# Patient Record
Sex: Male | Born: 1935 | Race: White | Hispanic: No | Marital: Married | Smoking: Former smoker
Health system: Southern US, Community
[De-identification: ages and names within clinical notes are randomized; demographics above are authoritative.]

## PROBLEM LIST (undated history)

## (undated) DIAGNOSIS — M810 Age-related osteoporosis without current pathological fracture: Secondary | ICD-10-CM

## (undated) DIAGNOSIS — R4189 Other symptoms and signs involving cognitive functions and awareness: Secondary | ICD-10-CM

## (undated) DIAGNOSIS — E039 Hypothyroidism, unspecified: Secondary | ICD-10-CM

## (undated) DIAGNOSIS — E079 Disorder of thyroid, unspecified: Secondary | ICD-10-CM

## (undated) HISTORY — PX: CHOLECYSTECTOMY: SHX55

## (undated) HISTORY — PX: HIP FRACTURE SURGERY: SHX118

## (undated) HISTORY — PX: SHOULDER SURGERY: SHX246

---

## 1999-04-02 ENCOUNTER — Encounter: Payer: Self-pay | Admitting: Emergency Medicine

## 1999-04-02 ENCOUNTER — Emergency Department (HOSPITAL_COMMUNITY): Admission: EM | Admit: 1999-04-02 | Discharge: 1999-04-02 | Payer: Self-pay | Admitting: Emergency Medicine

## 1999-04-06 ENCOUNTER — Emergency Department (HOSPITAL_COMMUNITY): Admission: EM | Admit: 1999-04-06 | Discharge: 1999-04-06 | Payer: Self-pay | Admitting: Emergency Medicine

## 2003-07-07 ENCOUNTER — Emergency Department (HOSPITAL_COMMUNITY): Admission: EM | Admit: 2003-07-07 | Discharge: 2003-07-07 | Payer: Self-pay | Admitting: Emergency Medicine

## 2011-12-04 DIAGNOSIS — C4490 Unspecified malignant neoplasm of skin, unspecified: Secondary | ICD-10-CM | POA: Insufficient documentation

## 2011-12-04 DIAGNOSIS — D580 Hereditary spherocytosis: Secondary | ICD-10-CM | POA: Insufficient documentation

## 2012-09-27 DIAGNOSIS — E039 Hypothyroidism, unspecified: Secondary | ICD-10-CM | POA: Insufficient documentation

## 2012-09-27 DIAGNOSIS — E559 Vitamin D deficiency, unspecified: Secondary | ICD-10-CM | POA: Insufficient documentation

## 2012-09-27 DIAGNOSIS — M810 Age-related osteoporosis without current pathological fracture: Secondary | ICD-10-CM | POA: Insufficient documentation

## 2013-01-20 DIAGNOSIS — B351 Tinea unguium: Secondary | ICD-10-CM | POA: Insufficient documentation

## 2013-11-01 DIAGNOSIS — M8440XA Pathological fracture, unspecified site, initial encounter for fracture: Secondary | ICD-10-CM | POA: Insufficient documentation

## 2014-08-08 DIAGNOSIS — Z860100 Personal history of colon polyps, unspecified: Secondary | ICD-10-CM | POA: Insufficient documentation

## 2014-11-06 DIAGNOSIS — Z8781 Personal history of (healed) traumatic fracture: Secondary | ICD-10-CM | POA: Insufficient documentation

## 2015-06-19 DIAGNOSIS — N401 Enlarged prostate with lower urinary tract symptoms: Secondary | ICD-10-CM | POA: Insufficient documentation

## 2015-06-19 DIAGNOSIS — E782 Mixed hyperlipidemia: Secondary | ICD-10-CM | POA: Insufficient documentation

## 2015-09-17 ENCOUNTER — Ambulatory Visit: Payer: Medicare Other | Attending: Orthopedic Surgery | Admitting: Physical Therapy

## 2015-09-17 DIAGNOSIS — R293 Abnormal posture: Secondary | ICD-10-CM | POA: Insufficient documentation

## 2015-09-17 DIAGNOSIS — D580 Hereditary spherocytosis: Secondary | ICD-10-CM | POA: Diagnosis present

## 2015-09-17 DIAGNOSIS — R269 Unspecified abnormalities of gait and mobility: Secondary | ICD-10-CM | POA: Diagnosis present

## 2015-09-17 DIAGNOSIS — R262 Difficulty in walking, not elsewhere classified: Secondary | ICD-10-CM

## 2015-09-17 DIAGNOSIS — M25552 Pain in left hip: Secondary | ICD-10-CM | POA: Diagnosis present

## 2015-09-17 DIAGNOSIS — Z96642 Presence of left artificial hip joint: Secondary | ICD-10-CM | POA: Diagnosis present

## 2015-09-17 DIAGNOSIS — R6889 Other general symptoms and signs: Secondary | ICD-10-CM | POA: Diagnosis present

## 2015-09-17 NOTE — Therapy (Signed)
[facility] [place], [place], [address] Phone: [phone]   Fax:  [phone]  Physical Therapy Evaluation  Patient Details  Name: Eric Larsen MRN: [id number] Date of Birth: 03-Feb-1931 Referring Provider: Marchia Bond MD  Encounter Date: 09/17/2015      PT End of Session - 09/17/15 1327    Visit Number 1   Number of Visits 16   Date for PT Re-Evaluation 11/12/15   Authorization Type BCBS Medicare   Authorization Time Period 11-12-15   PT Start Time 1017   PT Stop Time 1100   PT Time Calculation (min) 43 min   Activity Tolerance Patient tolerated treatment well   Behavior During Therapy [facility] for tasks assessed/performed      No past medical history on file.  No past surgical history on file.  There were no vitals filed for this visit.  Visit Diagnosis:  Spherocytosis (familial) ([place])  H/O total hip arthroplasty, left  Left hip pain  Difficulty walking up stairs  Abnormality of gait  Abnormal posture  Activity intolerance      Subjective Assessment - 09/17/15 1029    Subjective I fell on Aug 24, 2015  with subsequent fracture of Left humeral head and left  neck of femur.  I had no broken bone in my lift until this.     Pertinent History L THA on 08-25-15  and ORIF of Left shoulder on 08-25-15/ No order for shoulder for 6 weeks.    How long can you sit comfortably? 30 min   How long can you stand comfortably? 10  min   How long can you walk comfortably? 10 min   Diagnostic tests x ray, MRI   Patient Stated Goals I would like to walk without a limp and without an assistive device , be able to get up and down form chair   Currently in Pain? Yes   Pain Score 5   when moving otherwise 0/10   Pain Location Hip   Pain Orientation Left   Pain Descriptors / Indicators Aching   Pain Type Surgical pain   Pain Onset 1 to 4 weeks ago   Pain Frequency Occasional   Aggravating Factors  ADL's, sleeping  , moving the hip   Pain Relieving Factors rest   Multiple Pain Sites Yes   Pain Score 6  no order for shoulder   Pain Location Shoulder   Pain Orientation Left   Aggravating Factors  moving hip            [facility] PT Assessment - 09/17/15 1036    Assessment   Medical Diagnosis Left THA and Left Humeral head fx with ORIF   Referring Provider Marchia Bond MD   Onset Date/Surgical Date 08/25/15  surgery for both hip and left shoulder   Hand Dominance Right   [facility] in [facility]   Precautions   Precautions Posterior Hip   Restrictions   Weight Bearing Restrictions Yes   LLE Weight Bearing Weight bearing as tolerated   Balance Screen   Has the patient fallen in the past 6 months Yes   How many times? 1  [place]   Has the patient had a decrease in activity level because of a fear of falling?  Yes   Is the patient reluctant to leave their home because of a fear of falling?  No  Home Environment   Living Environment Private residence   Living Arrangements Spouse/significant other   Home Access Stairs to enter   Entrance Stairs-Number of Steps 3   Entrance Stairs-Rails Right   [place] Two level  bed and bath downsteps   Prior Function   Level of Independence Independent   Cognition   Overall Cognitive Status Within Functional Limits for tasks assessed   Observation/Other Assessments   Lower Extremity Functional Scale  19/80 or 76.2 limitation   Posture/Postural Control   Posture/Postural Control Postural limitations   Postural Limitations Flexed trunk;Posterior pelvic tilt;Rounded Shoulders;Forward head;Increased thoracic kyphosis;Decreased lumbar lordosis   AROM   Right Hip Flexion 110   Right Hip External Rotation  35   Right Hip Internal Rotation  25   Left Hip Flexion 88   Left Hip External Rotation  20  painful and liimited   Left Hip Internal  Rotation  0  painful and liimited   Right Knee Extension 5   Right Knee Flexion 140   Left Knee Extension 13   Left Knee Flexion 88   Strength   Right Hip Flexion 4/5   Right Hip Extension 4-/5   Right Hip ABduction 4-/5   Left Hip Flexion 3-/5   Left Hip Extension 3-/5   Left Hip ABduction 2/5   Right Knee Flexion 4-/5   Right Knee Extension 4-/5   Left Knee Flexion 3-/5   Left Knee Extension 3-/5   Flexibility   Hamstrings right 64, left 50    Palpation   Palpation comment pt with tenderness over surgical site on left posterior total hip   Transfers   Transfers Sit to Stand  bears right on sit to stand   Ambulation/Gait   Ambulation/Gait Yes   Ambulation/Gait Assistance 6: Modified independent (Device/Increase time)   Assistive device Hemi-walker   Gait Pattern Decreased weight shift to right;Decreased hip/knee flexion - left;Decreased step length - right;Decreased stance time - left   Gait velocity 1.17 ft/sec       Posture education for sitting and standing There Ex Eccentric SLR with left LE with wife's assistance and education and return demo for wife. 10 x  1 Heel slides with AAROM 10 x Quad set with towel under knee 10 x  sitting hamstring stretch LAQ x 10 , pt unable to complete Full AROM with knee lag                      PT Education - 09/17/15 1319    Education provided Yes   Education Details POC, Explanation of findings, initial HEP with sitting hamstiring.  Instruction on how wife can assist with exericise posture sitting and standing   Person(s) Educated Patient;Spouse   Methods Explanation;Demonstration;Tactile cues;Verbal cues;Handout   Comprehension Verbalized understanding;Returned demonstration;Need further instruction          PT Short Term Goals - 09/17/15 1341    PT SHORT TERM GOAL #1   Title "Independent with initial HEP   Time 4   Period Weeks   Status New   PT SHORT TERM GOAL #2   Title "Demonstrate and verbalize  understanding of condition management including RICE, positioning, use of A.D., HEP.    Time 4   Period Weeks   Status New   PT SHORT TERM GOAL #3   Title "Demonstrate understanding of proper sitting posture, body mechanics, work ergonomics, and be more conscious of position and posture throughout the day   Time 4  Period Weeks   Status New   PT SHORT TERM GOAL #4   Title Pt will decreased pain in left hip from 5/10 to 2/10 with movement   Time 4   Period Weeks   Status New   PT SHORT TERM GOAL #5   Title Pt will demonstrate 50% greater ease in rising from chair   Time 4   Period Weeks           PT Long Term Goals - 09/17/15 1343    PT LONG TERM GOAL #1   Title "Pt will be independent with advanced HEP.    Time 8   Period Weeks   Status New   PT LONG TERM GOAL #2   Title Pt will be able to negotiate stairs with LRAD and no increase in pain   Time 8   Period Weeks   Status New   PT LONG TERM GOAL #3   Title "Pt will tolerate standing and walking for 90 minutes  without increased pain in order to return to PLOF    Time 8   Period Weeks   Status New   PT LONG TERM GOAL #4   Title Pt will be at 1/10 pain or less with all functional activities and ADL's with left hip   Time 8   Period Weeks   Status New   PT LONG TERM GOAL #5   Title LEFS will improve to at least 50 % limtation or40/80   Baseline 19/80 or 76.2 % limitation   PT LONG TERM GOAL #6   Title Gait velocity will improve from 1.17 ft/sec to at least 2.62 ft/sec to show improved mobiility at community level   Time 8   Period Weeks   Status New               Plan - 09/17/15 1334    Clinical Impression Statement Pt is a  82  year old male s/p  THA on   1 /  7 /17 by MD in [facility] after a fall.  Pt underwent THA and ORIF for Left shoulder humeral head fx.inwhich he wears a sling and is immobile for 6 weeks. (NO order for LEft shoulder - to be assessed when order and MD deems ready.   Dr. Marchia Bond is not resuming care for Mr. Code in [place].  Pt had Posterior approach with precautians and WBAT with Hemi walker. Pt presents with impairments including pain, knee weakness, decreased ROM in hips and kneees, difficulty with walking, stairs, and with transfers . Pt would benefit from skilled PT for 2 times a week for 8 weeks to address above impariments and functional limitations and return to pain-free PLOF.   Pt will benefit from skilled therapeutic intervention in order to improve on the following deficits Abnormal gait;Decreased activity tolerance;Decreased balance;Decreased strength;Decreased mobility;Increased edema;Postural dysfunction;Improper body mechanics;Pain;Increased muscle spasms;Decreased range of motion;Difficulty walking;Decreased endurance   Rehab Potential Good   PT Frequency 2x / week   PT Duration 8 weeks   PT Treatment/Interventions ADLs/Self Care Home Management;Cryotherapy;Electrical Stimulation;Iontophoresis 4mg /ml Dexamethasone;Moist Heat;Therapeutic exercise;Functional mobility training;Stair training;Gait training;Neuromuscular re-education;Patient/family education;Manual techniques;Passive range of motion   PT Next Visit Plan Basic hip AROM, Pt with SLR weakness check, bridge as able,  has restricted IR/ER do PROM to loosen within pain limitation. Pt with left shoulder with humeral head fx with ORIF on same day as THA 08-23-14 . Pt is NWB in left arm and has not order yet  PT Home Exercise Plan Eccentric SLR with wife's help.  quad set, LAQ, heel slide, POsture sitting hamstring stretch   Consulted and Agree with Plan of Care Patient;Family member/caregiver          G-Codes - 2015/09/27 1331    Functional Assessment Tool Used LEFS  19/80 or 76.2% limitation   Functional Limitation Mobility: Walking and moving around   Mobility: Walking and Moving Around Current Status [phone]) At least 60 percent but less than 80 percent impaired, limited or  restricted   Mobility: Walking and Moving Around Goal Status [phone]) At least 20 percent but less than 40 percent impaired, limited or restricted       Problem List Patient Active Problem List   Diagnosis Date Noted  . Spherocytosis (familial) ([place]) 2015-09-27    Voncille Lo, PT 09/27/2015 1:52 PM Phone: [phone] Fax: [phone]  By signing I understand that I am ordering/authorizing the use of Iontophoresis using 4 mg/mL of dexamethasone as a component of this plan of care. [address] [place], [place], [address] Phone: [phone]   Fax:  [phone]  Name: Eric Larsen MRN: [id number] Date of Birth: [birth year]

## 2015-09-17 NOTE — Patient Instructions (Signed)
Leg Extension (Hamstring)   Sit toward front edge of chair, with leg out straight, heel on floor, toes pointing toward body. Keeping back straight, bend forward at hip, breathing out through pursed lips. Return, breathing in. Repeat _2-3__ times. Repeat with other leg. Do 3___ sessions per day. Variation: Perform from standing position, with support.  This is the "kiss the baby exericise" as shown in clinic.   Eccentric Straight leg raise as shown in clinic with wife [name].         Heel Slide   Bend knee and pull heel toward buttocks. Hold _3___ seconds. Return. Repeat with other knee. Repeat _10 x2 ___ times. Do __2-3__ sessions per day.  http://gt2.exer.us/372   Copyright  VHI. All rights reserved.     Raise leg until knee is straight. 10__ reps per set, __3_ sets per day, _7__ days per week     Copyright  VHI. All rights reserved.  Quad Set   Slowly tighten muscles on thigh of straight leg while counting out loud to _5___. Repeat with other leg. Repeat _30___ times. Do _3__ sessions per day.  http://gt2.exer.us/361   Copyright  VHI. All rights reserved.  Posture Tips DO: - stand tall and erect - keep chin tucked in - keep head and shoulders in alignment - check posture regularly in mirror or large window - pull head back against headrest in car seat;  Change your position often.  Sit with lumbar support. DON'T: - slouch or slump while watching TV or reading - sit, stand or lie in one position  for too long;  Sitting is especially hard on the spine so if you sit at a desk/use the computer, then stand up often!   Copyright  VHI. All rights reserved.  Posture - Standing   Good posture is important. Avoid slouching and forward head thrust. Maintain curve in low back and align ears over shoul- ders, hips over ankles.  Pull your belly button in toward your back bone.  Stand with even weight in your feet and ribs liftted up and chin down.  Not military.     Copyright  VHI. All rights reserved.  Posture - Sitting   Sit upright, head facing forward. Try using a roll to support lower back. Keep shoulders relaxed, and avoid rounded back. Keep hips level with knees. Avoid crossing legs for long periods. Sit on sit bones not tail bone.   Copyright  VHI. All rights reserved. Voncille Lo, PT 09/17/2015 10:58 AM Phone: [phone] Fax: [phone]

## 2015-09-18 ENCOUNTER — Ambulatory Visit: Payer: Medicare Other | Attending: Orthopedic Surgery | Admitting: Physical Therapy

## 2015-09-18 DIAGNOSIS — D580 Hereditary spherocytosis: Secondary | ICD-10-CM | POA: Insufficient documentation

## 2015-09-18 DIAGNOSIS — R6889 Other general symptoms and signs: Secondary | ICD-10-CM | POA: Diagnosis present

## 2015-09-18 DIAGNOSIS — R269 Unspecified abnormalities of gait and mobility: Secondary | ICD-10-CM | POA: Diagnosis present

## 2015-09-18 DIAGNOSIS — M25552 Pain in left hip: Secondary | ICD-10-CM | POA: Insufficient documentation

## 2015-09-18 DIAGNOSIS — Z7409 Other reduced mobility: Secondary | ICD-10-CM | POA: Diagnosis present

## 2015-09-18 DIAGNOSIS — R262 Difficulty in walking, not elsewhere classified: Secondary | ICD-10-CM | POA: Diagnosis present

## 2015-09-18 DIAGNOSIS — Z96642 Presence of left artificial hip joint: Secondary | ICD-10-CM | POA: Insufficient documentation

## 2015-09-18 DIAGNOSIS — M25512 Pain in left shoulder: Secondary | ICD-10-CM | POA: Diagnosis present

## 2015-09-18 DIAGNOSIS — M7582 Other shoulder lesions, left shoulder: Secondary | ICD-10-CM | POA: Insufficient documentation

## 2015-09-18 DIAGNOSIS — R293 Abnormal posture: Secondary | ICD-10-CM | POA: Diagnosis present

## 2015-09-18 NOTE — Therapy (Signed)
[facility] [place], [place], [address] Phone: [phone]   Fax:  [phone]  Physical Therapy Treatment  Patient Details  Name: Youcef Vanhousen MRN: [id number] Date of Birth: 1930-09-24 Referring Provider: Marchia Bond MD  Encounter Date: 09/18/2015      PT End of Session - 09/18/15 1643    Visit Number 2   Number of Visits 16   Date for PT Re-Evaluation 11/12/15   Authorization Type BCBS Medicare   Authorization Time Period 11-12-15   PT Start Time 0345   PT Stop Time 0439   PT Time Calculation (min) 54 min      No past medical history on file.  No past surgical history on file.  There were no vitals filed for this visit.  Visit Diagnosis:  Spherocytosis (familial) ([place])  H/O total hip arthroplasty, left  Left hip pain  Difficulty walking up stairs  Abnormality of gait  Abnormal posture      Subjective Assessment - 09/18/15 1651    Subjective I can feel it now that I have been doing the exercises                       [facility] Adult PT Treatment/Exercise - 09/18/15 0001    Exercises   Exercises Knee/Hip   Knee/Hip Exercises: Stretches   Active Hamstring Stretch 3 reps;30 seconds   Active Hamstring Stretch Limitations bilateral   Knee/Hip Exercises: Seated   Long Arc Quad 10 reps;2 sets   Knee/Hip Exercises: Supine   Quad Sets Left;10 reps   Short Arc Quad Sets Left;20 reps   Heel Slides 2 sets;10 reps   Heel Slides Limitations needs verbal and tactile cues to maintain neutral hip   Straight Leg Raises 5 reps;AAROM   Straight Leg Raises Limitations needs assist to contriol pain- wie returned demonstration   Other Supine Knee/Hip Exercises ball squeeze x10   Manual Therapy   Manual Therapy Passive ROM   Passive ROM hip/knee flexion, abduction                PT Education - 09/18/15 1643    Education provided Yes   Education Details Bridging, ball squeeze     Person(s) Educated Patient   Methods Explanation;Handout   Comprehension Verbalized understanding          PT Short Term Goals - 09/17/15 1341    PT SHORT TERM GOAL #1   Title "Independent with initial HEP   Time 4   Period Weeks   Status New   PT SHORT TERM GOAL #2   Title "Demonstrate and verbalize understanding of condition management including RICE, positioning, use of A.D., HEP.    Time 4   Period Weeks   Status New   PT SHORT TERM GOAL #3   Title "Demonstrate understanding of proper sitting posture, body mechanics, work ergonomics, and be more conscious of position and posture throughout the day   Time 4   Period Weeks   Status New   PT SHORT TERM GOAL #4   Title Pt will decreased pain in left hip from 5/10 to 2/10 with movement   Time 4   Period Weeks   Status New   PT SHORT TERM GOAL #5   Title Pt will demonstrate 50% greater ease in rising from chair   Time 4   Period Weeks           PT Long Term Goals - 09/17/15 1343  PT LONG TERM GOAL #1   Title "Pt will be independent with advanced HEP.    Time 8   Period Weeks   Status New   PT LONG TERM GOAL #2   Title Pt will be able to negotiate stairs with LRAD and no increase in pain   Time 8   Period Weeks   Status New   PT LONG TERM GOAL #3   Title "Pt will tolerate standing and walking for 90 minutes  without increased pain in order to return to PLOF    Time 8   Period Weeks   Status New   PT LONG TERM GOAL #4   Title Pt will be at 1/10 pain or less with all functional activities and ADL's with left hip   Time 8   Period Weeks   Status New   PT LONG TERM GOAL #5   Title LEFS will improve to at least 50 % limtation or40/80   Baseline 19/80 or 76.2 % limitation   PT LONG TERM GOAL #6   Title Gait velocity will improve from 1.17 ft/sec to at least 2.62 ft/sec to show improved mobiility at community level   Time 8   Period Weeks   Status New               Plan - 09/18/15 1643     Clinical Impression Statement review of pt's HEP. He has difficulty maintaining ankle, knee, hip in neutral for heel slides. C/o anterior tib pain. Instructed in ankle circles and AROM for left ankle. IR rolls while lying supine. Mutiple reps progressing with assist to maintain neutral with heel slides to no assist needed. Wife educated to watch his alignment and verbalizes understanding. Able to add brides and ball squeeze to HEP. 6/10 pain with assisted SLR so instructed wife to assist enough to keep pain 3-4/10 or discontinue and focus heel slides unassisted. Pt and wife verbalize understanding. Gentle PROM for hip flexion and adduction.    PT Next Visit Plan Basic hip AROM, Pt with SLR weakness, review bridge and ball squeeze, formally added ankle AROM to HEP,  has restricted IR/ER do PROM to loosen within pain limitation. Pt with left shoulder with humeral head fx with ORIF on same day as THA 08-23-14 . Pt is NWB in left arm and has not order yet          G-Codes - 15-Oct-2015 1331    Functional Assessment Tool Used LEFS  19/80 or 76.2% limitation   Functional Limitation Mobility: Walking and moving around   Mobility: Walking and Moving Around Current Status [phone]) At least 60 percent but less than 80 percent impaired, limited or restricted   Mobility: Walking and Moving Around Goal Status [phone]) At least 20 percent but less than 40 percent impaired, limited or restricted      Problem List Patient Active Problem List   Diagnosis Date Noted  . Spherocytosis (familial) ([place]) 10-15-2015    Dorene Ar, PTA 09/18/2015, 4:52 PM  [facility] [address] [place], [place], [address] Phone: [phone]   Fax:  [phone]  Name: Jerrett Durling MRN: [id number] Date of Birth: Aug 23, 1930

## 2015-09-18 NOTE — Patient Instructions (Signed)
Bridging    Slowly raise buttocks from floor, keeping buttocks and stomach tight. Repeat _10___ times per set. Do __2__ sets per session. Do ___2_ sessions per day.  Isometric Hip Adduction    With towel rolled between knees, press thighs together. Hold __5__ seconds while counting out loud. Repeat _10-20___ times. Do __2-3__ sessions per day.

## 2015-09-19 DIAGNOSIS — Z96642 Presence of left artificial hip joint: Secondary | ICD-10-CM | POA: Insufficient documentation

## 2015-09-24 ENCOUNTER — Ambulatory Visit: Payer: Medicare Other | Admitting: Physical Therapy

## 2015-09-24 DIAGNOSIS — R6889 Other general symptoms and signs: Secondary | ICD-10-CM

## 2015-09-24 DIAGNOSIS — R262 Difficulty in walking, not elsewhere classified: Secondary | ICD-10-CM

## 2015-09-24 DIAGNOSIS — D580 Hereditary spherocytosis: Secondary | ICD-10-CM | POA: Diagnosis not present

## 2015-09-24 DIAGNOSIS — Z96642 Presence of left artificial hip joint: Secondary | ICD-10-CM

## 2015-09-24 DIAGNOSIS — M25552 Pain in left hip: Secondary | ICD-10-CM

## 2015-09-24 DIAGNOSIS — R269 Unspecified abnormalities of gait and mobility: Secondary | ICD-10-CM

## 2015-09-24 NOTE — Patient Instructions (Signed)
ROM: Inversion / Eversion   With left leg relaxed, gently turn ankle and foot in and out. Move through full range of motion. Avoid pain. Repeat _10-20___ times per set. Do __2__ sets per session. Do _2___ sessions per day.  http://orth.exer.us/36   Copyright  VHI. All rights reserved.  ROM: Plantar / Dorsiflexion   With left leg relaxed, gently flex and extend ankle. Move through full range of motion. Avoid pain. Repeat __10-20__ times per set. Do __2__ sets per session. Do __2__ sessions per day.   Ankle Circles   Slowly rotate right foot and ankle clockwise then counterclockwise. Gradually increase range of motion. Avoid pain. Circle ___10_ times each direction per set. Do __2__ sets per session. Do 2____ sessions per day.  http://orth.exer.us/30   Copyright  VHI. All rights reserved.  Bridge    Lie back, legs bent. Inhale, pressing hips up. Keeping ribs in, lengthen lower back Hold 5 seconds. Exhale, rolling down along spine from top. Repeat __10__ times. Do 2 sets per session. Do _2___ sessions per day.  External Rotation: Hip - Knees Apart (Hook-Lying)    Lie with hips and knees bent. Feet wide. Pull knees together.  Hold for __5_ seconds.. Repeat _10-20__ times. Do _2__ times a day. Also with feet narrow, pull knees apart 10-20 times 2 times per day  Copyright  VHI. All rights reserved.

## 2015-09-25 NOTE — Therapy (Signed)
[place], [place], [address] Phone: [phone]   Fax:  [phone]  Physical Therapy Treatment  Patient Details  Name: Jonath Derrington MRN: [id number] Date of Birth: [birth year] Referring Provider: Marchia Bond MD  Encounter Date: 09/24/2015      PT End of Session - 09/24/15 1417    Visit Number 3   Number of Visits 16   Date for PT Re-Evaluation 11/12/15   Authorization Type BCBS Medicare   PT Start Time 0215   PT Stop Time 0300   PT Time Calculation (min) 45 min      No past medical history on file.  No past surgical history on file.  There were no vitals filed for this visit.  Visit Diagnosis:  Difficulty walking up stairs  H/O total hip arthroplasty, left  Left hip pain  Abnormality of gait  Activity intolerance      Subjective Assessment - 09/24/15 1557    Subjective Still have some shin pain with walking and the exercises.            [facility] PT Assessment - 09/25/15 0001    AROM   Left Hip Flexion 90   Left Hip External Rotation  25   Left Hip Internal Rotation  10   Left Knee Flexion 115                     [facility] Adult PT Treatment/Exercise - 09/25/15 0001    Knee/Hip Exercises: Seated   Long Arc Quad 10 reps;2 sets   Knee/Hip Exercises: Supine   Quad Sets Left;10 reps   Short Arc Quad Sets Left;20 reps   Heel Slides 2 sets;10 reps   Bridges Limitations x 10    Straight Leg Raises 5 reps;AAROM   Straight Leg Raises Limitations able to lift indelpendently able to lift from 45 degree surface-wedge x 5 times   Other Supine Knee/Hip Exercises long axis hip IR rolls AROm x 15   Other Supine Knee/Hip Exercises wide based feet for IR/ER AROM via clam x 20   Manual Therapy   Passive ROM Passive hip IR/ER at 90 hip flexion/knee flexion, supine, gentle knee to chest                  PT Short Term Goals - 09/24/15 1556    PT SHORT TERM GOAL #1   Title "Independent with initial HEP   Time 4   Period Weeks   Status Achieved   PT SHORT TERM GOAL #2   Title "Demonstrate and verbalize understanding of condition management including RICE, positioning, use of A.D., HEP.    Time 4   Period Weeks   Status Achieved   PT SHORT TERM GOAL #3   Title "Demonstrate understanding of proper sitting posture, body mechanics, work ergonomics, and be more conscious of position and posture throughout the day   Baseline requires cues for posture   Time 4   Period Weeks   Status On-going   PT SHORT TERM GOAL #4   Title Pt will decreased pain in left hip from 5/10 to 2/10 with movement   PT SHORT TERM GOAL #5   Title Pt will demonstrate 50% greater ease in rising from chair   Time 4   Period Weeks   Status Unable to assess           PT Long Term Goals - 09/17/15 1343    PT LONG TERM GOAL #1  Title "Pt will be independent with advanced HEP.    Time 8   Period Weeks   Status New   PT LONG TERM GOAL #2   Title Pt will be able to negotiate stairs with LRAD and no increase in pain   Time 8   Period Weeks   Status New   PT LONG TERM GOAL #3   Title "Pt will tolerate standing and walking for 90 minutes  without increased pain in order to return to PLOF    Time 8   Period Weeks   Status New   PT LONG TERM GOAL #4   Title Pt will be at 1/10 pain or less with all functional activities and ADL's with left hip   Time 8   Period Weeks   Status New   PT LONG TERM GOAL #5   Title LEFS will improve to at least 50 % limtation or40/80   Baseline 19/80 or 76.2 % limitation   PT LONG TERM GOAL #6   Title Gait velocity will improve from 1.17 ft/sec to at least 2.62 ft/sec to show improved mobiility at community level   Time 8   Period Weeks   Status New               Plan - 09/24/15 [id number]    Clinical Impression Statement Pt presents with increased active knee flexion. He is able to perform supine active knee to chest repetitions without  assist. Improved tolerance to heel slides and he can not actively lift LLE from 45 degree bolster independently for SLR. He requires min assist to lift LLE from mat into SLR. Gentle PROM to improve hip IR/ER. Pt continue to c/o anterior tibia pain that is likely related to stifness in left ankle lacking inversion. Added ankle AROM to HEP. Pt now able to perform comfortable active bridge exercise. Added IR/ER AROM via narrow and wide clam shells for HEP.    PT Next Visit Plan Assess left ankle stiffness and shin pain. continue basic knee/hip AROM exercises.         Problem List Patient Active Problem List   Diagnosis Date Noted  . Spherocytosis (familial) ([place]) 09/17/2015    Dorene Ar, PTA 09/25/2015, 8:42 AM  [facility] [address] [place], [place], [address] Phone: [phone]   Fax:  [phone]  Name: Rawlin Antolini MRN: [id number] Date of Birth: 1931/06/16

## 2015-09-26 ENCOUNTER — Ambulatory Visit: Payer: Medicare Other | Admitting: Physical Therapy

## 2015-09-26 DIAGNOSIS — R6889 Other general symptoms and signs: Secondary | ICD-10-CM

## 2015-09-26 DIAGNOSIS — R293 Abnormal posture: Secondary | ICD-10-CM

## 2015-09-26 DIAGNOSIS — Z96642 Presence of left artificial hip joint: Secondary | ICD-10-CM

## 2015-09-26 DIAGNOSIS — M25552 Pain in left hip: Secondary | ICD-10-CM

## 2015-09-26 DIAGNOSIS — D580 Hereditary spherocytosis: Secondary | ICD-10-CM

## 2015-09-26 DIAGNOSIS — R262 Difficulty in walking, not elsewhere classified: Secondary | ICD-10-CM

## 2015-09-26 DIAGNOSIS — R269 Unspecified abnormalities of gait and mobility: Secondary | ICD-10-CM

## 2015-09-26 NOTE — Therapy (Signed)
[place], [place], [address] Phone: [phone]   Fax:  [phone]  Physical Therapy Treatment  Patient Details  Name: Eric Larsen MRN: [id number] Date of Birth: October 12, 1930 Referring Provider: Marchia Bond MD  Encounter Date: 09/26/2015      PT End of Session - 09/26/15 1019    Visit Number 4   Number of Visits 16   Date for PT Re-Evaluation 11/12/15   Authorization Type BCBS Medicare   Authorization Time Period 11-12-15   PT Start Time 1015   PT Stop Time 1100   PT Time Calculation (min) 45 min   Activity Tolerance Patient tolerated treatment well   Behavior During Therapy [facility] for tasks assessed/performed      No past medical history on file.  No past surgical history on file.  There were no vitals filed for this visit.  Visit Diagnosis:  H/O total hip arthroplasty, left  Left hip pain  Difficulty walking up stairs  Abnormality of gait  Activity intolerance  Spherocytosis (familial) (H[facility])  Abnormal posture      Subjective Assessment - 09/26/15 1020    Subjective My shin pain is better today.   Pertinent History L THA on 08-25-15  and ORIF of Left shoulder on 08-25-15/ No order for shoulder for 6 weeks.    How long can you sit comfortably? 2 hour   How long can you stand comfortably? 20 minutes   How long can you walk comfortably? 30 min   Patient Stated Goals I would like to walk without a limp and without an assistive device , be able to get up and down form chair   Currently in Pain? Yes   Pain Score 3   4-5/10   Pain Location Hip   Pain Orientation Left   Pain Descriptors / Indicators Aching   Pain Score 6  no order in sling.  0/10 when not moving   Pain Location Shoulder                         [facility] Adult PT Treatment/Exercise - 09/26/15 1024    Transfers   Transfers Sit to Stand   Sit to Stand 5: Supervision   Sit to Stand Details Tactile cues  for weight shifting   Comments using approximation of left knee   joint 5x  2 sets    Knee/Hip Exercises: Seated   Long Arc Quad 10 reps;1 set   Knee/Hip Exercises: Supine   Quad Sets Left;10 reps   Short Arc Quad Sets Left;20 reps   Heel Slides 2 sets;10 reps   Bridges Limitations --   Straight Leg Raises AAROM;10 reps   Straight Leg Raises Limitations able to lift indelpendently able to lift from 45 degree surface-wedge x 5 times   Other Supine Knee/Hip Exercises --   Other Supine Knee/Hip Exercises --   Manual Therapy   Passive ROM Passive hip IR/ER at 90 hip flexion/knee flexion, supine, gentle knee to chest   Other Manual Therapy PNF bil for LE for knee /hip flex to 90 degrees on left and maximizing range on right D1/D2 x 10 each x 2                PT Education - 09/26/15 1105    Education provided Yes   Education Details sit to stand. ex to HEP and explanation of importance of rest recovery and ROMvs Strengthening exericises post surgery  Person(s) Educated Patient;Spouse   Methods Explanation;Demonstration;Verbal cues;Handout;Tactile cues   Comprehension Verbalized understanding;Returned demonstration          PT Short Term Goals - 09/26/15 1215    PT SHORT TERM GOAL #1   Title "Independent with initial HEP   Time 4   Period Weeks   Status Achieved   PT SHORT TERM GOAL #2   Title "Demonstrate and verbalize understanding of condition management including RICE, positioning, use of A.D., HEP.    Time 4   Period Weeks   Status Achieved   PT SHORT TERM GOAL #3   Title "Demonstrate understanding of proper sitting posture, body mechanics, work ergonomics, and be more conscious of position and posture throughout the day   Baseline requires cues for posture and tactile for sitting in LAQ   Time 4   Period Weeks   Status On-going   PT SHORT TERM GOAL #4   Title Pt will decreased pain in left hip from 5/10 to 2/10 with movement   Baseline Left hip 3/10 today    Time 4   Period Weeks   Status On-going   PT SHORT TERM GOAL #5   Title Pt will demonstrate 50% greater ease in rising from chair   Baseline wife using approximation of left knee while pt sit to stand   Period Weeks   Status On-going           PT Long Term Goals - 09/17/15 1343    PT LONG TERM GOAL #1   Title "Pt will be independent with advanced HEP.    Time 8   Period Weeks   Status New   PT LONG TERM GOAL #2   Title Pt will be able to negotiate stairs with LRAD and no increase in pain   Time 8   Period Weeks   Status New   PT LONG TERM GOAL #3   Title "Pt will tolerate standing and walking for 90 minutes  without increased pain in order to return to PLOF    Time 8   Period Weeks   Status New   PT LONG TERM GOAL #4   Title Pt will be at 1/10 pain or less with all functional activities and ADL's with left hip   Time 8   Period Weeks   Status New   PT LONG TERM GOAL #5   Title LEFS will improve to at least 50 % limtation or40/80   Baseline 19/80 or 76.2 % limitation   PT LONG TERM GOAL #6   Title Gait velocity will improve from 1.17 ft/sec to at least 2.62 ft/sec to show improved mobiility at community level   Time 8   Period Weeks   Status New               Plan - 09/26/15 1222    Clinical Impression Statement Pt is continuing to improve pain at 3/10 today.  Pt requires Assistance with SLR eccentrically but is actively able to raise leg  x 5.  Pt benefitted from AROM of ankle and feels his gait has improved  since adding it.  shin pain was minimal this visit. Will continue to perform exericises and added sit to stand with wife/PT assisting with approximation throught left LE.   Making steady progress. and will continue  with shoulder when order received  by MD.  Pt able to rise sit to stand with imporved wt shift to left after 5 x  .  Pt will benefit from skilled therapeutic intervention in order to improve on the following deficits Abnormal gait;Decreased  activity tolerance;Decreased balance;Decreased strength;Decreased mobility;Increased edema;Postural dysfunction;Improper body mechanics;Pain;Increased muscle spasms;Decreased range of motion;Difficulty walking;Decreased endurance   Rehab Potential Good   PT Frequency 2x / week   PT Duration 8 weeks   PT Treatment/Interventions ADLs/Self Care Home Management;Cryotherapy;Electrical Stimulation;Iontophoresis 4mg /ml Dexamethasone;Moist Heat;Therapeutic exercise;Functional mobility training;Stair training;Gait training;Neuromuscular re-education;Patient/family education;Manual techniques;Passive range of motion   PT Next Visit Plan Assess left ankle stiffness and shin pain.if pain increases,  continue basic knee/hip AROM exercises.    PT Home Exercise Plan Eccentric SLR with wife's help.  quad set, LAQ, heel slide, POsture sitting hamstring stretch and sit to stand   Consulted and Agree with Plan of Care Patient        Problem List Patient Active Problem List   Diagnosis Date Noted  . Spherocytosis (familial) ([place]) 09/17/2015    Voncille Lo, PT 09/26/2015 12:31 PM Phone: [phone] Fax: [facility] [address] [place], [place], [address] Phone: [phone]   Fax:  [phone]  Name: Eric Larsen MRN: [id number] Date of Birth: 1930/11/02

## 2015-09-26 NOTE — Patient Instructions (Signed)
Sit to Stand / Stand to Sit / Transfers    Sit on edge of a solid chair with arms, feet flat on floor. Lean forward over feet and stand up with hands on chair arms. Sit down slowly with hands on chair arms. Have Eric Larsen hold onto left knee to approximating the joint while standing.  Try to use equal weight in both legs.   Do 5 repetitions  Well morning , noon and night.  This is for exercise.  Always use arm to push up for safety but not for this exercise.   Eric Larsen, PT 09/26/2015 11:04 AM Phone: [phone] Fax: [phone]  Repeat ____ times per session. Do ____ sessions per day.  Copyright  VHI. All rights reserved.

## 2015-09-30 ENCOUNTER — Ambulatory Visit: Payer: Medicare Other | Admitting: Physical Therapy

## 2015-09-30 DIAGNOSIS — D580 Hereditary spherocytosis: Secondary | ICD-10-CM | POA: Diagnosis not present

## 2015-09-30 DIAGNOSIS — R6889 Other general symptoms and signs: Secondary | ICD-10-CM

## 2015-09-30 DIAGNOSIS — Z96642 Presence of left artificial hip joint: Secondary | ICD-10-CM

## 2015-09-30 DIAGNOSIS — R262 Difficulty in walking, not elsewhere classified: Secondary | ICD-10-CM

## 2015-09-30 DIAGNOSIS — R269 Unspecified abnormalities of gait and mobility: Secondary | ICD-10-CM

## 2015-09-30 DIAGNOSIS — M25552 Pain in left hip: Secondary | ICD-10-CM

## 2015-09-30 DIAGNOSIS — R293 Abnormal posture: Secondary | ICD-10-CM

## 2015-09-30 NOTE — Patient Instructions (Signed)
Straight Leg Raise: With External Leg Rotation    Lie on back with right leg straight, opposite leg bent. Rotate straight leg out and lift __12__ inches. Repeat __10__ times per set. Do __1-_2_ sets per session. Do __2__ sessions per day.  http://orth.exer.us/728   Copyright  VHI. All rights reserved.

## 2015-09-30 NOTE — Therapy (Signed)
[place], [place], [address] Phone: [phone]   Fax:  [phone]  Physical Therapy Treatment  Patient Details  Name: Eric Larsen MRN: [id number] Date of Birth: July 13, 1931 Referring Provider: Marchia Bond MD  Encounter Date: 09/30/2015      PT End of Session - 09/30/15 1104    Visit Number 5   Number of Visits 16   Date for PT Re-Evaluation 11/12/15   Authorization Type BCBS Medicare   Authorization Time Period 11-12-15   PT Start Time 1102   PT Stop Time 1145   PT Time Calculation (min) 43 min      No past medical history on file.  No past surgical history on file.  There were no vitals filed for this visit.  Visit Diagnosis:  H/O total hip arthroplasty, left  Left hip pain  Difficulty walking up stairs  Abnormality of gait  Activity intolerance  Abnormal posture      Subjective Assessment - 09/30/15 1306    Subjective I took a couple of steps without my hemiwalker before I realized I did not have it.    Currently in Pain? No/denies                         [facility] Adult PT Treatment/Exercise - 09/30/15 0001    Knee/Hip Exercises: Stretches   Active Hamstring Stretch 2 reps;30 seconds   Passive Hamstring Stretch 3 reps;30 seconds   Knee/Hip Exercises: Seated   Long Arc Quad 10 reps;1 set   Sit to [employer] 10 reps  without UE and left foot back   Knee/Hip Exercises: Supine   Short Arc Quad Sets Left;20 reps   Heel Slides 2 sets;10 reps   Bridges Limitations x 10   cues to place most weight on LLE- harder, no pain   Single Leg Bridge --  unable    Straight Leg Raises AAROM;10 reps   Straight Leg Raises Limitations able to lift indelpendently able to lift from 45 degree surface-wedge x 10    Straight Leg Raise with External Rotation AROM;Left;10 reps from wedge    Clams with feet wide and feet x 20 each            PT Education - 09/30/15 1144    Education provided Yes   Education Details SLR with ER   Person(s) Educated Patient   Methods Explanation;Handout   Comprehension Verbalized understanding          PT Short Term Goals - 09/26/15 1215    PT SHORT TERM GOAL #1   Title "Independent with initial HEP   Time 4   Period Weeks   Status Achieved   PT SHORT TERM GOAL #2   Title "Demonstrate and verbalize understanding of condition management including RICE, positioning, use of A.D., HEP.    Time 4   Period Weeks   Status Achieved   PT SHORT TERM GOAL #3   Title "Demonstrate understanding of proper sitting posture, body mechanics, work ergonomics, and be more conscious of position and posture throughout the day   Baseline requires cues for posture and tactile for sitting in LAQ   Time 4   Period Weeks   Status On-going   PT SHORT TERM GOAL #4   Title Pt will decreased pain in left hip from 5/10 to 2/10 with movement   Baseline Left hip 3/10 today   Time 4   Period Weeks   Status  On-going   PT SHORT TERM GOAL #5   Title Pt will demonstrate 50% greater ease in rising from chair   Baseline wife using approximation of left knee while pt sit to stand   Period Weeks   Status On-going           PT Long Term Goals - 09/17/15 1343    PT LONG TERM GOAL #1   Title "Pt will be independent with advanced HEP.    Time 8   Period Weeks   Status New   PT LONG TERM GOAL #2   Title Pt will be able to negotiate stairs with LRAD and no increase in pain   Time 8   Period Weeks   Status New   PT LONG TERM GOAL #3   Title "Pt will tolerate standing and walking for 90 minutes  without increased pain in order to return to PLOF    Time 8   Period Weeks   Status New   PT LONG TERM GOAL #4   Title Pt will be at 1/10 pain or less with all functional activities and ADL's with left hip   Time 8   Period Weeks   Status New   PT LONG TERM GOAL #5   Title LEFS will improve to at least 50 % limtation or40/80   Baseline 19/80 or  76.2 % limitation   PT LONG TERM GOAL #6   Title Gait velocity will improve from 1.17 ft/sec to at least 2.62 ft/sec to show improved mobiility at community level   Time 8   Period Weeks   Status New               Plan - 09/30/15 1307    Clinical Impression Statement Pt demonstrates weakness in medial quad and adductors as pt has difficulty keeping LLE from drifting into abduction during SLR/ heel slides. Cues to make contact with other leg during these exercises helps him to maintain neutral alignment as well as adding SLR with external rotation.  He has been practicing sit-stand transfers without UE asssit however noted he continues to shift weight mostly to right side. After cues for foot placement and explination about compensation, pt is able to perform with equal wight and with more weight on his affected side. It is also noted that he is using mostly the right LE during supine bridges he can make it more challenging by focusing more weight through LLE. He is unable to perform a full ROM L single bridge.   PT Next Visit Plan Assess left ankle stiffness and shin pain.if pain increases,  continue basic knee/hip AROM exercises.         Problem List Patient Active Problem List   Diagnosis Date Noted  . Spherocytosis (familial) ([place]) 09/17/2015    Dorene Ar, PTA 09/30/2015, 1:13 PM  [facility] [address] [place], [place], [address] Phone: [phone]   Fax:  [phone]  Name: Eric Larsen MRN: [id number] Date of Birth: [birth year]

## 2015-10-03 ENCOUNTER — Ambulatory Visit: Payer: Medicare Other | Admitting: Physical Therapy

## 2015-10-03 DIAGNOSIS — M25552 Pain in left hip: Secondary | ICD-10-CM

## 2015-10-03 DIAGNOSIS — Z96642 Presence of left artificial hip joint: Secondary | ICD-10-CM

## 2015-10-03 DIAGNOSIS — R269 Unspecified abnormalities of gait and mobility: Secondary | ICD-10-CM

## 2015-10-03 DIAGNOSIS — R6889 Other general symptoms and signs: Secondary | ICD-10-CM

## 2015-10-03 DIAGNOSIS — R262 Difficulty in walking, not elsewhere classified: Secondary | ICD-10-CM

## 2015-10-03 DIAGNOSIS — R293 Abnormal posture: Secondary | ICD-10-CM

## 2015-10-03 DIAGNOSIS — D580 Hereditary spherocytosis: Secondary | ICD-10-CM | POA: Diagnosis not present

## 2015-10-03 NOTE — Therapy (Signed)
[place], [place], [address] Phone: [phone]   Fax:  [phone]  Physical Therapy Treatment  Patient Details  Name: Eric Larsen MRN: [id number] Date of Birth: 12-04-1930 Referring Provider: Marchia Bond MD  Encounter Date: 10/03/2015      PT End of Session - 10/03/15 1525    Visit Number 6   Number of Visits 16   Date for PT Re-Evaluation 11/12/15   Authorization Type BCBS Medicare   PT Start Time 0300   PT Stop Time 0345   PT Time Calculation (min) 45 min      No past medical history on file.  No past surgical history on file.  There were no vitals filed for this visit.  Visit Diagnosis:  H/O total hip arthroplasty, left  Left hip pain  Difficulty walking up stairs  Abnormality of gait  Activity intolerance  Abnormal posture      Subjective Assessment - 10/03/15 1616    Currently in Pain? No/denies                         [facility] Adult PT Treatment/Exercise - 10/03/15 0001    Ambulation/Gait   Stairs Yes   Stairs Assistance 6: Modified independent (Device/Increase time)   Stair Management Technique Alternating pattern   Number of Stairs 12   Height of Stairs 6   Gait Comments Pt able to perform reciprocal stairs ascending and descending with good safety and no pain   Knee/Hip Exercises: Aerobic   Nustep L3 x 5 minutes LE only   Knee/Hip Exercises: Standing   Heel Raises 20 reps   Forward Step Up 1 set;10 reps;Left;Hand Hold: 1   SLS Left with R UE support x 60 seconds- decreased motor control    Other Standing Knee Exercises 3 way hip x 10 x 2 with 5 second holds   Knee/Hip Exercises: Seated   Long Arc Quad 2 sets;10 reps   Long Arc Quad Weight 2 lbs.   Sit to Sand 10 reps  without UE and left foot back   Knee/Hip Exercises: Supine   Straight Leg Raises Left;1 set;10 reps   Straight Leg Raises Limitations able to left from mat   Straight Leg  Raise with External Rotation AROM;Left;10 reps   Straight Leg Raise with External Rotation Limitations able to lift from mat                PT Education - 10/03/15 1617    Education provided Yes   Education Details Standing 3 way hip , SLR, heel raises, step up          PT Short Term Goals - 10/03/15 1611    PT SHORT TERM GOAL #1   Title "Independent with initial HEP   Time 4   Period Weeks   Status Achieved   PT SHORT TERM GOAL #2   Title "Demonstrate and verbalize understanding of condition management including RICE, positioning, use of A.D., HEP.    Status Achieved   PT SHORT TERM GOAL #3   Title "Demonstrate understanding of proper sitting posture, body mechanics, work ergonomics, and be more conscious of position and posture throughout the day   Baseline requires cues for posture and tactile for sitting in LAQ   Time 4   Period Weeks   Status On-going   PT SHORT TERM GOAL #4   Title Pt will decreased pain in left hip from 5/10 to  2/10 with movement   Time 4   Period Weeks   Status Achieved   PT SHORT TERM GOAL #5   Title Pt will demonstrate 50% greater ease in rising from chair   Time 4   Period Weeks   Status Achieved           PT Long Term Goals - 10/03/15 1612    PT LONG TERM GOAL #1   Title "Pt will be independent with advanced HEP.    Time 8   Period Weeks   Status On-going   PT LONG TERM GOAL #2   Title Pt will be able to negotiate stairs with LRAD and no increase in pain   Time 8   Period Weeks   Status Achieved   PT LONG TERM GOAL #3   Title "Pt will tolerate standing and walking for 90 minutes  without increased pain in order to return to PLOF    Time 8   Period Weeks   Status Unable to assess   PT LONG TERM GOAL #4   Title Pt will be at 1/10 pain or less with all functional activities and ADL's with left hip   Baseline most of the time   Time 8   Period Weeks   Status Partially Met   PT LONG TERM GOAL #5   Title LEFS will  improve to at least 50 % limtation or40/80   Baseline 19/80 or 76.2 % limitation   Status Unable to assess   PT LONG TERM GOAL #6   Title Gait velocity will improve from 1.17 ft/sec to at least 2.62 ft/sec to show improved mobiility at community level   Time 8   Period Weeks   Status Unable to assess               Plan - 10/03/15 1608    Clinical Impression Statement Pt demonstrates ability to perform SLR without assist and as well as SLR with ER 10 reps each. Began Nustep for LE strengthening. Instructed in Standing 4 way hip for left hip strength as well as 60 second SLS LLE with UE support. Pt also navigated reciprocal stairs in clinic with good safety. Began forward 6 inch step ups for strengthening. Pt to bring [facility] next visit for gait training and safety. STG#4, #5 MET, LTG#2 MET.    PT Next Visit Plan gait with SPC, gait velocity, continue closed chain strength and balance, streamline HEP as able.         Problem List Patient Active Problem List   Diagnosis Date Noted  . Spherocytosis (familial) ([place]) 09/17/2015    Dorene Ar , PTA  10/03/2015, 4:17 PM  [facility] [address] [place], [place], [address] Phone: [phone]   Fax:  [phone]  Name: General Wearing MRN: [id number] Date of Birth: 11-14-1930

## 2015-10-03 NOTE — Patient Instructions (Signed)
Knee High   Holding stable object, raise knee to hip level, then lower knee. Repeat with other knee. Complete __10_ repetitions. Do __2__ sessions per day.  ABDUCTION: Standing (Active)   Stand, feet flat. Lift right leg out to side. Use _0__ lbs. Complete __10_ repetitions. Perform __2_ sessions per day.      EXTENSION: Standing (Active)  Stand, both feet flat. Draw right leg behind body as far as possible. Use 0___ lbs. Complete 10 repetitions. Perform __2_ sessions per day.   Forward   Facing step, place one leg on step, flexed at hip. Step up slowly, bringing hips in line with knee and shoulder. Bring other foot onto step. Reverse process to step back down. Repeat with other leg. Do _10___ repetitions, __2__ sets.  Heel Raise: Bilateral (Standing)    Rise on balls of feet. Repeat __10__ times per set. Do _2___ sets per session. Do ___2_ sessions per day.  SINGLE LIMB STANCE    Stance: single leg on floor. Raise leg. Hold _60__ seconds. HOLD ON TO COUNTER WITH RIGHT HAND _1__ reps per set, _1__ sets per day, __7_ days per week  Copyright  VHI. All rights reserved.

## 2015-10-07 ENCOUNTER — Ambulatory Visit: Payer: Medicare Other | Admitting: Physical Therapy

## 2015-10-07 ENCOUNTER — Telehealth: Payer: Self-pay | Admitting: Physical Therapy

## 2015-10-07 DIAGNOSIS — M25552 Pain in left hip: Secondary | ICD-10-CM

## 2015-10-07 DIAGNOSIS — R6889 Other general symptoms and signs: Secondary | ICD-10-CM

## 2015-10-07 DIAGNOSIS — R293 Abnormal posture: Secondary | ICD-10-CM

## 2015-10-07 DIAGNOSIS — D580 Hereditary spherocytosis: Secondary | ICD-10-CM | POA: Diagnosis not present

## 2015-10-07 DIAGNOSIS — Z96642 Presence of left artificial hip joint: Secondary | ICD-10-CM

## 2015-10-07 DIAGNOSIS — R269 Unspecified abnormalities of gait and mobility: Secondary | ICD-10-CM

## 2015-10-07 DIAGNOSIS — R262 Difficulty in walking, not elsewhere classified: Secondary | ICD-10-CM

## 2015-10-07 NOTE — Therapy (Signed)
[place], [place], [address] Phone: [phone]   Fax:  [phone]  Physical Therapy Treatment  Patient Details  Name: Eric Larsen MRN: [id number] Date of Birth: 1930/11/06 Referring Provider: Marchia Bond MD  Encounter Date: 10/07/2015      PT End of Session - 10/07/15 1521    Visit Number 7  30 minutes late,  got time wrong   Number of Visits 16   Date for PT Re-Evaluation 11/12/15   Authorization Type BCBS Medicare   Authorization Time Period 11-12-15   PT Start Time 0200   PT Stop Time 0217   PT Time Calculation (min) 17 min   Equipment Utilized During Treatment Gait belt   Activity Tolerance Patient tolerated treatment well   Behavior During Therapy [facility] for tasks assessed/performed      No past medical history on file.  No past surgical history on file.  There were no vitals filed for this visit.  Visit Diagnosis:  H/O total hip arthroplasty, left  Left hip pain  Difficulty walking up stairs  Abnormality of gait  Activity intolerance  Abnormal posture  Spherocytosis (familial) (HCC)      Subjective Assessment - 10/07/15 1402    Subjective I walked on an incline and I seemed to do fine.   Pertinent History L THA on 08-25-15  and ORIF of Left shoulder on 08-25-15/ No order for shoulder for 6 weeks.    How long can you sit comfortably? 2 hour   How long can you stand comfortably? 30 minutes   How long can you walk comfortably? 30 min   Diagnostic tests x ray, MRI   Patient Stated Goals I would like to walk without a limp and without an assistive device , be able to get up and down form chair   Currently in Pain? No/denies                         [facility] Adult PT Treatment/Exercise - 10/07/15 1518    Ambulation/Gait   Ambulation/Gait Yes   Ambulation/Gait Assistance 6: Modified independent (Device/Increase time)   Ambulation Distance (Feet) 200 Feet   Assistive device Straight cane   Gait Pattern Decreased stride length   Ambulation Surface Level   Stairs Yes   Stairs Assistance 6: Modified independent (Device/Increase time)   Stair Management Technique Step to pattern   Number of Stairs 16   Height of Stairs 6   Pre-Gait Activities wall kinetic chain exercise with right UE flexed maximally with Right LE glut activation x 10   Gait Comments VC reminders for correct technique for use of cane on level surface and stairs  prevention of falls.                PT Education - 10/07/15 1525    Education provided Yes   Education Details Pt/wife instructed in use of SPC on stairs and level surfaces, pre gait exericises at wall   Person(s) Educated Patient;Spouse   Methods Explanation;Demonstration   Comprehension Verbalized understanding;Returned demonstration          PT Short Term Goals - 10/03/15 1611    PT SHORT TERM GOAL #1   Title "Independent with initial HEP   Time 4   Period Weeks   Status Achieved   PT SHORT TERM GOAL #2   Title "Demonstrate and verbalize understanding of condition management including RICE, positioning, use of A.D., HEP.  Status Achieved   PT SHORT TERM GOAL #3   Title "Demonstrate understanding of proper sitting posture, body mechanics, work ergonomics, and be more conscious of position and posture throughout the day   Baseline requires cues for posture and tactile for sitting in LAQ   Time 4   Period Weeks   Status On-going   PT SHORT TERM GOAL #4   Title Pt will decreased pain in left hip from 5/10 to 2/10 with movement   Time 4   Period Weeks   Status Achieved   PT SHORT TERM GOAL #5   Title Pt will demonstrate 50% greater ease in rising from chair   Time 4   Period Weeks   Status Achieved           PT Long Term Goals - 10/03/15 1612    PT LONG TERM GOAL #1   Title "Pt will be independent with advanced HEP.    Time 8   Period Weeks   Status On-going   PT LONG TERM GOAL  #2   Title Pt will be able to negotiate stairs with LRAD and no increase in pain   Time 8   Period Weeks   Status Achieved   PT LONG TERM GOAL #3   Title "Pt will tolerate standing and walking for 90 minutes  without increased pain in order to return to PLOF    Time 8   Period Weeks   Status Unable to assess   PT LONG TERM GOAL #4   Title Pt will be at 1/10 pain or less with all functional activities and ADL's with left hip   Baseline most of the time   Time 8   Period Weeks   Status Partially Met   PT LONG TERM GOAL #5   Title LEFS will improve to at least 50 % limtation or40/80   Baseline 19/80 or 76.2 % limitation   Status Unable to assess   PT LONG TERM GOAL #6   Title Gait velocity will improve from 1.17 ft/sec to at least 2.62 ft/sec to show improved mobiility at community level   Time 8   Period Weeks   Status Unable to assess               Plan - 10/07/15 1521    Clinical Impression Statement Pt came to clinic 30 minutes late. due to wrong appt time in home calendar.  Pt seen for 15 minutes for gait and progression to straight cane on level surfaces and stairs. Pt is modified independent . He will see MD tomorrrow and may have new RX to begin working on shoulder   Pt will benefit from skilled therapeutic intervention in order to improve on the following deficits Abnormal gait;Decreased activity tolerance;Decreased balance;Decreased strength;Decreased mobility;Increased edema;Postural dysfunction;Improper body mechanics;Pain;Increased muscle spasms;Decreased range of motion;Difficulty walking;Decreased endurance   Rehab Potential Good   PT Frequency 2x / week   PT Duration 8 weeks   PT Treatment/Interventions ADLs/Self Care Home Management;Cryotherapy;Electrical Stimulation;Iontophoresis 59m/ml Dexamethasone;Moist Heat;Therapeutic exercise;Functional mobility training;Stair training;Gait training;Neuromuscular re-education;Patient/family education;Manual  techniques;Passive range of motion   PT Next Visit Plan gait with SPC, gait velocity, continue closed chain strength and balance, streamline HEP as able.    PT Home Exercise Plan use of straight point cane and increasing stride length        Problem List Patient Active Problem List   Diagnosis Date Noted  . Sphe[place]tosis (familial) ([place] 09/17/2015    LVoncille Lo PT 10/07/2015  3:25 PM Phone: [phone] Fax: [place] [facility] [address] [place], [place], [address] Phone: [phone]   Fax:  [phone]  Name: Irl Bodie MRN: [id number] Date of Birth: 30-Apr-1931

## 2015-10-07 NOTE — Telephone Encounter (Signed)
Pt was called on home phone and left message to let him know of missed appt. PT left message to check on pt and to inquire about his well being.  Pt was also told of future appt on Thursday October 10, 2015 at 3:00  Voncille Lo, PT 10/07/2015 1:57 PM Phone: [phone] Fax: [phone]

## 2015-10-10 ENCOUNTER — Ambulatory Visit: Payer: Medicare Other | Admitting: Physical Therapy

## 2015-10-10 DIAGNOSIS — D580 Hereditary spherocytosis: Secondary | ICD-10-CM | POA: Diagnosis not present

## 2015-10-10 DIAGNOSIS — R269 Unspecified abnormalities of gait and mobility: Secondary | ICD-10-CM

## 2015-10-10 DIAGNOSIS — M25552 Pain in left hip: Secondary | ICD-10-CM

## 2015-10-10 DIAGNOSIS — R6889 Other general symptoms and signs: Secondary | ICD-10-CM

## 2015-10-10 DIAGNOSIS — R262 Difficulty in walking, not elsewhere classified: Secondary | ICD-10-CM

## 2015-10-10 DIAGNOSIS — Z96642 Presence of left artificial hip joint: Secondary | ICD-10-CM

## 2015-10-10 DIAGNOSIS — R293 Abnormal posture: Secondary | ICD-10-CM

## 2015-10-10 NOTE — Therapy (Signed)
[place], [place], [address] Phone: [phone]   Fax:  [phone]  Physical Therapy Treatment  Patient Details  Name: Eric Larsen MRN: [id number] Date of Birth: [birth year] Referring Provider: Marchia Bond MD  Encounter Date: 10/10/2015      PT End of Session - 10/10/15 1552    Visit Number 8   Number of Visits 16   Date for PT Re-Evaluation 11/12/15   Authorization Type BCBS Medicare   PT Start Time 0349   PT Stop Time 0430   PT Time Calculation (min) 41 min      No past medical history on file.  No past surgical history on file.  There were no vitals filed for this visit.  Visit Diagnosis:  H/O total hip arthroplasty, left  Left hip pain  Difficulty walking up stairs  Abnormality of gait  Activity intolerance  Abnormal posture      Subjective Assessment - 10/10/15 1538    Subjective i walked at [place] for 300 ft over uneven sidewalk with no increased pain.    Currently in Pain? No/denies                         [name] Adult PT Treatment/Exercise - 10/10/15 0001    Ambulation/Gait   Ambulation/Gait Yes   Ambulation/Gait Assistance 6: Modified independent (Device/Increase time)   Ambulation Distance (Feet) 200 Feet   Assistive device Straight cane   Gait Pattern Decreased stride length   Ambulation Surface Level   Stairs Yes   Stairs Assistance 6: Modified independent (Device/Increase time)   Stair Management Technique Step to pattern  descending with cane   Number of Stairs 12   Height of Stairs 6   Gait Comments Pt able to ascend alternating pattern, his handrail at home is on left coming down and he is NWB on LUE so he must use step to pattern and cane to descend safely   Knee/Hip Exercises: Standing   Heel Raises 20 reps   Heel Raises Limitations then 10 each single leg   Lateral Step Up 1 set;10 reps;Hand Hold: 1;Step Height: 6"   Forward  Step Up 1 set;10 reps;Left;Hand Hold: 1   Step Down 1 set;10 reps;Step Height: 4"   SLS 4 sec best   Other Standing Knee Exercises aternating step taps on 6 inch step to promote increased left weight bearing- multiple LOB, able to correct independently  also unilateral step taps right   Other Standing Knee Exercises 3 way hip x 10 each and SLSL on left with 2 UE support   Knee/Hip Exercises: Sidelying   Clams AROM x10 then red band x 15                PT Education - 10/10/15 1551    Education provided Yes   Education Details clam shell red band   Person(s) Educated Patient   Methods Explanation;Handout   Comprehension Verbalized understanding          PT Short Term Goals - 10/03/15 1611    PT SHORT TERM GOAL #1   Title "Independent with initial HEP   Time 4   Period Weeks   Status Achieved   PT SHORT TERM GOAL #2   Title "Demonstrate and verbalize understanding of condition management including RICE, positioning, use of A.D., HEP.    Status Achieved   PT SHORT TERM GOAL #3   Title "Demonstrate understanding of proper  sitting posture, body mechanics, work ergonomics, and be more conscious of position and posture throughout the day   Baseline requires cues for posture and tactile for sitting in LAQ   Time 4   Period Weeks   Status On-going   PT SHORT TERM GOAL #4   Title Pt will decreased pain in left hip from 5/10 to 2/10 with movement   Time 4   Period Weeks   Status Achieved   PT SHORT TERM GOAL #5   Title Pt will demonstrate 50% greater ease in rising from chair   Time 4   Period Weeks   Status Achieved           PT Long Term Goals - 10/03/15 1612    PT LONG TERM GOAL #1   Title "Pt will be independent with advanced HEP.    Time 8   Period Weeks   Status On-going   PT LONG TERM GOAL #2   Title Pt will be able to negotiate stairs with LRAD and no increase in pain   Time 8   Period Weeks   Status Achieved   PT LONG TERM GOAL #3   Title "Pt will  tolerate standing and walking for 90 minutes  without increased pain in order to return to PLOF    Time 8   Period Weeks   Status Unable to assess   PT LONG TERM GOAL #4   Title Pt will be at 1/10 pain or less with all functional activities and ADL's with left hip   Baseline most of the time   Time 8   Period Weeks   Status Partially Met   PT LONG TERM GOAL #5   Title LEFS will improve to at least 50 % limtation or40/80   Baseline 19/80 or 76.2 % limitation   Status Unable to assess   PT LONG TERM GOAL #6   Title Gait velocity will improve from 1.17 ft/sec to at least 2.62 ft/sec to show improved mobiility at community level   Time 8   Period Weeks   Status Unable to assess               Plan - 10/10/15 1735    Clinical Impression Statement Pt saw MD yesterday and has an order for gentle PT. Pt forgot the order and it should be faxed today. Pt sees PT next visit to assess shoulder. Pt arrived with SPC. Instructed in reciprocal stair climbing and step to pattern to descend using SPC due to inability to bear weight on RUE  for now. Balance exercises including SLS for 4 sec best. Cues to shift weight and decrease UE assist. Pt to try at home. Began sidelying clams with red band and added to HEP. No pain.    PT Next Visit Plan gait with SPC, gait velocity, continue closed chain strength and balance, streamline HEP as able. ASSESS SHOULDER/ FOTO/ PROGRESS NOTE NEXT VISIT>         Problem List Patient Active Problem List   Diagnosis Date Noted  . Spherocytosis (familial) ([place]) 09/17/2015    Dorene Ar, PTA 10/10/2015, 5:39 PM  [facility] [address] [place], [place], [address] Phone: [phone]   Fax:  [phone]  Name: Rueben Kassim MRN: [id number] Date of Birth: August 12, 1931

## 2015-10-10 NOTE — Patient Instructions (Signed)
Abduction: Clam (Eccentric) - Side-Lying    Lie on side with knees bent. Lift top knee, keeping feet together. Keep trunk steady. Slowly lower for 3-5 seconds. _10__ reps per set, _2__ sets per day, __7_ days per week. Add __red band  http://ecce.exer.us/64   Copyright  VHI. All rights reserved.

## 2015-10-14 ENCOUNTER — Ambulatory Visit: Payer: Medicare Other | Admitting: Physical Therapy

## 2015-10-14 DIAGNOSIS — D580 Hereditary spherocytosis: Secondary | ICD-10-CM

## 2015-10-14 DIAGNOSIS — M25512 Pain in left shoulder: Secondary | ICD-10-CM

## 2015-10-14 DIAGNOSIS — Z96642 Presence of left artificial hip joint: Secondary | ICD-10-CM

## 2015-10-14 DIAGNOSIS — M25612 Stiffness of left shoulder, not elsewhere classified: Secondary | ICD-10-CM

## 2015-10-14 DIAGNOSIS — R262 Difficulty in walking, not elsewhere classified: Secondary | ICD-10-CM

## 2015-10-14 DIAGNOSIS — R6889 Other general symptoms and signs: Secondary | ICD-10-CM

## 2015-10-14 DIAGNOSIS — Z7409 Other reduced mobility: Secondary | ICD-10-CM

## 2015-10-14 DIAGNOSIS — M25552 Pain in left hip: Secondary | ICD-10-CM

## 2015-10-14 DIAGNOSIS — R293 Abnormal posture: Secondary | ICD-10-CM

## 2015-10-14 DIAGNOSIS — R269 Unspecified abnormalities of gait and mobility: Secondary | ICD-10-CM

## 2015-10-14 NOTE — Patient Instructions (Signed)
ROM: Pendulum (Circular)  Let right arm move in circle clockwise, then counterclockwise, by rocking body weight in circular pattern. Circle _10___ times each direction per set. Do _3___ sessions per day.  Pendulum Side to Side  Bend forward 90 at waist, leaning on table for support. Rock body from side to side and let arm swing freely. Repeat _10___ times. Do __3__ sessions per day.  Finger Flexors  Keeping right fingertips straight, press putty toward base of palm. Repeat __20__ times. Do __3__ sessions per day. Activity: Squeeze flour sifter, plastic squeeze bottles, [place] baster, juice from fruit.*  AROM: Elbow Flexion / Extension  With left hand palm up, gently bend elbow as far as possible. Then straighten arm as far as possible. Repeat __10__ times per set. Do __3__ sessions per day.  SHOULDER: Flexion On Table  Place hands on table, elbows straight. Move hips away from body. Press hands down into table. Hold _3__ seconds. _10__ reps per set, __3_ sets per day. Posture - Sitting   Sit upright, head facing forward. Try using a roll to support lower back. Keep shoulders relaxed, and avoid rounded back. Keep hips level with knees. Avoid crossing legs for long periods. Sit on sit bones not tailbone.  Copyright  VHI. All rights reserved.   Cryotherapy  ICE 20 mins at most, 3 times a day following exercises.   Cryotherapy means treatment with cold. Ice or gel packs can be used to reduce both pain and swelling. Ice is the most helpful within the first 24 to 48 hours after an injury or flare-up from overusing a muscle or joint. Sprains, strains, spasms, burning pain, shooting pain, and aches can all be eased with ice. Ice can also be used when recovering from surgery. Ice is effective, has very few side effects, and is safe for most people to use. PRECAUTIONS  Ice is not a safe treatment option for people with:  Raynaud phenomenon. This is a condition affecting small blood  vessels in the extremities. Exposure to cold may cause your problems to return.  Cold hypersensitivity. There are many forms of cold hypersensitivity, including:  Cold urticaria. Red, itchy hives appear on the skin when the tissues begin to warm after being iced.  Cold erythema. This is a red, itchy rash caused by exposure to cold.  Cold hemoglobinuria. Red blood cells break down when the tissues begin to warm after being iced. The hemoglobin that carry oxygen are passed into the urine because they cannot combine with blood proteins fast enough.  Numbness or altered sensitivity in the area being iced. If you have any of the following conditions, do not use ice until you have discussed cryotherapy with your caregiver:  Heart conditions, such as arrhythmia, angina, or chronic heart disease.  High blood pressure.  Healing wounds or open skin in the area being iced.  Current infections.  Rheumatoid arthritis.  Poor circulation.  Diabetes. Ice slows the blood flow in the region it is applied. This is beneficial when trying to stop inflamed tissues from spreading irritating chemicals to surrounding tissues. However, if you expose your skin to cold temperatures for too long or without the proper protection, you can damage your skin or nerves. Watch for signs of skin damage due to cold. HOME CARE INSTRUCTIONS Follow these tips to use ice and cold packs safely.  Place a dry or damp towel between the ice and skin. A damp towel will cool the skin more quickly, so you may need to shorten  the time that the ice is used.  For a more rapid response, add gentle compression to the ice.  Ice for no more than 20 minutes at a time. The bonier the area you are icing, the less time it will take to get the benefits of ice.  Check your skin after 5 minutes to make sure there are no signs of a poor response to cold or skin damage.  Rest 20 minutes or more between uses.  Once your skin is numb, you can  end your treatment. You can test numbness by very lightly touching your skin. The touch should be so light that you do not see the skin dimple from the pressure of your fingertip. When using ice, most people will feel these normal sensations in this order: cold, burning, aching, and numbness.  Do not use ice on someone who cannot communicate their responses to pain, such as small children or people with dementia. HOW TO MAKE AN ICE PACK Ice packs are the most common way to use ice therapy. Other methods include ice massage, ice baths, and cryosprays. Muscle creams that cause a cold, tingly feeling do not offer the same benefits that ice offers and should not be used as a substitute unless recommended by your caregiver. To make an ice pack, do one of the following:  Place crushed ice or a bag of frozen vegetables in a sealable plastic bag. Squeeze out the excess air. Place this bag inside another plastic bag. Slide the bag into a pillowcase or place a damp towel between your skin and the bag.  Mix 3 parts water with 1 part rubbing alcohol. Freeze the mixture in a sealable plastic bag. When you remove the mixture from the freezer, it will be slushy. Squeeze out the excess air. Place this bag inside another plastic bag. Slide the bag into a pillowcase or place a damp towel between your skin and the bag. SEEK MEDICAL CARE IF:  You develop white spots on your skin. This may give the skin a blotchy (mottled) appearance.  Your skin turns blue or pale.  Your skin becomes waxy or hard.  Your swelling gets worse. MAKE SURE YOU:   Understand these instructions.  Will watch your condition.  Will get help right away if you are not doing well or get worse. Document Released: 03/30/2011 Document Revised: 12/18/2013 Document Reviewed: 03/30/2011 [facility] Patient Information 2015 [place], [place]. This information is not intended to replace advice given to you by your health care provider. Make sure you  discuss any questions you have with your health care provider.   Voncille Lo, PT 10/14/2015 1:19 PM Phone: [phone] Fax: [phone]

## 2015-10-14 NOTE — Therapy (Signed)
[place], [place], [address] Phone: [phone]   Fax:  [phone]  Physical Therapy Treatment/ Reevaluation [facility] Note  Patient Details  Name: Eric Larsen MRN: [id number] Date of Birth: 12/26/1930 Referring Provider: Havery Moros MD  Encounter Date: 10/14/2015      PT End of Session - 10/14/15 1759    Visit Number 9   Number of Visits 25   Date for PT Re-Evaluation 11/12/15  hip   Authorization Type [employer] Medicare   Authorization Time Period 12-09-15 shoulder   Authorization - Visit Number 9   PT Start Time 1230   PT Stop Time 1340   PT Time Calculation (min) 70 min   Activity Tolerance Patient tolerated treatment well;Patient limited by pain   Behavior During Therapy [facility] for tasks assessed/performed      No past medical history on file.  No past surgical history on file.  There were no vitals filed for this visit.  Visit Diagnosis:  H/O total hip arthroplasty, left  Left hip pain  Difficulty walking up stairs  Abnormality of gait  Activity intolerance  Abnormal posture  Spherocytosis (familial) (HCC)  Left shoulder pain  Decreased ROM of left shoulder  Decreased functional mobility and endurance  Decreased activity tolerance      Subjective Assessment - 10/14/15 1241    Subjective I have been walking about 1/2 block for grandson's birthday party.  I have not been sleeping with the sling  I still  need help with taking off shirt . Dr. Mardelle Matte said I was ready to begin gentle PT.   Pertinent History L THA on 08-25-15  and ORIF of Left shoulder on 08-25-15/ Rehab for Left shoulder to begin 10-14-15  AROM-  no resistance   Limitations Lifting;Sitting;Standing;Walking;House hold activities   How long can you sit comfortably? 2 hour   How long can you stand comfortably? 30 minutes   How long can you walk comfortably? 30 min   Diagnostic tests x ray, MRI   Patient Stated Goals I  would like to walk without a limp and without an assistive device , be able to get up and down form chair and be able to lift groceries and use my left arm again   Currently in Pain? No/denies  Only a 2-3/10 when Pt overdoes   Pain Onset More than a month ago   Aggravating Factors  ADL's sleeping, moving hip    Pain Score 6  with movement   Pain Location Shoulder   Pain Orientation Left   Pain Descriptors / Indicators Sharp;Aching   Pain Onset More than a month ago   Pain Frequency Intermittent  when overdoing            [facility] PT Assessment - 10/14/15 1246    Assessment   Medical Diagnosis left ORIF of humeral head 08-24-15   Referring Provider Havery Moros MD   Onset Date/Surgical Date 08/24/15   Hand Dominance Right   Prior Therapy [facility] in [facility] and currently received PT for hip   Precautions   Precautions Shoulder;Posterior Hip   Shoulder Interventions Shoulder sling/immobilizer  now no longer wearing sling   Precaution Comments No lifting no resistance    Restrictions   Weight Bearing Restrictions Yes  left shoulder   LLE Weight Bearing Non weight bearing   Balance Screen   Has the patient fallen in the past 6 months Yes   How many  times? 1   Has the patient had a decrease in activity level because of a fear of falling?  Yes   Is the patient reluctant to leave their home because of a fear of falling?  No   Home [employer] residence   Living Arrangements Spouse/significant other   [place] to enter   Entrance Stairs-Number of Steps 3   Entrance Stairs-Rails Right   [place] Two level  bed and bath downsteps   Prior Function   Level of Independence Independent   Cognition   Overall Cognitive Status Within Functional Limits for tasks assessed   Observation/Other Assessments   Focus on Therapeutic Outcomes (FOTO)  Intake shoulder 23%, limtation 77% and predicted 39%   Single  Leg Stance   Comments left SLS 4 sec  right 20 seconds   Posture/Postural Control   Posture/Postural Control Postural limitations   Postural Limitations Rounded Shoulders;Forward head;Increased thoracic kyphosis   AROM   Right Shoulder Extension 40 Degrees   Right Shoulder Flexion 128 Degrees   Right Shoulder ABduction 120 Degrees   Right Shoulder Internal Rotation 58 Degrees   Right Shoulder External Rotation 58 Degrees   Right Shoulder Horizontal ABduction 88 Degrees   Right Shoulder Horizontal  ADduction 130 Degrees   Left Shoulder Extension 40 Degrees   Left Shoulder Flexion 40 Degrees  pain   Left Shoulder ABduction 38 Degrees   Right Hip Flexion 110   Right Hip External Rotation  35   Right Hip Internal Rotation  25   Left Hip Flexion 90   Left Hip External Rotation  25   Left Hip Internal Rotation  10   Right Knee Extension 5   Right Knee Flexion 140   Left Knee Extension 10   Left Knee Flexion 121   PROM   Left Shoulder Flexion 74 Degrees  end range pain   Left Shoulder ABduction 66 Degrees  ERP   Left Shoulder Internal Rotation 18 Degrees  abd 45 degrees ERP   Left Shoulder External Rotation 0 Degrees  shoulder abd 45 degrees ERP   Strength   Overall Strength Comments Right UE grossly 4/5   Left Shoulder Flexion 3-/5   Left Shoulder Extension 3-/5   Left Shoulder ABduction 3-/5   Left Shoulder Internal Rotation 2/5   Left Shoulder External Rotation 2/5   Right Hand Grip (lbs) 63, 64, 65 lb   Left Hand Grip (lbs) 50, 48, 42   Palpation   Palpation comment tenderness over periscapular musculature and tenderness over proximal humerus.     Ambulation/Gait   Ambulation/Gait Yes   Ambulation/Gait Assistance 6: Modified independent (Device/Increase time)   Ambulation Distance (Feet) 200 Feet   Assistive device Straight cane   Gait Pattern Decreased stride length   Gait velocity 2.22 ft/sec   Stairs Yes   Stairs Assistance 6: Modified independent  (Device/Increase time)   Stair Management Technique Step to pattern  descending with cane   Number of Stairs 12   Height of Stairs 6   Gait Comments Pt able to ascend alternating pattern, his handrail at home is on left coming down and he is NWB on LUE so he must use step to pattern and cane to descend safely                     [facility] Adult PT Treatment/Exercise - 10/14/15 1246    Self-Care   Self-Care Posture;RICE   RICE use  of ice for shoulder post exericise   Posture sitting posture to reduce kyphosis   Shoulder Exercises: ROM/Strengthening   Pendulum 10  circles , ant/posterior   Other ROM/Strengthening Exercises finger flexors squeezing wash cloth or putty, 20 times   Other ROM/Strengthening Exercises Elbow flex/extension with table and shoulder flexion on table 10 reps each with VC and TC for technique   Modalities   Modalities Cryotherapy   Cryotherapy   Number Minutes Cryotherapy 10 Minutes   Cryotherapy Location Shoulder  left   Type of Cryotherapy Ice pack   Manual Therapy   Manual Therapy Soft tissue mobilization   Soft tissue mobilization left sub scapularis and periscapular muscles.   Passive ROM PROM of left shoulder in all planes to pt tolerance                PT Education - 10/14/15 1233    Education provided Yes   Education Details POC for left shoulder, Explanation of finding and initial HEP for left shoulder and use of cryotherapy Reevaluation of Left hip   Person(s) Educated Patient;Spouse   Methods Explanation;Handout;Verbal cues;Demonstration   Comprehension Verbalized understanding;Returned demonstration          PT Short Term Goals - 10/14/15 1817    PT SHORT TERM GOAL #1   Title "Independent with initial HEP for Hip 10-14-15   Time 4   Period Weeks   Status Achieved   PT SHORT TERM GOAL #2   Title "Demonstrate and verbalize understanding of condition management including RICE, positioning, use of A.D., HEP. 10-14-15   Time  4   Period Weeks   Status Achieved   PT SHORT TERM GOAL #3   Title "Demonstrate understanding of proper sitting posture, body mechanics, work ergonomics, and be more conscious of position and posture throughout the day 10-14-15   Baseline requires cues for posture and tactile for sitting in LAQ   Time 4   Period Weeks   Status On-going   PT SHORT TERM GOAL #4   Title Pt will decreased pain in left hip from 5/10 to 2/10 with movement 10-14-15   Baseline No pain / denies pain   Time 4   Period Weeks   Status Achieved   PT SHORT TERM GOAL #5   Title Pt will demonstrate 50% greater ease in rising from chair with use of bil UE support 11-11-15   Time 4   Period Weeks   Status Revised   PT SHORT TERM GOAL #6   Title Pt will be able to perform initial HEP for left shoulder 11-11-15   Baseline given 10-14-15   Time 4   Period Weeks   Status New   PT SHORT TERM GOAL #7   Title Pt will be able to sleep on left side with pain 3/10 or less 11-10-12   Time 4   Period Weeks   Status New   PT SHORT TERM GOAL #8   Title Pt will be able to lift arm AROM to 90 degrees in order to perform ADL's with greater ease 11-11-15   Time 4   Period Weeks   Status New           PT Long Term Goals - 10/14/15 1821    PT LONG TERM GOAL #1   Title "Pt will be independent with advanced HEP. for left hip and left UE 12-09-15   Time 8   Period Weeks   Status Revised   PT LONG TERM GOAL #  2   Title Pt will be able to negotiate stairs with LRAD and no increase in pain in UE or hip left  12-09-15   Time 8   Period Weeks   Status Revised   PT LONG TERM GOAL #3   Title "Pt will tolerate standing and walking for 90 minutes  without increased pain in order to return to PLOF  12-09-15   PT LONG TERM GOAL #4   Title Pt will be at 1/10 pain or less with all functional activities and ADL's with left hip and Left UE 12-09-15   Time 8   Period Weeks   Status Revised   PT LONG TERM GOAL #5   Title LEFS will improve to  at least 50 % limtation or40/80 12-09-15   Baseline 19/80 or 76.2 % limitation   Period Weeks   Status On-going   PT LONG TERM GOAL #6   Title Gait velocity will improve from 1.17 ft/sec to at least 2.62 ft/sec to show improved mobiility at community level 12-09-15   Baseline 2.22 ft/sec with straight point cane   Time 8   Period Weeks   Status On-going   PT LONG TERM GOAL #7   Title left  shoulder AROM scaption will improve to 0-115 degrees for improved overhead reaching. 12-09-15   Time 8   Period Weeks   Status New   PT LONG TERM GOAL #8   Title "Tolerate light resistance exercises in flexion and scaption with minimal pain with left UE 12-09-15   Time 8   Period Weeks   Status New   PT LONG TERM GOAL  #9   TITLE Pt will be able to carry light groceries with bil UE without exacerbating pain 12-09-15   Time 8   Period Weeks   Status New   PT LONG TERM GOAL  #10   TITLE Pt will be able to perform ADL's independently including putting on socks 12-09-15   Time 8   Period Weeks   Status New               Plan - 10/14/15 1258    Clinical Impression Statement Mr. Daubert presents with L THA and L UE ORIF of humeral head (surgery in [facility] on 08-25-15. Pt fell on 08-24-15.  Dr. Marchia Bond now managing care. PT received written order by fax from MD for gentle ROM for left UE without resistance.   [place]  Pt will recieve PROM to left shoulder. He is 7 weeks post fall/surgery.   Pt presents with improving strength and ROM of L Hip.  Pt is now ambulating with can and has 2.22 ft/sec gait veleocity.  Pt still with strength limitation in left LE.  Pt also presents with  very limited AROM of left  UE. SEE asessment.   Pt will benefit from skilled PT to address deficits in ROM, MMT, functional mobiilty and ability to carry objects.  Pt restricted in Left UE and is unable to lift Activi[place] to 90 degrees.  Pt currently cannot bear weight throught  Left shoulder.  Pt will benefit from 2 x a week for 8 weeks to maximize mobility of LLE and funtional use  of LUE.    Pt will benefit from skilled therapeutic intervention in order to improve on the following deficits Abnormal gait;Decreased activity tolerance;Decreased balance;Decreased strength;Decreased mobility;Increased edema;Postural dysfunction;Improper body mechanics;Pain;Increased muscle spasms;Decreased range of motion;Difficulty walking;Decreased endurance;Impaired UE functional use;Impaired  flexibility;Increased fascial restricitons;Decreased scar mobility   Rehab Potential Good   PT Frequency 2x / week   PT Duration 8 weeks   PT Treatment/Interventions ADLs/Self Care Home Management;Cryotherapy;Electrical Stimulation;Iontophoresis 4mg /ml Dexamethasone;Moist Heat;Therapeutic exercise;Functional mobility training;Stair training;Gait training;Neuromuscular re-education;Patient/family education;Manual techniques;Passive range of motion;Dry needling;Taping;Ultrasound;Scar mobilization   PT Next Visit Plan Progress Left UE with supine cane exericises.  left scapular soft tissue and PROM of left UE,    PT Home Exercise Plan Pendulum, posture, cryotherapy. and elbow ext, and gentle ROM for shoulderwith use of table          G-Codes - 11/01/15 1849    Functional Assessment Tool Used Clinical judgment for hip and FOTO for shoulder   Functional Limitation Mobility: Walking and moving around;Carrying, moving and handling objects   Mobility: Walking and Moving Around Current Status [id number]) At least 40 percent but less than 60 percent impaired, limited or restricted   Mobility: Walking and Moving Around Goal Status [phone]) At least 20 percent but less than 40 percent impaired, limited or restricted   Carrying, Moving and Handling Objects Current Status [id number]) At least 60 percent but less than 80 percent impaired, limited or restricted  77%   Carrying, Moving and Handling Objects Goal Status  [id number]) At least 20 percent but less than 40 percent impaired, limited or restricted   Carrying, Moving and Handling Objects Discharge Status [phone]) At least 60 percent but less than 80 percent impaired, limited or restricted      Problem List Patient Active Problem List   Diagnosis Date Noted  . Spherocytosis (familial) ([place]) 09/17/2015    Voncille Lo, PT November 01, 2015 7:06 PM Phone: [phone] Fax: [phone]  Pt currently being seen for left THR PT and beginning L UE PT for Left ORIF proximal humeral fx.  By signing I understand that I am ordering/authorizing the use of Iontophoresis using 4 mg/mL of dexamethasone as a component of this plan of care. [place], [place], [address] Phone: [phone]   Fax:  [phone]  Name: Eric Larsen MRN: [id number] Date of Birth: 28-Oct-1930

## 2015-10-16 ENCOUNTER — Ambulatory Visit: Payer: Medicare Other | Attending: Orthopedic Surgery | Admitting: Physical Therapy

## 2015-10-16 DIAGNOSIS — Z96642 Presence of left artificial hip joint: Secondary | ICD-10-CM | POA: Diagnosis present

## 2015-10-16 DIAGNOSIS — M7582 Other shoulder lesions, left shoulder: Secondary | ICD-10-CM | POA: Diagnosis present

## 2015-10-16 DIAGNOSIS — M25612 Stiffness of left shoulder, not elsewhere classified: Secondary | ICD-10-CM

## 2015-10-16 DIAGNOSIS — R269 Unspecified abnormalities of gait and mobility: Secondary | ICD-10-CM | POA: Diagnosis present

## 2015-10-16 DIAGNOSIS — R293 Abnormal posture: Secondary | ICD-10-CM | POA: Insufficient documentation

## 2015-10-16 DIAGNOSIS — D580 Hereditary spherocytosis: Secondary | ICD-10-CM | POA: Diagnosis present

## 2015-10-16 DIAGNOSIS — R262 Difficulty in walking, not elsewhere classified: Secondary | ICD-10-CM | POA: Diagnosis present

## 2015-10-16 DIAGNOSIS — R6889 Other general symptoms and signs: Secondary | ICD-10-CM | POA: Insufficient documentation

## 2015-10-16 DIAGNOSIS — Z7409 Other reduced mobility: Secondary | ICD-10-CM | POA: Diagnosis present

## 2015-10-16 DIAGNOSIS — M25552 Pain in left hip: Secondary | ICD-10-CM | POA: Insufficient documentation

## 2015-10-16 DIAGNOSIS — M25512 Pain in left shoulder: Secondary | ICD-10-CM | POA: Insufficient documentation

## 2015-10-16 NOTE — Patient Instructions (Addendum)
SHOULDER: External Rotation - Supine (Cane)   Hold cane with both hands. Rotate arm away from body. Keep elbow on floor and next to body. _10__ reps per set, 10 seconds,  __2_ sets per day, _7__ days per week    Cane Exercise: Flexion   Lie on back, holding cane above chest. Keeping arms as straight as possible, lower cane toward floor beyond head. Hold _5___ seconds. Repeat __10__ times. Do _2___ sessions per day.

## 2015-10-16 NOTE — Therapy (Signed)
[place], [place], [address] Phone: [phone]   Fax:  [phone]  Physical Therapy Treatment  Patient Details  Name: Eric Larsen MRN: [id number] Date of Birth: 01-Oct-1930 Referring Provider: Havery Moros MD  Encounter Date: 10/16/2015      PT End of Session - 10/16/15 1335    Visit Number 10   Number of Visits 25   Date for PT Re-Evaluation 11/12/15   PT Start Time 0132   PT Stop Time 0218   PT Time Calculation (min) 46 min      No past medical history on file.  No past surgical history on file.  There were no vitals filed for this visit.  Visit Diagnosis:  Left shoulder pain  Decreased ROM of left shoulder      Subjective Assessment - 10/16/15 1645    Currently in Pain? No/denies                         [facility] Adult PT Treatment/Exercise - 10/16/15 0001    Shoulder Exercises: Supine   Other Supine Exercises supine cane chest press and ER x 10 each- emphasis on scapula position   Shoulder Exercises: Seated   Other Seated Exercises scap squeezes, shrugs focusing on depression, shoulder rolls- all with emphasis on posture.    Other Seated Exercises table slides sliding tray table and sliding chair x 5 each    Shoulder Exercises: ROM/Strengthening   Other ROM/Strengthening Exercises Standing elbow flexion and extension x20    Manual Therapy   Manual Therapy Soft tissue mobilization   Soft tissue mobilization left sub scapularis and periscapular muscles.                  PT Short Term Goals - 10/14/15 1817    PT SHORT TERM GOAL #1   Title "Independent with initial HEP for Hip 10-14-15   Time 4   Period Weeks   Status Achieved   PT SHORT TERM GOAL #2   Title "Demonstrate and verbalize understanding of condition management including RICE, positioning, use of A.D., HEP. 10-14-15   Time 4   Period Weeks   Status Achieved   PT SHORT TERM GOAL #3   Title  "Demonstrate understanding of proper sitting posture, body mechanics, work ergonomics, and be more conscious of position and posture throughout the day 10-14-15   Baseline requires cues for posture and tactile for sitting in LAQ   Time 4   Period Weeks   Status On-going   PT SHORT TERM GOAL #4   Title Pt will decreased pain in left hip from 5/10 to 2/10 with movement 10-14-15   Baseline No pain / denies pain   Time 4   Period Weeks   Status Achieved   PT SHORT TERM GOAL #5   Title Pt will demonstrate 50% greater ease in rising from chair with use of bil UE support 11-11-15   Time 4   Period Weeks   Status Revised   PT SHORT TERM GOAL #6   Title Pt will be able to perform initial HEP for left shoulder 11-11-15   Baseline given 10-14-15   Time 4   Period Weeks   Status New   PT SHORT TERM GOAL #7   Title Pt will be able to sleep on left side with pain 3/10 or less 11-10-12   Time 4   Period Weeks   Status New   PT  SHORT TERM GOAL #8   Title Pt will be able to lift arm AROM to 90 degrees in order to perform ADL's with greater ease 11-11-15   Time 4   Period Weeks   Status New           PT Long Term Goals - 10/14/15 1821    PT LONG TERM GOAL #1   Title "Pt will be independent with advanced HEP. for left hip and left UE 12-09-15   Time 8   Period Weeks   Status Revised   PT LONG TERM GOAL #2   Title Pt will be able to negotiate stairs with LRAD and no increase in pain in UE or hip left  12-09-15   Time 8   Period Weeks   Status Revised   PT LONG TERM GOAL #3   Title "Pt will tolerate standing and walking for 90 minutes  without increased pain in order to return to PLOF  12-09-15   PT LONG TERM GOAL #4   Title Pt will be at 1/10 pain or less with all functional activities and ADL's with left hip and Left UE 12-09-15   Time 8   Period Weeks   Status Revised   PT LONG TERM GOAL #5   Title LEFS will improve to at least 50 % limtation or40/80 12-09-15   Baseline 19/80 or 76.2 %  limitation   Period Weeks   Status On-going   PT LONG TERM GOAL #6   Title Gait velocity will improve from 1.17 ft/sec to at least 2.62 ft/sec to show improved mobiility at community level 12-09-15   Baseline 2.22 ft/sec with straight point cane   Time 8   Period Weeks   Status On-going   PT LONG TERM GOAL #7   Title left  shoulder AROM scaption will improve to 0-115 degrees for improved overhead reaching. 12-09-15   Time 8   Period Weeks   Status New   PT LONG TERM GOAL #8   Title "Tolerate light resistance exercises in flexion and scaption with minimal pain with left UE 12-09-15   Time 8   Period Weeks   Status New   PT LONG TERM GOAL  #9   TITLE Pt will be able to carry light groceries with bil UE without exacerbating pain 12-09-15   Time 8   Period Weeks   Status New   PT LONG TERM GOAL  #10   TITLE Pt will be able to perform ADL's independently including putting on socks 12-09-15   Time 8   Period Weeks   Status New               Plan - 10/16/15 1632    Clinical Impression Statement Pt reports pain with elbow flexion with elbow on table, encouraged him to perform with neutral arm. Instructed in supine chest press with cane with emphasis on scapular mobility, also ER with cane. Scapular mobs and PROM with soft tissue work to peri scap and sub scap to improve scapular mobility. Seated scapular exercises/postural emphasis given for HEP.     PT Next Visit Plan check HEP, continue supine cane as tolerated, continue left scap soft tissue/ scap mobs, PROM        Problem List Patient Active Problem List   Diagnosis Date Noted  . Spherocytosis (familial) ([place]) 09/17/2015    Dorene Ar, PTA 10/16/2015, 4:45 PM  [facility] [address] [place], [place], [address]  Phone: [phone]   Fax:  [phone]  Name: Gustavo Heeney MRN: [id number] Date of Birth: 12-Nov-1930

## 2015-10-22 ENCOUNTER — Ambulatory Visit: Payer: Medicare Other | Admitting: Physical Therapy

## 2015-10-22 DIAGNOSIS — M25612 Stiffness of left shoulder, not elsewhere classified: Secondary | ICD-10-CM

## 2015-10-22 DIAGNOSIS — M25552 Pain in left hip: Secondary | ICD-10-CM

## 2015-10-22 DIAGNOSIS — R269 Unspecified abnormalities of gait and mobility: Secondary | ICD-10-CM

## 2015-10-22 DIAGNOSIS — M25512 Pain in left shoulder: Secondary | ICD-10-CM

## 2015-10-22 DIAGNOSIS — D580 Hereditary spherocytosis: Secondary | ICD-10-CM

## 2015-10-22 DIAGNOSIS — R6889 Other general symptoms and signs: Secondary | ICD-10-CM

## 2015-10-22 DIAGNOSIS — Z96642 Presence of left artificial hip joint: Secondary | ICD-10-CM

## 2015-10-22 DIAGNOSIS — Z7409 Other reduced mobility: Secondary | ICD-10-CM

## 2015-10-22 DIAGNOSIS — R262 Difficulty in walking, not elsewhere classified: Secondary | ICD-10-CM

## 2015-10-22 DIAGNOSIS — R293 Abnormal posture: Secondary | ICD-10-CM

## 2015-10-22 NOTE — Therapy (Signed)
[place], [place], [address] Phone: [phone]   Fax:  [phone]  Physical Therapy Treatment  Patient Details  Name: Eric Larsen MRN: [id number] Date of Birth: [birth year] Referring Provider: Havery Moros MD  Encounter Date: 10/22/2015      PT End of Session - 10/22/15 1228    Visit Number 11   Number of Visits 25   Date for PT Re-Evaluation 11/12/15   Authorization Type BCBS Medicare   Authorization Time Period 12-09-15 shoulder   PT Start Time 1100   PT Stop Time 1204   PT Time Calculation (min) 64 min   Activity Tolerance Patient tolerated treatment well   Behavior During Therapy [facility] for tasks assessed/performed      No past medical history on file.  No past surgical history on file.  There were no vitals filed for this visit.  Visit Diagnosis:  Left shoulder pain  Activity intolerance  Decreased ROM of left shoulder  Abnormal posture  H/O total hip arthroplasty, left  Spherocytosis (familial) (HCC)  Left hip pain  Decreased functional mobility and endurance  Decreased activity tolerance  Difficulty walking up stairs  Abnormality of gait      Subjective Assessment - 10/22/15 1102    Subjective i have pain and difficulty with cane exericise with 7/10 pain   Currently in Pain? No/denies   Pain Score 7  with movement   Pain Location Shoulder   Pain Orientation Left   Pain Descriptors / Indicators Sharp;Aching   Pain Type Chronic pain   Pain Onset More than a month ago   Pain Frequency Intermittent            [facility] PT Assessment - 10/22/15 1230    AROM   Right Shoulder Extension 40 Degrees   Right Shoulder Flexion 128 Degrees   Right Shoulder ABduction 120 Degrees   Right Shoulder Internal Rotation 58 Degrees   Right Shoulder External Rotation 58 Degrees   Right Shoulder Horizontal ABduction 88 Degrees   Right Shoulder Horizontal  ADduction 130 Degrees    Left Shoulder Extension 40 Degrees   Left Shoulder Flexion 78 Degrees  ERP   Left Shoulder ABduction 55 Degrees  ERP   Left Shoulder Internal Rotation 25 Degrees   Left Shoulder External Rotation 10 Degrees  ERP   Right Elbow Extension 0   Left Elbow Extension 18                     [facility] Adult PT Treatment/Exercise - 10/22/15 1112    Self-Care   RICE use of heat and ice diretions and instructions for home ice pack   Elbow Exercises   Other elbow exercises supine elbow flexion with palms up and stretching for 60 seconds x 2 with towel roll under arm for fulcrum and comfort   Shoulder Exercises: Supine   Other Supine Exercises supine cane chest press and ER x 10 each- emphasis on scapula position  also ER stretch  3 60 seconds end range visible co contracti   Shoulder Exercises: Seated   Other Seated Exercises scap squeezes, shrugs focusing on depression, shoulder rolls- review   Other Seated Exercises --   Shoulder Exercises: ROM/Strengthening   Other ROM/Strengthening Exercises UE ranger in sitting with flexion, abd and diagonals   and also in side lying for flexion with UE ranger and abd while PT mobilizes scapula    Other ROM/Strengthening Exercises Standing elbow flexion and extension x20  Manual Therapy   Manual Therapy Soft tissue mobilization;Myofascial release   Soft tissue mobilization left sub scapularis and periscapular muscles.and anterior shoulder musculature especially proximal humerus over plate.   Myofascial Release left biceps                PT Education - 10/22/15 1209    Education provided Yes   Education Details Community wellness with massage therapist and gentle massage. posture review, cryotherapy and heat and elbow stretch and exericise added to HEP   Person(s) Educated Patient;Spouse   Methods Explanation;Demonstration;Tactile cues;Verbal cues;Handout   Comprehension Verbalized understanding;Returned demonstration          PT  Short Term Goals - 10/22/15 1106    PT SHORT TERM GOAL #1   Title "Independent with initial HEP for Hip 10-14-15   Time 4   Period Weeks   Status Achieved   PT SHORT TERM GOAL #2   Title "Demonstrate and verbalize understanding of condition management including RICE, positioning, use of A.D., HEP. 10-14-15   Time 4   Period Weeks   Status Achieved   PT SHORT TERM GOAL #3   Title "Demonstrate understanding of proper sitting posture, body mechanics, work ergonomics, and be more conscious of position and posture throughout the day 10-14-15   Baseline wife reminds him    Time 4   Period Weeks   Status Achieved   PT SHORT TERM GOAL #4   Title Pt will decreased pain in left hip from 5/10 to 2/10 with movement 10-14-15   Baseline no/pain/ denies pain   Time 4   Period Weeks   Status Achieved   PT SHORT TERM GOAL #5   Title Pt will demonstrate 50% greater ease in rising from chair with use of bil UE support 11-11-15   Baseline Pt perfroms 5 x without UE   Time 4   Period Weeks   Status Achieved   PT SHORT TERM GOAL #6   Title Pt will be able to perform initial HEP for left shoulder 11-11-15   Baseline given 10-14-15   Time 4   Period Weeks   Status On-going   PT SHORT TERM GOAL #7   Title Pt will be able to sleep on left side with pain 3/10 or less 11-10-12   Time 4   Period Weeks   Status On-going   PT SHORT TERM GOAL #8   Title Pt will be able to lift arm AROM to 90 degrees in order to perform ADL's with greater ease 11-11-15   Time 4   Period Weeks   Status On-going           PT Long Term Goals - 10/22/15 1238    PT LONG TERM GOAL #1   Title "Pt will be independent with advanced HEP. for left hip and left UE 12-09-15   Time 8   Period Weeks   Status On-going   PT LONG TERM GOAL #2   Title Pt will be able to negotiate stairs with LRAD and no increase in pain in UE or hip left  12-09-15   Time 8   Period Weeks   Status On-going   PT LONG TERM GOAL #3   Title "Pt will  tolerate standing and walking for 90 minutes  without increased pain in order to return to PLOF  12-09-15   Time 8   Period Weeks   Status On-going   PT LONG TERM GOAL #4   Title Pt will be  at 1/10 pain or less with all functional activities and ADL's with left hip and Left UE 12-09-15   Time 8   Period Weeks   Status On-going   PT LONG TERM GOAL #5   Title LEFS will improve to at least 50 % limtation or40/80 12-09-15   Period Weeks   Status On-going   PT LONG TERM GOAL #6   Title Gait velocity will improve from 1.17 ft/sec to at least 2.62 ft/sec to show improved mobiility at community level 12-09-15   Time 8   Period Weeks   Status On-going   PT LONG TERM GOAL #7   Title left  shoulder AROM scaption will improve to 0-115 degrees for improved overhead reaching. 12-09-15   Time 8   Period Weeks   Status On-going   PT LONG TERM GOAL #8   Title "Tolerate light resistance exercises in flexion and scaption with minimal pain with left UE 12-09-15   Time 8   Period Weeks   Status On-going   PT LONG TERM GOAL  #9   TITLE Pt will be able to carry light groceries with bil UE without exacerbating pain 12-09-15   Time 8   Period Weeks   Status On-going   PT LONG TERM GOAL  #10   TITLE Pt will be able to perform ADL's independently including putting on socks 12-09-15   Time 8   Period Weeks   Status On-going               Plan - 10/22/15 1211    Clinical Impression Statement Pt with initial pain 7/10.. Improved sharpness after  5-6/10 with movement after treatment especially with elbow extension and supination and pronation. Pt with 18 degree left elbow flexion restriction.muscular contraction. Pt reports sharp shooting pain initially. and difficulty performing exericises on table and with cane due to restrictions.  Pt responds to soft tissue work and reduces pain with treatment.  Pt with increased AROM from evaluation and was encouraged to perform elbow stretches before cane exericises.   and to use heat before exercise and ice  after if pain continues.     Pt will benefit from skilled therapeutic intervention in order to improve on the following deficits Abnormal gait;Decreased activity tolerance;Decreased balance;Decreased strength;Decreased mobility;Increased edema;Postural dysfunction;Improper body mechanics;Pain;Increased muscle spasms;Decreased range of motion;Difficulty walking;Decreased endurance;Impaired UE functional use;Impaired flexibility;Increased fascial restricitons;Decreased scar mobility   Rehab Potential Good   PT Frequency 2x / week   PT Duration 8 weeks   PT Treatment/Interventions ADLs/Self Care Home Management;Cryotherapy;Electrical Stimulation;Iontophoresis 4mg /ml Dexamethasone;Moist Heat;Therapeutic exercise;Functional mobility training;Stair training;Gait training;Neuromuscular re-education;Patient/family education;Manual techniques;Passive range of motion;Dry needling;Taping;Ultrasound;Scar mobilization   PT Next Visit Plan check supine cane, cont scap softtissue /scap mobs, PROM,  finger ladder.   PT Home Exercise Plan Pendulum, posture, cryotherapy. and elbow ext, and gentle ROM for shoulderwith use of table and modifications   Consulted and Agree with Plan of Care Patient        Problem List Patient Active Problem List   Diagnosis Date Noted  . Spherocytosis (familial) ([place]) 09/17/2015    Voncille Lo, PT 10/22/2015 12:42 PM Phone: [phone] Fax: [facility] [address] [place], [place], [address] Phone: [phone]   Fax:  [phone]  Name: Eric Larsen MRN: [id number] Date of Birth: [birth year]

## 2015-10-22 NOTE — Patient Instructions (Addendum)
   Copyright  VHI. All rights reserved.  ELBOW: Extension - Supine stretch exercise   Rest arm on chest. Straighten elbow. Palm up straighten and flexing  Use towel roll under right elbow as fulcrum as shown in clinic above elbow to allow space for comfort   3 x a day   Hold for 30 to 60 seconds to stretch biceps. _  Copyright  VHI. All rights reserved.  AROM: Elbow Flexion / Extension  Strengthen.   With left hand palm up, gently bend elbow as far as possible. Then straighten arm as far as possible. Repeat _10___ times per set. Do _1___ sets per session. Do ___3_ sessions per day.  You may also do standing up for flexion/extension exercise   Use heat before exercise and ice after for pain reduction.  Handout on how to make a homemade ice pack  Voncille Lo, PT 10/22/2015 11:57 AM Phone: [phone] Fax: [phone]

## 2015-10-24 ENCOUNTER — Ambulatory Visit: Payer: Medicare Other | Admitting: Physical Therapy

## 2015-10-24 DIAGNOSIS — M25512 Pain in left shoulder: Secondary | ICD-10-CM | POA: Diagnosis not present

## 2015-10-24 DIAGNOSIS — R6889 Other general symptoms and signs: Secondary | ICD-10-CM

## 2015-10-24 DIAGNOSIS — M25612 Stiffness of left shoulder, not elsewhere classified: Secondary | ICD-10-CM

## 2015-10-24 DIAGNOSIS — R293 Abnormal posture: Secondary | ICD-10-CM

## 2015-10-24 NOTE — Therapy (Signed)
[place], [place], [address] Phone: [phone]   Fax:  [phone]  Physical Therapy Treatment  Patient Details  Name: Eric Larsen MRN: [id number] Date of Birth: 1931/03/16 Referring Provider: Havery Moros MD  Encounter Date: 10/24/2015      PT End of Session - 10/24/15 1344    Visit Number 12   Number of Visits 25   Date for PT Re-Evaluation 11/12/15   Authorization Type BCBS Medicare   PT Start Time 0139   PT Stop Time 0235   PT Time Calculation (min) 56 min      No past medical history on file.  No past surgical history on file.  There were no vitals filed for this visit.  Visit Diagnosis:  Left shoulder pain  Activity intolerance  Decreased ROM of left shoulder  Abnormal posture      Subjective Assessment - 10/24/15 1520    Subjective The cane exercises are still painful   Currently in Pain? No/denies                         [facility] Adult PT Treatment/Exercise - 10/24/15 0001    Self-Care   Self-Care Other Self-Care Comments   Other Self-Care Comments  Pt brought shoulder/arm velcro cold gel pack- pt's wife instructed how to don properly.    Shoulder Exercises: Supine   Other Supine Exercises supine cane chest press and ER x 10 each- emphasis on scapula position   Other Supine Exercises clasped hand flexion, pullovers   Shoulder Exercises: Seated   Other Seated Exercises Seated ER with towel under elbow - pt able to get to neutral- more tolerable today- used open fist UE ranger   Shoulder Exercises: ROM/Strengthening   Wall [place] Wall slides x 10 using assist from RUE- unable to attain neutral ER   Other ROM/Strengthening Exercises UE ranger standing with flexion x 10    Other ROM/Strengthening Exercises Wall ladder for flexion to level 12 x 10   Cryotherapy   Number Minutes Cryotherapy 10 Minutes   Cryotherapy Location Shoulder  left   Type of Cryotherapy  Ice pack                  PT Short Term Goals - 10/22/15 1106    PT SHORT TERM GOAL #1   Title "Independent with initial HEP for Hip 10-14-15   Time 4   Period Weeks   Status Achieved   PT SHORT TERM GOAL #2   Title "Demonstrate and verbalize understanding of condition management including RICE, positioning, use of A.D., HEP. 10-14-15   Time 4   Period Weeks   Status Achieved   PT SHORT TERM GOAL #3   Title "Demonstrate understanding of proper sitting posture, body mechanics, work ergonomics, and be more conscious of position and posture throughout the day 10-14-15   Baseline wife reminds him    Time 4   Period Weeks   Status Achieved   PT SHORT TERM GOAL #4   Title Pt will decreased pain in left hip from 5/10 to 2/10 with movement 10-14-15   Baseline no/pain/ denies pain   Time 4   Period Weeks   Status Achieved   PT SHORT TERM GOAL #5   Title Pt will demonstrate 50% greater ease in rising from chair with use of bil UE support 11-11-15   Baseline Pt perfroms 5 x without UE   Time 4  Period Weeks   Status Achieved   PT SHORT TERM GOAL #6   Title Pt will be able to perform initial HEP for left shoulder 11-11-15   Baseline given 10-14-15   Time 4   Period Weeks   Status On-going   PT SHORT TERM GOAL #7   Title Pt will be able to sleep on left side with pain 3/10 or less 11-10-12   Time 4   Period Weeks   Status On-going   PT SHORT TERM GOAL #8   Title Pt will be able to lift arm AROM to 90 degrees in order to perform ADL's with greater ease 11-11-15   Time 4   Period Weeks   Status On-going           PT Long Term Goals - 10/22/15 1238    PT LONG TERM GOAL #1   Title "Pt will be independent with advanced HEP. for left hip and left UE 12-09-15   Time 8   Period Weeks   Status On-going   PT LONG TERM GOAL #2   Title Pt will be able to negotiate stairs with LRAD and no increase in pain in UE or hip left  12-09-15   Time 8   Period Weeks   Status On-going    PT LONG TERM GOAL #3   Title "Pt will tolerate standing and walking for 90 minutes  without increased pain in order to return to PLOF  12-09-15   Time 8   Period Weeks   Status On-going   PT LONG TERM GOAL #4   Title Pt will be at 1/10 pain or less with all functional activities and ADL's with left hip and Left UE 12-09-15   Time 8   Period Weeks   Status On-going   PT LONG TERM GOAL #5   Title LEFS will improve to at least 50 % limtation or40/80 12-09-15   Period Weeks   Status On-going   PT LONG TERM GOAL #6   Title Gait velocity will improve from 1.17 ft/sec to at least 2.62 ft/sec to show improved mobiility at community level 12-09-15   Time 8   Period Weeks   Status On-going   PT LONG TERM GOAL #7   Title left  shoulder AROM scaption will improve to 0-115 degrees for improved overhead reaching. 12-09-15   Time 8   Period Weeks   Status On-going   PT LONG TERM GOAL #8   Title "Tolerate light resistance exercises in flexion and scaption with minimal pain with left UE 12-09-15   Time 8   Period Weeks   Status On-going   PT LONG TERM GOAL  #9   TITLE Pt will be able to carry light groceries with bil UE without exacerbating pain 12-09-15   Time 8   Period Weeks   Status On-going   PT LONG TERM GOAL  #10   TITLE Pt will be able to perform ADL's independently including putting on socks 12-09-15   Time 8   Period Weeks   Status On-going               Plan - 10/24/15 1516    Clinical Impression Statement Pt reports pain at 0/10 at beginning of treatment. He is 10 minutes late today. His wife brought velcro shoulder cold pack wrap and she was instructed how to don it on the patient. Pt reports supine cane exercises are painful. Adjusted his hand palcement closer together to allow  more assist and this was much less painful. Also tried clasping hand together for pullovers. Attempted wall finger ladder and assist wall slides with hand in pillow case with good tolerance. Seated AAROM  ER reaches neutral and is comfortable when arm humerous is in neutral with thin towel between it and torso. Ice follwoing exercises to decrease soreness. Mild increase in pain with therex today.    PT Next Visit Plan check supine cane, cont scap softtissue /scap mobs, PROM,  finger ladder., UE ranger, ER AAROM, PROM        Problem List Patient Active Problem List   Diagnosis Date Noted  . Spherocytosis (familial) ([place]) 09/17/2015    Dorene Ar, PTA 10/24/2015, 3:21 PM  [facility] [address] [place], [place], [address] Phone: [phone]   Fax:  [phone]  Name: Nirvan Muccio MRN: [id number] Date of Birth: 1930-10-29

## 2015-10-29 ENCOUNTER — Ambulatory Visit: Payer: Medicare Other | Admitting: Physical Therapy

## 2015-10-29 DIAGNOSIS — M25512 Pain in left shoulder: Secondary | ICD-10-CM | POA: Diagnosis not present

## 2015-10-29 DIAGNOSIS — R262 Difficulty in walking, not elsewhere classified: Secondary | ICD-10-CM

## 2015-10-29 DIAGNOSIS — Z96642 Presence of left artificial hip joint: Secondary | ICD-10-CM

## 2015-10-29 DIAGNOSIS — M25552 Pain in left hip: Secondary | ICD-10-CM

## 2015-10-29 DIAGNOSIS — R293 Abnormal posture: Secondary | ICD-10-CM

## 2015-10-29 DIAGNOSIS — R269 Unspecified abnormalities of gait and mobility: Secondary | ICD-10-CM

## 2015-10-29 DIAGNOSIS — Z7409 Other reduced mobility: Secondary | ICD-10-CM

## 2015-10-29 DIAGNOSIS — M25612 Stiffness of left shoulder, not elsewhere classified: Secondary | ICD-10-CM

## 2015-10-29 DIAGNOSIS — R6889 Other general symptoms and signs: Secondary | ICD-10-CM

## 2015-10-29 NOTE — Therapy (Signed)
[place], [place], [address] Phone: [phone]   Fax:  [phone]  Physical Therapy Treatment  Patient Details  Name: Eric Larsen MRN: [id number] Date of Birth: [birth year] Referring Provider: Havery Moros MD  Encounter Date: 10/29/2015      PT End of Session - 10/29/15 1230    Visit Number 13   Number of Visits 25   Date for PT Re-Evaluation 11/12/15   PT Start Time [id number]   PT Stop Time 1237   PT Time Calculation (min) 49 min   Activity Tolerance Patient tolerated treatment well   Behavior During Therapy [facility] for tasks assessed/performed      No past medical history on file.  No past surgical history on file.  There were no vitals filed for this visit.  Visit Diagnosis:  Left shoulder pain  Activity intolerance  Decreased ROM of left shoulder  Abnormal posture  H/O total hip arthroplasty, left  Left hip pain  Decreased functional mobility and endurance  Decreased activity tolerance  Difficulty walking up stairs  Abnormality of gait      Subjective Assessment - 10/29/15 1152    Subjective cane exercises are still a little difficult   Patient Stated Goals I would like to walk without a limp and without an assistive device , be able to get up and down form chair and be able to lift groceries and use my left arm again   Currently in Pain? No/denies                         [facility] Adult PT Treatment/Exercise - 10/29/15 1202    Shoulder Exercises: Supine   Other Supine Exercises supine cane chest press and ER x 10 each- emphasis on scapula position   Other Supine Exercises clasped hand flexion, pullovers   Shoulder Exercises: Seated   Retraction Both;10 reps   Other Seated Exercises seated UE ranger flex and horizontal add/abd x 15   Modalities   Modalities Cryotherapy   Cryotherapy   Number Minutes Cryotherapy 10 Minutes   Cryotherapy Location Shoulder   Type of Cryotherapy Ice pack   Manual Therapy   Manual Therapy Soft tissue mobilization;Myofascial release   Soft tissue mobilization L biceps; CFM to distal L biceps   Myofascial Release L biceps                  PT Short Term Goals - 10/22/15 1106    PT SHORT TERM GOAL #1   Title "Independent with initial HEP for Hip 10-14-15   Time 4   Period Weeks   Status Achieved   PT SHORT TERM GOAL #2   Title "Demonstrate and verbalize understanding of condition management including RICE, positioning, use of A.D., HEP. 10-14-15   Time 4   Period Weeks   Status Achieved   PT SHORT TERM GOAL #3   Title "Demonstrate understanding of proper sitting posture, body mechanics, work Personal assistant, and be more conscious of position and posture throughout the day 10-14-15   Baseline wife reminds him    Time 4   Period Weeks   Status Achieved   PT SHORT TERM GOAL #4   Title Pt will decreased pain in left hip from 5/10 to 2/10 with movement 10-14-15   Baseline no/pain/ denies pain   Time 4   Period Weeks   Status Achieved   PT SHORT TERM GOAL #5   Title Pt will demonstrate  50% greater ease in rising from chair with use of bil UE support 11-11-15   Baseline Pt perfroms 5 x without UE   Time 4   Period Weeks   Status Achieved   PT SHORT TERM GOAL #6   Title Pt will be able to perform initial HEP for left shoulder 11-11-15   Baseline given 10-14-15   Time 4   Period Weeks   Status On-going   PT SHORT TERM GOAL #7   Title Pt will be able to sleep on left side with pain 3/10 or less 11-10-12   Time 4   Period Weeks   Status On-going   PT SHORT TERM GOAL #8   Title Pt will be able to lift arm AROM to 90 degrees in order to perform ADL's with greater ease 11-11-15   Time 4   Period Weeks   Status On-going           PT Long Term Goals - 10/22/15 1238    PT LONG TERM GOAL #1   Title "Pt will be independent with advanced HEP. for left hip and left UE 12-09-15   Time 8   Period Weeks    Status On-going   PT LONG TERM GOAL #2   Title Pt will be able to negotiate stairs with LRAD and no increase in pain in UE or hip left  12-09-15   Time 8   Period Weeks   Status On-going   PT LONG TERM GOAL #3   Title "Pt will tolerate standing and walking for 90 minutes  without increased pain in order to return to PLOF  12-09-15   Time 8   Period Weeks   Status On-going   PT LONG TERM GOAL #4   Title Pt will be at 1/10 pain or less with all functional activities and ADL's with left hip and Left UE 12-09-15   Time 8   Period Weeks   Status On-going   PT LONG TERM GOAL #5   Title LEFS will improve to at least 50 % limtation or40/80 12-09-15   Period Weeks   Status On-going   PT LONG TERM GOAL #6   Title Gait velocity will improve from 1.17 ft/sec to at least 2.62 ft/sec to show improved mobiility at community level 12-09-15   Time 8   Period Weeks   Status On-going   PT LONG TERM GOAL #7   Title left  shoulder AROM scaption will improve to 0-115 degrees for improved overhead reaching. 12-09-15   Time 8   Period Weeks   Status On-going   PT LONG TERM GOAL #8   Title "Tolerate light resistance exercises in flexion and scaption with minimal pain with left UE 12-09-15   Time 8   Period Weeks   Status On-going   PT LONG TERM GOAL  #9   TITLE Pt will be able to carry light groceries with bil UE without exacerbating pain 12-09-15   Time 8   Period Weeks   Status On-going   PT LONG TERM GOAL  #10   TITLE Pt will be able to perform ADL's independently including putting on socks 12-09-15   Time 8   Period Weeks   Status On-going               Plan - 10/29/15 1232    Clinical Impression Statement Pt with 0/10 pain at end of session.  Pt concerned about elbow extension so focused part of session on  manual and L biceps.  Pt will continue to benefit from PT to maximize function.   PT Next Visit Plan check supine cane, cont scap softtissue /scap mobs, PROM,  finger ladder., UE  ranger, ER AAROM, PROM   PT Home Exercise Plan Pendulum, posture, cryotherapy. and elbow ext, and gentle ROM for shoulderwith use of table and modifications        Problem List Patient Active Problem List   Diagnosis Date Noted  . Spherocytosis (familial) ([place]) 09/17/2015   Laureen Abrahams, PT, DPT 10/29/2015 12:43 PM  [facility] [address] [place], [place], [address] Phone: [phone]   Fax:  [phone]  Name: Hollan Sanangelo MRN: [id number] Date of Birth: 1930-11-29

## 2015-10-31 ENCOUNTER — Ambulatory Visit: Payer: Medicare Other | Admitting: Physical Therapy

## 2015-10-31 DIAGNOSIS — M25512 Pain in left shoulder: Secondary | ICD-10-CM

## 2015-10-31 DIAGNOSIS — M25612 Stiffness of left shoulder, not elsewhere classified: Secondary | ICD-10-CM

## 2015-10-31 DIAGNOSIS — R293 Abnormal posture: Secondary | ICD-10-CM

## 2015-10-31 DIAGNOSIS — R6889 Other general symptoms and signs: Secondary | ICD-10-CM

## 2015-10-31 NOTE — Therapy (Signed)
[place], [place], [address] Phone: [phone]   Fax:  [phone]  Physical Therapy Treatment  Patient Details  Name: Eric Larsen MRN: [id number] Date of Birth: 1931/05/18 Referring Provider: Havery Moros MD  Encounter Date: 10/31/2015      PT End of Session - 10/31/15 1234    Visit Number 14   Number of Visits 25   Date for PT Re-Evaluation 11/12/15   Authorization Type BCBS Medicare   Authorization Time Period 12-09-15 shoulder   PT Start Time 1152   PT Stop Time 1250   PT Time Calculation (min) 58 min   Activity Tolerance Patient tolerated treatment well   Behavior During Therapy [facility] for tasks assessed/performed      No past medical history on file.  No past surgical history on file.  There were no vitals filed for this visit.  Visit Diagnosis:  Left shoulder pain  Activity intolerance  Decreased ROM of left shoulder  Abnormal posture  Decreased activity tolerance      Subjective Assessment - 10/31/15 1155    Subjective feels like he's getting more flexibility and motion back.   Patient Stated Goals I would like to walk without a limp and without an assistive device , be able to get up and down form chair and be able to lift groceries and use my left arm again   Currently in Pain? No/denies            [facility] PT Assessment - 10/31/15 0001    PROM   Left Shoulder Flexion 90 Degrees   Left Shoulder ABduction 85 Degrees  supine   Left Shoulder Internal Rotation 78 Degrees   Left Shoulder External Rotation 5 Degrees  shoulder ~ 30 degrees abdct   Left Elbow Extension 8                     [facility] Adult PT Treatment/Exercise - 10/31/15 1157    Shoulder Exercises: Supine   Other Supine Exercises supine cane chest press and ER x 10 each- emphasis on scapula position   Modalities   Modalities Cryotherapy   Cryotherapy   Number Minutes Cryotherapy 10 Minutes   Cryotherapy Location Shoulder   Type of Cryotherapy Ice pack   Manual Therapy   Manual Therapy Soft tissue mobilization;Passive ROM   Soft tissue mobilization L biceps; CFM to distal L biceps   Myofascial Release L biceps   Passive ROM PROM of left shoulder in all planes to pt tolerance                  PT Short Term Goals - 10/22/15 1106    PT SHORT TERM GOAL #1   Title "Independent with initial HEP for Hip 10-14-15   Time 4   Period Weeks   Status Achieved   PT SHORT TERM GOAL #2   Title "Demonstrate and verbalize understanding of condition management including RICE, positioning, use of A.D., HEP. 10-14-15   Time 4   Period Weeks   Status Achieved   PT SHORT TERM GOAL #3   Title "Demonstrate understanding of proper sitting posture, body mechanics, work Personal assistant, and be more conscious of position and posture throughout the day 10-14-15   Baseline wife reminds him    Time 4   Period Weeks   Status Achieved   PT SHORT TERM GOAL #4   Title Pt will decreased pain in left hip from 5/10 to 2/10 with movement  10-14-15   Baseline no/pain/ denies pain   Time 4   Period Weeks   Status Achieved   PT SHORT TERM GOAL #5   Title Pt will demonstrate 50% greater ease in rising from chair with use of bil UE support 11-11-15   Baseline Pt perfroms 5 x without UE   Time 4   Period Weeks   Status Achieved   PT SHORT TERM GOAL #6   Title Pt will be able to perform initial HEP for left shoulder 11-11-15   Baseline given 10-14-15   Time 4   Period Weeks   Status On-going   PT SHORT TERM GOAL #7   Title Pt will be able to sleep on left side with pain 3/10 or less 11-10-12   Time 4   Period Weeks   Status On-going   PT SHORT TERM GOAL #8   Title Pt will be able to lift arm AROM to 90 degrees in order to perform ADL's with greater ease 11-11-15   Time 4   Period Weeks   Status On-going           PT Long Term Goals - 10/22/15 1238    PT LONG TERM GOAL #1   Title "Pt will be  independent with advanced HEP. for left hip and left UE 12-09-15   Time 8   Period Weeks   Status On-going   PT LONG TERM GOAL #2   Title Pt will be able to negotiate stairs with LRAD and no increase in pain in UE or hip left  12-09-15   Time 8   Period Weeks   Status On-going   PT LONG TERM GOAL #3   Title "Pt will tolerate standing and walking for 90 minutes  without increased pain in order to return to PLOF  12-09-15   Time 8   Period Weeks   Status On-going   PT LONG TERM GOAL #4   Title Pt will be at 1/10 pain or less with all functional activities and ADL's with left hip and Left UE 12-09-15   Time 8   Period Weeks   Status On-going   PT LONG TERM GOAL #5   Title LEFS will improve to at least 50 % limtation or40/80 12-09-15   Period Weeks   Status On-going   PT LONG TERM GOAL #6   Title Gait velocity will improve from 1.17 ft/sec to at least 2.62 ft/sec to show improved mobiility at community level 12-09-15   Time 8   Period Weeks   Status On-going   PT LONG TERM GOAL #7   Title left  shoulder AROM scaption will improve to 0-115 degrees for improved overhead reaching. 12-09-15   Time 8   Period Weeks   Status On-going   PT LONG TERM GOAL #8   Title "Tolerate light resistance exercises in flexion and scaption with minimal pain with left UE 12-09-15   Time 8   Period Weeks   Status On-going   PT LONG TERM GOAL  #9   TITLE Pt will be able to carry light groceries with bil UE without exacerbating pain 12-09-15   Time 8   Period Weeks   Status On-going   PT LONG TERM GOAL  #10   TITLE Pt will be able to perform ADL's independently including putting on socks 12-09-15   Time 8   Period Weeks   Status On-going  Plan - 10/31/15 1234    Clinical Impression Statement L shoulder PROM improved all directions; external rotation continues to be most limited motion.  Progressing well with PT.   PT Next Visit Plan check supine cane, cont scap softtissue /scap mobs,  PROM,  finger ladder., UE ranger, ER AAROM, PROM   PT Home Exercise Plan Pendulum, posture, cryotherapy. and elbow ext, and gentle ROM for shoulderwith use of table and modifications   Consulted and Agree with Plan of Care Patient;Family member/caregiver   Family Member Consulted wife        Problem List Patient Active Problem List   Diagnosis Date Noted  . Spherocytosis (familial) ([place]) 09/17/2015   Laureen Abrahams, PT, DPT 10/31/2015 1:10 PM  [facility] [address] [place], [place], [address] Phone: [phone]   Fax:  [phone]  Name: Eric Larsen MRN: [id number] Date of Birth: 09/19/1930

## 2015-11-04 ENCOUNTER — Ambulatory Visit: Payer: Medicare Other | Admitting: Physical Therapy

## 2015-11-04 DIAGNOSIS — M25612 Stiffness of left shoulder, not elsewhere classified: Secondary | ICD-10-CM

## 2015-11-04 DIAGNOSIS — Z7409 Other reduced mobility: Secondary | ICD-10-CM

## 2015-11-04 DIAGNOSIS — R6889 Other general symptoms and signs: Secondary | ICD-10-CM

## 2015-11-04 DIAGNOSIS — M25512 Pain in left shoulder: Secondary | ICD-10-CM

## 2015-11-04 DIAGNOSIS — R269 Unspecified abnormalities of gait and mobility: Secondary | ICD-10-CM

## 2015-11-04 DIAGNOSIS — R293 Abnormal posture: Secondary | ICD-10-CM

## 2015-11-04 DIAGNOSIS — R262 Difficulty in walking, not elsewhere classified: Secondary | ICD-10-CM

## 2015-11-04 DIAGNOSIS — M25552 Pain in left hip: Secondary | ICD-10-CM

## 2015-11-04 DIAGNOSIS — Z96642 Presence of left artificial hip joint: Secondary | ICD-10-CM

## 2015-11-04 NOTE — Therapy (Signed)
[place], [place], [address] Phone: [phone]   Fax:  [phone]  Physical Therapy Treatment  Patient Details  Name: Eric Larsen MRN: [id number] Date of Birth: 1931/03/07 Referring Provider: Havery Moros MD  Encounter Date: 11/04/2015      PT End of Session - 11/04/15 1359    Visit Number 15   Number of Visits 25   Date for PT Re-Evaluation 11/12/15   Authorization Type BCBS Medicare   Authorization Time Period 12-09-15 shoulder   PT Start Time 1152   PT Stop Time 1259   PT Time Calculation (min) 67 min      No past medical history on file.  No past surgical history on file.  There were no vitals filed for this visit.  Visit Diagnosis:  Left shoulder pain  Abnormal posture  Decreased ROM of left shoulder  Activity intolerance  Decreased functional mobility and endurance  Difficulty walking up stairs  Abnormality of gait  Decreased activity tolerance  H/O total hip arthroplasty, left  Left hip pain          [facility] PT Assessment - 11/04/15 0001    PROM   Left Shoulder Flexion 90 Degrees   Left Shoulder External Rotation 18 Degrees  neutral                     [facility] Adult PT Treatment/Exercise - 11/04/15 0001    Knee/Hip Exercises: Standing   Stairs 18 best left, 55 sec right   Other Standing Knee Exercises backward walking- heels are rubbing, cues to keep left foot neutral, side stepping focusing on neutral    Shoulder Exercises: Seated   Flexion AAROM   Flexion Limitations review of table slides 30x 30 seconds thumbs up, pt reports he misunderstood instruction and has stopped doing this exercise.    Shoulder Exercises: ROM/Strengthening   Other ROM/Strengthening Exercises Standing with hand on inside of doorway turning shoulder for bicep stretch. Also review of supine elbow extension with wife giving manual over pressure, also instructed pt's wife is PROM  for shoulder ER from neutral 3 x 30 each with good technique and achieving 18 degrees of PROM ER   Modalities   Modalities Cryotherapy   Cryotherapy   Number Minutes Cryotherapy 15 Minutes   Cryotherapy Location Shoulder   Type of Cryotherapy Ice pack   Manual Therapy   Passive ROM PROM of left shoulder in to pt tolerance flexion and ER as well as elbow extension                  PT Short Term Goals - 10/22/15 1106    PT SHORT TERM GOAL #1   Title "Independent with initial HEP for Hip 10-14-15   Time 4   Period Weeks   Status Achieved   PT SHORT TERM GOAL #2   Title "Demonstrate and verbalize understanding of condition management including RICE, positioning, use of A.D., HEP. 10-14-15   Time 4   Period Weeks   Status Achieved   PT SHORT TERM GOAL #3   Title "Demonstrate understanding of proper sitting posture, body mechanics, work ergonomics, and be more conscious of position and posture throughout the day 10-14-15   Baseline wife reminds him    Time 4   Period Weeks   Status Achieved   PT SHORT TERM GOAL #4   Title Pt will decreased pain in left hip from 5/10 to 2/10 with movement 10-14-15  Baseline no/pain/ denies pain   Time 4   Period Weeks   Status Achieved   PT SHORT TERM GOAL #5   Title Pt will demonstrate 50% greater ease in rising from chair with use of bil UE support 11-11-15   Baseline Pt perfroms 5 x without UE   Time 4   Period Weeks   Status Achieved   PT SHORT TERM GOAL #6   Title Pt will be able to perform initial HEP for left shoulder 11-11-15   Baseline given 10-14-15   Time 4   Period Weeks   Status On-going   PT SHORT TERM GOAL #7   Title Pt will be able to sleep on left side with pain 3/10 or less 11-10-12   Time 4   Period Weeks   Status On-going   PT SHORT TERM GOAL #8   Title Pt will be able to lift arm AROM to 90 degrees in order to perform ADL's with greater ease 11-11-15   Time 4   Period Weeks   Status On-going           PT  Long Term Goals - 11/04/15 1357    PT LONG TERM GOAL #1   Title "Pt will be independent with advanced HEP. for left hip and left UE 12-09-15   Time 8   Period Weeks   Status On-going   PT LONG TERM GOAL #2   Title Pt will be able to negotiate stairs with LRAD and no increase in pain in UE or hip left  12-09-15   Time 8   Period Weeks   Status On-going   PT LONG TERM GOAL #3   Title "Pt will tolerate standing and walking for 90 minutes  without increased pain in order to return to PLOF  12-09-15   Time 8   Period Weeks   Status On-going   PT LONG TERM GOAL #4   Title Pt will be at 1/10 pain or less with all functional activities and ADL's with left hip and Left UE 12-09-15   Time 8   Period Weeks   Status On-going   PT LONG TERM GOAL #5   Title LEFS will improve to at least 50 % limtation or40/80 12-09-15   Period Weeks   Status On-going   PT LONG TERM GOAL #6   Title Gait velocity will improve from 1.17 ft/sec to at least 2.62 ft/sec to show improved mobiility at community level 12-09-15   Baseline 2.22 ft/sec with straight point cane   Time 8   Period Weeks   Status On-going   PT LONG TERM GOAL #7   Title left  shoulder AROM scaption will improve to 0-115 degrees for improved overhead reaching. 12-09-15   Time 8   Period Weeks   Status On-going   PT LONG TERM GOAL #8   Title "Tolerate light resistance exercises in flexion and scaption with minimal pain with left UE 12-09-15   Time 8   Period Weeks   Status On-going   PT LONG TERM GOAL  #9   TITLE Pt will be able to carry light groceries with bil UE without exacerbating pain 12-09-15   Time 8   Period Weeks   Status On-going   PT LONG TERM GOAL  #10   TITLE Pt will be able to perform ADL's independently including putting on socks 12-09-15   Time 8   Period Weeks   Status On-going  Plan - 11/04/15 1418    Clinical Impression Statement Pt reports he stopped doing table slides. Reviewed them today and  added back to HEP. Instructed pt's wife in elbow extension with over pressure as well as instructed her in PROM for shoulder ER from neutral. SLS limited to 1 sec at beginning of tx on left however progressed to 18 seconds by end of treatment. Pt reports he has not been trying without UE support.    PT Next Visit Plan CHECK ALL GOALS        Problem List Patient Active Problem List   Diagnosis Date Noted  . Spherocytosis (familial) ([place]) 09/17/2015    Dorene Ar, PTA 11/04/2015, 2:34 PM  [facility] [address] [place], [place], [address] Phone: [phone]   Fax:  [phone]  Name: Eric Larsen MRN: [id number] Date of Birth: 1931/06/12

## 2015-11-06 ENCOUNTER — Encounter: Payer: Medicare Other | Admitting: Physical Therapy

## 2015-11-06 ENCOUNTER — Ambulatory Visit: Payer: Medicare Other | Admitting: Physical Therapy

## 2015-11-06 DIAGNOSIS — M25512 Pain in left shoulder: Secondary | ICD-10-CM | POA: Diagnosis not present

## 2015-11-06 DIAGNOSIS — R262 Difficulty in walking, not elsewhere classified: Secondary | ICD-10-CM

## 2015-11-06 DIAGNOSIS — Z96642 Presence of left artificial hip joint: Secondary | ICD-10-CM

## 2015-11-06 DIAGNOSIS — R293 Abnormal posture: Secondary | ICD-10-CM

## 2015-11-06 DIAGNOSIS — M25552 Pain in left hip: Secondary | ICD-10-CM

## 2015-11-06 DIAGNOSIS — Z7409 Other reduced mobility: Secondary | ICD-10-CM

## 2015-11-06 DIAGNOSIS — M25612 Stiffness of left shoulder, not elsewhere classified: Secondary | ICD-10-CM

## 2015-11-06 DIAGNOSIS — R6889 Other general symptoms and signs: Secondary | ICD-10-CM

## 2015-11-06 DIAGNOSIS — R269 Unspecified abnormalities of gait and mobility: Secondary | ICD-10-CM

## 2015-11-06 NOTE — Therapy (Signed)
[place], [place], [address] Phone: [phone]   Fax:  [phone]  Physical Therapy Treatment  Patient Details  Name: Eric Larsen MRN: [id number] Date of Birth: 06/24/1931 Referring Provider: Havery Moros MD  Encounter Date: 11/06/2015      PT End of Session - 11/06/15 1254    Visit Number 16   Number of Visits 25   Date for PT Re-Evaluation 11/12/15   Authorization Type BCBS Medicare   Authorization Time Period 12-09-15 shoulder   PT Start Time 1149   PT Stop Time 1245   PT Time Calculation (min) 56 min      No past medical history on file.  No past surgical history on file.  There were no vitals filed for this visit.  Visit Diagnosis:  Left shoulder pain  Abnormal posture  Decreased ROM of left shoulder  Activity intolerance  Decreased functional mobility and endurance  Difficulty walking up stairs  Left hip pain  H/O total hip arthroplasty, left  Decreased activity tolerance  Abnormality of gait      Subjective Assessment - 11/06/15 1255    Subjective Just saw my MD. I have new orders for you.    Currently in Pain? No/denies            [facility] PT Assessment - 11/06/15 0001    PROM   Left Shoulder Flexion 95 Degrees   Left Shoulder External Rotation 25 Degrees  from neutral                     [facility] Adult PT Treatment/Exercise - 11/06/15 0001    Shoulder Exercises: Supine   Other Supine Exercises Manual resistance for ER isometrics 5 seconds x 10   Shoulder Exercises: Pulleys   Flexion 2 minutes   Flexion Limitations cues for scapula postion and posture   Shoulder Exercises: ROM/Strengthening   [place] Wall slides x 5 using assist from RUE- unable to attain neutral ER   Modalities   Modalities Cryotherapy   Cryotherapy   Number Minutes Cryotherapy 10 Minutes   Cryotherapy Location Shoulder   Type of Cryotherapy Ice pack   Manual Therapy   Soft tissue mobilization Sub scap, Teres with instruction to wife on how to massage.    Passive ROM PROM of left shoulder in to pt tolerance flexion and ER as well as elbow extension  contract relax for ER                  PT Short Term Goals - 11/06/15 1312    PT SHORT TERM GOAL #1   Title "Independent with initial HEP for Hip 10-14-15   Status Achieved   PT SHORT TERM GOAL #2   Title "Demonstrate and verbalize understanding of condition management including RICE, positioning, use of A.D., HEP. 10-14-15   Status Achieved   PT SHORT TERM GOAL #3   Title "Demonstrate understanding of proper sitting posture, body mechanics, work ergonomics, and be more conscious of position and posture throughout the day 10-14-15   Status Achieved   PT SHORT TERM GOAL #4   Title Pt will decreased pain in left hip from 5/10 to 2/10 with movement 10-14-15   Status Achieved   PT SHORT TERM GOAL #5   Title Pt will demonstrate 50% greater ease in rising from chair with use of bil UE support 11-11-15   Status Achieved   PT SHORT TERM GOAL #6   Title Pt  will be able to perform initial HEP for left shoulder 11-11-15   Time 4   Period Weeks   Status Achieved   PT SHORT TERM GOAL #7   Title Pt will be able to sleep on left side with pain 3/10 or less 11-10-12   Time 4   Period Weeks   Status On-going   PT SHORT TERM GOAL #8   Title Pt will be able to lift arm AROM to 90 degrees in order to perform ADL's with greater ease 11-11-15   Time 4   Period Weeks   Status On-going           PT Long Term Goals - 11/04/15 1357    PT LONG TERM GOAL #1   Title "Pt will be independent with advanced HEP. for left hip and left UE 12-09-15   Time 8   Period Weeks   Status On-going   PT LONG TERM GOAL #2   Title Pt will be able to negotiate stairs with LRAD and no increase in pain in UE or hip left  12-09-15   Time 8   Period Weeks   Status On-going   PT LONG TERM GOAL #3   Title "Pt will tolerate standing  and walking for 90 minutes  without increased pain in order to return to PLOF  12-09-15   Time 8   Period Weeks   Status On-going   PT LONG TERM GOAL #4   Title Pt will be at 1/10 pain or less with all functional activities and ADL's with left hip and Left UE 12-09-15   Time 8   Period Weeks   Status On-going   PT LONG TERM GOAL #5   Title LEFS will improve to at least 50 % limtation or40/80 12-09-15   Period Weeks   Status On-going   PT LONG TERM GOAL #6   Title Gait velocity will improve from 1.17 ft/sec to at least 2.62 ft/sec to show improved mobiility at community level 12-09-15   Baseline 2.22 ft/sec with straight point cane   Time 8   Period Weeks   Status On-going   PT LONG TERM GOAL #7   Title left  shoulder AROM scaption will improve to 0-115 degrees for improved overhead reaching. 12-09-15   Time 8   Period Weeks   Status On-going   PT LONG TERM GOAL #8   Title "Tolerate light resistance exercises in flexion and scaption with minimal pain with left UE 12-09-15   Time 8   Period Weeks   Status On-going   PT LONG TERM GOAL  #9   TITLE Pt will be able to carry light groceries with bil UE without exacerbating pain 12-09-15   Time 8   Period Weeks   Status On-going   PT LONG TERM GOAL  #10   TITLE Pt will be able to perform ADL's independently including putting on socks 12-09-15   Time 8   Period Weeks   Status On-going               Plan - 11/06/15 1308    Clinical Impression Statement Pt presents after just leaving MD office. He has new oders to begin gentle resistance with LUE. Focused today on PROM and instructing pt's wife on manual for Subscap and teres. Pt reports no tenderness or pain but does feel the tightness in this area with attempts at stretching. Gentle [employer] for ER with manual resistance and contract relax to improve ER  ROM. Pt most comfortable with ER PROM with UE at side. He feels "electrical shocks" of pain when attempt to perform ROM in  abduction. Began pulleys today for overhead stretch with pt reporting increased shoulder pain. Cues to keep  a comfortable ROM and cues for posture and scap rhythm. PROM slowly improving. STG#6 MET.    PT Next Visit Plan CHECK ALL GOALS, check stair climbing with cane in LUE?         Problem List Patient Active Problem List   Diagnosis Date Noted  . Spherocytosis (familial) ([place]) 09/17/2015    Dorene Ar, PTA 11/06/2015, 1:29 PM  [facility] [address] [place], [place], [address] Phone: [phone]   Fax:  [phone]  Name: Eric Larsen MRN: [id number] Date of Birth: 1931-03-27

## 2015-11-12 ENCOUNTER — Ambulatory Visit: Payer: Medicare Other | Admitting: Physical Therapy

## 2015-11-12 DIAGNOSIS — M25512 Pain in left shoulder: Secondary | ICD-10-CM

## 2015-11-12 DIAGNOSIS — M25552 Pain in left hip: Secondary | ICD-10-CM

## 2015-11-12 DIAGNOSIS — Z7409 Other reduced mobility: Secondary | ICD-10-CM

## 2015-11-12 DIAGNOSIS — R269 Unspecified abnormalities of gait and mobility: Secondary | ICD-10-CM

## 2015-11-12 DIAGNOSIS — R6889 Other general symptoms and signs: Secondary | ICD-10-CM

## 2015-11-12 DIAGNOSIS — M25612 Stiffness of left shoulder, not elsewhere classified: Secondary | ICD-10-CM

## 2015-11-12 DIAGNOSIS — R262 Difficulty in walking, not elsewhere classified: Secondary | ICD-10-CM

## 2015-11-12 DIAGNOSIS — D580 Hereditary spherocytosis: Secondary | ICD-10-CM

## 2015-11-12 DIAGNOSIS — Z96642 Presence of left artificial hip joint: Secondary | ICD-10-CM

## 2015-11-12 DIAGNOSIS — R293 Abnormal posture: Secondary | ICD-10-CM

## 2015-11-12 NOTE — Patient Instructions (Addendum)
Trigger Point Dry Needling  . What is Trigger Point Dry Needling (DN)? o DN is a physical therapy technique used to treat muscle pain and dysfunction. Specifically, DN helps deactivate muscle trigger points (muscle knots).  o A thin filiform needle is used to penetrate the skin and stimulate the underlying trigger point. The goal is for a local twitch response (LTR) to occur and for the trigger point to relax. No medication of any kind is injected during the procedure.   . What Does Trigger Point Dry Needling Feel Like?  o The procedure feels different for each individual patient. Some patients report that they do not actually feel the needle enter the skin and overall the process is not painful. Very mild bleeding may occur. However, many patients feel a deep cramping in the muscle in which the needle was inserted. This is the local twitch response.   Marland Kitchen How Will I feel after the treatment? o Soreness is normal, and the onset of soreness may not occur for a few hours. Typically this soreness does not last longer than two days.  o Bruising is uncommon, however; ice can be used to decrease any possible bruising.  o In rare cases feeling tired or nauseous after the treatment is normal. In addition, your symptoms may get worse before they get better, this period will typically not last longer than 24 hours.   . What Can I do After My Treatment? o Increase your hydration by drinking more water for the next 24 hours. o You may place ice or heat on the areas treated that have become sore, however, do not use heat on inflamed or bruised areas. Heat often brings more relief post needling. o You can continue your regular activities, but vigorous activity is not recommended initially after the treatment for 24 hours. o DN is best combined with other physical therapy such as strengthening, stretching, and other therapies.   Pt has  Small physio ball at home.  Use small one between scapula and lie down with  left upper extremity in stop sign position.. Use to open up chest with arms  And lie on back with arms in stop sign position as shown in clinic Voncille Lo, PT 11/12/2015 10:45 AM Phone: [phone] Fax: [phone]

## 2015-11-12 NOTE — Therapy (Signed)
[place], [place], [address] Phone: [phone]   Fax:  [phone]  Physical Therapy Treatment  Patient Details  Name: Eric Larsen MRN: [id number] Date of Birth: [birth year] Referring Provider: Havery Moros MD  Encounter Date: 11/12/2015      PT End of Session - 11/12/15 1032    Visit Number 17   Number of Visits 41   Date for PT Re-Evaluation 01/07/16   Authorization Type BCBS Medicare  revaluation on 01-07-16   Authorization Time Period 12-09-15 shoulder   PT Start Time 1018   PT Stop Time 1130   PT Time Calculation (min) 72 min   Activity Tolerance Patient tolerated treatment well   Behavior During Therapy [facility] for tasks assessed/performed      No past medical history on file.  No past surgical history on file.  There were no vitals filed for this visit.  Visit Diagnosis:  Left shoulder pain  Decreased activity tolerance  Abnormality of gait  Abnormal posture  Decreased ROM of left shoulder  Spherocytosis (familial) (HCC)  Activity intolerance  Decreased functional mobility and endurance  Difficulty walking up stairs  Left hip pain  H/O total hip arthroplasty, left      Subjective Assessment - 11/12/15 1031    Subjective I am able to bear weight and begin resistant exericises now.  I am walking with a cane but sometimes I do not use in the house   Pertinent History L THA on 08-25-15  and ORIF of Left shoulder on 08-25-15/ Rehab for Left shoulder to begin 10-14-15  AROM-  no resistance   Limitations Lifting;Sitting;Standing;Walking;House hold activities   How long can you sit comfortably? 2 hour   How long can you stand comfortably? 1 hour   How long can you walk comfortably?  1 hour at [facility] and using cart   Diagnostic tests x ray, MRI   Patient Stated Goals I would like to walk without a limp and without an assistive device , be able to get up and down form chair and be able  to lift groceries and use my left arm again   Currently in Pain? No/denies   Pain Score 5  with movement  rest 0/10   Pain Location Shoulder   Pain Orientation Left   Pain Descriptors / Indicators Aching;Sharp   Pain Type Chronic pain   Pain Onset More than a month ago   Pain Frequency Intermittent   Aggravating Factors  raising arm at shoulder level            [facility] PT Assessment - 11/12/15 1041    Observation/Other Assessments   Lower Extremity Functional Scale  49/80 or 39 % limitation   AROM   Right Shoulder Extension 40 Degrees   Right Shoulder Flexion 128 Degrees   Right Shoulder ABduction 120 Degrees   Right Shoulder Internal Rotation 58 Degrees   Right Shoulder External Rotation 58 Degrees   Right Shoulder Horizontal ABduction 88 Degrees   Right Shoulder Horizontal  ADduction 130 Degrees   Left Shoulder Extension 40 Degrees   Left Shoulder Flexion 82 Degrees  ERP  89 post trigger point dry needling   Left Shoulder ABduction 70 Degrees  ERP  75 post dry needling   Left Shoulder Internal Rotation 34 Degrees   wigth abd at 45degrees   Left Shoulder External Rotation 15 Degrees  ERP post dry needling abd at 45    Right Elbow Extension 0  Left Elbow Extension 10   Right Hip Flexion 110   Right Hip External Rotation  35   Right Hip Internal Rotation  25   Left Hip Flexion 100   Left Hip External Rotation  25   Left Hip Internal Rotation  16   Right Knee Extension 5   Right Knee Flexion 140   Left Knee Extension 3   Left Knee Flexion 125   PROM   Left Shoulder Flexion 115 Degrees   Left Shoulder ABduction 90 Degrees  supine post dry needling   Left Shoulder Internal Rotation 78 Degrees   Left Shoulder External Rotation 25 Degrees  from neutral, with abd 45 degrees 15   Left Elbow Extension 3   Strength   Overall Strength Comments Right UE grossly 4/5   Left Shoulder Flexion 3-/5   Left Shoulder Extension 3-/5   Left Shoulder ABduction 3-/5   Left  Shoulder Internal Rotation 4-/5   Left Shoulder External Rotation 3-/5   Right Hand Grip (lbs) 68, 75, 67   Left Hand Grip (lbs) 62, 62, 63    Right Hip Flexion 4/5   Right Hip Extension 4/5   Right Hip ABduction 4-/5   Left Hip Flexion 3/5   Left Hip Extension 3/5   Left Hip ABduction 3+/5   Right Knee Flexion 4+/5   Right Knee Extension 4/5   Left Knee Flexion 4/5   Left Knee Extension 4/5   Ambulation/Gait   Ambulation/Gait Yes   Ambulation/Gait Assistance 7: Independent   Ambulation Distance (Feet) 200 Feet   Assistive device None   uses SPC for safety in community   Gait Pattern Decreased stride length;Decreased hip/knee flexion - left   Ambulation Surface Level   Gait velocity 2.82 ft/sec   Stair Management Technique One rail Right;Alternating pattern   Number of Stairs 16   Height of Stairs 6   Gait Comments Pt with slight antalgic gait                     [facility] Adult PT Treatment/Exercise - 11/12/15 1041    Knee/Hip Exercises: Standing   Lateral Step Up 1 set;10 reps;Left;Hand Hold: 1   Lateral Step Up Limitations fatigues at 10th rep   Forward Step Up Left;1 set;10 reps;Step Height: 6";Hand Hold: 1   Other Standing Knee Exercises standing with wt shift and UE  flex and abd to pt tolerance.  Wall clock  on L UE to 7 and 8 oclock with Right trunk rotation.      Other Standing Knee Exercises mini squat x 10    Knee/Hip Exercises: Seated   Sit to Sand 10 reps  no UE support needed   Knee/Hip Exercises: Supine   Other Supine Knee/Hip Exercises seated Total motiton release with left trunk rotation to left 4 x 15 sec and then left for 2 x 15 sec.    Shoulder Exercises: Standing   Other Standing Exercises wall standing push ups to pt tolerance and VC for extension of left elbow x 10   Modalities   Modalities Moist Heat   Moist Heat Therapy   Number Minutes Moist Heat 15 Minutes   Moist Heat Location Shoulder  left pt with ball in between scapula and in  stop sign L   Manual Therapy   Manual Therapy Soft tissue mobilization;Passive ROM   Soft tissue mobilization subscapularis, teres major and latissimus on left   Scapular Mobilization inf/ sup glide grade 3 and rotational  combination for very tight scapular movement.     Passive ROM PROM of left shoulder in to pt tolerance flexion and ER as well as elbow extension  contract relax for ER          Trigger Point Dry Needling - 11/12/15 1037    Consent Given? Yes   Education Handout Provided Yes   Muscles Treated Upper Body Subscapularis  teres major and latissimus left side only   Subscapularis Response Twitch response elicited;Palpable increased muscle length              PT Education - 11/12/15 1112    Education provided Yes   Education Details Pt educated on stairs without cane and use of rail with Left UE with step over step technique.  educated on trigger point  dry needling precautians and aftercare and given handout   Person(s) Educated Patient;Spouse   Methods Explanation;Demonstration;Handout;Verbal cues   Comprehension Verbalized understanding;Returned demonstration          PT Short Term Goals - 11/12/15 1038    PT SHORT TERM GOAL #1   Title "Independent with initial HEP for Hip 10-14-15   Time 4   Period Weeks   Status Achieved   PT SHORT TERM GOAL #2   Title "Demonstrate and verbalize understanding of condition management including RICE, positioning, use of A.D., HEP. 10-14-15   Time 4   Period Weeks   Status Achieved   PT SHORT TERM GOAL #3   Title "Demonstrate understanding of proper sitting posture, body mechanics, work ergonomics, and be more conscious of position and posture throughout the day 10-14-15   Baseline wife reminds him    Time 4   Period Weeks   Status Achieved   PT SHORT TERM GOAL #4   Title Pt will decreased pain in left hip from 5/10 to 2/10 with movement 10-14-15   Baseline no/pain/ denies pain   Time 4   Period Weeks   Status  Achieved   PT SHORT TERM GOAL #5   Title Pt will demonstrate 50% greater ease in rising from chair with use of bil UE support 11-11-15   Baseline Pt perfroms 10 x without UE   Time 4   Period Weeks   Status Achieved   PT SHORT TERM GOAL #6   Title Pt will be able to perform initial HEP for left shoulder 11-11-15   Baseline given 10-14-15 and added stretches to now.  Can add resistancew   Time 4   Period Weeks   Status Achieved   PT SHORT TERM GOAL #7   Title Pt will be able to sleep on left side with pain 3/10 or less 11-10-12   Baseline only sleeps on back,  unable to sleep on left shoulder   Time 4   Period Weeks   Status On-going   PT SHORT TERM GOAL #8   Title Pt will be able to lift arm AROM to 90 degrees in order to perform ADL's with greater ease 11-11-15   Time 4   Period Weeks   Status On-going           PT Long Term Goals - 11/12/15 1042    PT LONG TERM GOAL #1   Title "Pt will be independent with advanced HEP. for left hip and left UE 12-09-15   Time 8   Period Weeks   Status On-going   PT LONG TERM GOAL #2   Title Pt will be able to negotiate stairs with  LRAD and no increase in pain in UE or hip left  12-09-15   Baseline no cane and using Left UE for stairrail   Time 8   Period Weeks   Status Achieved   PT LONG TERM GOAL #3   Title "Pt will tolerate standing and walking for 90 minutes  without increased pain in order to return to PLOF  12-09-15   Baseline standing for 1 hour with cane now   Time 8   Period Weeks   Status On-going   PT LONG TERM GOAL #4   Title Pt will be at 1/10 pain or less with all functional activities and ADL's with left hip and Left UE 12-09-15   Baseline 0/10 with hip  but not for UE  5/10   Time 8   Status On-going   PT LONG TERM GOAL #5   Title LEFS will improve to at least 50 % limtation or 49/80 12-09-15   Baseline LEFS at 39%   or 49/80   Time 8   Period Weeks   Status Achieved   PT LONG TERM GOAL #6   Title Gait velocity will  improve from 1.17 ft/sec to at least 2.62 ft/sec to show improved mobiility at community level 12-09-15   Baseline 2.82 ft/sec without SPC , no assistive device   Time 8   Period Weeks   Status Achieved   PT LONG TERM GOAL #7   Title left  shoulder AROM scaption will improve to 0-115 degrees for improved overhead reaching. 12-09-15   Baseline Left shoulder flex 89/  abd 75   Time 8   Period Weeks   Status On-going   PT LONG TERM GOAL #8   Title "Tolerate light resistance exercises in flexion and scaption with minimal pain with left UE 12-09-15   Baseline just beginning resistive exericise on 11-12-15   Time 8   Period Weeks   Status On-going   PT LONG TERM GOAL  #9   TITLE Pt will be able to carry light groceries with bil UE without exacerbating pain 12-09-15   Time 8   Period Weeks   Status On-going   PT LONG TERM GOAL  #10   TITLE Pt will be able to perform ADL's independently including putting on socks 12-09-15   Time 8   Period Weeks   Status On-going   PT LONG TERM GOAL  #11   TITLE Pt will be able to sleep on left shoulder for sleep at night 01-07-16   Time 8   Period Weeks   Status New   PT LONG TERM GOAL  #12   TITLE Pt will be able to hold trumper and play without exacerbating shoulder pain   Time 8   Period Weeks   Status New               Plan - 11/12/15 1225    Clinical Impression Statement Pt presents with SPC but proceeds to ambulate without need for cane and slight antalgic gait on left.   Pt with LEFS and 39% limitation from 76.2 % limitation in let hip (THR).  Pt now with 2.82 ft/sec gait velocity which qualifies for community ambulator.  Pt  was instructed to use cane for safety in community and prevent falls and to protect Left arm and humeral fx .  Pt is now beginning resistive exericises.  Pt is very limited with AROM of left  UE  flex 89 and abd 75 , IR  34 and 15 ER with abd at 45 degrees.  Pt also with 10 degrees elbow ext .  Pt and wife are very  willing to perform exericises at home but have been limited until today to begin resistive and  wt bearing.  Pt  very restricted in active ROM of L UE.  Pt would  like to return to playing his trumpet and to carry light groceries  as before injury in january 7 , 2017.  Pt  has accomplished most STG's excerpt for AROM to shoulder level and cannot sleep on left shoulder.  Pt would benefit from 8 more weeks or  skilled PT to maximize use of L UE and increased core strength to maximize use of L UE and L LE.  Pt has achieved most goals for Left THR and is walking with and without a cane on level surfaces and stairs. Pt consented to trigger point dry needling and was educated on aftercare and precautions.  Pt was monitored throughout session and tolerated well. with slight improvement withAROM or L UE.Marland Kitchen Pt is very restricted with scapular mobility and L UE abd and flex and would benefit from  2 x / wee. 8 more weeks to maximize function of left shoulder.     Pt will benefit from skilled therapeutic intervention in order to improve on the following deficits Abnormal gait;Decreased activity tolerance;Decreased balance;Decreased strength;Decreased mobility;Increased edema;Postural dysfunction;Improper body mechanics;Pain;Increased muscle spasms;Decreased range of motion;Difficulty walking;Decreased endurance;Impaired UE functional use;Impaired flexibility;Increased fascial restricitons;Decreased scar mobility   Rehab Potential Good   PT Frequency 2x / week   PT Duration 8 weeks  additional weeks to 01-07-16   PT Treatment/Interventions ADLs/Self Care Home Management;Cryotherapy;Electrical Stimulation;Iontophoresis 4mg /ml Dexamethasone;Moist Heat;Therapeutic exercise;Functional mobility training;Stair training;Gait training;Neuromuscular re-education;Patient/family education;Manual techniques;Passive range of motion;Dry needling;Taping;Ultrasound;Scar mobilization   PT Next Visit Plan give HEP for lateral and forward  step ups.  Begin resisted /isometric and yellow T band for RTC  Please do FOTO for shoulder Left carrying.   PT Home Exercise Plan Step ups forward and lateral and wall clock and walking with cane in community and  without cane around house   Consulted and Agree with Plan of Care Patient;Family member/caregiver          G-Codes - 2015/11/15 Nov 19, 1217    Functional Assessment Tool Used LEFS  39% limitaion   Functional Limitation Mobility: Walking and moving around   Mobility: Walking and Moving Around Goal Status [id number]) At least 20 percent but less than 40 percent impaired, limited or restricted   Mobility: Walking and Moving Around Discharge Status [phone]) At least 20 percent but less than 40 percent impaired, limited or restricted  39%      Problem List Patient Active Problem List   Diagnosis Date Noted  . Spherocytosis (familial) ([place]) 09/17/2015   Voncille Lo, PT 2015-11-15 12:40 PM Phone: [phone] Fax: [phone]  By signing I understand that I am ordering/authorizing the use of Iontophoresis using 4 mg/mL of dexamethasone as a component of this plan of care. [place], [place], [address] Phone: [phone]   Fax:  [phone]  Name: Eric Larsen MRN: [id number] Date of Birth: [birth year]

## 2015-11-14 ENCOUNTER — Ambulatory Visit: Payer: Medicare Other | Admitting: Physical Therapy

## 2015-11-14 DIAGNOSIS — R269 Unspecified abnormalities of gait and mobility: Secondary | ICD-10-CM

## 2015-11-14 DIAGNOSIS — M25512 Pain in left shoulder: Secondary | ICD-10-CM

## 2015-11-14 DIAGNOSIS — R293 Abnormal posture: Secondary | ICD-10-CM

## 2015-11-14 DIAGNOSIS — R6889 Other general symptoms and signs: Secondary | ICD-10-CM

## 2015-11-14 DIAGNOSIS — M25612 Stiffness of left shoulder, not elsewhere classified: Secondary | ICD-10-CM

## 2015-11-14 NOTE — Therapy (Signed)
[place], [place], [address] Phone: [phone]   Fax:  [phone]  Physical Therapy Treatment  Patient Details  Name: Eric Larsen MRN: [id number] Date of Birth: [birth year] Referring Provider: Havery Moros MD  Encounter Date: 11/14/2015      PT End of Session - 11/14/15 1207    Visit Number 18   Number of Visits 41   Date for PT Re-Evaluation 01/07/16   Authorization Type BCBS Medicare  revaluation on 01-07-16   Authorization Time Period 12-09-15 shoulder   PT Start Time 1149   PT Stop Time 1250   PT Time Calculation (min) 61 min      No past medical history on file.  No past surgical history on file.  There were no vitals filed for this visit.  Visit Diagnosis:  Left shoulder pain  Decreased activity tolerance  Abnormality of gait  Abnormal posture  Decreased ROM of left shoulder      Subjective Assessment - 11/14/15 1215    Currently in Pain? No/denies            [facility] PT Assessment - 11/14/15 0001    AROM   Left Shoulder Flexion 78 Degrees   Left Shoulder ABduction 70 Degrees   Left Shoulder Internal Rotation --  reach to t-10   Left Shoulder External Rotation --  base of occiput                     [facility] Adult PT Treatment/Exercise - 11/14/15 0001    Ambulation/Gait   Stairs Yes   Stairs Assistance 6: Modified independent (Device/Increase time)   Stair Management Technique One rail Right;Alternating pattern   Number of Stairs 16   Height of Stairs 6   Knee/Hip Exercises: Standing   Lateral Step Up 15 reps;Hand Hold: 1;Step Height: 6"   Forward Step Up 2 sets;10 reps;Hand Hold: 1;Step Height: 6"   Step Down 2 sets;10 reps;Step Height: 4";Hand Hold: 1   Step Down Limitations cues for knee alignment   Shoulder Exercises: Supine   Other Supine Exercises Supine scap stab series with yellow band, except tan band for ER and diagonals.Marland Kitchen x 15 each   Shoulder Exercises: Seated   Other Seated Exercises seated at tray table with overpressure to hold arm in neutral, sliding pillow case IR to ER x 20- pt able to achieve approx 10 degrees actively in this position.    Cryotherapy   Number Minutes Cryotherapy 10 Minutes   Cryotherapy Location Shoulder   Type of Cryotherapy Ice pack                  PT Short Term Goals - 11/12/15 1038    PT SHORT TERM GOAL #1   Title "Independent with initial HEP for Hip 10-14-15   Time 4   Period Weeks   Status Achieved   PT SHORT TERM GOAL #2   Title "Demonstrate and verbalize understanding of condition management including RICE, positioning, use of A.D., HEP. 10-14-15   Time 4   Period Weeks   Status Achieved   PT SHORT TERM GOAL #3   Title "Demonstrate understanding of proper sitting posture, body mechanics, work ergonomics, and be more conscious of position and posture throughout the day 10-14-15   Baseline wife reminds him    Time 4   Period Weeks   Status Achieved   PT SHORT TERM GOAL #4   Title Pt will decreased pain in left  hip from 5/10 to 2/10 with movement 10-14-15   Baseline no/pain/ denies pain   Time 4   Period Weeks   Status Achieved   PT SHORT TERM GOAL #5   Title Pt will demonstrate 50% greater ease in rising from chair with use of bil UE support 11-11-15   Baseline Pt perfroms 10 x without UE   Time 4   Period Weeks   Status Achieved   PT SHORT TERM GOAL #6   Title Pt will be able to perform initial HEP for left shoulder 11-11-15   Baseline given 10-14-15 and added stretches to now.  Can add resistancew   Time 4   Period Weeks   Status Achieved   PT SHORT TERM GOAL #7   Title Pt will be able to sleep on left side with pain 3/10 or less 11-10-12   Baseline only sleeps on back,  unable to sleep on left shoulder   Time 4   Period Weeks   Status On-going   PT SHORT TERM GOAL #8   Title Pt will be able to lift arm AROM to 90 degrees in order to perform ADL's with  greater ease 11-11-15   Time 4   Period Weeks   Status On-going           PT Long Term Goals - 11/12/15 1042    PT LONG TERM GOAL #1   Title "Pt will be independent with advanced HEP. for left hip and left UE 12-09-15   Time 8   Period Weeks   Status On-going   PT LONG TERM GOAL #2   Title Pt will be able to negotiate stairs with LRAD and no increase in pain in UE or hip left  12-09-15   Baseline no cane and using Left UE for stairrail   Time 8   Period Weeks   Status Achieved   PT LONG TERM GOAL #3   Title "Pt will tolerate standing and walking for 90 minutes  without increased pain in order to return to PLOF  12-09-15   Baseline standing for 1 hour with cane now   Time 8   Period Weeks   Status On-going   PT LONG TERM GOAL #4   Title Pt will be at 1/10 pain or less with all functional activities and ADL's with left hip and Left UE 12-09-15   Baseline 0/10 with hip  but not for UE  5/10   Time 8   Status On-going   PT LONG TERM GOAL #5   Title LEFS will improve to at least 50 % limtation or 49/80 12-09-15   Baseline LEFS at 39%   or 49/80   Time 8   Period Weeks   Status Achieved   PT LONG TERM GOAL #6   Title Gait velocity will improve from 1.17 ft/sec to at least 2.62 ft/sec to show improved mobiility at community level 12-09-15   Baseline 2.82 ft/sec without SPC , no assistive device   Time 8   Period Weeks   Status Achieved   PT LONG TERM GOAL #7   Title left  shoulder AROM scaption will improve to 0-115 degrees for improved overhead reaching. 12-09-15   Baseline Left shoulder flex 89/  abd 75   Time 8   Period Weeks   Status On-going   PT LONG TERM GOAL #8   Title "Tolerate light resistance exercises in flexion and scaption with minimal pain with left UE 12-09-15  Baseline just beginning resistive exericise on 11-12-15   Time 8   Period Weeks   Status On-going   PT LONG TERM GOAL  #9   TITLE Pt will be able to carry light groceries with bil UE without  exacerbating pain 12-09-15   Time 8   Period Weeks   Status On-going   PT LONG TERM GOAL  #10   TITLE Pt will be able to perform ADL's independently including putting on socks 12-09-15   Time 8   Period Weeks   Status On-going   PT LONG TERM GOAL  #11   TITLE Pt will be able to sleep on left shoulder for sleep at night 01-07-16   Time 8   Period Weeks   Status New   PT LONG TERM GOAL  #12   TITLE Pt will be able to hold trumper and play without exacerbating shoulder pain   Time 8   Period Weeks   Status New               Plan - 11/14/15 1325    Clinical Impression Statement Pt demonstrates good safety with reciprocal stairs and one handrail. He and his wife plan to move back upstairs for the first time today. Instructed pt in forward , lateral and retro step ups with cues for knee alignment. Instructed pt in supine scap stab series with lightest band used for ER and diagonals. Also seated ER sliding pillow case on tray table. All added to HEP. Pt reports mild increase pain with theraband exercises.    PT Next Visit Plan give HEP for lateral and forward step ups.  Review HEP given this visit. FOTO for shoulder        Problem List Patient Active Problem List   Diagnosis Date Noted  . Spherocytosis (familial) ([place]) 09/17/2015    Dorene Ar, PTA 11/14/2015, 1:30 PM  [place], [place], [address] Phone: [phone]   Fax:  [phone]  Name: Eric Larsen MRN: [id number] Date of Birth: 09-Oct-1930

## 2015-11-14 NOTE — Patient Instructions (Addendum)
Over Head Pull: Narrow Grip       On back, knees bent, feet flat, band across thighs, elbows straight but relaxed. Pull hands apart (start). Keeping elbows straight, bring arms up and over head, hands toward floor. Keep pull steady on band. Hold momentarily. Return slowly, keeping pull steady, back to start. Repeat _10__ times. Band color __y____   Side Pull: Double Arm   On back, knees bent, feet flat. Arms perpendicular to body, shoulder level, elbows straight but relaxed. Pull arms out to sides, elbows straight. Resistance band comes across collarbones, hands toward floor. Hold momentarily. Slowly return to starting position. Repeat _10__ times. Band color ___y__   Sash   On back, knees bent, feet flat, left hand on left hip, right hand above left. Pull right arm DIAGONALLY (hip to shoulder) across chest. Bring right arm along head toward floor. Hold momentarily. Slowly return to starting position. Repeat __10_ times. Do with left arm. Band color __TAN____   Shoulder Rotation: Double Arm   On back, knees bent, feet flat, elbows tucked at sides, bent 90, hands palms up. Pull hands apart and down toward floor, keeping elbows near sides. Hold momentarily. Slowly return to starting position. Repeat __10_ times. Band color ___TAN___    External Rotation (Assistive)    Seated with elbow bent at right angle and held against side, slide arm on table surface in an outward arc. Repeat __10__ times. Do _2___ sessions per day. Activity: Use this motion to push light object out of the way.or USE pillow case

## 2015-11-19 ENCOUNTER — Ambulatory Visit: Payer: Medicare Other | Attending: Orthopedic Surgery | Admitting: Physical Therapy

## 2015-11-19 DIAGNOSIS — R262 Difficulty in walking, not elsewhere classified: Secondary | ICD-10-CM | POA: Diagnosis present

## 2015-11-19 DIAGNOSIS — R293 Abnormal posture: Secondary | ICD-10-CM | POA: Diagnosis present

## 2015-11-19 DIAGNOSIS — M6281 Muscle weakness (generalized): Secondary | ICD-10-CM | POA: Diagnosis present

## 2015-11-19 DIAGNOSIS — R29898 Other symptoms and signs involving the musculoskeletal system: Secondary | ICD-10-CM | POA: Diagnosis present

## 2015-11-19 DIAGNOSIS — M25612 Stiffness of left shoulder, not elsewhere classified: Secondary | ICD-10-CM | POA: Diagnosis present

## 2015-11-19 DIAGNOSIS — M25552 Pain in left hip: Secondary | ICD-10-CM | POA: Insufficient documentation

## 2015-11-19 DIAGNOSIS — Z96642 Presence of left artificial hip joint: Secondary | ICD-10-CM | POA: Diagnosis present

## 2015-11-19 DIAGNOSIS — M25512 Pain in left shoulder: Secondary | ICD-10-CM | POA: Diagnosis not present

## 2015-11-19 DIAGNOSIS — R2689 Other abnormalities of gait and mobility: Secondary | ICD-10-CM | POA: Diagnosis present

## 2015-11-19 NOTE — Therapy (Signed)
[place], [place], [address] Phone: [phone]   Fax:  [phone]  Physical Therapy Treatment  Patient Details  Name: Eric Larsen MRN: [id number] Date of Birth: [birth year] Referring Provider: Havery Moros MD  Encounter Date: 11/19/2015      PT End of Session - 11/19/15 1230    Visit Number 19   Number of Visits 41   Date for PT Re-Evaluation 01/07/16   Authorization Type BCBS Medicare  revaluation on 01-07-16   Authorization Time Period 12-09-15 shoulder   PT Start Time 1105   PT Stop Time 1203   PT Time Calculation (min) 58 min   Activity Tolerance Patient tolerated treatment well   Behavior During Therapy [facility] for tasks assessed/performed      No past medical history on file.  No past surgical history on file.  There were no vitals filed for this visit.  Visit Diagnosis:  Left shoulder pain  Left hip pain  Other abnormalities of gait and mobility  H/O total hip arthroplasty, left  Abnormal posture  Stiffness of left shoulder, not elsewhere classified  Other symptoms and signs involving the musculoskeletal system  Difficulty in walking, not elsewhere classified      Subjective Assessment - 11/19/15 1320    Subjective I am looking forward to dry needling today.  I really want to move my shoulder better   Pertinent History L THA on 08-25-15  and ORIF of Left shoulder on 08-25-15/ Rehab for Left shoulder to begin 10-14-15  AROM-  no resistance   Limitations Lifting;Sitting;Standing;Walking;House hold activities   Patient Stated Goals I would like to walk without a limp and without an assistive device , be able to get up and down form chair and be able to lift groceries and use my left arm again   Currently in Pain? No/denies   Pain Score 3    Pain Location Hip   Pain Orientation Left   Pain Score 5   Pain Location Shoulder   Pain Orientation Left   Pain Descriptors / Indicators  Aching;Sharp   Pain Type Chronic pain   Pain Onset More than a month ago            [facility] PT Assessment - 11/19/15 1104    Observation/Other Assessments   Focus on Therapeutic Outcomes (FOTO)  Intake 45 %, 55% limitation predicted 39%  for shoulder   PROM   Left Shoulder Flexion 115 Degrees   Left Shoulder ABduction 90 Degrees  post needling   Left Shoulder Internal Rotation 40 Degrees  abd 90   Left Shoulder External Rotation 13 Degrees  abd 90   Left Elbow Extension 3                     [facility] Adult PT Treatment/Exercise - 11/19/15 1130    Shoulder Exercises: Standing   Other Standing Exercises wall clock with left shoulder at 7 , 8 and 9 as able x 10, flexion with pillow case and right hadn assisting flexion x 15    Other Standing Exercises supine left flex and elbow extension with red t band  toward ceiling 10 x 2   Shoulder Exercises: ROM/Strengthening   Other ROM/Strengthening Exercises wife assisting with ER of left hand in supine with shoulder abductted for prolonged stretch with moist heat packs   Modalities   Modalities Moist Heat   Moist Heat Therapy   Number Minutes Moist Heat 15 Minutes  Moist Heat Location Shoulder  left   Manual Therapy   Manual Therapy Soft tissue mobilization;Joint mobilization   Soft tissue mobilization subscapularis, teres major and latissimus on left   Scapular Mobilization inf/ sup glide grade 3 and rotational combination for very tight scapular movement.     Passive ROM PROM of left shoulder in to pt tolerance flexion and ER as well as elbow extension  contract relax for ER          Trigger Point Dry Needling - 11/19/15 1321    Consent Given? Yes   Education Handout Provided No  previously given   Muscles Treated Upper Body Subscapularis;Infraspinatus;Pectoralis major;Pectoralis minor  teres major and lat dorsi left side with twitch   Pectoralis Major Response Twitch response elicited;Palpable increased muscle  length   Pectoralis Minor Response Palpable increased muscle length   Infraspinatus Response Twitch response elicited   Subscapularis Response Twitch response elicited;Palpable increased muscle length  medial and lateral                PT Short Term Goals - 11/12/15 1038    PT SHORT TERM GOAL #1   Title "Independent with initial HEP for Hip 10-14-15   Time 4   Period Weeks   Status Achieved   PT SHORT TERM GOAL #2   Title "Demonstrate and verbalize understanding of condition management including RICE, positioning, use of A.D., HEP. 10-14-15   Time 4   Period Weeks   Status Achieved   PT SHORT TERM GOAL #3   Title "Demonstrate understanding of proper sitting posture, body mechanics, work Personal assistant, and be more conscious of position and posture throughout the day 10-14-15   Baseline wife reminds him    Time 4   Period Weeks   Status Achieved   PT SHORT TERM GOAL #4   Title Pt will decreased pain in left hip from 5/10 to 2/10 with movement 10-14-15   Baseline no/pain/ denies pain   Time 4   Period Weeks   Status Achieved   PT SHORT TERM GOAL #5   Title Pt will demonstrate 50% greater ease in rising from chair with use of bil UE support 11-11-15   Baseline Pt perfroms 10 x without UE   Time 4   Period Weeks   Status Achieved   PT SHORT TERM GOAL #6   Title Pt will be able to perform initial HEP for left shoulder 11-11-15   Baseline given 10-14-15 and added stretches to now.  Can add resistancew   Time 4   Period Weeks   Status Achieved   PT SHORT TERM GOAL #7   Title Pt will be able to sleep on left side with pain 3/10 or less 11-10-12   Baseline only sleeps on back,  unable to sleep on left shoulder   Time 4   Period Weeks   Status On-going   PT SHORT TERM GOAL #8   Title Pt will be able to lift arm AROM to 90 degrees in order to perform ADL's with greater ease 11-11-15   Time 4   Period Weeks   Status On-going           PT Long Term Goals - 11/12/15 1042     PT LONG TERM GOAL #1   Title "Pt will be independent with advanced HEP. for left hip and left UE 12-09-15   Time 8   Period Weeks   Status On-going   PT LONG TERM GOAL #2  Title Pt will be able to negotiate stairs with LRAD and no increase in pain in UE or hip left  12-09-15   Baseline no cane and using Left UE for stairrail   Time 8   Period Weeks   Status Achieved   PT LONG TERM GOAL #3   Title "Pt will tolerate standing and walking for 90 minutes  without increased pain in order to return to PLOF  12-09-15   Baseline standing for 1 hour with cane now   Time 8   Period Weeks   Status On-going   PT LONG TERM GOAL #4   Title Pt will be at 1/10 pain or less with all functional activities and ADL's with left hip and Left UE 12-09-15   Baseline 0/10 with hip  but not for UE  5/10   Time 8   Status On-going   PT LONG TERM GOAL #5   Title LEFS will improve to at least 50 % limtation or 49/80 12-09-15   Baseline LEFS at 39%   or 49/80   Time 8   Period Weeks   Status Achieved   PT LONG TERM GOAL #6   Title Gait velocity will improve from 1.17 ft/sec to at least 2.62 ft/sec to show improved mobiility at community level 12-09-15   Baseline 2.82 ft/sec without SPC , no assistive device   Time 8   Period Weeks   Status Achieved   PT LONG TERM GOAL #7   Title left  shoulder AROM scaption will improve to 0-115 degrees for improved overhead reaching. 12-09-15   Baseline Left shoulder flex 89/  abd 75   Time 8   Period Weeks   Status On-going   PT LONG TERM GOAL #8   Title "Tolerate light resistance exercises in flexion and scaption with minimal pain with left UE 12-09-15   Baseline just beginning resistive exericise on 11-12-15   Time 8   Period Weeks   Status On-going   PT LONG TERM GOAL  #9   TITLE Pt will be able to carry light groceries with bil UE without exacerbating pain 12-09-15   Time 8   Period Weeks   Status On-going   PT LONG TERM GOAL  #10   TITLE Pt will be able to perform  ADL's independently including putting on socks 12-09-15   Time 8   Period Weeks   Status On-going   PT LONG TERM GOAL  #11   TITLE Pt will be able to sleep on left shoulder for sleep at night 01-07-16   Time 8   Period Weeks   Status New   PT LONG TERM GOAL  #12   TITLE Pt will be able to hold trumper and play without exacerbating shoulder pain   Time 8   Period Weeks   Status New               Plan - 11/19/15 1145    Clinical Impression Statement Pt with extreme kyphosis and stiff left shoulder consents to trigger point dry needling and is monitored throughout treatment. Pt tolerates well and wife is present during entire treatment.  Pt shoulder FOTO improved from 77% liimitaton to 55 % today.  Pt will benefit from skilled Pt to address left shoulder pain and left hip pain . and other abnormalities or gait  adn mobiility, H/ o L THR, stiffness of left shoulder. other signs and symptoms of musculoskeletal  system. difficulty walking   Pt will  benefit from skilled therapeutic intervention in order to improve on the following deficits Abnormal gait;Decreased activity tolerance;Decreased balance;Decreased strength;Decreased mobility;Increased edema;Postural dysfunction;Improper body mechanics;Pain;Increased muscle spasms;Decreased range of motion;Difficulty walking;Decreased endurance;Impaired UE functional use;Impaired flexibility;Increased fascial restricitons;Decreased scar mobility   Rehab Potential Good   PT Frequency 2x / week   PT Duration 8 weeks   PT Treatment/Interventions ADLs/Self Care Home Management;Cryotherapy;Electrical Stimulation;Iontophoresis 4mg /ml Dexamethasone;Moist Heat;Therapeutic exercise;Functional mobility training;Stair training;Gait training;Neuromuscular re-education;Patient/family education;Manual techniques;Passive range of motion;Dry needling;Taping;Ultrasound;Scar mobilization   PT Next Visit Plan give HEP for lateral and forward step ups.  Review HEP given  this visit assess ROM of left shoulder pre and post dry  needling   PT Home Exercise Plan Step ups forward and lateral and wall clock and walking with cane in community and  without cane around house          G-Codes - December 11, 2015 1509    Functional Assessment Tool Used FOTO    Functional Limitation Changing and maintaining body position   Carrying, Moving and Handling Objects Current Status [id number]) At least 40 percent but less than 60 percent impaired, limited or restricted  26   Carrying, Moving and Handling Objects Goal Status [id number]) At least 20 percent but less than 40 percent impaired, limited or restricted  39      Problem List Patient Active Problem List   Diagnosis Date Noted  . Spherocytosis (familial) ([place]) 09/17/2015   Voncille Lo, PT 12/11/2015 3:13 PM Phone: [phone] Fax: [facility] [address] [place], [place], [address] Phone: [phone]   Fax:  [phone]  Name: Roshod Capo MRN: [id number] Date of Birth: Dec 23, 1930

## 2015-11-21 ENCOUNTER — Ambulatory Visit: Payer: Medicare Other | Admitting: Physical Therapy

## 2015-11-21 DIAGNOSIS — M25512 Pain in left shoulder: Secondary | ICD-10-CM | POA: Diagnosis not present

## 2015-11-21 DIAGNOSIS — R293 Abnormal posture: Secondary | ICD-10-CM

## 2015-11-21 DIAGNOSIS — M25612 Stiffness of left shoulder, not elsewhere classified: Secondary | ICD-10-CM

## 2015-11-21 DIAGNOSIS — M6281 Muscle weakness (generalized): Secondary | ICD-10-CM

## 2015-11-21 NOTE — Therapy (Signed)
[name], [place], [address] Phone: [phone]   Fax:  [phone]  Physical Therapy Treatment  Patient Details  Name: Eric Larsen MRN: [id number] Date of Birth: 11-11-1930 Referring Provider: Havery Moros MD  Encounter Date: 11/21/2015      PT End of Session - 11/21/15 1212    Visit Number 20   Number of Visits 41   Date for PT Re-Evaluation 01/07/16   Authorization Type BCBS Medicare  revaluation on 01-07-16   Authorization Time Period 12-09-15 shoulder   PT Start Time 1102   PT Stop Time 1200   PT Time Calculation (min) 58 min      No past medical history on file.  No past surgical history on file.  There were no vitals filed for this visit.  Visit Diagnosis:  Stiffness of left shoulder, not elsewhere classified  Abnormal posture  Muscle weakness (generalized)      Subjective Assessment - 11/21/15 1131    Subjective Minor pain.    Currently in Pain? Yes   Pain Score 3    Pain Location Shoulder  and bicep   Pain Orientation Left            [facility] PT Assessment - 11/21/15 1105    AROM   Left Shoulder Flexion 82 Degrees   PROM   Left Shoulder Flexion 120 Degrees   Left Shoulder External Rotation 20 Degrees  A 45 degrees of abduction                     [facility] Adult PT Treatment/Exercise - 11/21/15 0001    Shoulder Exercises: Supine   Other Supine Exercises Skull crusher triceps 1# 2# x 10 each with manual assist to hold humerous in flexion, supine bicep curls 1#, 2# 10 x2   Other Supine Exercises Review of yellow band scao stab including ER at various degrees of abduction, pullowvers with cues to straighten elbow, diagonals with cues to lead with thumb and straighten elbow.    Moist Heat Therapy   Number Minutes Moist Heat 15 Minutes   Moist Heat Location Shoulder   Manual Therapy   Passive ROM PROM of left shoulder in to pt tolerance flexion and ER as well as  elbow extension  contract relax for ER                  PT Short Term Goals - 11/12/15 1038    PT SHORT TERM GOAL #1   Title "Independent with initial HEP for Hip 10-14-15   Time 4   Period Weeks   Status Achieved   PT SHORT TERM GOAL #2   Title "Demonstrate and verbalize understanding of condition management including RICE, positioning, use of A.D., HEP. 10-14-15   Time 4   Period Weeks   Status Achieved   PT SHORT TERM GOAL #3   Title "Demonstrate understanding of proper sitting posture, body mechanics, work ergonomics, and be more conscious of position and posture throughout the day 10-14-15   Baseline wife reminds him    Time 4   Period Weeks   Status Achieved   PT SHORT TERM GOAL #4   Title Pt will decreased pain in left hip from 5/10 to 2/10 with movement 10-14-15   Baseline no/pain/ denies pain   Time 4   Period Weeks   Status Achieved   PT SHORT TERM GOAL #5   Title Pt will demonstrate 50% greater ease in rising  from chair with use of bil UE support 11-11-15   Baseline Pt perfroms 10 x without UE   Time 4   Period Weeks   Status Achieved   PT SHORT TERM GOAL #6   Title Pt will be able to perform initial HEP for left shoulder 11-11-15   Baseline given 10-14-15 and added stretches to now.  Can add resistancew   Time 4   Period Weeks   Status Achieved   PT SHORT TERM GOAL #7   Title Pt will be able to sleep on left side with pain 3/10 or less 11-10-12   Baseline only sleeps on back,  unable to sleep on left shoulder   Time 4   Period Weeks   Status On-going   PT SHORT TERM GOAL #8   Title Pt will be able to lift arm AROM to 90 degrees in order to perform ADL's with greater ease 11-11-15   Time 4   Period Weeks   Status On-going           PT Long Term Goals - 11/12/15 1042    PT LONG TERM GOAL #1   Title "Pt will be independent with advanced HEP. for left hip and left UE 12-09-15   Time 8   Period Weeks   Status On-going   PT LONG TERM GOAL #2    Title Pt will be able to negotiate stairs with LRAD and no increase in pain in UE or hip left  12-09-15   Baseline no cane and using Left UE for stairrail   Time 8   Period Weeks   Status Achieved   PT LONG TERM GOAL #3   Title "Pt will tolerate standing and walking for 90 minutes  without increased pain in order to return to PLOF  12-09-15   Baseline standing for 1 hour with cane now   Time 8   Period Weeks   Status On-going   PT LONG TERM GOAL #4   Title Pt will be at 1/10 pain or less with all functional activities and ADL's with left hip and Left UE 12-09-15   Baseline 0/10 with hip  but not for UE  5/10   Time 8   Status On-going   PT LONG TERM GOAL #5   Title LEFS will improve to at least 50 % limtation or 49/80 12-09-15   Baseline LEFS at 39%   or 49/80   Time 8   Period Weeks   Status Achieved   PT LONG TERM GOAL #6   Title Gait velocity will improve from 1.17 ft/sec to at least 2.62 ft/sec to show improved mobiility at community level 12-09-15   Baseline 2.82 ft/sec without SPC , no assistive device   Time 8   Period Weeks   Status Achieved   PT LONG TERM GOAL #7   Title left  shoulder AROM scaption will improve to 0-115 degrees for improved overhead reaching. 12-09-15   Baseline Left shoulder flex 89/  abd 75   Time 8   Period Weeks   Status On-going   PT LONG TERM GOAL #8   Title "Tolerate light resistance exercises in flexion and scaption with minimal pain with left UE 12-09-15   Baseline just beginning resistive exericise on 11-12-15   Time 8   Period Weeks   Status On-going   PT LONG TERM GOAL  #9   TITLE Pt will be able to carry light groceries with bil UE without exacerbating pain  12-09-15   Time 8   Period Weeks   Status On-going   PT LONG TERM GOAL  #10   TITLE Pt will be able to perform ADL's independently including putting on socks 12-09-15   Time 8   Period Weeks   Status On-going   PT LONG TERM GOAL  #11   TITLE Pt will be able to sleep on left shoulder  for sleep at night 01-07-16   Time 8   Period Weeks   Status New   PT LONG TERM GOAL  #12   TITLE Pt will be able to hold trumper and play without exacerbating shoulder pain   Time 8   Period Weeks   Status New               Plan - 11/21/15 1209    Clinical Impression Statement Pt with improved PROM for flexion and ER. Review of supine exercises and addition or bicep/tricep strengthening today without increased pain. PROM to tolerance.    Pt will benefit from skilled therapeutic intervention in order to improve on the following deficits --  muscle weakness, generalized, stiffness, left shoulder   PT Next Visit Plan Continue manual and dry needling, strength in available ROM for shoulder and elbow left        Problem List Patient Active Problem List   Diagnosis Date Noted  . Spherocytosis (familial) ([place]) 09/17/2015    Dorene Ar, PTA 11/21/2015, 12:16 PM  [place], [place], [address] Phone: [phone]   Fax:  [phone]  Name: Ferlando Farran MRN: [id number] Date of Birth: [birth year]

## 2015-11-25 ENCOUNTER — Ambulatory Visit: Payer: Medicare Other | Admitting: Physical Therapy

## 2015-11-25 DIAGNOSIS — R29898 Other symptoms and signs involving the musculoskeletal system: Secondary | ICD-10-CM

## 2015-11-25 DIAGNOSIS — Z96642 Presence of left artificial hip joint: Secondary | ICD-10-CM

## 2015-11-25 DIAGNOSIS — M25512 Pain in left shoulder: Secondary | ICD-10-CM

## 2015-11-25 DIAGNOSIS — R2689 Other abnormalities of gait and mobility: Secondary | ICD-10-CM

## 2015-11-25 DIAGNOSIS — R293 Abnormal posture: Secondary | ICD-10-CM

## 2015-11-25 DIAGNOSIS — R262 Difficulty in walking, not elsewhere classified: Secondary | ICD-10-CM

## 2015-11-25 DIAGNOSIS — M25612 Stiffness of left shoulder, not elsewhere classified: Secondary | ICD-10-CM

## 2015-11-25 DIAGNOSIS — M6281 Muscle weakness (generalized): Secondary | ICD-10-CM

## 2015-11-25 DIAGNOSIS — M25552 Pain in left hip: Secondary | ICD-10-CM

## 2015-11-25 NOTE — Therapy (Signed)
[place], [place], [address] Phone: [phone]   Fax:  [phone]  Physical Therapy Treatment  Patient Details  Name: Eric Larsen MRN: [id number] Date of Birth: 1931-03-18 Referring Provider: Havery Moros MD  Encounter Date: 11/25/2015      PT End of Session - 11/25/15 1624    Visit Number 21   Number of Visits 41   Date for PT Re-Evaluation 01/07/16   Authorization Type BCBS Medicare  revaluation on 01-07-16   Authorization Time Period 12-09-15 shoulder   PT Start Time 0302   PT Stop Time 0402   PT Time Calculation (min) 60 min   Equipment Utilized During Treatment Gait belt   Activity Tolerance Patient tolerated treatment well   Behavior During Therapy [facility] for tasks assessed/performed      No past medical history on file.  No past surgical history on file.  There were no vitals filed for this visit.      Subjective Assessment - 11/25/15 1621    Subjective Minor pain.             [facility] PT Assessment - 11/25/15 0001    AROM   Left Shoulder Flexion 80 Degrees ; 106  at pulleys                     [facility] Adult PT Treatment/Exercise - 11/25/15 0001    Shoulder Exercises: Seated   Other Seated Exercises Seated ER and various degrees of abduction x 10x 2   Shoulder Exercises: Standing   Flexion AAROM   Flexion Limitations wall slides with pillow case x 10   Other Standing Exercises [place] series yellow band x20 each   Other Standing Exercises cabinet reaching flexion  and abduction x 10 each, ball on wall circles clock wise and counter clockwise, puching into protraction x 10, wash wall horiz ab and addct x 10    Shoulder Exercises: Pulleys   Flexion 2 minutes   Shoulder Exercises: ROM/Strengthening   UBE (Upper Arm Bike) L1 3 min forward, 3 min back   Moist Heat Therapy   Number Minutes Moist Heat 15 Minutes   Moist Heat Location Shoulder                   PT Short Term Goals - 11/25/15 1614    PT SHORT TERM GOAL #1   Title "Independent with initial HEP for Hip 10-14-15   Status Achieved   PT SHORT TERM GOAL #2   Title "Demonstrate and verbalize understanding of condition management including RICE, positioning, use of A.D., HEP. 10-14-15   Status Achieved   PT SHORT TERM GOAL #3   Title "Demonstrate understanding of proper sitting posture, body mechanics, work ergonomics, and be more conscious of position and posture throughout the day 10-14-15   Status Achieved   PT SHORT TERM GOAL #4   Title Pt will decreased pain in left hip from 5/10 to 2/10 with movement 10-14-15   Status Achieved   PT SHORT TERM GOAL #5   Title Pt will demonstrate 50% greater ease in rising from chair with use of bil UE support 11-11-15   Status Achieved   PT SHORT TERM GOAL #6   Title Pt will be able to perform initial HEP for left shoulder 11-11-15   Status Achieved   PT SHORT TERM GOAL #7   Title Pt will be able to sleep on left side with pain 3/10 or less  11-10-12   Status Unable to assess   PT SHORT TERM GOAL #8   Title Pt will be able to lift arm AROM to 90 degrees in order to perform ADL's with greater ease 11-11-15   Status On-going           PT Long Term Goals - 11/25/15 1615    PT LONG TERM GOAL #1   Title "Pt will be independent with advanced HEP. for left hip and left UE 12-09-15   Time 8   Period Weeks   Status On-going   PT LONG TERM GOAL #2   Title Pt will be able to negotiate stairs with LRAD and no increase in pain in UE or hip left  12-09-15   Status Achieved   PT LONG TERM GOAL #3   Title "Pt will tolerate standing and walking for 90 minutes  without increased pain in order to return to PLOF  12-09-15   Time 8   Period Weeks   Status Unable to assess   PT LONG TERM GOAL #4   Title Pt will be at 1/10 pain or less with all functional activities and ADL's with left hip and Left UE 12-09-15   Time 8   Period Weeks   Status Unable to assess    PT LONG TERM GOAL #5   Title LEFS will improve to at least 50 % limtation or 49/80 12-09-15   Time 8   Period Weeks   Status Achieved   PT LONG TERM GOAL #6   Title Gait velocity will improve from 1.17 ft/sec to at least 2.62 ft/sec to show improved mobiility at community level 12-09-15   Baseline 2.82 ft/sec without SPC , no assistive device   Time 8   Status Achieved   PT LONG TERM GOAL #7   Title left  shoulder AROM scaption will improve to 0-115 degrees for improved overhead reaching. 12-09-15   Period Weeks   Status On-going   PT LONG TERM GOAL #8   Title "Tolerate light resistance exercises in flexion and scaption with minimal pain with left UE 12-09-15   Status Achieved   PT LONG TERM GOAL  #9   TITLE Pt will be able to carry light groceries with bil UE without exacerbating pain 12-09-15   Status Unable to assess   PT LONG TERM GOAL  #10   TITLE Pt will be able to perform ADL's independently including putting on socks 12-09-15   Status Unable to assess   PT LONG TERM GOAL  #11   TITLE Pt will be able to sleep on left shoulder for sleep at night 01-07-16   Status Unable to assess   PT LONG TERM GOAL  #12   TITLE Pt will be able to hold trumper and play without exacerbating shoulder pain   Status Unable to assess               Plan - 11/25/15 1605    Clinical Impression Statement Focused strength in available ROM per PT Plan of care. Pt able to begin UBE and standing shoulder flexion activities including cabinet reaching and ball on wall exercises with no increased pan, only fatigue. Pt's AROm for flexion 80 degrees today with AAROM at pulleys 106. Pt able to tolerate light resistance exercises LTG#8 MET. Progressing toward remaining goals.    Rehab Potential Good   PT Frequency 2x / week   PT Duration 8 weeks   PT Treatment/Interventions ADLs/Self Care Home Management;Cryotherapy;Electrical Stimulation;Iontophoresis  64m/ml Dexamethasone;Moist Heat;Therapeutic  exercise;Functional mobility training;Stair training;Gait training;Neuromuscular re-education;Patient/family education;Manual techniques;Passive range of motion;Dry needling;Taping;Ultrasound;Scar mobilization   PT Next Visit Plan Continue manual and dry needling, strength in available ROM for shoulder and elbow left: check all goals   Consulted and Agree with Plan of Care Patient;Family member/caregiver   Family Member Consulted wife      Patient will benefit from skilled therapeutic intervention in order to improve the following deficits and impairments:  Abnormal gait, Decreased activity tolerance, Decreased balance, Decreased range of motion, Decreased strength, Impaired flexibility, Impaired UE functional use, Improper body mechanics, Postural dysfunction, Increased muscle spasms, Difficulty walking, Increased fascial restricitons, Decreased scar mobility  Visit Diagnosis: Stiffness of left shoulder, not elsewhere classified  Abnormal posture  Muscle weakness (generalized)  Left shoulder pain  Left hip pain  Other abnormalities of gait and mobility  H/O total hip arthroplasty, left  Other symptoms and signs involving the musculoskeletal system  Difficulty in walking, not elsewhere classified     Problem List Patient Active Problem List   Diagnosis Date Noted  . Sphe[place]tosis (f[place]ial) ([place] 09/17/2015    DDorene Ar PTA 11/25/2015, 4:27 PM  [facility]GBever[address] [place] [address]Phone: [phone]  Fax:  [phone] Name: WEdker PuntMRN: [id number] of Birth: [birth year]

## 2015-11-26 NOTE — Addendum Note (Signed)
Addended by: Dorothea Ogle on: 11/26/2015 02:44 PM   Modules accepted: Orders

## 2015-11-27 ENCOUNTER — Ambulatory Visit: Payer: Medicare Other | Admitting: Physical Therapy

## 2015-11-27 DIAGNOSIS — M25512 Pain in left shoulder: Secondary | ICD-10-CM

## 2015-11-27 DIAGNOSIS — R293 Abnormal posture: Secondary | ICD-10-CM

## 2015-11-27 DIAGNOSIS — M25612 Stiffness of left shoulder, not elsewhere classified: Secondary | ICD-10-CM

## 2015-11-27 DIAGNOSIS — R2689 Other abnormalities of gait and mobility: Secondary | ICD-10-CM

## 2015-11-27 DIAGNOSIS — M25552 Pain in left hip: Secondary | ICD-10-CM

## 2015-11-27 NOTE — Therapy (Signed)
[place], [place], [address] Phone: [phone]   Fax:  [phone]  Physical Therapy Treatment  Patient Details  Name: Eric Larsen MRN: [id number] Date of Birth: 06-12-1931 Referring Provider: Havery Moros MD  Encounter Date: 11/27/2015      PT End of Session - 11/27/15 0856    Visit Number 22   Number of Visits 41   Date for PT Re-Evaluation 01/07/16   Authorization Type BCBS Medicare  revaluation on 01-07-16   PT Start Time 0149   PT Stop Time 0947   PT Time Calculation (min) 53 min      No past medical history on file.  No past surgical history on file.  There were no vitals filed for this visit.      Subjective Assessment - 11/27/15 0856    Subjective Just sore   Currently in Pain? No/denies            [facility] PT Assessment - 11/27/15 0001    AROM   Left Shoulder Flexion 90 Degrees  scaption                     [facility] Adult PT Treatment/Exercise - 11/27/15 0001    Knee/Hip Exercises: Standing   SLS 10 sec best left x 3 trials    Shoulder Exercises: Standing   Flexion AAROM   Flexion Limitations wall slides with pillow case x 10   Other Standing Exercises [place] series yellow band x20 each   Other Standing Exercises cabinet reaching flexion  x 10 , ball on wall circles clock wise and counter clockwise,   Shoulder Exercises: Pulleys   Flexion 2 minutes   Shoulder Exercises: ROM/Strengthening   UBE (Upper Arm Bike) L1 3 min forward, 3 min back   Moist Heat Therapy   Number Minutes Moist Heat 15 Minutes   Moist Heat Location Shoulder                  PT Short Term Goals - 11/25/15 1614    PT SHORT TERM GOAL #1   Title "Independent with initial HEP for Hip 10-14-15   Status Achieved   PT SHORT TERM GOAL #2   Title "Demonstrate and verbalize understanding of condition management including RICE, positioning, use of A.D., HEP. 10-14-15   Status  Achieved   PT SHORT TERM GOAL #3   Title "Demonstrate understanding of proper sitting posture, body mechanics, work ergonomics, and be more conscious of position and posture throughout the day 10-14-15   Status Achieved   PT SHORT TERM GOAL #4   Title Pt will decreased pain in left hip from 5/10 to 2/10 with movement 10-14-15   Status Achieved   PT SHORT TERM GOAL #5   Title Pt will demonstrate 50% greater ease in rising from chair with use of bil UE support 11-11-15   Status Achieved   PT SHORT TERM GOAL #6   Title Pt will be able to perform initial HEP for left shoulder 11-11-15   Status Achieved   PT SHORT TERM GOAL #7   Title Pt will be able to sleep on left side with pain 3/10 or less 11-10-12   Status Unable to assess   PT SHORT TERM GOAL #8   Title Pt will be able to lift arm AROM to 90 degrees in order to perform ADL's with greater ease 11-11-15   Status On-going  PT Long Term Goals - 11/27/15 0903    PT LONG TERM GOAL #1   Title "Pt will be independent with advanced HEP. for left hip and left UE 12-09-15   Time 8   Period Weeks   Status On-going   PT LONG TERM GOAL #2   Title Pt will be able to negotiate stairs with LRAD and no increase in pain in UE or hip left  12-09-15   Status Achieved   PT LONG TERM GOAL #3   Title "Pt will tolerate standing and walking for 90 minutes  without increased pain in order to return to PLOF  12-09-15   Baseline standing for 1 hour with cane now 3/10   Time 8   Period Weeks   Status On-going   PT LONG TERM GOAL #4   Title Pt will be at 1/10 pain or less with all functional activities and ADL's with left hip and Left UE 12-09-15   Baseline 3/10 hip, 4/10 UE   Time 8   Period Weeks   Status On-going   PT LONG TERM GOAL #5   Title LEFS will improve to at least 50 % limtation or 49/80 12-09-15   Status Achieved   PT LONG TERM GOAL #6   Title Gait velocity will improve from 1.17 ft/sec to at least 2.62 ft/sec to show improved  mobiility at community level 12-09-15   Status Achieved   PT LONG TERM GOAL #7   Title left  shoulder AROM scaption will improve to 0-115 degrees for improved overhead reaching. 12-09-15   Time 8   Period Weeks   Status On-going   PT LONG TERM GOAL #8   Title "Tolerate light resistance exercises in flexion and scaption with minimal pain with left UE 12-09-15   Status Achieved   PT LONG TERM GOAL  #9   TITLE Pt will be able to carry light groceries with bil UE without exacerbating pain 12-09-15   Status Achieved   PT LONG TERM GOAL  #10   TITLE Pt will be able to perform ADL's independently including putting on socks 12-09-15   Baseline needs assistance to dry feet after shower, however donning socks independently   Status Partially Met   PT LONG TERM GOAL  #11   TITLE Pt will be able to sleep on left shoulder for sleep at night 01-07-16   Baseline does not plan to slepp on left side   Time 8   Period Weeks   Status Unable to assess   PT LONG TERM GOAL  #12   TITLE Pt will be able to hold trumper and play without exacerbating shoulder pain   Status Unable to assess               Plan - 11/27/15 0905    Clinical Impression Statement Pt reports he is able to carry light grocerries in bil UE. He does not plan to try lying on left side. He has not tried playing the trumpet. He reports increased fatigue and pain to 3/10 with longer than one hour standing and walking. SLS left 10 sec, right 60 sec. Encouraged pt to continue this for HEP. Pt's Scaption AROM measures 90 today and he demonstrates improved tolerance to shoulder height wall endurance activites for the left shoulder as well as cabinet reaching. He reports increased pain to 4/10 with reaching exercises which subsides after he completes the exercises. LTG# 9 Met, #10 partially Met.    PT Next Visit  Plan Continue manual and dry needling, strength in available ROM for shoulder and elbow left: check all goals      Patient will  benefit from skilled therapeutic intervention in order to improve the following deficits and impairments:     Visit Diagnosis: Stiffness of left shoulder, not elsewhere classified  Abnormal posture  Left shoulder pain  Left hip pain  Other abnormalities of gait and mobility     Problem List Patient Active Problem List   Diagnosis Date Noted  . Spherocytosis (familial) ([place]) 09/17/2015    Dorene Ar, PTA 11/27/2015, 9:46 AM  [facility] [address] [place], [place], [address] Phone: [phone]   Fax:  [phone]  Name: Eric Larsen MRN: [id number] Date of Birth: 02/05/1931

## 2015-12-03 ENCOUNTER — Encounter: Payer: Medicare Other | Admitting: Physical Therapy

## 2015-12-05 ENCOUNTER — Ambulatory Visit: Payer: Medicare Other | Admitting: Physical Therapy

## 2015-12-05 DIAGNOSIS — M25612 Stiffness of left shoulder, not elsewhere classified: Secondary | ICD-10-CM

## 2015-12-05 DIAGNOSIS — R293 Abnormal posture: Secondary | ICD-10-CM

## 2015-12-05 DIAGNOSIS — M25512 Pain in left shoulder: Secondary | ICD-10-CM

## 2015-12-05 DIAGNOSIS — M6281 Muscle weakness (generalized): Secondary | ICD-10-CM

## 2015-12-05 NOTE — Therapy (Signed)
[place], [place], [address] Phone: [phone]   Fax:  [phone]  Physical Therapy Treatment  Patient Details  Name: Eric Larsen MRN: [id number] Date of Birth: September 22, 1930 Referring Provider: Havery Moros MD  Encounter Date: 12/05/2015      PT End of Session - 12/05/15 1154    Visit Number 23   Number of Visits 41   Date for PT Re-Evaluation 01/07/16   Authorization Type BCBS Medicare  revaluation on 01-07-16   Authorization Time Period 12-09-15 shoulder   PT Start Time 1150   PT Stop Time 1245   PT Time Calculation (min) 55 min   Activity Tolerance Patient tolerated treatment well   Behavior During Therapy [facility] for tasks assessed/performed      No past medical history on file.  No past surgical history on file.  There were no vitals filed for this visit.      Subjective Assessment - 12/05/15 1155    Subjective No pain but a little sore   Pertinent History L THA on 08-25-15  and ORIF of Left shoulder on 08-25-15/ Rehab for Left shoulder to begin 10-14-15  AROM-  no resistance   Limitations Lifting;Sitting;Standing;Walking;House hold activities   How long can you sit comfortably? 2 hour   Patient Stated Goals I would like to walk without a limp and without an assistive device , be able to get up and down form chair and be able to lift groceries and use my left arm again   Currently in Pain? No/denies  just stiff             [facility] PT Assessment - 12/05/15 1202    AROM   Left Shoulder Flexion 90 Degrees  flex  AAROM 90 to begin  98 post RX                     [facility] Adult PT Treatment/Exercise - 12/05/15 1148    Shoulder Exercises: Supine   Other Supine Exercises soft foam roller with shoulder book opening and PNF diagonals with assist from PT for thoracic extension with PT hold head for support due to extreme forward head and kyphosis x 10 each   Shoulder Exercises: Standing    Other Standing Exercises red tband scaption with tband held by left foot and arm elbow extension and scaption x 10     Shoulder Exercises: ROM/Strengthening   UBE (Upper Arm Bike) L1 3 min forward, 3 min back total 6 minutes   Moist Heat Therapy   Number Minutes Moist Heat 15 Minutes   Moist Heat Location Shoulder   Manual Therapy   Joint Mobilization elbow AP glide with external rotation osscillations.left shoudler [place] IR/ER at abd 90 grade 4    Soft tissue mobilization STW to teres major, lats and subscapularis   Myofascial Release subscapularis with shoulder flex   Scapular Mobilization inferior glide with rotation grade 3   Pt with increased mobility from evaluation,    Passive ROM PROM of left shoulder in to pt tolerance flexion and ER as well as elbow extension  contract relax for ER          Trigger Point Dry Needling - 12/05/15 1208    Consent Given? Yes   Education Handout Provided No  previously given   Muscles Treated Upper Body Subscapularis;Infraspinatus;Supraspinatus;Rhomboids  lats all TDN on left side only   Supraspinatus Response Twitch response elicited;Palpable increased muscle length   Infraspinatus Response  Twitch response elicited;Palpable increased muscle length   Subscapularis Response Twitch response elicited;Palpable increased muscle length  med and lateral              PT Education - 12/05/15 1323    Education provided Yes   Education Details Pt educated on plan for healling and maximizing strength.  Need for use of HEP to use independently.  Given Red t band for scaption exericises with retrun demo   Person(s) Educated Patient;Spouse   Methods Explanation;Demonstration;Verbal cues;Tactile cues   Comprehension Verbalized understanding;Returned demonstration          PT Short Term Goals - 12/05/15 1307    PT SHORT TERM GOAL #1   Title "Independent with initial HEP for Hip 10-14-15   Time 4   Period Weeks   Status Achieved   PT  SHORT TERM GOAL #2   Title "Demonstrate and verbalize understanding of condition management including RICE, positioning, use of A.D., HEP. 10-14-15   Time 4   Period Weeks   Status Achieved   PT SHORT TERM GOAL #3   Title "Demonstrate understanding of proper sitting posture, body mechanics, work ergonomics, and be more conscious of position and posture throughout the day 10-14-15   Time 4   Period Weeks   Status Achieved   PT SHORT TERM GOAL #4   Title Pt will decreased pain in left hip from 5/10 to 2/10 with movement 10-14-15   Baseline no/pain/ denies pain   Time 4   Period Weeks   Status Achieved   PT SHORT TERM GOAL #5   Title Pt will demonstrate 50% greater ease in rising from chair with use of bil UE support 11-11-15   Baseline Pt perfroms 10 x without UE   Time 4   Period Weeks   Status Achieved   PT SHORT TERM GOAL #6   Title Pt will be able to perform initial HEP for left shoulder 11-11-15   Time 4   Period Weeks   Status Achieved   PT SHORT TERM GOAL #7   Title Pt will be able to sleep on left side with pain 3/10 or less 11-10-12   Baseline prefers to sleep on back  and able to sleep on left side   Time 4   Period Weeks   Status Achieved   PT SHORT TERM GOAL #8   Title Pt will be able to lift arm AROM to 90 degrees in order to perform ADL's with greater ease 11-11-15   Baseline Pt AROM 80 and after RX is 90 degrees    Time 4   Period Weeks   Status On-going           PT Long Term Goals - 12/05/15 1310    PT LONG TERM GOAL #1   Title "Pt will be independent with advanced HEP. for left hip and left UE 12-09-15   Time 8   Period Weeks   Status On-going   PT LONG TERM GOAL #2   Title Pt will be able to negotiate stairs with LRAD and no increase in pain in UE or hip left  12-09-15   Baseline no cane and using Left UE for stairrail   Time 8   Period Weeks   Status Achieved   PT LONG TERM GOAL #3   Title "Pt will tolerate standing and walking for 90 minutes   without increased pain in order to return to PLOF  12-09-15   Baseline Pt able to  stand    Time 8   Period Weeks   PT LONG TERM GOAL #4   Title Pt will be at 1/10 pain or less with all functional activities and ADL's with left hip and Left UE 12-09-15               Plan - 12/05/15 1325    Clinical Impression Statement Pt is able to lie on left shoudler but he prefers lying on his back.  He has initially 80 degrees active scaption and AAROM 90. AFter treatment he gains 90 AROM and 98 AAROM.  Pt/wife explained that he has 3 more trigger point dry needling appt and  we will be working toward a HEP that he can progress as he is able .  Eric Larsen is limited by left shoudler ROM but mostly strength.   Will work toward completing goals and preparing for discharge with HEP   Rehab Potential Good   PT Frequency 2x / week   PT Duration 8 weeks   PT Treatment/Interventions ADLs/Self Care Home Management;Cryotherapy;Electrical Stimulation;Iontophoresis 4mg /ml Dexamethasone;Moist Heat;Therapeutic exercise;Functional mobility training;Stair training;Gait training;Neuromuscular re-education;Patient/family education;Manual techniques;Passive range of motion;Dry needling;Taping;Ultrasound;Scar mobilization   PT Next Visit Plan Continue manual and dry needling, strength in available ROM for shoulder and elbow left: check all goals, check goals and and prepare for HEP finalization with progression assess balance and give final HEP for LE standing SLR/balance and DC all other LE exericises/    PT Home Exercise Plan Continue with scaption with red t band exericise and wall /clock exericise to maximize range   Consulted and Agree with Plan of Care Patient;Family member/caregiver   Family Member Consulted wife      Patient will benefit from skilled therapeutic intervention in order to improve the following deficits and impairments:  Abnormal gait, Decreased activity tolerance, Decreased balance, Decreased  range of motion, Decreased strength, Impaired flexibility, Impaired UE functional use, Improper body mechanics, Postural dysfunction, Increased muscle spasms, Difficulty walking, Increased fascial restricitons, Decreased scar mobility  Visit Diagnosis: Stiffness of left shoulder, not elsewhere classified  Abnormal posture  Pain in left shoulder  Muscle weakness (generalized)     Problem List Patient Active Problem List   Diagnosis Date Noted  . Spherocytosis (familial) ([place]) 09/17/2015   Voncille Lo, PT 12/05/2015 1:33 PM Phone: [phone] Fax: [place], [place], [address] Phone: [phone]   Fax:  [phone]  Name: Eric Larsen MRN: [id number] Date of Birth: [birth year]

## 2015-12-06 ENCOUNTER — Ambulatory Visit: Payer: Medicare Other | Admitting: Physical Therapy

## 2015-12-06 DIAGNOSIS — R293 Abnormal posture: Secondary | ICD-10-CM

## 2015-12-06 DIAGNOSIS — M25612 Stiffness of left shoulder, not elsewhere classified: Secondary | ICD-10-CM

## 2015-12-06 DIAGNOSIS — M6281 Muscle weakness (generalized): Secondary | ICD-10-CM

## 2015-12-06 DIAGNOSIS — M25512 Pain in left shoulder: Secondary | ICD-10-CM

## 2015-12-06 NOTE — Therapy (Signed)
[place], [place], [address] Phone: [phone]   Fax:  [phone]  Physical Therapy Treatment  Patient Details  Name: Eric Larsen MRN: [id number] Date of Birth: July 07, 1931 Referring Provider: Havery Moros MD  Encounter Date: 12/06/2015      PT End of Session - 12/06/15 0908    Visit Number 24   Number of Visits 41   Date for PT Re-Evaluation 01/07/16   Authorization Type BCBS Medicare  revaluation on 01-07-16   PT Start Time 0905  20 minutes late   PT Stop Time 0953   PT Time Calculation (min) 48 min      No past medical history on file.  No past surgical history on file.  There were no vitals filed for this visit.      Subjective Assessment - 12/06/15 0908    Subjective No pain but sore.    Currently in Pain? No/denies            [facility] PT Assessment - 12/06/15 0001    AROM   Left Shoulder Flexion 86 Degrees  AAROM 105 with pulleys                     [facility] Adult PT Treatment/Exercise - 12/06/15 0001    Shoulder Exercises: Standing   Horizontal ABduction 20 reps;Theraband   Theraband Level (Shoulder Horizontal ABduction) Level 1 (Yellow)   Other Standing Exercises 1# flexion/scaption, abduction x 10 each, elbow flexion 2 # x 15, tricep press yellow band 10 x 2-will use red at home    Other Standing Exercises cabinet reaching botton of middle shelf AROM, the 1# x 10 each   Shoulder Exercises: Pulleys   Flexion 2 minutes   Shoulder Exercises: ROM/Strengthening   UBE (Upper Arm Bike) L2.5 2.5  minutes forward and backward   Moist Heat Therapy   Number Minutes Moist Heat 15 Minutes   Moist Heat Location Shoulder   Manual Therapy   Passive ROM PROM of left shoulder in to pt tolerance flexion and ER as well as elbow extension  contract relax for ER              PT Education - 12/06/15 0946    Education provided Yes   Education Details standing horizontal  abduction, tricep press with red band   Person(s) Educated Patient   Methods Explanation;Handout   Comprehension Verbalized understanding          PT Short Term Goals - 12/05/15 1307    PT SHORT TERM GOAL #1   Title "Independent with initial HEP for Hip 10-14-15   Time 4   Period Weeks   Status Achieved   PT SHORT TERM GOAL #2   Title "Demonstrate and verbalize understanding of condition management including RICE, positioning, use of A.D., HEP. 10-14-15   Time 4   Period Weeks   Status Achieved   PT SHORT TERM GOAL #3   Title "Demonstrate understanding of proper sitting posture, body mechanics, work ergonomics, and be more conscious of position and posture throughout the day 10-14-15   Time 4   Period Weeks   Status Achieved   PT SHORT TERM GOAL #4   Title Pt will decreased pain in left hip from 5/10 to 2/10 with movement 10-14-15   Baseline no/pain/ denies pain   Time 4   Period Weeks   Status Achieved   PT SHORT TERM GOAL #5   Title Pt will demonstrate  50% greater ease in rising from chair with use of bil UE support 11-11-15   Baseline Pt perfroms 10 x without UE   Time 4   Period Weeks   Status Achieved   PT SHORT TERM GOAL #6   Title Pt will be able to perform initial HEP for left shoulder 11-11-15   Time 4   Period Weeks   Status Achieved   PT SHORT TERM GOAL #7   Title Pt will be able to sleep on left side with pain 3/10 or less 11-10-12   Baseline prefers to sleep on back  and able to sleep on left side   Time 4   Period Weeks   Status Achieved   PT SHORT TERM GOAL #8   Title Pt will be able to lift arm AROM to 90 degrees in order to perform ADL's with greater ease 11-11-15   Baseline Pt AROM 80 and after RX is 90 degrees    Time 4   Period Weeks   Status On-going           PT Long Term Goals - 12/05/15 1310    PT LONG TERM GOAL #1   Title "Pt will be independent with advanced HEP. for left hip and left UE 12-09-15   Time 8   Period Weeks   Status  On-going   PT LONG TERM GOAL #2   Title Pt will be able to negotiate stairs with LRAD and no increase in pain in UE or hip left  12-09-15   Baseline no cane and using Left UE for stairrail   Time 8   Period Weeks   Status Achieved   PT LONG TERM GOAL #3   Title "Pt will tolerate standing and walking for 90 minutes  without increased pain in order to return to PLOF  12-09-15   Baseline Pt able to stand    Time 8   Period Weeks   PT LONG TERM GOAL #4   Title Pt will be at 1/10 pain or less with all functional activities and ADL's with left hip and Left UE 12-09-15               Plan - 12/06/15 0950    Clinical Impression Statement Pt 20 minutes late today. Focuesd shoulder stretch and strengthening in remaining time. Pt demonstrates improved AROM, AAROM following TPDN yesterday. He reports soreness superior shoulder. Performed AAROM, AROM and PROM Flexion, scaption, abduction, ER to tolerance. Also instructed in standing  horizontal abduction with band as well as tricep press with band and updated HEP. Pt reports no increased pain. HMP at end of session for soreness.    PT Next Visit Plan Pt to bring HEP; Continue manual and dry needling, strength in available ROM for shoulder and elbow left: check all goals, check goals and and prepare for HEP finalization with progression assess balance and give final HEP for LE standing SLR/balance and DC all other LE exericises/       Patient will benefit from skilled therapeutic intervention in order to improve the following deficits and impairments:  Abnormal gait, Decreased activity tolerance, Decreased balance, Decreased range of motion, Decreased strength, Impaired flexibility, Impaired UE functional use, Improper body mechanics, Postural dysfunction, Increased muscle spasms, Difficulty walking, Increased fascial restricitons, Decreased scar mobility  Visit Diagnosis: Stiffness of left shoulder, not elsewhere classified  Abnormal  posture  Pain in left shoulder  Muscle weakness (generalized)     Problem List Patient Active Problem  List   Diagnosis Date Noted  . Spherocytosis (familial) ([place]) 09/17/2015    Dorene Ar, PTA 12/06/2015, 10:04 AM  [place], [place], [address] Phone: [phone]   Fax:  [phone]  Name: Delvante Hilling MRN: [id number] Date of Birth: February 04, 1931

## 2015-12-10 ENCOUNTER — Ambulatory Visit: Payer: Medicare Other | Admitting: Physical Therapy

## 2015-12-10 DIAGNOSIS — R29898 Other symptoms and signs involving the musculoskeletal system: Secondary | ICD-10-CM

## 2015-12-10 DIAGNOSIS — M25612 Stiffness of left shoulder, not elsewhere classified: Secondary | ICD-10-CM

## 2015-12-10 DIAGNOSIS — M25512 Pain in left shoulder: Secondary | ICD-10-CM

## 2015-12-10 DIAGNOSIS — R262 Difficulty in walking, not elsewhere classified: Secondary | ICD-10-CM

## 2015-12-10 DIAGNOSIS — R293 Abnormal posture: Secondary | ICD-10-CM

## 2015-12-10 DIAGNOSIS — M25552 Pain in left hip: Secondary | ICD-10-CM

## 2015-12-10 DIAGNOSIS — R2689 Other abnormalities of gait and mobility: Secondary | ICD-10-CM

## 2015-12-10 DIAGNOSIS — M6281 Muscle weakness (generalized): Secondary | ICD-10-CM

## 2015-12-10 NOTE — Therapy (Signed)
[place], [place], [address] Phone: [phone]   Fax:  [phone]  Physical Therapy Treatment  Patient Details  Name: Eric Larsen MRN: [id number] Date of Birth: 12/14/1930 Referring Provider: Havery Moros MD  Encounter Date: 12/10/2015      PT End of Session - 12/10/15 1033    Visit Number 25   Number of Visits 41   Date for PT Re-Evaluation 01/07/16   Authorization Type [employer]BS Medicare  revaluation on 01-07-16   Authorization Time Period 12-09-15 shoulder   PT Start Time 1035   PT Stop Time 1140   PT Time Calculation (min) 65 min   Activity Tolerance Patient tolerated treatment well   Behavior During Therapy [facility] for tasks assessed/performed      No past medical history on file.  No past surgical history on file.  There were no vitals filed for this visit.      Subjective Assessment - 12/10/15 1155    Subjective no pain but sore   Pertinent History L THA on 08-25-15  and ORIF of Left shoulder on 08-25-15/ Rehab for Left shoulder to begin 10-14-15  AROM-  no resistance   How long can you sit comfortably? 2 hour   How long can you stand comfortably? 1 hour   How long can you walk comfortably?  1 hour at [employer] and using cart   Diagnostic tests x ray, MRI   Patient Stated Goals I would like to walk without a limp and without an assistive device , be able to get up and down form chair and be able to lift groceries and use my left arm again   Currently in Pain? No/denies   Pain Location Shoulder   Pain Orientation Left   Pain Descriptors / Indicators Tightness;Sore   Pain Type Chronic pain   Pain Onset More than a month ago   Pain Frequency Occasional   Pain Score 0   Pain Location Hip   Pain Orientation Left            [facility] PT Assessment - 12/10/15 1038    AROM   Right Shoulder Extension 40 Degrees   Right Shoulder Flexion 128 Degrees   Right Shoulder ABduction 120 Degrees   Right  Shoulder Internal Rotation 58 Degrees   Right Shoulder External Rotation 60 Degrees   Right Shoulder Horizontal ABduction 88 Degrees   Right Shoulder Horizontal  ADduction 130 Degrees   Left Shoulder Extension 40 Degrees   Left Shoulder Flexion 89 Degrees  AAROM 105 on wall   Left Shoulder ABduction 70 Degrees   Left Shoulder Internal Rotation 30 Degrees   wigth abd at 90degrees   Left Shoulder External Rotation 30 Degrees  ERP post dry needling abd at 90   Right Elbow Extension 0   Left Elbow Extension 6   Right Hip Flexion 110   Right Hip External Rotation  35   Right Hip Internal Rotation  25   Left Hip Flexion 100   Left Hip External Rotation  25   Strength   Overall Strength Deficits   Left Shoulder Flexion 3-/5   Left Shoulder Extension 3-/5                     [facility] Adult PT Treatment/Exercise - 12/10/15 1034    Shoulder Exercises: Standing   Horizontal ABduction --   Theraband Level (Shoulder Horizontal ABduction) --   Other Standing Exercises standing at wall ,  wall slides x 15   also ER stretch with towel in armpit and ER assisted at doorway 30 seconds hold. Pt to side step to right and maintain position   Other Standing Exercises --   Shoulder Exercises: Pulleys   Flexion --   Other Pulley Exercises --   Shoulder Exercises: ROM/Strengthening   UBE (Upper Arm Bike) --   Moist Heat Therapy   Moist Heat Location --   Ultrasound   Ultrasound Location left elbow biceps,  teres major/lats on left   Ultrasound Parameters 1.6 w / cm2 100%   Ultrasound Goals Other (Comment)  tissue extensibillty   Manual Therapy   Joint Mobilization elbow AP glide with external rotation osscillations.left shoudler [place] IR/ER at abd 90 grade 4    Soft tissue mobilization STW to teres major, lats and subscapularis   Myofascial Release subscapularis with shoulder flex   Scapular Mobilization inferior glide with rotation grade 3    Passive ROM PROM of left shoulder in to  pt tolerance flexion and ER as well as elbow extension and also shoulder scouring x 10 in each direction  contract relax for ER    AROM with red t band 10 x in flexion with foot on t band and in abduction 10 times.      Trigger Point Dry Needling - 12/10/15 1041    Consent Given? Yes   Education Handout Provided No   Muscles Treated Upper Body Rhomboids;Pectoralis major;Upper trapezius;Pectoralis minor;Levator scapulae;Supraspinatus;Infraspinatus;Subscapularis  previously given  left side only TDN   Pectoralis Major Response Twitch response elicited;Palpable increased muscle length   Rhomboids Response Palpable increased muscle length   Supraspinatus Response Palpable increased muscle length   Infraspinatus Response Twitch response elicited;Palpable increased muscle length   Subscapularis Response Twitch response elicited;Palpable increased muscle length              PT Education - 12/10/15 1212    Education provided Yes   Education Details educated on heterotopic ossification and need for increasing AROM but not to pain.  Reviewed ER stretches in shoulder abd and at side in 0 degrees using doorway   Person(s) Educated Patient   Methods Explanation;Demonstration;Verbal cues   Comprehension Verbalized understanding;Returned demonstration          PT Short Term Goals - 12/10/15 1153    PT SHORT TERM GOAL #1   Title "Independent with initial HEP for Hip 10-14-15   Time 4   Period Weeks   Status Achieved   PT SHORT TERM GOAL #2   Title "Demonstrate and verbalize understanding of condition management including RICE, positioning, use of A.D., HEP. 10-14-15   Time 4   Period Weeks   Status Achieved   PT SHORT TERM GOAL #3   Title "Demonstrate understanding of proper sitting posture, body mechanics, work ergonomics, and be more conscious of position and posture throughout the day 10-14-15   Baseline wife reminds him    Time 4   Period Weeks   Status Achieved   PT SHORT TERM  GOAL #4   Title Pt will decreased pain in left hip from 5/10 to 2/10 with movement 10-14-15   Baseline no/pain/ denies pain   Time 4   Period Weeks   Status Achieved   PT SHORT TERM GOAL #5   Title Pt will demonstrate 50% greater ease in rising from chair with use of bil UE support 11-11-15   Baseline Pt perfroms 10 x without UE   Time 4  Period Weeks   Status Achieved   PT SHORT TERM GOAL #6   Title Pt will be able to perform initial HEP for left shoulder 11-11-15   Baseline updating UE HEP for home use in next 3 visit   Time 4   Period Weeks   Status Achieved   PT SHORT TERM GOAL #7   Title Pt will be able to sleep on left side with pain 3/10 or less 11-10-12   Baseline Pt sleeps on right side but prefers back and does not continue to sleep on left side but can lie on it a short time   Time 4   Period Weeks   Status Achieved   PT SHORT TERM GOAL #8   Title Pt will be able to lift arm AROM to 90 degrees in order to perform ADL's with greater ease 11-11-15   Baseline 89 degrees today A/ROM and AAROM to 105   Time 4   Period Weeks   Status Partially Met           PT Long Term Goals - 12/10/15 1155    PT LONG TERM GOAL #1   Title "Pt will be independent with advanced HEP. for left hip and left UE 12-09-15   Baseline finalizing HEP in next 4 visits   Time 8   Period Weeks   Status On-going   PT LONG TERM GOAL #2   Title Pt will be able to negotiate stairs with LRAD and no increase in pain in UE or hip left  12-09-15   Baseline no cane and using Left UE for stairrail   Time 8   Period Weeks   Status Achieved   PT LONG TERM GOAL #3   Title "Pt will tolerate standing and walking for 90 minutes  without increased pain in order to return to PLOF  12-09-15   Baseline Pt able to stand for 1 hour   Time 8   Period Weeks   Status On-going   PT LONG TERM GOAL #4   Title Pt will be at 1/10 pain or less with all functional activities and ADL's with left hip and Left UE 12-09-15    Baseline pain only with PT mobilizing arm 0/10 the rest of time   Time 8   Period Weeks   Status Achieved   PT LONG TERM GOAL #6   Title Gait velocity will improve from 1.17 ft/sec to at least 2.62 ft/sec to show improved mobiility at community level 12-09-15   Baseline 2.82 ft/sec without SPC , no assistive device   Time 8   Period Weeks   Status Achieved   PT LONG TERM GOAL #7   Title left  shoulder AROM scaption will improve to 0-115 degrees for improved overhead reaching. 12-09-15   Baseline AROM 95 after treatment scaption AAROM 105   Time 8   Period Weeks   Status On-going   PT LONG TERM GOAL #8   Title "Tolerate light resistance exercises in flexion and scaption with minimal pain with left UE 12-09-15   Baseline using red resistive band for exercise.  3-/5 flexion/abd of left UE   Time 8   Period Weeks   Status Achieved   PT LONG TERM GOAL  #9   TITLE Pt will be able to carry light groceries with bil UE without exacerbating pain 12-09-15   Time 8   Period Weeks   Status Achieved   PT LONG TERM GOAL  #10  TITLE Pt will be able to perform ADL's independently including putting on socks 12-09-15   Baseline needs assistance to dry feet after shower, however donning socks independently   Time 8   Period Weeks   Status Partially Met   PT LONG TERM GOAL  #11   TITLE Pt will be able to sleep on left shoulder for sleep at night 01-07-16   Baseline Pt prefers to sleep on right and back. Pt can lie for a short time on left but cannot sleep   Time 8   Period Weeks   Status On-going   PT LONG TERM GOAL  #12   TITLE Pt will be able to hold trumper and play without exacerbating shoulder pain   Baseline has not tried at home   Time 8   Period Weeks   Status On-going               Plan - 12/10/15 1200    Clinical Impression Statement Pt has not tried to play trumpet at home. Pt shoulder 89 flex AROM initially but after treatment had 95 AROM and AAROM 105.  Pt tolerates Trigger  point dry needling well and is monitored throughout treatment.  Tissue Extensibility improved  with TDN and AROM improved.  Pt is more limited by strengthe which is 3-/5 in Left shoulder and flexion.  Pt and wife understand that  they will need to continue strengthening and maintain availabel ROM.  Pt  understands that left shoulder may have more limted AROM than his Right which  has flexion on R UE 123.  Pt will continue for 4 additional treatments and then continue HEP.. Pt declined heat due to another appt. Pt limited by AROM and strength.   Rehab Potential Good   PT Frequency 2x / week   PT Duration 8 weeks   PT Treatment/Interventions ADLs/Self Care Home Management;Cryotherapy;Electrical Stimulation;Iontophoresis 71m/ml Dexamethasone;Moist Heat;Therapeutic exercise;Functional mobility training;Stair training;Gait training;Neuromuscular re-education;Patient/family education;Manual techniques;Passive range of motion;Dry needling;Taping;Ultrasound;Scar mobilization   PT Next Visit Plan Pt to bring HEP; Continue manual and dry needling, strength in available ROM for shoulder and elbow left: prepare for HEP finalization with progression assess balance and give final HEP for LE standing SLR/balance and DC all other LE exericises/    PT Home Exercise Plan Continue with scaption with red t band exericise and wall /clock exericise to maximize range and doorway external rotation stretch      Patient will benefit from skilled therapeutic intervention in order to improve the following deficits and impairments:  Abnormal gait, Decreased activity tolerance, Decreased balance, Decreased range of motion, Decreased strength, Impaired flexibility, Impaired UE functional use, Improper body mechanics, Postural dysfunction, Increased muscle spasms, Difficulty walking, Increased fascial restricitons, Decreased scar mobility  Visit Diagnosis: Stiffness of left shoulder, not elsewhere classified  Abnormal  posture  Pain in left shoulder  Muscle weakness (generalized)  Left shoulder pain  Left hip pain  Other abnormalities of gait and mobility  Other symptoms and signs involving the musculoskeletal system  Difficulty in walking, not elsewhere classified     Problem List Patient Active Problem List   Diagnosis Date Noted  . Spherocytosis (familial) ([place] 09/17/2015    LVoncille Lo PT 12/10/2015 12:17 PM Phone: [phone]: 3[facility]GBa[address] [place] [address]Phone: [phone]  Fax:  [phone] Name: Eric KistlerMRN: [id number] of Birth: 4January 10, 1932

## 2015-12-12 ENCOUNTER — Ambulatory Visit: Payer: Medicare Other | Admitting: Physical Therapy

## 2015-12-12 DIAGNOSIS — M25512 Pain in left shoulder: Secondary | ICD-10-CM | POA: Diagnosis not present

## 2015-12-12 DIAGNOSIS — M25612 Stiffness of left shoulder, not elsewhere classified: Secondary | ICD-10-CM

## 2015-12-12 DIAGNOSIS — R293 Abnormal posture: Secondary | ICD-10-CM

## 2015-12-12 DIAGNOSIS — M25552 Pain in left hip: Secondary | ICD-10-CM

## 2015-12-12 DIAGNOSIS — M6281 Muscle weakness (generalized): Secondary | ICD-10-CM

## 2015-12-13 NOTE — Therapy (Signed)
[place], [place], [address] Phone: [phone]   Fax:  [phone]  Physical Therapy Treatment  Patient Details  Name: Eric Larsen MRN: [id number] Date of Birth: [birth year] Referring Provider: Havery Moros MD  Encounter Date: 12/12/2015      PT End of Session - 12/13/15 0822    Visit Number 26   Number of Visits 41   Date for PT Re-Evaluation 01/07/16   Authorization Type BCBS Medicare  revaluation on 01-07-16   PT Start Time 1155  10 minutes   PT Stop Time 1230   PT Time Calculation (min) 35 min      No past medical history on file.  No past surgical history on file.  There were no vitals filed for this visit.      Subjective Assessment - 12/13/15 0848    Currently in Pain? No/denies            [facility] PT Assessment - 12/13/15 0001    AROM   Left Shoulder Flexion 90 Degrees                     [facility] Adult PT Treatment/Exercise - 12/13/15 0001    Knee/Hip Exercises: Standing   SLS with Vectors 10 x 2 each way with 1 finger support bilateral- cues for trunk and hip extension   Shoulder Exercises: Standing   Extension Strengthening;Both;15 reps;Theraband   Theraband Level (Shoulder Extension) Level 3 (Green)   Row Strengthening;Both;15 reps;Theraband   Theraband Level (Shoulder Row) Level 3 (Green)   Other Standing Exercises cabinet reaching flexion  x 10 , ball on wall circles clock wise and counter clockwise,as well as wall washing                  PT Short Term Goals - 12/10/15 1153    PT SHORT TERM GOAL #1   Title "Independent with initial HEP for Hip 10-14-15   Time 4   Period Weeks   Status Achieved   PT SHORT TERM GOAL #2   Title "Demonstrate and verbalize understanding of condition management including RICE, positioning, use of A.D., HEP. 10-14-15   Time 4   Period Weeks   Status Achieved   PT SHORT TERM GOAL #3   Title "Demonstrate  understanding of proper sitting posture, body mechanics, work ergonomics, and be more conscious of position and posture throughout the day 10-14-15   Baseline wife reminds him    Time 4   Period Weeks   Status Achieved   PT SHORT TERM GOAL #4   Title Pt will decreased pain in left hip from 5/10 to 2/10 with movement 10-14-15   Baseline no/pain/ denies pain   Time 4   Period Weeks   Status Achieved   PT SHORT TERM GOAL #5   Title Pt will demonstrate 50% greater ease in rising from chair with use of bil UE support 11-11-15   Baseline Pt perfroms 10 x without UE   Time 4   Period Weeks   Status Achieved   PT SHORT TERM GOAL #6   Title Pt will be able to perform initial HEP for left shoulder 11-11-15   Baseline updating UE HEP for home use in next 3 visit   Time 4   Period Weeks   Status Achieved   PT SHORT TERM GOAL #7   Title Pt will be able to sleep on left side with pain 3/10 or less 11-10-12  Baseline Pt sleeps on right side but prefers back and does not continue to sleep on left side but can lie on it a short time   Time 4   Period Weeks   Status Achieved   PT SHORT TERM GOAL #8   Title Pt will be able to lift arm AROM to 90 degrees in order to perform ADL's with greater ease 11-11-15   Baseline 89 degrees today A/ROM and AAROM to 105   Time 4   Period Weeks   Status Partially Met           PT Long Term Goals - 12/10/15 1155    PT LONG TERM GOAL #1   Title "Pt will be independent with advanced HEP. for left hip and left UE 12-09-15   Baseline finalizing HEP in next 4 visits   Time 8   Period Weeks   Status On-going   PT LONG TERM GOAL #2   Title Pt will be able to negotiate stairs with LRAD and no increase in pain in UE or hip left  12-09-15   Baseline no cane and using Left UE for stairrail   Time 8   Period Weeks   Status Achieved   PT LONG TERM GOAL #3   Title "Pt will tolerate standing and walking for 90 minutes  without increased pain in order to return to PLOF   12-09-15   Baseline Pt able to stand for 1 hour   Time 8   Period Weeks   Status On-going   PT LONG TERM GOAL #4   Title Pt will be at 1/10 pain or less with all functional activities and ADL's with left hip and Left UE 12-09-15   Baseline pain only with PT mobilizing arm 0/10 the rest of time   Time 8   Period Weeks   Status Achieved   PT LONG TERM GOAL #6   Title Gait velocity will improve from 1.17 ft/sec to at least 2.62 ft/sec to show improved mobiility at community level 12-09-15   Baseline 2.82 ft/sec without SPC , no assistive device   Time 8   Period Weeks   Status Achieved   PT LONG TERM GOAL #7   Title left  shoulder AROM scaption will improve to 0-115 degrees for improved overhead reaching. 12-09-15   Baseline AROM 95 after treatment scaption AAROM 105   Time 8   Period Weeks   Status On-going   PT LONG TERM GOAL #8   Title "Tolerate light resistance exercises in flexion and scaption with minimal pain with left UE 12-09-15   Baseline using red resistive band for exercise.  3-/5 flexion/abd of left UE   Time 8   Period Weeks   Status Achieved   PT LONG TERM GOAL  #9   TITLE Pt will be able to carry light groceries with bil UE without exacerbating pain 12-09-15   Time 8   Period Weeks   Status Achieved   PT LONG TERM GOAL  #10   TITLE Pt will be able to perform ADL's independently including putting on socks 12-09-15   Baseline needs assistance to dry feet after shower, however donning socks independently   Time 8   Period Weeks   Status Partially Met   PT LONG TERM GOAL  #11   TITLE Pt will be able to sleep on left shoulder for sleep at night 01-07-16   Baseline Pt prefers to sleep on right and back. Pt can lie  for a short time on left but cannot sleep   Time 8   Period Weeks   Status On-going   PT LONG TERM GOAL  #12   TITLE Pt will be able to hold trumper and play without exacerbating shoulder pain   Baseline has not tried at home   Time 8   Period Weeks    Status On-going               Plan - 12/13/15 0845    Clinical Impression Statement Pt with 90 shoulder flexion on first attempt, fatigues quickly. Reviewed hip HEP with pt and asked him to disc all hip HEP except standing 3 way hip with SLS and 1 finger suppot. Continued shoulder strength in available ROM.    PT Home Exercise Plan Continue with scaption with red t band exericise and wall /clock exericise to maximize range and doorway external rotation stretch. this note sent to Dr. Mardelle Matte for progress      Patient will benefit from skilled therapeutic intervention in order to improve the following deficits and impairments:  Abnormal gait, Decreased activity tolerance, Decreased balance, Decreased range of motion, Decreased strength, Impaired flexibility, Impaired UE functional use, Improper body mechanics, Postural dysfunction, Increased muscle spasms, Difficulty walking, Increased fascial restricitons, Decreased scar mobility  Visit Diagnosis: Stiffness of left shoulder, not elsewhere classified  Abnormal posture  Pain in left shoulder  Muscle weakness (generalized)  Left shoulder pain  Left hip pain     Problem List Patient Active Problem List   Diagnosis Date Noted  . Spherocytosis (familial) ([place]) 09/17/2015    Dorene Ar, PTA 12/13/2015, 8:51 AM  [place], [place], [address] Phone: [phone]   Fax:  [phone]  Name: Eric Larsen MRN: [id number] Date of Birth: [birth year]

## 2015-12-17 ENCOUNTER — Ambulatory Visit: Payer: Medicare Other | Attending: Orthopedic Surgery | Admitting: Physical Therapy

## 2015-12-17 DIAGNOSIS — M25612 Stiffness of left shoulder, not elsewhere classified: Secondary | ICD-10-CM | POA: Diagnosis present

## 2015-12-17 DIAGNOSIS — R29898 Other symptoms and signs involving the musculoskeletal system: Secondary | ICD-10-CM | POA: Insufficient documentation

## 2015-12-17 DIAGNOSIS — M6281 Muscle weakness (generalized): Secondary | ICD-10-CM | POA: Insufficient documentation

## 2015-12-17 DIAGNOSIS — R293 Abnormal posture: Secondary | ICD-10-CM | POA: Diagnosis present

## 2015-12-17 DIAGNOSIS — M25512 Pain in left shoulder: Secondary | ICD-10-CM | POA: Diagnosis present

## 2015-12-17 NOTE — Therapy (Signed)
[place], [place], [address] Phone: [phone]   Fax:  [phone]  Physical Therapy Treatment  Patient Details  Name: Eric Larsen MRN: [id number] Date of Birth: May 20, 1931 Referring Provider: Havery Moros MD  Encounter Date: 12/17/2015      PT End of Session - 12/17/15 1224    Visit Number 27   Number of Visits 41   Date for PT Re-Evaluation 01/07/16   Authorization Type BCBS Medicare  revaluation on 01-07-16   PT Start Time 1105   PT Stop Time 1223   PT Time Calculation (min) 78 min   Activity Tolerance Patient tolerated treatment well   Behavior During Therapy [facility] for tasks assessed/performed      No past medical history on file.  No past surgical history on file.  There were no vitals filed for this visit.      Subjective Assessment - 12/17/15 1110    Subjective No pain but sore.  i did not do as many of my exericises this weekend because [place] were at the beach   Pertinent History L THA on 08-25-15  and ORIF of Left shoulder on 08-25-15/ Rehab for Left shoulder to begin 10-14-15  AROM-  no resistance   Diagnostic tests x ray, MRI   Patient Stated Goals I would like to walk without a limp and without an assistive device , be able to get up and down form chair and be able to lift groceries and use my left arm again   Currently in Pain? No/denies            [facility] PT Assessment - 12/17/15 1216    AROM   Left Shoulder Flexion 90 Degrees  AAROM 106   Left Shoulder Internal Rotation 47 Degrees  shoulder abd 90   Left Shoulder External Rotation 33 Degrees  abd 90                     [facility] Adult PT Treatment/Exercise - 12/17/15 1216    Posture/Postural Control   Posture/Postural Control Postural limitations   Postural Limitations Increased thoracic kyphosis   Posture Comments utilizing posture on edge of seat and physio ball for thoracic extension   Self-Care   Self-Care Other  Self-Care Comments;Posture   Posture sitting posture to reduce kyphosis   Other Self-Care Comments  communiity wellness opportunities post PT   Shoulder Exercises: Pulleys   Other Pulley Exercises Pt utilizing trumpet (2.5 lb) for lifting to 90 degrees with left hand for 5 minutes with several rest breaks.   Shoulder Exercises: ROM/Strengthening   Other ROM/Strengthening Exercises utilizing 2.5 lb trumpet for left arm flexion and wt for 5 minutes with several rest breaks to maintain upright position.    Other ROM/Strengthening Exercises wall climbing and horizontal abd and add with PT assist x 15   Moist Heat Therapy   Number Minutes Moist Heat 15 Minutes   Moist Heat Location Shoulder  left and upper spine   Manual Therapy   Manual Therapy Joint mobilization;Soft tissue mobilization;Myofascial release;Passive ROM   Joint Mobilization elbow AP glide with external rotation osscillations.left shoudler [place] IR/ER at abd 90 grade 4    Soft tissue mobilization left upper trap, supscapularis, teres major and pectoralis major and minor   Myofascial Release subscapularis with shoulder flex. latissimus and teres majot   Scapular Mobilization inferior glide with rotation grade 3    Passive ROM PROM in all positions at end of range  as pt tolerates          Trigger Point Dry Needling - 12/17/15 1121    Consent Given? Yes   Education Handout Provided No   Muscles Treated Upper Body Pectoralis major;Pectoralis minor;Supraspinatus;Infraspinatus;Subscapularis;Upper trapezius  all on left only teres major and latissimus on left twitch   Upper Trapezius Response Twitch reponse elicited;Palpable increased muscle length   Pectoralis Major Response Twitch response elicited;Palpable increased muscle length   Pectoralis Minor Response Twitch response elicited;Palpable increased muscle length   Infraspinatus Response Twitch response elicited;Palpable increased muscle length   Subscapularis Response  Twitch response elicited;Palpable increased muscle length              PT Education - 12/17/15 1200    Education Details PT educated on using physio ball in sitting for thoracic extension and community resources post PT   Person(s) Educated Patient   Methods Explanation;Demonstration   Comprehension Verbalized understanding;Returned demonstration          PT Short Term Goals - 12/10/15 1153    PT SHORT TERM GOAL #1   Title "Independent with initial HEP for Hip 10-14-15   Time 4   Period Weeks   Status Achieved   PT SHORT TERM GOAL #2   Title "Demonstrate and verbalize understanding of condition management including RICE, positioning, use of A.D., HEP. 10-14-15   Time 4   Period Weeks   Status Achieved   PT SHORT TERM GOAL #3   Title "Demonstrate understanding of proper sitting posture, body mechanics, work ergonomics, and be more conscious of position and posture throughout the day 10-14-15   Baseline wife reminds him    Time 4   Period Weeks   Status Achieved   PT SHORT TERM GOAL #4   Title Pt will decreased pain in left hip from 5/10 to 2/10 with movement 10-14-15   Baseline no/pain/ denies pain   Time 4   Period Weeks   Status Achieved   PT SHORT TERM GOAL #5   Title Pt will demonstrate 50% greater ease in rising from chair with use of bil UE support 11-11-15   Baseline Pt perfroms 10 x without UE   Time 4   Period Weeks   Status Achieved   PT SHORT TERM GOAL #6   Title Pt will be able to perform initial HEP for left shoulder 11-11-15   Baseline updating UE HEP for home use in next 3 visit   Time 4   Period Weeks   Status Achieved   PT SHORT TERM GOAL #7   Title Pt will be able to sleep on left side with pain 3/10 or less 11-10-12   Baseline Pt sleeps on right side but prefers back and does not continue to sleep on left side but can lie on it a short time   Time 4   Period Weeks   Status Achieved   PT SHORT TERM GOAL #8   Title Pt will be able to lift arm  AROM to 90 degrees in order to perform ADL's with greater ease 11-11-15   Baseline 89 degrees today A/ROM and AAROM to 105   Time 4   Period Weeks   Status Partially Met           PT Long Term Goals - 12/10/15 1155    PT LONG TERM GOAL #1   Title "Pt will be independent with advanced HEP. for left hip and left UE 12-09-15   Baseline finalizing HEP in  next 4 visits   Time 8   Period Weeks   Status On-going   PT LONG TERM GOAL #2   Title Pt will be able to negotiate stairs with LRAD and no increase in pain in UE or hip left  12-09-15   Baseline no cane and using Left UE for stairrail   Time 8   Period Weeks   Status Achieved   PT LONG TERM GOAL #3   Title "Pt will tolerate standing and walking for 90 minutes  without increased pain in order to return to PLOF  12-09-15   Baseline Pt able to stand for 1 hour   Time 8   Period Weeks   Status On-going   PT LONG TERM GOAL #4   Title Pt will be at 1/10 pain or less with all functional activities and ADL's with left hip and Left UE 12-09-15   Baseline pain only with PT mobilizing arm 0/10 the rest of time   Time 8   Period Weeks   Status Achieved   PT LONG TERM GOAL #6   Title Gait velocity will improve from 1.17 ft/sec to at least 2.62 ft/sec to show improved mobiility at community level 12-09-15   Baseline 2.82 ft/sec without SPC , no assistive device   Time 8   Period Weeks   Status Achieved   PT LONG TERM GOAL #7   Title left  shoulder AROM scaption will improve to 0-115 degrees for improved overhead reaching. 12-09-15   Baseline AROM 95 after treatment scaption AAROM 105   Time 8   Period Weeks   Status On-going   PT LONG TERM GOAL #8   Title "Tolerate light resistance exercises in flexion and scaption with minimal pain with left UE 12-09-15   Baseline using red resistive band for exercise.  3-/5 flexion/abd of left UE   Time 8   Period Weeks   Status Achieved   PT LONG TERM GOAL  #9   TITLE Pt will be able to carry light  groceries with bil UE without exacerbating pain 12-09-15   Time 8   Period Weeks   Status Achieved   PT LONG TERM GOAL  #10   TITLE Pt will be able to perform ADL's independently including putting on socks 12-09-15   Baseline needs assistance to dry feet after shower, however donning socks independently   Time 8   Period Weeks   Status Partially Met   PT LONG TERM GOAL  #11   TITLE Pt will be able to sleep on left shoulder for sleep at night 01-07-16   Baseline Pt prefers to sleep on right and back. Pt can lie for a short time on left but cannot sleep   Time 8   Period Weeks   Status On-going   PT LONG TERM GOAL  #12   TITLE Pt will be able to hold trumper and play without exacerbating shoulder pain   Baseline has not tried at home   Time 8   Period Weeks   Status On-going               Plan - 12/17/15 1224    Clinical Impression Statement Pt with 90 degrees shoulder flex initially.  Pt brought trumpet 2.5 lb and had difficulty maintaining upright 90 degree flexion greater than 30 seconds.  Pt would like to be able to sustain lifting trumpet to play.  Pt with adequate AROM in order to start playing trumpet. . Community  wellness opportunities were discussed for post DC in 3 visits.  Pt with AAROM flex improved to 106 today   Rehab Potential Good   PT Frequency 2x / week   PT Duration 8 weeks   PT Treatment/Interventions ADLs/Self Care Home Management;Cryotherapy;Electrical Stimulation;Iontophoresis 87m/ml Dexamethasone;Moist Heat;Therapeutic exercise;Functional mobility training;Stair training;Gait training;Neuromuscular re-education;Patient/family education;Manual techniques;Passive range of motion;Dry needling;Taping;Ultrasound;Scar mobilization   PT Next Visit Plan Finalize HEP and continue for 3 visits to maximize AROM/strength   PT Home Exercise Plan finalized HEP and playing trumpet at home   Consulted and Agree with Plan of Care Patient;Family member/caregiver       Patient will benefit from skilled therapeutic intervention in order to improve the following deficits and impairments:  Abnormal gait, Decreased activity tolerance, Decreased balance, Decreased range of motion, Decreased strength, Impaired flexibility, Impaired UE functional use, Improper body mechanics, Postural dysfunction, Increased muscle spasms, Difficulty walking, Increased fascial restricitons, Decreased scar mobility  Visit Diagnosis: Stiffness of left shoulder, not elsewhere classified  Abnormal posture  Pain in left shoulder  Muscle weakness (generalized)  Left shoulder pain     Problem List Patient Active Problem List   Diagnosis Date Noted  . [place]   LVoncille Lo PT 12/17/2015 12:33 PM Phone: [phone]: 3[facility] [address]Phone: [phone]  Fax:  [phone] Name: Eric KanaMRN: [id number] of Birth: [birth year]

## 2015-12-19 ENCOUNTER — Ambulatory Visit: Payer: Medicare Other | Admitting: Physical Therapy

## 2015-12-19 DIAGNOSIS — M25612 Stiffness of left shoulder, not elsewhere classified: Secondary | ICD-10-CM

## 2015-12-19 DIAGNOSIS — R293 Abnormal posture: Secondary | ICD-10-CM

## 2015-12-19 DIAGNOSIS — M25512 Pain in left shoulder: Secondary | ICD-10-CM

## 2015-12-19 DIAGNOSIS — M6281 Muscle weakness (generalized): Secondary | ICD-10-CM

## 2015-12-19 NOTE — Patient Instructions (Signed)
Abduction (Active)    Raise arm out to side, with elbow straight and palm facing downwards. Do not shrug shoulder or tilt trunk. Hold __5__ seconds. Repeat __10__ times.Do 2 sets per session.  Do _2___ sessions per day.

## 2015-12-20 NOTE — Therapy (Signed)
[place], [place], [address] Phone: [phone]   Fax:  [phone]  Physical Therapy Treatment  Patient Details  Name: Eric Larsen MRN: [id number] Date of Birth: 05-Mar-1931 Referring Provider: Havery Moros MD  Encounter Date: 12/19/2015      PT End of Session - 12/19/15 1423    Visit Number 28   Number of Visits 41   Date for PT Re-Evaluation 01/07/16   Authorization Type BCBS Medicare  revaluation on 01-07-16   PT Start Time 0217   PT Stop Time 0317   PT Time Calculation (min) 60 min      No past medical history on file.  No past surgical history on file.  There were no vitals filed for this visit.      Subjective Assessment - 12/19/15 1433    Subjective Sore from the dry needling   Currently in Pain? No/denies                         [facility] Adult PT Treatment/Exercise - 12/20/15 0001    Elbow Exercises   Other elbow exercises bicep 4# 15 x 2 thumb up/thumb out   Shoulder Exercises: Standing   Flexion Strengthening;Left;10 reps;Weights   Shoulder Flexion Weight (lbs) 2   Flexion Limitations shoulder flexion with elbow flexion and 5 sec holds, alsp bringing weight to mouth to simulate trumpet playing 5 sec x 10, static hold 2# at mouth level to simulate trumpet holding x 1 minute                PT Education - 12/19/15 1502    Education provided Yes   Education Details Shoulder abduction standing with band          PT Short Term Goals - 12/10/15 1153    PT SHORT TERM GOAL #1   Title "Independent with initial HEP for Hip 10-14-15   Time 4   Period Weeks   Status Achieved   PT SHORT TERM GOAL #2   Title "Demonstrate and verbalize understanding of condition management including RICE, positioning, use of A.D., HEP. 10-14-15   Time 4   Period Weeks   Status Achieved   PT SHORT TERM GOAL #3   Title "Demonstrate understanding of proper sitting posture, body  mechanics, work ergonomics, and be more conscious of position and posture throughout the day 10-14-15   Baseline wife reminds him    Time 4   Period Weeks   Status Achieved   PT SHORT TERM GOAL #4   Title Pt will decreased pain in left hip from 5/10 to 2/10 with movement 10-14-15   Baseline no/pain/ denies pain   Time 4   Period Weeks   Status Achieved   PT SHORT TERM GOAL #5   Title Pt will demonstrate 50% greater ease in rising from chair with use of bil UE support 11-11-15   Baseline Pt perfroms 10 x without UE   Time 4   Period Weeks   Status Achieved   PT SHORT TERM GOAL #6   Title Pt will be able to perform initial HEP for left shoulder 11-11-15   Baseline updating UE HEP for home use in next 3 visit   Time 4   Period Weeks   Status Achieved   PT SHORT TERM GOAL #7   Title Pt will be able to sleep on left side with pain 3/10 or less 11-10-12   Baseline Pt  sleeps on right side but prefers back and does not continue to sleep on left side but can lie on it a short time   Time 4   Period Weeks   Status Achieved   PT SHORT TERM GOAL #8   Title Pt will be able to lift arm AROM to 90 degrees in order to perform ADL's with greater ease 11-11-15   Baseline 89 degrees today A/ROM and AAROM to 105   Time 4   Period Weeks   Status Partially Met           PT Long Term Goals - 12/10/15 1155    PT LONG TERM GOAL #1   Title "Pt will be independent with advanced HEP. for left hip and left UE 12-09-15   Baseline finalizing HEP in next 4 visits   Time 8   Period Weeks   Status On-going   PT LONG TERM GOAL #2   Title Pt will be able to negotiate stairs with LRAD and no increase in pain in UE or hip left  12-09-15   Baseline no cane and using Left UE for stairrail   Time 8   Period Weeks   Status Achieved   PT LONG TERM GOAL #3   Title "Pt will tolerate standing and walking for 90 minutes  without increased pain in order to return to PLOF  12-09-15   Baseline Pt able to stand for 1  hour   Time 8   Period Weeks   Status On-going   PT LONG TERM GOAL #4   Title Pt will be at 1/10 pain or less with all functional activities and ADL's with left hip and Left UE 12-09-15   Baseline pain only with PT mobilizing arm 0/10 the rest of time   Time 8   Period Weeks   Status Achieved   PT LONG TERM GOAL #6   Title Gait velocity will improve from 1.17 ft/sec to at least 2.62 ft/sec to show improved mobiility at community level 12-09-15   Baseline 2.82 ft/sec without SPC , no assistive device   Time 8   Period Weeks   Status Achieved   PT LONG TERM GOAL #7   Title left  shoulder AROM scaption will improve to 0-115 degrees for improved overhead reaching. 12-09-15   Baseline AROM 95 after treatment scaption AAROM 105   Time 8   Period Weeks   Status On-going   PT LONG TERM GOAL #8   Title "Tolerate light resistance exercises in flexion and scaption with minimal pain with left UE 12-09-15   Baseline using red resistive band for exercise.  3-/5 flexion/abd of left UE   Time 8   Period Weeks   Status Achieved   PT LONG TERM GOAL  #9   TITLE Pt will be able to carry light groceries with bil UE without exacerbating pain 12-09-15   Time 8   Period Weeks   Status Achieved   PT LONG TERM GOAL  #10   TITLE Pt will be able to perform ADL's independently including putting on socks 12-09-15   Baseline needs assistance to dry feet after shower, however donning socks independently   Time 8   Period Weeks   Status Partially Met   PT LONG TERM GOAL  #11   TITLE Pt will be able to sleep on left shoulder for sleep at night 01-07-16   Baseline Pt prefers to sleep on right and back. Pt can lie for a  short time on left but cannot sleep   Time 8   Period Weeks   Status On-going   PT LONG TERM GOAL  #12   TITLE Pt will be able to hold trumper and play without exacerbating shoulder pain   Baseline has not tried at home   Time 8   Period Weeks   Status On-going               Plan -  12/19/15 1517    Clinical Impression Statement 107 AAROM with pulleys. Worked on Museum/gallery exhibitions officer. Pt able to hold 2# weight at mouth level with arm in scaption x 1 minute. Attempts at standing horizontal abduction reveal  inability to lift into shoulder abduction more than 45 degrees without shoulder hike, Mirror feedback used to assist with decreasing compensations. Used yellow band to strengthen in abduction ROM without shoulder hike. No icreased pain, pt request heat post treatment.    PT Next Visit Plan Finalize HEP and continue for 3 visits to maximize AROM/strength      Patient will benefit from skilled therapeutic intervention in order to improve the following deficits and impairments:  Abnormal gait, Decreased activity tolerance, Decreased balance, Decreased range of motion, Decreased strength, Impaired flexibility, Impaired UE functional use, Improper body mechanics, Postural dysfunction, Increased muscle spasms, Difficulty walking, Increased fascial restricitons, Decreased scar mobility  Visit Diagnosis: Stiffness of left shoulder, not elsewhere classified  Abnormal posture  Pain in left shoulder  Muscle weakness (generalized)     Problem List Patient Active Problem List   Diagnosis Date Noted  . Spherocytosis (familial) ([place]) 09/17/2015    Dorene Ar, PTA 12/20/2015, 8:36 AM  [facility] [address] [place], [place], [address] Phone: [phone]   Fax:  [phone]  Name: Kamoni Gentles MRN: [id number] Date of Birth: 29-Jul-1931

## 2015-12-24 ENCOUNTER — Ambulatory Visit: Payer: Medicare Other | Admitting: Physical Therapy

## 2015-12-24 DIAGNOSIS — R293 Abnormal posture: Secondary | ICD-10-CM

## 2015-12-24 DIAGNOSIS — M25612 Stiffness of left shoulder, not elsewhere classified: Secondary | ICD-10-CM

## 2015-12-24 DIAGNOSIS — R29898 Other symptoms and signs involving the musculoskeletal system: Secondary | ICD-10-CM

## 2015-12-24 DIAGNOSIS — M25512 Pain in left shoulder: Secondary | ICD-10-CM

## 2015-12-24 DIAGNOSIS — M6281 Muscle weakness (generalized): Secondary | ICD-10-CM

## 2015-12-24 NOTE — Patient Instructions (Signed)
Pt given handout for total motion release for trunk rotation and arm raise.   Voncille Lo, PT 12/24/2015 12:12 PM Phone: [phone] Fax: [phone]

## 2015-12-24 NOTE — Therapy (Signed)
[place], [place], [address] Phone: [phone]   Fax:  [phone]  Physical Therapy Treatment  Patient Details  Name: Eric Larsen MRN: [id number] Date of Birth: August 28, 1930 Referring Provider: Havery Moros MD  Encounter Date: 12/24/2015      PT End of Session - 12/24/15 1113    Visit Number 29   Number of Visits 41   Date for PT Re-Evaluation 01/07/16   Authorization Type BCBS Medicare  revaluation on 01-07-16   PT Start Time 1108   PT Stop Time 1210   PT Time Calculation (min) 62 min   Activity Tolerance Patient tolerated treatment well   Behavior During Therapy [facility] for tasks assessed/performed      No past medical history on file.  No past surgical history on file.  There were no vitals filed for this visit.      Subjective Assessment - 12/24/15 1120    Subjective  A little sore with exercises and dry needling   Pertinent History L THA on 08-25-15  and ORIF of Left shoulder on 08-25-15/ Rehab for Left shoulder to begin 10-14-15  AROM-  no resistance   Limitations Lifting;Sitting;Standing;Walking;House hold activities   Diagnostic tests x ray, MRI   Patient Stated Goals I would like to walk without a limp and without an assistive device , be able to get up and down form chair and be able to lift groceries and use my left arm again   Currently in Pain? No/denies  when I am using it  for exericises slightly sore   Pain Score 0            [facility] PT Assessment - 12/24/15 1155    AROM   Left Shoulder Flexion 90 Degrees  post dry needling 97 and AAROM 106                     [facility] Adult PT Treatment/Exercise - 12/24/15 1115    Shoulder Exercises: Seated   Other Seated Exercises Total motion release for arm raise 15 sec to the right and then to the left 2 x 15 sec hold.  also trunk rotation to the right 15 sec x 4 hold and then to the left 2 x 15 sec hold    Moist Heat Therapy    Number Minutes Moist Heat 15 Minutes   Moist Heat Location Shoulder  left   Manual Therapy   Manual Therapy Joint mobilization;Soft tissue mobilization;Myofascial release;Passive ROM   Joint Mobilization elbow AP glide with external rotation osscillations.left shoudler [place] IR/ER at abd 90 grade 4    Soft tissue mobilization left upper trap, supscapularis,latisimus teres major and pectoralis major and minor   Myofascial Release subscapularis with shoulder flex. latissimus and teres majot   Scapular Mobilization inferior glide with rotation grade 3    Passive ROM PROM in all positions at end of range as pt tolerates          Trigger Point Dry Needling - 12/24/15 1125    Consent Given? Yes   Education Handout Provided No   Muscles Treated Upper Body Pectoralis major;Pectoralis minor              PT Education - 12/24/15 1212    Education provided Yes   Education Details Pt /wife instructed on total motion release with trunk rotation and arm raise   Person(s) Educated Patient   Methods Explanation;Demonstration;Handout   Comprehension Verbalized understanding;Returned demonstration  PT Short Term Goals - 12/10/15 1153    PT SHORT TERM GOAL #1   Title "Independent with initial HEP for Hip 10-14-15   Time 4   Period Weeks   Status Achieved   PT SHORT TERM GOAL #2   Title "Demonstrate and verbalize understanding of condition management including RICE, positioning, use of A.D., HEP. 10-14-15   Time 4   Period Weeks   Status Achieved   PT SHORT TERM GOAL #3   Title "Demonstrate understanding of proper sitting posture, body mechanics, work ergonomics, and be more conscious of position and posture throughout the day 10-14-15   Baseline wife reminds him    Time 4   Period Weeks   Status Achieved   PT SHORT TERM GOAL #4   Title Pt will decreased pain in left hip from 5/10 to 2/10 with movement 10-14-15   Baseline no/pain/ denies pain   Time 4   Period Weeks    Status Achieved   PT SHORT TERM GOAL #5   Title Pt will demonstrate 50% greater ease in rising from chair with use of bil UE support 11-11-15   Baseline Pt perfroms 10 x without UE   Time 4   Period Weeks   Status Achieved   PT SHORT TERM GOAL #6   Title Pt will be able to perform initial HEP for left shoulder 11-11-15   Baseline updating UE HEP for home use in next 3 visit   Time 4   Period Weeks   Status Achieved   PT SHORT TERM GOAL #7   Title Pt will be able to sleep on left side with pain 3/10 or less 11-10-12   Baseline Pt sleeps on right side but prefers back and does not continue to sleep on left side but can lie on it a short time   Time 4   Period Weeks   Status Achieved   PT SHORT TERM GOAL #8   Title Pt will be able to lift arm AROM to 90 degrees in order to perform ADL's with greater ease 11-11-15   Baseline 89 degrees today A/ROM and AAROM to 105   Time 4   Period Weeks   Status Partially Met           PT Long Term Goals - 12/10/15 1155    PT LONG TERM GOAL #1   Title "Pt will be independent with advanced HEP. for left hip and left UE 12-09-15   Baseline finalizing HEP in next 4 visits   Time 8   Period Weeks   Status On-going   PT LONG TERM GOAL #2   Title Pt will be able to negotiate stairs with LRAD and no increase in pain in UE or hip left  12-09-15   Baseline no cane and using Left UE for stairrail   Time 8   Period Weeks   Status Achieved   PT LONG TERM GOAL #3   Title "Pt will tolerate standing and walking for 90 minutes  without increased pain in order to return to PLOF  12-09-15   Baseline Pt able to stand for 1 hour   Time 8   Period Weeks   Status On-going   PT LONG TERM GOAL #4   Title Pt will be at 1/10 pain or less with all functional activities and ADL's with left hip and Left UE 12-09-15   Baseline pain only with PT mobilizing arm 0/10 the rest of time   Time  8   Period Weeks   Status Achieved   PT LONG TERM GOAL #6   Title Gait velocity  will improve from 1.17 ft/sec to at least 2.62 ft/sec to show improved mobiility at community level 12-09-15   Baseline 2.82 ft/sec without SPC , no assistive device   Time 8   Period Weeks   Status Achieved   PT LONG TERM GOAL #7   Title left  shoulder AROM scaption will improve to 0-115 degrees for improved overhead reaching. 12-09-15   Baseline AROM 95 after treatment scaption AAROM 105   Time 8   Period Weeks   Status On-going   PT LONG TERM GOAL #8   Title "Tolerate light resistance exercises in flexion and scaption with minimal pain with left UE 12-09-15   Baseline using red resistive band for exercise.  3-/5 flexion/abd of left UE   Time 8   Period Weeks   Status Achieved   PT LONG TERM GOAL  #9   TITLE Pt will be able to carry light groceries with bil UE without exacerbating pain 12-09-15   Time 8   Period Weeks   Status Achieved   PT LONG TERM GOAL  #10   TITLE Pt will be able to perform ADL's independently including putting on socks 12-09-15   Baseline needs assistance to dry feet after shower, however donning socks independently   Time 8   Period Weeks   Status Partially Met   PT LONG TERM GOAL  #11   TITLE Pt will be able to sleep on left shoulder for sleep at night 01-07-16   Baseline Pt prefers to sleep on right and back. Pt can lie for a short time on left but cannot sleep   Time 8   Period Weeks   Status On-going   PT LONG TERM GOAL  #12   TITLE Pt will be able to hold trumper and play without exacerbating shoulder pain   Baseline has not tried at home   Time 8   Period Weeks   Status On-going               Plan - 12/24/15 1218    Clinical Impression Statement Pt continues to  exericise and not complain of pain except at end range of motion.  Pt limited by surgical hardware for flex/abd.  Pt has been working on ER/IR and scapular strength. Pt  has one more appt to maximize dry needling benefit and will be discharged with HEP>  Pt educated on total motion  release exeriicse for full body flexibilty.  will Assees goals next visit, No goas achieved today   PT Frequency 2x / week   PT Duration 8 weeks   PT Treatment/Interventions ADLs/Self Care Home Management;Cryotherapy;Electrical Stimulation;Iontophoresis 26m/ml Dexamethasone;Moist Heat;Therapeutic exercise;Functional mobility training;Stair training;Gait training;Neuromuscular re-education;Patient/family education;Manual techniques;Passive range of motion;Dry needling;Taping;Ultrasound;Scar mobilization   PT Next Visit Plan Discharge next vist.  Pt sent FOTO for 30th visit next time and DC  FOTO sent by email for 28th viist but shoulde be for 30th   PT Home Exercise Plan finalized HEP and playing trumpet at home   Consulted and Agree with Plan of Care Patient;Family member/caregiver      Patient will benefit from skilled therapeutic intervention in order to improve the following deficits and impairments:  Abnormal gait, Decreased activity tolerance, Decreased balance, Decreased range of motion, Decreased strength, Impaired flexibility, Impaired UE functional use, Improper body mechanics, Postural dysfunction, Increased muscle spasms, Difficulty walking, Increased  fascial restricitons, Decreased scar mobility  Visit Diagnosis: Stiffness of left shoulder, not elsewhere classified  Abnormal posture  Pain in left shoulder  Muscle weakness (generalized)  Other symptoms and signs involving the musculoskeletal system     Problem List Patient Active Problem List   Diagnosis Date Noted  . Spherocytosis (familial) ([place]) 09/17/2015    Voncille Lo, PT 12/24/2015 12:24 PM Phone: [phone] Fax: [place] [facility] [address] [place], [place], [address] Phone: [phone]   Fax:  [phone]  Name: Eric Larsen MRN: [id number] Date of Birth: 1931/07/01

## 2015-12-26 ENCOUNTER — Encounter: Payer: Medicare Other | Admitting: Physical Therapy

## 2015-12-31 ENCOUNTER — Encounter: Payer: Medicare Other | Admitting: Physical Therapy

## 2015-12-31 ENCOUNTER — Ambulatory Visit: Payer: Medicare Other | Admitting: Physical Therapy

## 2015-12-31 DIAGNOSIS — M6281 Muscle weakness (generalized): Secondary | ICD-10-CM

## 2015-12-31 DIAGNOSIS — M25512 Pain in left shoulder: Secondary | ICD-10-CM

## 2015-12-31 DIAGNOSIS — R293 Abnormal posture: Secondary | ICD-10-CM

## 2015-12-31 DIAGNOSIS — M25612 Stiffness of left shoulder, not elsewhere classified: Secondary | ICD-10-CM | POA: Diagnosis not present

## 2015-12-31 DIAGNOSIS — R29898 Other symptoms and signs involving the musculoskeletal system: Secondary | ICD-10-CM

## 2015-12-31 NOTE — Therapy (Signed)
[place], [place], [address] Phone: [phone]   Fax:  [phone]  Physical Therapy Treatment/Discharge Note  Patient Details  Name: Eric Larsen MRN: [id number] Date of Birth: [birth year] Referring Provider: Havery Moros MD  Encounter Date: 12/31/2015      PT End of Session - 12/31/15 1156    Visit Number 30   Number of Visits 41   Date for PT Re-Evaluation 01/07/16   Authorization Type BC[employer] Medicare  revaluation on 01-07-16   PT Start Time 1155   PT Stop Time 1310   PT Time Calculation (min) 75 min   Activity Tolerance Patient tolerated treatment well   Behavior During Therapy [facility] for tasks assessed/performed      No past medical history on file.  No past surgical history on file.  There were no vitals filed for this visit.      Subjective Assessment - 12/31/15 1202    Subjective Arm is still limited at the end but it doesnt hurt.  My hip doesnt hurt   Pertinent History L THA on 08-25-15  and ORIF of Left shoulder on 08-25-15/ Rehab for Left shoulder to begin 10-14-15  AROM-    Limitations Lifting;Sitting;Standing;Walking;House hold activities   How long can you sit comfortably? 2 hour   How long can you stand comfortably? 1-2 hour   How long can you walk comfortably? 1 hour to 90 minutes using cart at [facility]   Diagnostic tests x ray, MRI   Patient Stated Goals I would like to walk without a limp and without an assistive device , be able to get up and down form chair and be able to lift groceries and use my left arm again   Pain Location Shoulder   Pain Orientation Left   Pain Descriptors / Indicators Tightness   Pain Score 0   Pain Location Hip   Pain Orientation Left            [facility] PT Assessment - 12/31/15 1205    Observation/Other Assessments   Focus on Therapeutic Outcomes (FOTO)  FOTO for LE Intake is 50% limited 50% predicted 39%   intially 77% limitation   Lower Extremity  Functional Scale  49/80 or 39 % limitation  for lower extremity.     Posture/Postural Control   Posture/Postural Control Postural limitations   Postural Limitations Increased thoracic kyphosis   AROM   Right Shoulder Extension 40 Degrees   Right Shoulder Flexion 128 Degrees   Right Shoulder ABduction 120 Degrees   Right Shoulder Internal Rotation 60 Degrees   Right Shoulder External Rotation 68 Degrees   Right Shoulder Horizontal ABduction 88 Degrees   Right Shoulder Horizontal  ADduction 130 Degrees   Left Shoulder Extension 40 Degrees   Left Shoulder Flexion 90 Degrees  AAROM 106 PT assist or pt holds on wall.   Left Shoulder ABduction 70 Degrees   Left Shoulder Internal Rotation 47 Degrees  shoulder abd 90 and PT assist   Left Shoulder External Rotation 33 Degrees  abd 90 and PT assisting at rest Pt holds in -15 ER severe limitation    Right Elbow Extension 0   Left Elbow Extension 5   Right Hip Flexion 110   Right Hip External Rotation  35   Right Hip Internal Rotation  25   Left Hip Flexion 109   Left Hip External Rotation  25   Left Hip Internal Rotation  17  tight   Right Knee  Extension 3   Right Knee Flexion 140   Left Knee Extension 0   Left Knee Flexion 132   Strength   Overall Strength Comments Right UE grossly 4-/5  in avalable range   Left Shoulder Flexion 3-/5   Left Shoulder Extension 3-/5   Left Shoulder ABduction 3-/5   Left Shoulder Internal Rotation 4-/5   Left Shoulder External Rotation 3-/5   Right Hand Grip (lbs) 74 73 72   Left Hand Grip (lbs) 64, 64 , 61   Palpation   Palpation comment Pt unable to surpass current AROM due to hard end feel with flex and abducation for left shoulder due to surgical hardware   Ambulation/Gait   Ambulation/Gait Yes   Ambulation/Gait Assistance 7: Independent   Assistive device None   Gait Pattern Within Functional Limits   Ambulation Surface Level   Gait velocity 4.33 ft/sec                      [facility] Adult PT Treatment/Exercise - 12/31/15 1315    Self-Care   Self-Care Other Self-Care Comments   Posture reviewed importance of sitting and standing posture   Other Self-Care Comments  Pt given tips on maintainins availbble AROM and strength within limits of hardware   Moist Heat Therapy   Number Minutes Moist Heat 15 Minutes   Moist Heat Location Shoulder  left   Manual Therapy   Manual Therapy Joint mobilization;Soft tissue mobilization;Myofascial release;Passive ROM   Joint Mobilization elbow AP glide with external rotation osscillations.left shoudler [place] IR/ER at abd 90 grade 4    Soft tissue mobilization  supscapularis,latisimus teres major and pectoralis major and minor   Myofascial Release subscapularis with shoulder flex. latissimus and teres majot   Scapular Mobilization inferior glide with rotation grade 3 limited by hardware   Passive ROM PROM in all positions at end of range as pt tolerates          Trigger Point Dry Needling - 12/31/15 1226    Consent Given? Yes   Muscles Treated Upper Body Pectoralis major;Subscapularis  left teres Major and latissimus palpable lengthening   Pectoralis Major Response Twitch response elicited;Palpable increased muscle length   Subscapularis Response Twitch response elicited;Palpable increased muscle length              PT Education - 12/31/15 1255    Education provided Yes   Education Details Pt reviewed HEP and timeline for healing.  possibility of hardware removal for left shoulder obstructing AROM.    Person(s) Educated Patient;Spouse   Methods Explanation;Demonstration   Comprehension Verbalized understanding;Returned demonstration          PT Short Term Goals - 12/10/15 1153    PT SHORT TERM GOAL #1   Title "Independent with initial HEP for Hip 10-14-15   Time 4   Period Weeks   Status Achieved   PT SHORT TERM GOAL #2   Title "Demonstrate and verbalize understanding of condition management including  RICE, positioning, use of A.D., HEP. 10-14-15   Time 4   Period Weeks   Status Achieved   PT SHORT TERM GOAL #3   Title "Demonstrate understanding of proper sitting posture, body mechanics, work ergonomics, and be more conscious of position and posture throughout the day 10-14-15   Baseline wife reminds him    Time 4   Period Weeks   Status Achieved   PT SHORT TERM GOAL #4   Title Pt will decreased pain in left hip  from 5/10 to 2/10 with movement 10-14-15   Baseline no/pain/ denies pain   Time 4   Period Weeks   Status Achieved   PT SHORT TERM GOAL #5   Title Pt will demonstrate 50% greater ease in rising from chair with use of bil UE support 11-11-15   Baseline Pt perfroms 10 x without UE   Time 4   Period Weeks   Status Achieved   PT SHORT TERM GOAL #6   Title Pt will be able to perform initial HEP for left shoulder 11-11-15   Baseline updating UE HEP for home use in next 3 visit   Time 4   Period Weeks   Status Achieved   PT SHORT TERM GOAL #7   Title Pt will be able to sleep on left side with pain 3/10 or less 11-10-12   Baseline Pt sleeps on right side but prefers back and does not continue to sleep on left side but can lie on it a short time   Time 4   Period Weeks   Status Achieved   PT SHORT TERM GOAL #8   Title Pt will be able to lift arm AROM to 90 degrees in order to perform ADL's with greater ease 11-11-15   Baseline 89 degrees today A/ROM and AAROM to 105   Time 4   Period Weeks   Status Partially Met           PT Long Term Goals - 12/31/15 1215    PT LONG TERM GOAL #1   Title "Pt will be independent with advanced HEP. for left hip and left UE 12-09-15   Time 8   Period Weeks   Status Achieved   PT LONG TERM GOAL #2   Title Pt will be able to negotiate stairs with LRAD and no increase in pain in UE or hip left  12-09-15   Baseline no cane and using Left UE for stairrail   Time 8   Period Weeks   Status Achieved   PT LONG TERM GOAL #3   Title "Pt will  tolerate standing and walking for 90 minutes  without increased pain in order to return to PLOF  12-09-15   Time 8   Period Weeks   Status Achieved   PT LONG TERM GOAL #4   Title Pt will be at 1/10 pain or less with all functional activities and ADL's with left hip and Left UE 12-09-15   Baseline pain only with PT mobilizing arm 0/10 the rest of time at end of range and blocked by hardware   Time 8   Period Weeks   Status Achieved   PT LONG TERM GOAL #5   Title LEFS will improve to at least 50 % limtation or 49/80 12-09-15   Baseline LEFS at 39%   or 49/80 last taken   Time 8   Period Weeks   Status Achieved   PT LONG TERM GOAL #6   Title Gait velocity will improve from 1.17 ft/sec to at least 2.62 ft/sec to show improved mobiility at community level 12-09-15   Baseline 4.33 ft/sec without SPC, no assistive device   Time 8   Period Weeks   Status Achieved   PT LONG TERM GOAL #7   Title left  shoulder AROM scaption will improve to 0-115 degrees for improved overhead reaching. 12-09-15   Baseline AROM 90 after treatment scaption AAROM 106 after stretching but blocked by hardware in left arm, blocked by  hardware   Time 8   Period Weeks   Status Not Met   PT LONG TERM GOAL #8   Title "Tolerate light resistance exercises in flexion and scaption with minimal pain with left UE 12-09-15   Baseline using red resistive band for exercise.  3-/5 flexion/abd of left UE   Time 8   Period Weeks   Status Achieved   PT LONG TERM GOAL  #9   TITLE Pt will be able to carry light groceries with bil UE without exacerbating pain 12-09-15   Time 8   Period Weeks   Status Achieved   PT LONG TERM GOAL  #10   TITLE Pt will be able to perform ADL's independently including putting on socks 12-09-15   Baseline able to now put on socks but feels tightness of left arm on left foot   Time 8   Period Weeks   Status Achieved   PT LONG TERM GOAL  #11   TITLE Pt will be able to sleep on left shoulder for sleep at  night 01-07-16   Baseline Pt prefers to sleep on right and back. Pt can lie for a short time on left but cannot sleep   Time 8   Period Weeks   Status Partially Met   PT LONG TERM GOAL  #12   TITLE Pt will be able to hold trumper and play without exacerbating shoulder pain   Baseline Pt unable to sustain for playing music due to limted AROM and hard ware restriction.  Pt stands to play music   Time 8   Period Weeks   Status Not Met               Plan - 12/31/15 1330    Clinical Impression Statement Eric Larsen has been an exemplary patient and has exceeded all goals for R LE. He has achieved all goals or partially met all goals except for AROM  or being able to return to play the trumpet.  Pt is now ambulating without an assitive device and is able to perform gait velocity at a normal speed of 4.33 ft/sec.  Pt  has been compliant with all exericises and  dilligently completes at home with help and encouragement of his wife.  Pt  FOTO iinitially for left shoulder was 77% limited and  is now at 50% limited , Predicted was 39%  Althought Eric Larsen has been compliant with all exericises and and suggestions by PT , he has maximiized rehab potential at this time.  His main limitation is the surgical Hardware that causes a hard endfeell and iniablity to progress Past 95degrees of flex or 70 degrees of abduction.  Due to limitations iin AROM, Pt strength is 3-/5 in L UE , In available AROM he is 4-/5. Eric Larsen is an extraordinary  82 yo and is severely limited due to the surgical hardware in left Upper extremity.  He is being closely followed by his MD Dr. Mardelle Matte and has been told that removal of hard ware cannot be done until at least 8 months to 9 months healing time.. Eric Larsen understands this , but in the meantime his AROM/strength in the left arm is severly limited. and he cannot return to activities including playing his trumpet in which his spouse and he enjoy sharing and playing  music.  It is my hope that with the HEP and  his mainitenance of available range., that Eric Larsen will eventually be able to remove the  surgical hardware that is  limiting  any movement  over 90 degrees flexion and 70 degrees abduction.   Severely limited Left shoulder ER at rest to -15 and with PT AAROM 15,  Passive by PT can maximally 33 but not sustained.  Thanls for the privilege of serving Eric Larsen.    Rehab Potential Good   PT Frequency 2x / week   PT Duration 8 weeks   PT Treatment/Interventions ADLs/Self Care Home Management;Cryotherapy;Electrical Stimulation;Iontophoresis 82m/ml Dexamethasone;Moist Heat;Therapeutic exercise;Functional mobility training;Stair training;Gait training;Neuromuscular re-education;Patient/family education;Manual techniques;Passive range of motion;Dry needling;Taping;Ultrasound;Scar mobilization   PT Next Visit Plan discharge   Consulted and Agree with Plan of Care Patient;Family member/caregiver      Patient will benefit from skilled therapeutic intervention in order to improve the following deficits and impairments:  Abnormal gait, Decreased activity tolerance, Decreased balance, Decreased range of motion, Decreased strength, Impaired flexibility, Impaired UE functional use, Improper body mechanics, Postural dysfunction, Increased muscle spasms, Difficulty walking, Increased fascial restricitons, Decreased scar mobility  Visit Diagnosis: Stiffness of left shoulder, not elsewhere classified  Abnormal posture  Pain in left shoulder  Muscle weakness (generalized)  Other symptoms and signs involving the musculoskeletal system  Left shoulder pain       G-Codes - 005/23/20171315    Functional Assessment Tool Used Clinical Judgement for LE and FOTO for UE   Functional Limitation Mobility: Walking and moving around;Carrying, moving and handling objects   Mobility: Walking and Moving Around Goal Status ([id number] At least 20 percent but less than 40  percent impaired, limited or restricted   Mobility: Walking and Moving Around Discharge Status ([id number] At least 20 percent but less than 40 percent impaired, limited or restricted   Carrying, Moving and Handling Objects Goal Status (([id number] At least 20 percent but less than 40 percent impaired, limited or restricted   Carrying, Moving and Handling Objects Discharge Status ([id number] At least 40 percent but less than 60 percent impaired, limited or restricted  50%      Problem List Patient Active Problem List   Diagnosis Date Noted  . Spherocytosis (familial) ([place] 09/17/2015   LVoncille Lo PT 005/23/173:11 PM Phone: [phone]: 3[facility] [address] [address]Phone: [phone]  Fax:  [phone] Name: WRaheel KunkleMRN: [id number] of Birth: 4Jul 09, 1932  PHYSICAL THERAPY DISCHARGE SUMMARY  Visits from Start of Care: 30  Current functional level related to goals / functional outcomes: See above   Remaining deficits: Severely limited due to surgical hardware in Left UE, limited to 90 degrees flex/ 70 abd and ER to 0, at rest -15,  PROM with PT to 33   Education / Equipment: HEP  Plan: Patient agrees to discharge.  Patient goals were partially met. Patient is being discharged due to lack of progress.  ????? maximizing rehab potential and AROM limited in L UE due to surgical hardware.   [facility] able to walk at 4.33 ft/sec in Normal walking and community range          LVoncille Lo PT 02017-05-233:16 PM Phone: [phone]: [phone]

## 2017-02-02 DIAGNOSIS — Z23 Encounter for immunization: Secondary | ICD-10-CM | POA: Diagnosis not present

## 2017-02-02 DIAGNOSIS — Y929 Unspecified place or not applicable: Secondary | ICD-10-CM | POA: Diagnosis not present

## 2017-02-02 DIAGNOSIS — S299XXA Unspecified injury of thorax, initial encounter: Secondary | ICD-10-CM | POA: Diagnosis present

## 2017-02-02 DIAGNOSIS — S51802A Unspecified open wound of left forearm, initial encounter: Secondary | ICD-10-CM | POA: Insufficient documentation

## 2017-02-02 DIAGNOSIS — S300XXA Contusion of lower back and pelvis, initial encounter: Secondary | ICD-10-CM | POA: Insufficient documentation

## 2017-02-02 DIAGNOSIS — S20212A Contusion of left front wall of thorax, initial encounter: Secondary | ICD-10-CM | POA: Insufficient documentation

## 2017-02-02 DIAGNOSIS — W010XXA Fall on same level from slipping, tripping and stumbling without subsequent striking against object, initial encounter: Secondary | ICD-10-CM | POA: Insufficient documentation

## 2017-02-02 DIAGNOSIS — Y999 Unspecified external cause status: Secondary | ICD-10-CM | POA: Insufficient documentation

## 2017-02-02 DIAGNOSIS — Y9301 Activity, walking, marching and hiking: Secondary | ICD-10-CM | POA: Diagnosis not present

## 2017-02-02 DIAGNOSIS — S2242XA Multiple fractures of ribs, left side, initial encounter for closed fracture: Secondary | ICD-10-CM | POA: Insufficient documentation

## 2017-02-03 ENCOUNTER — Encounter (HOSPITAL_COMMUNITY): Payer: Self-pay | Admitting: Emergency Medicine

## 2017-02-03 ENCOUNTER — Emergency Department (HOSPITAL_COMMUNITY): Payer: Medicare Other

## 2017-02-03 ENCOUNTER — Emergency Department (HOSPITAL_COMMUNITY)
Admission: EM | Admit: 2017-02-03 | Discharge: 2017-02-03 | Disposition: A | Discharge status: Home / Self Care | Payer: Medicare Other | Attending: Emergency Medicine | Admitting: Emergency Medicine

## 2017-02-03 DIAGNOSIS — S2242XA Multiple fractures of ribs, left side, initial encounter for closed fracture: Secondary | ICD-10-CM | POA: Diagnosis not present

## 2017-02-03 DIAGNOSIS — W19XXXA Unspecified fall, initial encounter: Secondary | ICD-10-CM

## 2017-02-03 DIAGNOSIS — S300XXA Contusion of lower back and pelvis, initial encounter: Secondary | ICD-10-CM

## 2017-02-03 DIAGNOSIS — S20212A Contusion of left front wall of thorax, initial encounter: Secondary | ICD-10-CM

## 2017-02-03 DIAGNOSIS — Y92009 Unspecified place in unspecified non-institutional (private) residence as the place of occurrence of the external cause: Secondary | ICD-10-CM

## 2017-02-03 DIAGNOSIS — S51012A Laceration without foreign body of left elbow, initial encounter: Secondary | ICD-10-CM

## 2017-02-03 HISTORY — DX: Age-related osteoporosis without current pathological fracture: M81.0

## 2017-02-03 HISTORY — DX: Disorder of thyroid, unspecified: E07.9

## 2017-02-03 MED ORDER — BACITRACIN ZINC 500 UNIT/GM EX OINT
TOPICAL_OINTMENT | Freq: Once | CUTANEOUS | Status: AC
  Administered 2017-02-03: 2 via TOPICAL
  Filled 2017-02-03: qty 1.8

## 2017-02-03 MED ORDER — TETANUS-DIPHTH-ACELL PERTUSSIS 5-2.5-18.5 LF-MCG/0.5 IM SUSP
0.5000 mL | Freq: Once | INTRAMUSCULAR | Status: AC
  Administered 2017-02-03: 0.5 mL via INTRAMUSCULAR
  Filled 2017-02-03: qty 0.5

## 2017-02-03 NOTE — ED Provider Notes (Signed)
[facility] DEPT Provider Note   CSN: [id number] Arrival date & time: 02/02/17  2334  By signing my name below, I, Theresia Bough, attest that this documentation has been prepared under the direction and in the presence of Delora Fuel, MD. Electronically Signed: Theresia Bough, ED Scribe. 02/03/17. 2:53 AM.  History   Chief Complaint Chief Complaint  Patient presents with  . Fall  . skin tear   The history is provided by the patient. No language interpreter was used.   HPI Comments: Eric Larsen is a 83 y.o. male with a PMHx of osteoporosis, who presents to the Emergency Department complaining of 8/10 sudden onset, moderate left rib pain onset this evening. Pt states he was walking his dog and he fell when she pulled him off balance. Pt reports associated right groin pain, left arm pain and skin tear to his left arm/elbow. Pt states he does not know when his last tetanus shot was. No use of blood thinners or medications for platelets. No other complaints at this time.  Past Medical History:  Diagnosis Date  . Osteoporosis   . Thyroid disease     Patient Active Problem List   Diagnosis Date Noted  . Spherocytosis (familial) ([place]) 09/17/2015    Past Surgical History:  Procedure Laterality Date  . HIP FRACTURE SURGERY    . SHOULDER SURGERY         Home Medications    Prior to Admission medications   Medication Sig Start Date End Date Taking? Authorizing Provider  Folic Acid-Vit H4-RDE Y81 (FOLBEE) 2.5-25-1 MG TABS tablet Take 1 tablet by mouth daily.    [provider]  levothyroxine (SYNTHROID, LEVOTHROID) 112 MCG tablet Take 112 mcg by mouth daily before breakfast.    [provider]    Family History History reviewed. No pertinent family history.  Social History Social History  Substance Use Topics  . Smoking status: Never Smoker  . Smokeless tobacco: Never Used  . Alcohol use Yes     Comment: social     Allergies   Patient  has no known allergies.   Review of Systems Review of Systems  Musculoskeletal: Positive for arthralgias and myalgias.  Skin: Positive for wound.  All other systems reviewed and are negative.    Physical Exam Updated Vital Signs BP 135/81 (BP Location: Right Arm)   Pulse 77   Temp 97.7 F (36.5 C) (Oral)   Resp 16   Ht 5\' 4"  (1.626 m)   Wt 125 lb (56.7 kg)   SpO2 100%   BMI 21.46 kg/m   Physical Exam  Constitutional: He is oriented to person, place, and time. He appears well-developed and well-nourished.  HENT:  Head: Normocephalic and atraumatic.  Eyes: EOM are normal. Pupils are equal, round, and reactive to light.  Neck: Normal range of motion. Neck supple. No JVD present.  Cardiovascular: Normal rate, regular rhythm and normal heart sounds.   No murmur heard. Pulmonary/Chest: Effort normal and breath sounds normal. He has no wheezes. He has no rales. He exhibits tenderness.  Moderate tenderness to the left later chest wall. No crepitus.   Abdominal: Soft. Bowel sounds are normal. He exhibits no distension and no mass. There is tenderness.  Mild tenderness over the symphysis.   Musculoskeletal: Normal range of motion. He exhibits no edema.  Skin tear to the left elbow. No swelling. Full passive ROM of all joints without pain.   Lymphadenopathy:    He has no cervical adenopathy.  Neurological: He  is alert and oriented to person, place, and time. No cranial nerve deficit. He exhibits normal muscle tone. Coordination normal.  Skin: Skin is warm and dry. No rash noted.  Psychiatric: He has a normal mood and affect. His behavior is normal. Judgment and thought content normal.  Nursing note and vitals reviewed.    ED Treatments / Results  DIAGNOSTIC STUDIES: Oxygen Saturation is 100% on RA, normal by my interpretation.   COORDINATION OF CARE: 2:46 AM-Discussed next steps with pt including tetanus shot, XRs and proper wound care. Pt verbalized understanding and is  agreeable with the plan.    Radiology Dg Ribs Unilateral W/chest Left  Result Date: 02/03/2017 CLINICAL DATA:  Fall while walking dog.  Left rib pain. EXAM: LEFT RIBS AND CHEST - 3+ VIEW COMPARISON:  None. FINDINGS: Fractures of lateral left eighth and ninth ribs likely have surrounding callus, suspect these are old. Normal heart size. Tortuosity of the thoracic aorta. No pneumothorax, consolidation to suggest contusion or pleural effusion. The lungs are clear. Surgical hardware in the left proximal humerus. IMPRESSION: Fractures of lateral left eighth and ninth ribs likely have surrounding callus, suspect these are remote fractures. No definite acute fractures seen. No pulmonary complication. Electronically Signed   By: Jeb Levering M.D.   On: 02/03/2017 03:25   Dg Pelvis 1-2 Views  Result Date: 02/03/2017 CLINICAL DATA:  83 year old male with fall and right hip pain. EXAM: PELVIS - 1-2 VIEW COMPARISON:  None. FINDINGS: There is no acute fracture or dislocation. The bones are osteopenic. 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 No evidence of arthroplasty loosening by radiograph. Heterotopic bone noted adjacent to the greater trochanter of the left femur. 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 There is a 17 x 13 mm sclerotic lesion in the right iliac bone which is not well characterized but appears nonaggressive and may represent a bone island. There is degenerative changes of the lower lumbar spine. The soft tissues appear unremarkable. IMPRESSION: 1. No acute fracture or dislocation. 2. Osteopenia with degenerative changes of the visualized lower lumbar spine. 3. Benign-appearing sclerotic lesion of the right iliac bone, likely a bone island. 4. 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 Total left hip arthroplasty appears intact. Electronically Signed   By: Anner Crete M.D.   On: 02/03/2017 03:22    Procedures Procedures (including  critical care time)  Medications Ordered in ED Medications  Tdap (BOOSTRIX) injection 0.5 mL (not administered)     Initial Impression / Assessment and Plan / ED Course  I have reviewed the triage vital signs and the nursing notes.  Pertinent labs & imaging results that were available during my care of the patient were reviewed by me and considered in my medical decision making (see chart for details).  Fall with injury to left chest wall, pelvis, skin tear to left elbow. He is sent for x-rays which showed no acute injury. Tdap booster is given. He is discharged with instructions for care of skin tear, and for contusions. Told to use over-the-counter analgesics as needed.  Final Clinical Impressions(s) / ED Diagnoses   Final diagnoses:  Fall at home, initial encounter  Skin tear of left elbow without complication, initial encounter  Contusion of rib on left side, initial encounter  Contusion of pelvis, initial encounter    New Prescriptions New Prescriptions   No medications on file   I personally performed the services described in this documentation, which was  scribed in my presence. The recorded information has been reviewed and is accurate.       Delora Fuel, MD 71/27/87 [phone]

## 2017-02-03 NOTE — Discharge Instructions (Signed)
Take acetaminophen 650 mg every 4-6 hours as needed for pain. Take either ibuprofen 400 mg every 6 hours as needed, or naproxen 220 mg twice a day as needed for pain. Acetaminophen and either ibuprofen or naproxen can be combined for additional pain relief.

## 2017-02-03 NOTE — ED Triage Notes (Signed)
Pt states he was walking the dog and fell  Pt states he hurt his left arm, skin tear noted and pulled his right groin area

## 2017-04-28 ENCOUNTER — Ambulatory Visit (INDEPENDENT_AMBULATORY_CARE_PROVIDER_SITE_OTHER): Payer: Medicare Other | Admitting: Podiatry

## 2017-04-28 ENCOUNTER — Ambulatory Visit (INDEPENDENT_AMBULATORY_CARE_PROVIDER_SITE_OTHER): Payer: Medicare Other

## 2017-04-28 ENCOUNTER — Encounter: Payer: Self-pay | Admitting: Podiatry

## 2017-04-28 VITALS — BP 134/64 | HR 77 | Resp 18

## 2017-04-28 DIAGNOSIS — L84 Corns and callosities: Secondary | ICD-10-CM

## 2017-04-28 DIAGNOSIS — M2012 Hallux valgus (acquired), left foot: Secondary | ICD-10-CM | POA: Diagnosis not present

## 2017-04-28 DIAGNOSIS — M2042 Other hammer toe(s) (acquired), left foot: Secondary | ICD-10-CM | POA: Diagnosis not present

## 2017-04-28 NOTE — Progress Notes (Signed)
   Subjective:    Patient ID: Eric Larsen, male    DOB: [birth year], 83 y.o.   MRN: [id number]  HPI  Goes by "Eric Larsen"  83 year old male presents the office today for concerns of his toes becoming crooked as well as them rubbing each other. This been ongoing for quite some time. He states he is tried changing shoes which has been helping is also change and inserts have issues which help but he states that the big toe the second toe continue to wrap is about the corn in between his toes. He denies any recent injury or trauma. Denies any swelling or redness. He hasn't answered tingling to his toes. He has no other concerns today.  Review of Systems  All other systems reviewed and are negative.      Objective:   Physical Exam General: AAO x3, NAD  Dermatological: Hyperkeratotic tissue present along the IPJ of the medial aspect the left second toe. No underlying ulceration, drainage or any signs of infection are present. There is no other open lesions or pre-ulcer lesions identified today.  Vascular: Dorsalis Pedis artery and Posterior Tibial artery pedal pulses are 2/4 bilateral with immedate capillary fill time.  There is no pain with calf compression, swelling, warmth, erythema.   Neruologic: Grossly intact via light touch bilateral.  Protective threshold with Semmes Wienstein monofilament intact to all pedal sites bilateral.   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 Tenderness along the hyperkeratotic lesion to the medial aspect of left second toe. No other areas of significant tenderness identified bilaterally. There is no overlying edema, erythema, increase in warmth bilaterally. Decrease in medial arch upon weightbearing.  Gait: Unassisted, Nonantalgic.      Assessment & Plan:  83 year old male left symptomatic hyperkeratotic lesion left second toe with digital deformities, HAV -Treatment options  discussed including all alternatives, risks, and complications -Etiology of symptoms were discussed -X-rays were obtained and reviewed with the patient. HAV and hammertoes are present. No evidence of acute fracture. I reviewed the x-rays with the patient and his wife. -Hyperkeratotic lesion sharply debrided 1 without any complications or bleeding. -Discussed the change in shoes as well as offloading pads. I dispensed toe separator. -He was interested in correcting this. Discussed with him surgical intervention but we will start with further conservative treatment at this point. -Follow-up of symptoms are not improving with several weeks or sooner if any issues are worsening.  Celesta Gentile, DPM

## 2017-04-28 NOTE — Patient Instructions (Signed)
Look at getting an "extra depth" or a "double depth" shoe. Also stay away from shoes with a stiff material in the toe box to avoid any further pressure to this area.   It was nice to meet you today. Have a good week and stay safe!

## 2017-08-12 ENCOUNTER — Encounter: Payer: Self-pay | Admitting: Emergency Medicine

## 2017-08-12 ENCOUNTER — Inpatient Hospital Stay
Admission: EM | Admit: 2017-08-12 | Discharge: 2017-08-16 | DRG: 481 | Disposition: A | Discharge status: Skilled Nursing Facility | Payer: Medicare Other | Attending: Internal Medicine | Admitting: Internal Medicine

## 2017-08-12 ENCOUNTER — Emergency Department: Payer: Medicare Other

## 2017-08-12 ENCOUNTER — Other Ambulatory Visit: Payer: Self-pay

## 2017-08-12 DIAGNOSIS — E860 Dehydration: Secondary | ICD-10-CM | POA: Diagnosis not present

## 2017-08-12 DIAGNOSIS — Y998 Other external cause status: Secondary | ICD-10-CM | POA: Diagnosis not present

## 2017-08-12 DIAGNOSIS — Z96642 Presence of left artificial hip joint: Secondary | ICD-10-CM | POA: Diagnosis present

## 2017-08-12 DIAGNOSIS — S72001A Fracture of unspecified part of neck of right femur, initial encounter for closed fracture: Secondary | ICD-10-CM

## 2017-08-12 DIAGNOSIS — W010XXA Fall on same level from slipping, tripping and stumbling without subsequent striking against object, initial encounter: Secondary | ICD-10-CM | POA: Diagnosis present

## 2017-08-12 DIAGNOSIS — K5900 Constipation, unspecified: Secondary | ICD-10-CM | POA: Diagnosis not present

## 2017-08-12 DIAGNOSIS — E538 Deficiency of other specified B group vitamins: Secondary | ICD-10-CM | POA: Diagnosis present

## 2017-08-12 DIAGNOSIS — Z86010 Personal history of colonic polyps: Secondary | ICD-10-CM | POA: Diagnosis not present

## 2017-08-12 DIAGNOSIS — I340 Nonrheumatic mitral (valve) insufficiency: Secondary | ICD-10-CM | POA: Diagnosis not present

## 2017-08-12 DIAGNOSIS — I471 Supraventricular tachycardia: Secondary | ICD-10-CM | POA: Diagnosis not present

## 2017-08-12 DIAGNOSIS — Y93K1 Activity, walking an animal: Secondary | ICD-10-CM | POA: Diagnosis not present

## 2017-08-12 DIAGNOSIS — E782 Mixed hyperlipidemia: Secondary | ICD-10-CM | POA: Diagnosis present

## 2017-08-12 DIAGNOSIS — S72009A Fracture of unspecified part of neck of unspecified femur, initial encounter for closed fracture: Secondary | ICD-10-CM | POA: Diagnosis present

## 2017-08-12 DIAGNOSIS — S72141A Displaced intertrochanteric fracture of right femur, initial encounter for closed fracture: Principal | ICD-10-CM | POA: Diagnosis present

## 2017-08-12 DIAGNOSIS — E039 Hypothyroidism, unspecified: Secondary | ICD-10-CM | POA: Diagnosis present

## 2017-08-12 DIAGNOSIS — M810 Age-related osteoporosis without current pathological fracture: Secondary | ICD-10-CM | POA: Diagnosis present

## 2017-08-12 DIAGNOSIS — Z79890 Hormone replacement therapy (postmenopausal): Secondary | ICD-10-CM

## 2017-08-12 DIAGNOSIS — D62 Acute posthemorrhagic anemia: Secondary | ICD-10-CM | POA: Diagnosis not present

## 2017-08-12 DIAGNOSIS — I959 Hypotension, unspecified: Secondary | ICD-10-CM | POA: Diagnosis not present

## 2017-08-12 DIAGNOSIS — Z85828 Personal history of other malignant neoplasm of skin: Secondary | ICD-10-CM | POA: Diagnosis not present

## 2017-08-12 DIAGNOSIS — Z419 Encounter for procedure for purposes other than remedying health state, unspecified: Secondary | ICD-10-CM

## 2017-08-12 DIAGNOSIS — T402X5A Adverse effect of other opioids, initial encounter: Secondary | ICD-10-CM | POA: Diagnosis not present

## 2017-08-12 DIAGNOSIS — R911 Solitary pulmonary nodule: Secondary | ICD-10-CM | POA: Diagnosis present

## 2017-08-12 HISTORY — DX: Hypothyroidism, unspecified: E03.9

## 2017-08-12 LAB — CBC
HCT: 33.9 % — ABNORMAL LOW (ref 40.0–52.0)
HEMOGLOBIN: 12 g/dL — AB (ref 13.0–18.0)
MCH: 34.3 pg — AB (ref 26.0–34.0)
MCHC: 35.5 g/dL (ref 32.0–36.0)
MCV: 96.6 fL (ref 80.0–100.0)
Platelets: 226 10*3/uL (ref 150–440)
RBC: 3.51 MIL/uL — AB (ref 4.40–5.90)
RDW: 23.1 % — ABNORMAL HIGH (ref 11.5–14.5)
WBC: 9.9 10*3/uL (ref 3.8–10.6)

## 2017-08-12 LAB — PROTIME-INR
INR: 1.1
Prothrombin Time: 14.1 seconds (ref 11.4–15.2)

## 2017-08-12 LAB — BASIC METABOLIC PANEL
ANION GAP: 5 (ref 5–15)
BUN: 25 mg/dL — AB (ref 6–20)
CHLORIDE: 104 mmol/L (ref 101–111)
CO2: 30 mmol/L (ref 22–32)
Calcium: 9.2 mg/dL (ref 8.9–10.3)
Creatinine, Ser: 0.98 mg/dL (ref 0.61–1.24)
GFR calc Af Amer: >60 mL/min (ref >60)
GFR calc non Af Amer: >60 mL/min (ref >60)
GLUCOSE: 192 mg/dL — AB (ref 65–99)
POTASSIUM: 4 mmol/L (ref 3.5–5.1)
Sodium: 139 mmol/L (ref 135–145)

## 2017-08-12 LAB — URINALYSIS, ROUTINE W REFLEX MICROSCOPIC
Bacteria, UA: NONE SEEN
Bilirubin Urine: NEGATIVE
Hgb urine dipstick: NEGATIVE
Ketones, ur: NEGATIVE mg/dL
Leukocytes, UA: NEGATIVE
NITRITE: NEGATIVE
Protein, ur: NEGATIVE mg/dL
Specific Gravity, Urine: 1.013 (ref 1.005–1.030)
Squamous Epithelial / LPF: NONE SEEN
WBC UA: NONE SEEN WBC/hpf (ref 0–5)
pH: 7 (ref 5.0–8.0)

## 2017-08-12 LAB — MRSA PCR SCREENING: MRSA BY PCR: NEGATIVE

## 2017-08-12 MED ORDER — CHLORHEXIDINE GLUCONATE 4 % EX LIQD
Freq: Once | CUTANEOUS | Status: AC
  Administered 2017-08-13: 13:00:00 via TOPICAL

## 2017-08-12 MED ORDER — DEXTROSE 5 % IV SOLN
2.0000 g | INTRAVENOUS | Status: AC
  Administered 2017-08-13: 2 g via INTRAVENOUS
  Filled 2017-08-12: qty 2000

## 2017-08-12 MED ORDER — ONDANSETRON HCL 4 MG PO TABS: 4.0000 mg | ORAL_TABLET | Freq: Four times a day (QID) | ORAL | Status: DC | PRN

## 2017-08-12 MED ORDER — MORPHINE SULFATE (PF) 2 MG/ML IV SOLN
2.0000 mg | INTRAVENOUS | Status: DC | PRN
  Administered 2017-08-12 – 2017-08-13 (×3): 2 mg via INTRAVENOUS
  Filled 2017-08-12 (×3): qty 1

## 2017-08-12 MED ORDER — CEFAZOLIN SODIUM-DEXTROSE 2-4 GM/100ML-% IV SOLN: 2.0000 g | INTRAVENOUS | Status: DC

## 2017-08-12 MED ORDER — POLYETHYLENE GLYCOL 3350 17 G PO PACK: 17.0000 g | PACK | Freq: Every day | ORAL | Status: DC | PRN

## 2017-08-12 MED ORDER — FOLIC ACID 1 MG PO TABS: 1.0000 mg | ORAL_TABLET | Freq: Every day | ORAL | Status: DC

## 2017-08-12 MED ORDER — MORPHINE SULFATE (PF) 4 MG/ML IV SOLN
4.0000 mg | Freq: Once | INTRAVENOUS | Status: AC
  Administered 2017-08-12: 4 mg via INTRAVENOUS
  Filled 2017-08-12: qty 1

## 2017-08-12 MED ORDER — LEVOTHYROXINE SODIUM 112 MCG PO TABS
112.0000 ug | ORAL_TABLET | Freq: Every day | ORAL | Status: DC
  Filled 2017-08-12: qty 1

## 2017-08-12 MED ORDER — CALCIUM CARBONATE-VITAMIN D 500-200 MG-UNIT PO TABS
1.0000 | ORAL_TABLET | Freq: Two times a day (BID) | ORAL | Status: DC
  Administered 2017-08-12: 1 via ORAL
  Filled 2017-08-12: qty 1

## 2017-08-12 MED ORDER — SODIUM CHLORIDE 0.9 % IV SOLN
INTRAVENOUS | Status: DC
  Administered 2017-08-12 – 2017-08-13 (×2): via INTRAVENOUS

## 2017-08-12 MED ORDER — VITAMIN B-6 50 MG PO TABS
25.0000 mg | ORAL_TABLET | Freq: Every day | ORAL | Status: DC
  Filled 2017-08-12: qty 1
  Filled 2017-08-12: qty 0.5

## 2017-08-12 MED ORDER — CLINDAMYCIN PHOSPHATE 600 MG/50ML IV SOLN
600.0000 mg | INTRAVENOUS | Status: AC
  Administered 2017-08-13: 600 mg via INTRAVENOUS
  Filled 2017-08-12: qty 50

## 2017-08-12 MED ORDER — TRAMADOL HCL 50 MG PO TABS
50.0000 mg | ORAL_TABLET | Freq: Four times a day (QID) | ORAL | Status: DC | PRN
  Administered 2017-08-12: 50 mg via ORAL
  Filled 2017-08-12: qty 1

## 2017-08-12 MED ORDER — VITAMIN B-12 1000 MCG PO TABS: 1000.0000 ug | ORAL_TABLET | Freq: Every day | ORAL | Status: DC

## 2017-08-12 MED ORDER — ONDANSETRON HCL 4 MG/2ML IJ SOLN: 4.0000 mg | Freq: Four times a day (QID) | INTRAMUSCULAR | Status: DC | PRN

## 2017-08-12 MED ORDER — FOLIC ACID-VIT B6-VIT B12 2.5-25-1 MG PO TABS: 1.0000 | ORAL_TABLET | Freq: Every day | ORAL | Status: DC

## 2017-08-12 MED ORDER — ONDANSETRON HCL 4 MG/2ML IJ SOLN
4.0000 mg | Freq: Once | INTRAMUSCULAR | Status: AC
  Administered 2017-08-12: 4 mg via INTRAVENOUS
  Filled 2017-08-12: qty 2

## 2017-08-12 MED ORDER — ACETAMINOPHEN 650 MG RE SUPP: 650.0000 mg | Freq: Four times a day (QID) | RECTAL | Status: DC | PRN

## 2017-08-12 MED ORDER — HYDROMORPHONE HCL 1 MG/ML IJ SOLN
0.5000 mg | INTRAMUSCULAR | Status: DC | PRN
  Administered 2017-08-12: 0.5 mg via INTRAVENOUS
  Filled 2017-08-12: qty 1

## 2017-08-12 MED ORDER — ACETAMINOPHEN 325 MG PO TABS: 650.0000 mg | ORAL_TABLET | Freq: Four times a day (QID) | ORAL | Status: DC | PRN

## 2017-08-12 MED ORDER — MUPIROCIN 2 % EX OINT
1.0000 "application " | TOPICAL_OINTMENT | Freq: Two times a day (BID) | CUTANEOUS | Status: DC
  Filled 2017-08-12: qty 22

## 2017-08-12 MED ORDER — CHLORHEXIDINE GLUCONATE CLOTH 2 % EX PADS: 6.0000 | MEDICATED_PAD | Freq: Every day | CUTANEOUS | Status: DC

## 2017-08-12 MED ORDER — BISACODYL 5 MG PO TBEC: 5.0000 mg | DELAYED_RELEASE_TABLET | Freq: Every day | ORAL | Status: DC | PRN

## 2017-08-12 NOTE — ED Notes (Signed)
Receiving RN unable to take report at this time. Receiving RN to call back when available.

## 2017-08-12 NOTE — ED Triage Notes (Signed)
Pt arrived via EMS from local vet office. Pt pulled down by his dog and landed onto his right hip. Pt has history of left hip replacement. EMS reports no shortening or rotation noted. EMS reports VSS.

## 2017-08-12 NOTE — ED Provider Notes (Signed)
[facility] Emergency Department Provider Note  Time seen: 3:13 PM  I have reviewed the triage vital signs and the nursing notes.   HISTORY  Chief Complaint Hip Pain    HPI Eric Larsen is a 83 y.o. male with a past medical history of hypothyroidism, left hip replacement 2 years ago, presents to the emergency department after a fall.  According to the patient tripped while being pulled forward by his dog.  States he landed on his right side.  Denies hitting his head.  Denies LOC nausea or vomiting.  Patient states immediate pain to the right hip unable to bear weight.  Patient denies any other medical complaints today.  Negative review of systems.  Currently describes his pain as moderate, severe with any attempted movement.  Past Medical History:  Diagnosis Date  . Osteoporosis   . Thyroid disease     Patient Active Problem List   Diagnosis Date Noted  . Hallux valgus of left foot 04/28/2017  . Hammer toe of left foot 04/28/2017  . Corn of toe 04/28/2017  . History of total left hip replacement 09/19/2015  . Spherocytosis (familial) ([place]) 09/17/2015  . Mixed hyperlipidemia 06/19/2015  . Benign prostatic hyperplasia with lower urinary tract symptoms 06/19/2015  . History of fracture of vertebra 11/06/2014  . History of colon polyps 08/08/2014  . Pathological fracture 11/01/2013  . Toenail fungus 01/20/2013  . Vitamin D deficiency 09/27/2012  . Primary hypothyroidism 09/27/2012  . Osteoporosis 09/27/2012  . Spherocytosis ([place]) 12/04/2011  . Skin cancer 12/04/2011    Past Surgical History:  Procedure Laterality Date  . HIP FRACTURE SURGERY    . SHOULDER SURGERY      Prior to Admission medications   Medication Sig Start Date End Date Taking? Authorizing Provider  Folic Acid-Vit E9-BMW U13 (FOLBEE) 2.5-25-1 MG TABS tablet Take 1 tablet by mouth daily.    [provider]  levothyroxine (SYNTHROID, LEVOTHROID) 112 MCG tablet Take 112  mcg by mouth daily before breakfast.    [provider]    No Known Allergies  No family history on file.  Social History Social History   Tobacco Use  . Smoking status: Never Smoker  . Smokeless tobacco: Never Used  Substance Use Topics  . Alcohol use: Yes    Comment: social  . Drug use: No    Review of Systems Constitutional: Negative for fever.  Negative for LOC Eyes: Negative for visual changes. ENT: Negative for congestion Cardiovascular: Negative for chest pain. Respiratory: Negative for shortness of breath. Gastrointestinal: Negative for abdominal pain, vomiting Genitourinary: Negative for dysuria. Musculoskeletal: Right hip pain Skin: Small abrasion/skin tear to right elbow Neurological: Negative for headaches, focal weakness or numbness. All other ROS negative  ____________________________________________   PHYSICAL EXAM:  VITAL SIGNS: ED Triage Vitals  Enc Vitals Group     BP 08/12/17 1448 (!) 136/55     Pulse Rate 08/12/17 1448 67     Resp 08/12/17 1448 18     Temp 08/12/17 1448 97.8 F (36.6 C)     Temp Source 08/12/17 1448 Oral     SpO2 08/12/17 1448 100 %     Weight 08/12/17 1447 130 lb (59 kg)     Height 08/12/17 1447 5\' 7"  (1.702 m)     Head Circumference --      Peak Flow --      Pain Score 08/12/17 1445 10     Pain Loc --  Pain Edu? --      Excl. in [place]? --    Constitutional: Alert and oriented. Well appearing and in no distress. Eyes: Normal exam ENT   Head: Normocephalic and atraumatic.   Mouth/Throat: Mucous membranes are moist. Cardiovascular: Normal rate, regular rhythm. No murmur Respiratory: Normal respiratory effort without tachypnea nor retractions. Breath sounds are clear  Gastrointestinal: Soft and nontender. No distention.   Musculoskeletal: Nontender with normal range of motion in all extremities. Neurologic:  Normal speech and language. No gross focal neurologic deficits Skin:  Skin is warm, dry and  intact.  Psychiatric: Mood and affect are normal.  ____________________________________________    EKG  EKG reviewed and interpreted by myself shows sinus rhythm at 68 bpm with a narrow QRS, normal axis, normal intervals, nonspecific but no concerning ST changes  ____________________________________________    RADIOLOGY  Right intertrochanteric fracture. Chest x-ray negative  ____________________________________________   INITIAL IMPRESSION / ASSESSMENT AND PLAN / ED COURSE  Pertinent labs & imaging results that were available during my care of the patient were reviewed by me and considered in my medical decision making (see chart for details).  Patient presents to the emergency department after a fall with right hip pain.  Differential would include contusions, fracture, dislocation.  X-ray consistent with fracture.  Will obtain chest x-ray EKG and labs for preop purposes.  Overall the patient is very healthy at baseline only takes Synthroid, no blood thinners.  Will discuss with orthopedics and admit to the hospitalist service.  X-ray consistent with right intertrochanteric fracture.  Discussed patient with Dr. Sabra Heck who will operate tomorrow.  ____________________________________________   FINAL CLINICAL IMPRESSION(S) / ED DIAGNOSES  Right hip fracture    Harvest Dark, MD 08/12/17 1539

## 2017-08-12 NOTE — H&P (Signed)
[facility] [facility] [place] NAME: Eric Larsen    MR#:  [id number]  DATE OF BIRTH:  May 13, 1931  DATE OF ADMISSION:  08/12/2017  PRIMARY CARE PHYSICIAN: Mickel Crow, Gifford Shave, MD   REQUESTING/REFERRING PHYSICIAN:dr Paduchowski  CHIEF COMPLAINT:   Hip pain HISTORY OF PRESENT ILLNESS:  Eric Larsen  is a 83 y.o. male with a known history of hypothyroidism who presents with above complaint. Patient reports that he was walking his dog and the dog tugged on his leash and the patient felt on his hip. He was unable to get up without severe pain. He is brought to the ER for further evaluation.  X-ray shows Slightly displaced minimally comminuted intertrochanteric fracture of the proximal right femur.  Orthopedic surgery has been contacted by ED physician. Plan for surgery tomorrow  PAST MEDICAL HISTORY:   Past Medical History:  Diagnosis Date  . Osteoporosis   . Thyroid disease     PAST SURGICAL HISTORY:   Past Surgical History:  Procedure Laterality Date  . HIP FRACTURE SURGERY    . SHOULDER SURGERY      SOCIAL HISTORY:   Social History   Tobacco Use  . Smoking status: Never Smoker  . Smokeless tobacco: Never Used  Substance Use Topics  . Alcohol use: Yes    Comment: social    FAMILY HISTORY:  No family history on file.  DRUG ALLERGIES:  No Known Allergies  REVIEW OF SYSTEMS:   Review of Systems  Constitutional: Negative.  Negative for chills, fever and malaise/fatigue.  HENT: Negative.  Negative for ear discharge, ear pain, hearing loss, nosebleeds and sore throat.   Eyes: Negative.  Negative for blurred vision and pain.  Respiratory: Negative.  Negative for cough, hemoptysis, shortness of breath and wheezing.   Cardiovascular: Negative.  Negative for chest pain, palpitations and leg swelling.  Gastrointestinal: Negative.  Negative for abdominal pain, blood in stool, diarrhea, nausea and vomiting.   Genitourinary: Negative.  Negative for dysuria.  Musculoskeletal: Negative.  Negative for back pain.  Skin: Negative.   Neurological: Negative for dizziness, tremors, speech change, focal weakness, seizures and headaches.  Endo/Heme/Allergies: Negative.  Does not bruise/bleed easily.  Psychiatric/Behavioral: Negative.  Negative for depression, hallucinations and suicidal ideas.    MEDICATIONS AT HOME:   Prior to Admission medications   Medication Sig Start Date End Date Taking? Authorizing Provider  Folic Acid-Vit B3-ALP F79 (FOLBEE) 2.5-25-1 MG TABS tablet Take 1 tablet by mouth daily.    [provider]  levothyroxine (SYNTHROID, LEVOTHROID) 112 MCG tablet Take 112 mcg by mouth daily before breakfast.    [provider]      VITAL SIGNS:  Blood pressure (!) 136/55, pulse 67, temperature 97.8 F (36.6 C), temperature source Oral, resp. rate 18, height 5\' 7"  (1.702 m), weight 59 kg (130 lb), SpO2 100 %.  PHYSICAL EXAMINATION:   Physical Exam  Constitutional: He is oriented to person, place, and time and well-developed, well-nourished, and in no distress. No distress.  HENT:  Head: Normocephalic.  Eyes: No scleral icterus.  Neck: Normal range of motion. Neck supple. No JVD present. No tracheal deviation present.  Cardiovascular: Normal rate, regular rhythm and normal heart sounds. Exam reveals no gallop and no friction rub.  No murmur heard. Pulmonary/Chest: Effort normal and breath sounds normal. No respiratory distress. He has no wheezes. He has no rales. He exhibits no tenderness.  Abdominal: Soft. Bowel sounds are normal. He exhibits no distension and no  mass. There is no tenderness. There is no rebound and no guarding.  Musculoskeletal: He exhibits tenderness. He exhibits no edema.  Right leg is shorter than left  Neurological: He is alert and oriented to person, place, and time.  Skin: Skin is warm. No rash noted. No erythema.  Psychiatric: Affect and  judgment normal.      LABORATORY PANEL:   CBC Recent Labs  Lab 08/12/17 1521  WBC 9.9  HGB 12.0*  HCT 33.9*  PLT 226   ------------------------------------------------------------------------------------------------------------------  Chemistries  No results for input(s): NA, K, CL, CO2, GLUCOSE, BUN, CREATININE, CALCIUM, MG, AST, ALT, ALKPHOS, BILITOT in the last 168 hours.  Invalid input(s): GFRCGP ------------------------------------------------------------------------------------------------------------------  Cardiac Enzymes No results for input(s): TROPONINI in the last 168 hours. ------------------------------------------------------------------------------------------------------------------  RADIOLOGY:  Dg Chest 1 View  Result Date: 08/12/2017 CLINICAL DATA:  Acute right hip fracture. Pre operative respiratory exam. EXAM: CHEST 1 VIEW COMPARISON:  02/03/2017 FINDINGS: Heart size and pulmonary vascularity are normal and the lungs are clear. Aortic atherosclerosis. Multiple old left rib fractures. Bold proximal left humerus fracture IMPRESSION: No acute abnormalities. Aortic atherosclerosis. Electronically Signed   By: Lorriane Shire M.D.   On: 08/12/2017 15:21   Dg Hip Unilat W Or Wo Pelvis 2-3 Views Right  Result Date: 08/12/2017 CLINICAL DATA:  Right hip pain after a fall today at the veterinarian's office. EXAM: DG HIP (WITH OR WITHOUT PELVIS) 2-3V RIGHT COMPARISON:  Pelvis radiograph dated 02/03/2017 FINDINGS: There is a slightly displaced minimally comminuted intertrochanteric fracture of the proximal right femur. Pelvic bones are intact. Benign bone island in the right ilium. Left total hip prosthesis. IMPRESSION: Slightly displaced minimally comminuted intertrochanteric fracture of the proximal right femur. Osteopenia. Electronically Signed   By: Lorriane Shire M.D.   On: 08/12/2017 15:20    EKG:  Normal sinus rhythm no ST elevation or depression  IMPRESSION  AND PLAN:   83 year old male with hypothyroidism who presents after mechanical fall.  1. Slightly displaced minimally comminuted intertrochanteric fracture of the proximal right femur: Patient is low risk for moderate risk procedure may proceed to surgery without further cardiac intervention. DVT prophylaxis as per orthopedic surgery. Continue pain medications and PT consultation after surgery.  2. Hypothyroid: Continue Synthroid  3. Osteoporosis: Continue calcium with vitamin D    All the records are reviewed and case discussed with ED provider. Management plans discussed with the patient and he is in agreement  CODE STATUS: FULL  TOTAL TIME TAKING CARE OF THIS PATIENT: 42 minutes.    Korben Carcione M.D on 08/12/2017 at 3:47 PM  Between 7am to 6pm - Pager - [phone]  After 6pm go to [email or web address] - password [email or web address] [facility]  Office  [phone]  CC: Primary care physician; [name], MD

## 2017-08-13 ENCOUNTER — Inpatient Hospital Stay: Payer: Medicare Other

## 2017-08-13 ENCOUNTER — Inpatient Hospital Stay: Payer: Medicare Other | Admitting: Certified Registered Nurse Anesthetist

## 2017-08-13 ENCOUNTER — Encounter
Admission: EM | Disposition: A | Discharge status: Skilled Nursing Facility | Payer: Self-pay | Source: Home / Self Care | Attending: Internal Medicine

## 2017-08-13 ENCOUNTER — Encounter: Payer: Self-pay | Admitting: *Deleted

## 2017-08-13 HISTORY — PX: INTRAMEDULLARY (IM) NAIL INTERTROCHANTERIC: SHX5875

## 2017-08-13 LAB — BASIC METABOLIC PANEL
ANION GAP: 4 — AB (ref 5–15)
BUN: 21 mg/dL — ABNORMAL HIGH (ref 6–20)
CALCIUM: 9 mg/dL (ref 8.9–10.3)
CO2: 29 mmol/L (ref 22–32)
Chloride: 105 mmol/L (ref 101–111)
Creatinine, Ser: 0.96 mg/dL (ref 0.61–1.24)
GFR calc Af Amer: >60 mL/min (ref >60)
GFR calc non Af Amer: >60 mL/min (ref >60)
GLUCOSE: 142 mg/dL — AB (ref 65–99)
POTASSIUM: 4.2 mmol/L (ref 3.5–5.1)
Sodium: 138 mmol/L (ref 135–145)

## 2017-08-13 LAB — CBC
HEMATOCRIT: 28.3 % — AB (ref 40.0–52.0)
HEMATOCRIT: 27 % — AB (ref 40.0–52.0)
Hemoglobin: 10 g/dL — ABNORMAL LOW (ref 13.0–18.0)
Hemoglobin: 9.5 g/dL — ABNORMAL LOW (ref 13.0–18.0)
MCH: 33.9 pg (ref 26.0–34.0)
MCH: 33.4 pg (ref 26.0–34.0)
MCHC: 35.4 g/dL (ref 32.0–36.0)
MCHC: 35.1 g/dL (ref 32.0–36.0)
MCV: 95.8 fL (ref 80.0–100.0)
MCV: 95.2 fL (ref 80.0–100.0)
Platelets: 194 10*3/uL (ref 150–440)
Platelets: 179 10*3/uL (ref 150–440)
RBC: 2.96 MIL/uL — ABNORMAL LOW (ref 4.40–5.90)
RBC: 2.84 MIL/uL — AB (ref 4.40–5.90)
RDW: 23.2 % — AB (ref 11.5–14.5)
RDW: 23.2 % — AB (ref 11.5–14.5)
WBC: 12 10*3/uL — AB (ref 3.8–10.6)
WBC: 15.8 10*3/uL — AB (ref 3.8–10.6)

## 2017-08-13 SURGERY — FIXATION, FRACTURE, INTERTROCHANTERIC, WITH INTRAMEDULLARY ROD
Anesthesia: Spinal | Site: Hip | Laterality: Right | Wound class: Clean

## 2017-08-13 MED ORDER — METOCLOPRAMIDE HCL 10 MG PO TABS: 5.0000 mg | ORAL_TABLET | Freq: Three times a day (TID) | ORAL | Status: DC | PRN

## 2017-08-13 MED ORDER — ALUM & MAG HYDROXIDE-SIMETH 200-200-20 MG/5ML PO SUSP
30.0000 mL | ORAL | Status: DC | PRN
  Administered 2017-08-15: 30 mL via ORAL
  Filled 2017-08-13: qty 30

## 2017-08-13 MED ORDER — DEXTROSE 5 % IV SOLN
2.0000 g | Freq: Three times a day (TID) | INTRAVENOUS | Status: AC
  Administered 2017-08-14 (×3): 2 g via INTRAVENOUS
  Filled 2017-08-13 (×3): qty 2000

## 2017-08-13 MED ORDER — ZOLPIDEM TARTRATE 5 MG PO TABS: 5.0000 mg | ORAL_TABLET | Freq: Every evening | ORAL | Status: DC | PRN

## 2017-08-13 MED ORDER — CEFAZOLIN SODIUM-DEXTROSE 2-4 GM/100ML-% IV SOLN: 2.0000 g | Freq: Three times a day (TID) | INTRAVENOUS | Status: DC

## 2017-08-13 MED ORDER — ONDANSETRON HCL 4 MG/2ML IJ SOLN: 4.0000 mg | Freq: Four times a day (QID) | INTRAMUSCULAR | Status: DC | PRN

## 2017-08-13 MED ORDER — LIDOCAINE HCL (PF) 2 % IJ SOLN
INTRAMUSCULAR | Status: DC | PRN
  Administered 2017-08-13: 50 mg

## 2017-08-13 MED ORDER — SENNA 8.6 MG PO TABS
1.0000 | ORAL_TABLET | Freq: Two times a day (BID) | ORAL | Status: DC
  Administered 2017-08-13 – 2017-08-15 (×4): 8.6 mg via ORAL
  Filled 2017-08-13 (×5): qty 1

## 2017-08-13 MED ORDER — PHENYLEPHRINE HCL 10 MG/ML IJ SOLN
INTRAMUSCULAR | Status: AC
  Filled 2017-08-13: qty 1

## 2017-08-13 MED ORDER — FENTANYL CITRATE (PF) 100 MCG/2ML IJ SOLN
INTRAMUSCULAR | Status: AC
  Filled 2017-08-13: qty 2

## 2017-08-13 MED ORDER — ONDANSETRON HCL 4 MG PO TABS: 4.0000 mg | ORAL_TABLET | Freq: Four times a day (QID) | ORAL | Status: DC | PRN

## 2017-08-13 MED ORDER — MENTHOL 3 MG MT LOZG
1.0000 | LOZENGE | OROMUCOSAL | Status: DC | PRN
  Filled 2017-08-13: qty 9

## 2017-08-13 MED ORDER — DILTIAZEM HCL 25 MG/5ML IV SOLN
10.0000 mg | Freq: Once | INTRAVENOUS | Status: AC
  Administered 2017-08-13: 10 mg via INTRAVENOUS
  Filled 2017-08-13: qty 5

## 2017-08-13 MED ORDER — MORPHINE SULFATE (PF) 2 MG/ML IV SOLN
1.0000 mg | INTRAVENOUS | Status: DC | PRN
  Administered 2017-08-14: 1 mg via INTRAVENOUS
  Filled 2017-08-13: qty 1

## 2017-08-13 MED ORDER — METOPROLOL TARTRATE 5 MG/5ML IV SOLN: 5.0000 mg | INTRAVENOUS | Status: DC | PRN

## 2017-08-13 MED ORDER — ENOXAPARIN SODIUM 30 MG/0.3ML ~~LOC~~ SOLN
30.0000 mg | SUBCUTANEOUS | Status: DC
  Administered 2017-08-14: 30 mg via SUBCUTANEOUS
  Filled 2017-08-13: qty 0.3

## 2017-08-13 MED ORDER — CLINDAMYCIN PHOSPHATE 600 MG/50ML IV SOLN
600.0000 mg | Freq: Three times a day (TID) | INTRAVENOUS | Status: AC
  Administered 2017-08-13 – 2017-08-14 (×3): 600 mg via INTRAVENOUS
  Filled 2017-08-13 (×3): qty 50

## 2017-08-13 MED ORDER — PHENOL 1.4 % MT LIQD
1.0000 | OROMUCOSAL | Status: DC | PRN
  Filled 2017-08-13: qty 177

## 2017-08-13 MED ORDER — LEVOTHYROXINE SODIUM 112 MCG PO TABS
112.0000 ug | ORAL_TABLET | Freq: Every day | ORAL | Status: DC
  Administered 2017-08-13 – 2017-08-16 (×3): 112 ug via ORAL
  Filled 2017-08-13 (×3): qty 1

## 2017-08-13 MED ORDER — SODIUM CHLORIDE 0.9 % IV SOLN
INTRAVENOUS | Status: DC | PRN
Start: 2017-08-13 — End: 2017-08-13
  Administered 2017-08-13: 20 ug/min via INTRAVENOUS

## 2017-08-13 MED ORDER — METOCLOPRAMIDE HCL 5 MG/ML IJ SOLN: 5.0000 mg | Freq: Three times a day (TID) | INTRAMUSCULAR | Status: DC | PRN

## 2017-08-13 MED ORDER — PROPOFOL 500 MG/50ML IV EMUL
INTRAVENOUS | Status: DC | PRN
  Administered 2017-08-13: 25 ug/kg/min via INTRAVENOUS

## 2017-08-13 MED ORDER — KETAMINE HCL 50 MG/ML IJ SOLN
INTRAMUSCULAR | Status: DC | PRN
  Administered 2017-08-13: 25 mg via INTRAVENOUS

## 2017-08-13 MED ORDER — PROPOFOL 10 MG/ML IV BOLUS
INTRAVENOUS | Status: AC
  Filled 2017-08-13: qty 20

## 2017-08-13 MED ORDER — FLEET ENEMA 7-19 GM/118ML RE ENEM: 1.0000 | ENEMA | Freq: Once | RECTAL | Status: DC | PRN

## 2017-08-13 MED ORDER — NEOMYCIN-POLYMYXIN B GU 40-200000 IR SOLN
Status: DC | PRN
  Administered 2017-08-13: 2 mL

## 2017-08-13 MED ORDER — KETAMINE HCL 50 MG/ML IJ SOLN
INTRAMUSCULAR | Status: AC
  Filled 2017-08-13: qty 10

## 2017-08-13 MED ORDER — SODIUM CHLORIDE 0.9 % IV BOLUS (SEPSIS)
500.0000 mL | Freq: Once | INTRAVENOUS | Status: AC
  Administered 2017-08-13: 500 mL via INTRAVENOUS

## 2017-08-13 MED ORDER — BUPIVACAINE HCL (PF) 0.5 % IJ SOLN
INTRAMUSCULAR | Status: AC
  Filled 2017-08-13: qty 10

## 2017-08-13 MED ORDER — ACETAMINOPHEN 650 MG RE SUPP: 650.0000 mg | Freq: Four times a day (QID) | RECTAL | Status: DC | PRN

## 2017-08-13 MED ORDER — BUPIVACAINE-EPINEPHRINE (PF) 0.25% -1:200000 IJ SOLN
INTRAMUSCULAR | Status: DC | PRN
  Administered 2017-08-13: 30 mL

## 2017-08-13 MED ORDER — FERROUS SULFATE 325 (65 FE) MG PO TABS
325.0000 mg | ORAL_TABLET | Freq: Every day | ORAL | Status: DC
  Administered 2017-08-14 – 2017-08-16 (×3): 325 mg via ORAL
  Filled 2017-08-13 (×3): qty 1

## 2017-08-13 MED ORDER — FENTANYL CITRATE (PF) 100 MCG/2ML IJ SOLN
INTRAMUSCULAR | Status: DC | PRN
Start: 2017-08-13 — End: 2017-08-13
  Administered 2017-08-13: 25 ug via INTRAVENOUS
  Administered 2017-08-13: 50 ug via INTRAVENOUS
  Administered 2017-08-13: 25 ug via INTRAVENOUS

## 2017-08-13 MED ORDER — BISACODYL 10 MG RE SUPP
10.0000 mg | Freq: Every day | RECTAL | Status: DC | PRN
  Administered 2017-08-15: 10 mg via RECTAL
  Filled 2017-08-13: qty 1

## 2017-08-13 MED ORDER — BUPIVACAINE HCL (PF) 0.5 % IJ SOLN
INTRAMUSCULAR | Status: DC | PRN
  Administered 2017-08-13: 3 mL via INTRATHECAL

## 2017-08-13 MED ORDER — PROPOFOL 10 MG/ML IV BOLUS
INTRAVENOUS | Status: DC | PRN
  Administered 2017-08-13: 20 mg via INTRAVENOUS

## 2017-08-13 MED ORDER — HYDROCODONE-ACETAMINOPHEN 5-325 MG PO TABS
1.0000 | ORAL_TABLET | Freq: Four times a day (QID) | ORAL | Status: DC | PRN
  Administered 2017-08-13: 1 via ORAL
  Filled 2017-08-13: qty 1

## 2017-08-13 MED ORDER — ACETAMINOPHEN 325 MG PO TABS
650.0000 mg | ORAL_TABLET | Freq: Four times a day (QID) | ORAL | Status: DC | PRN
  Administered 2017-08-14: 650 mg via ORAL
  Filled 2017-08-13 (×2): qty 2

## 2017-08-13 MED ORDER — SODIUM CHLORIDE 0.45 % IV SOLN
INTRAVENOUS | Status: DC
  Administered 2017-08-13 – 2017-08-14 (×2): via INTRAVENOUS

## 2017-08-13 MED ORDER — SILVER NITRATE-POT NITRATE 75-25 % EX MISC
CUTANEOUS | Status: AC
  Filled 2017-08-13: qty 1

## 2017-08-13 MED ORDER — EPHEDRINE SULFATE 50 MG/ML IJ SOLN
INTRAMUSCULAR | Status: DC | PRN
  Administered 2017-08-13 (×2): 10 mg via INTRAVENOUS

## 2017-08-13 MED ORDER — MAGNESIUM HYDROXIDE 400 MG/5ML PO SUSP
30.0000 mL | Freq: Every day | ORAL | Status: DC | PRN
  Administered 2017-08-14: 30 mL via ORAL
  Filled 2017-08-13: qty 30

## 2017-08-13 MED ORDER — LACTATED RINGERS IV SOLN
INTRAVENOUS | Status: DC | PRN
  Administered 2017-08-13: 16:00:00 via INTRAVENOUS

## 2017-08-13 SURGICAL SUPPLY — 34 items
BLADE HELICAL 95 (Orthopedic Implant) ×2 IMPLANT
BLADE HELICAL 95MM (Orthopedic Implant) ×1 IMPLANT
BNDG COHESIVE 4X5 TAN STRL (GAUZE/BANDAGES/DRESSINGS) ×3 IMPLANT
CANISTER SUCT 1200ML W/VALVE (MISCELLANEOUS) ×3 IMPLANT
CHLORAPREP W/TINT 26ML (MISCELLANEOUS) ×6 IMPLANT
DRAPE C-ARMOR (DRAPES) IMPLANT
DRAPE INCISE 23X17 IOBAN STRL (DRAPES) ×2
DRAPE INCISE IOBAN 23X17 STRL (DRAPES) ×1 IMPLANT
DRSG AQUACEL AG ADV 3.5X10 (GAUZE/BANDAGES/DRESSINGS) IMPLANT
DRSG AQUACEL AG ADV 3.5X14 (GAUZE/BANDAGES/DRESSINGS) IMPLANT
ELECT REM PT RETURN 9FT ADLT (ELECTROSURGICAL) ×3
ELECTRODE REM PT RTRN 9FT ADLT (ELECTROSURGICAL) ×1 IMPLANT
GAUZE PETRO XEROFOAM 1X8 (MISCELLANEOUS) IMPLANT
GAUZE SPONGE 4X4 12PLY STRL (GAUZE/BANDAGES/DRESSINGS) ×3 IMPLANT
GLOVE INDICATOR 8.0 STRL GRN (GLOVE) ×3 IMPLANT
GLOVE SURG ORTHO 8.5 STRL (GLOVE) ×3 IMPLANT
GOWN STRL REUS W/ TWL LRG LVL3 (GOWN DISPOSABLE) ×1 IMPLANT
GOWN STRL REUS W/TWL LRG LVL3 (GOWN DISPOSABLE) ×2
GOWN STRL REUS W/TWL LRG LVL4 (GOWN DISPOSABLE) ×3 IMPLANT
KIT RM TURNOVER STRD PROC AR (KITS) ×3 IMPLANT
MAT BLUE FLOOR 46X72 FLO (MISCELLANEOUS) ×3 IMPLANT
NAIL TROCH FX 11/130D 170-S (Nail) ×3 IMPLANT
NEEDLE SPNL 18GX3.5 QUINCKE PK (NEEDLE) ×3 IMPLANT
NS IRRIG 500ML POUR BTL (IV SOLUTION) ×3 IMPLANT
PACK HIP COMPR (MISCELLANEOUS) ×3 IMPLANT
REAMER ROD DEEP FLUTE 2.5X950 (INSTRUMENTS) ×3 IMPLANT
SCREW LOCK TI 5.0X38 F/IM NAIL (Screw) ×3 IMPLANT
STAPLER SKIN PROX 35W (STAPLE) ×3 IMPLANT
SUCTION FRAZIER HANDLE 10FR (MISCELLANEOUS) ×2
SUCTION TUBE FRAZIER 10FR DISP (MISCELLANEOUS) ×1 IMPLANT
SUT VIC AB 0 CT1 36 (SUTURE) ×3 IMPLANT
SUT VIC AB 2-0 CT1 27 (SUTURE) ×2
SUT VIC AB 2-0 CT1 TAPERPNT 27 (SUTURE) ×1 IMPLANT
SYR 30ML LL (SYRINGE) ×3 IMPLANT

## 2017-08-13 NOTE — Anesthesia Preprocedure Evaluation (Signed)
Anesthesia Evaluation  Patient identified by MRN, date of birth, ID band Patient awake    Reviewed: Allergy & Precautions, H&P , NPO status , Patient's Chart, lab work & pertinent test results  History of Anesthesia Complications Negative for: history of anesthetic complications  Airway Mallampati: III  TM Distance: <3 FB Neck ROM: limited    Dental  (+) Chipped, Poor Dentition, Missing   Pulmonary neg pulmonary ROS, neg shortness of breath,           Cardiovascular Exercise Tolerance: Good (-) angina(-) Past MI and (-) DOE negative cardio ROS       Neuro/Psych negative neurological ROS  negative psych ROS   GI/Hepatic negative GI ROS, Neg liver ROS,   Endo/Other  Hypothyroidism   Renal/GU CRF     Musculoskeletal   Abdominal   Peds  Hematology negative hematology ROS (+)   Anesthesia Other Findings Past Medical History: No date: Hypothyroidism No date: Osteoporosis No date: Thyroid disease  Past Surgical History: No date: HIP FRACTURE SURGERY No date: SHOULDER SURGERY  BMI    Body Mass Index:  20.09 kg/m      Reproductive/Obstetrics negative OB ROS                             Anesthesia Physical Anesthesia Plan  ASA: III  Anesthesia Plan: Spinal   Post-op Pain Management:    Induction:   PONV Risk Score and Plan: Midazolam and Propofol infusion  Airway Management Planned: Natural Airway and Nasal Cannula  Additional Equipment:   Intra-op Plan:   Post-operative Plan:   Informed Consent: I have reviewed the patients History and Physical, chart, labs and discussed the procedure including the risks, benefits and alternatives for the proposed anesthesia with the patient or authorized representative who has indicated his/her understanding and acceptance.   Dental Advisory Given  Plan Discussed with: Anesthesiologist, CRNA and Surgeon  Anesthesia Plan Comments:  (Patient reports no bleeding problems and no anticoagulant use.  Plan for spinal with backup GA  Patient consented for risks of anesthesia including but not limited to:  - adverse reactions to medications - risk of bleeding, infection, nerve damage and headache - risk of failed spinal - damage to teeth, lips or other oral mucosa - sore throat or hoarseness - Damage to heart, brain, lungs or loss of life  Patient voiced understanding.)        Anesthesia Quick Evaluation

## 2017-08-13 NOTE — Anesthesia Procedure Notes (Signed)
Spinal  Patient location during procedure: OR Staffing Anesthesiologist: Piscitello, Joseph K, MD Resident/CRNA: Anaika Santillano, CRNA Performed: resident/CRNA  Preanesthetic Checklist Completed: patient identified, site marked, surgical consent, pre-op evaluation, timeout performed, IV checked, risks and benefits discussed and monitors and equipment checked Spinal Block Patient position: right lateral decubitus Prep: ChloraPrep and site prepped and draped Patient monitoring: heart rate, continuous pulse ox, blood pressure and cardiac monitor Approach: midline Location: L4-5 Injection technique: single-shot Needle Needle type: Introducer and Pencan  Needle gauge: 24 G Needle length: 9 cm Additional Notes Negative paresthesia. Negative blood return. Positive free-flowing CSF. Expiration date of kit checked and confirmed. Patient tolerated procedure well, without complications.       

## 2017-08-13 NOTE — Op Note (Signed)
DATE OF SURGERY:  08/13/2017  TIME: 5:33 PM  PATIENT NAME:  Eric Larsen  AGE: 83 y.o.  PRE-OPERATIVE DIAGNOSIS:  RIGHT hip FRACTURE displaced intertrochanteric  POST-OPERATIVE DIAGNOSIS:  SAME  PROCEDURE:  INTRAMEDULLARY (IM) NAIL INTERTROCHANTERIC right hip  SURGEON:  Parthenia Tellefsen E  EBL: 50 cc  COMPLICATIONS: None  OPERATIVE IMPLANTS: Synthes trochanteric femoral nail 11 mm / 130 degrees with interlocking helical blade 90 mm and distal locking screw 38 mm.  PREOPERATIVE INDICATIONS:  Eric Larsen is a 83 y.o. year old who fell and suffered a hip fracture. He was brought into the ER and then admitted and optimized and then elected for surgical intervention.    The risks benefits and alternatives were discussed with the patient including but not limited to the risks of nonoperative treatment, versus surgical intervention including infection, bleeding, nerve injury, malunion, nonunion, hardware prominence, hardware failure, need for hardware removal, blood clots, cardiopulmonary complications, morbidity, mortality, among others, and they were willing to proceed.    OPERATIVE PROCEDURE:  The patient was brought to the operating room and placed in the supine position.  Spinal anesthesia was administered, with a foley. He was placed on the fracture table.  Closed reduction was performed under C-arm guidance. The length of the femur was also measured using fluoroscopy. Time out was then performed after sterile prep and drape. He received preoperative antibiotics.  Incision was made proximal to the greater trochanter. A guidewire was placed in the appropriate position. Confirmation was made on AP and lateral views. The above-named nail was opened. I opened the proximal femur with a reamer. I then placed the nail by hand easily down. I did not need to ream the femur.  Once the nail was completely seated, I placed a guidepin into the femoral head into the center center position  through a second incision.  I measured the length, and then reamed the lateral cortex and up into the head. I then placed the helical blade. Slight compression was applied. Anatomic fixation achieved. Bone quality was mediocre.  I then secured the proximal interlock.  The distal locking screw was then placed and after confirming the position of the fracture fragments and hardware I then removed the instruments, and took final C-arm pictures AP and lateral the entire length of the leg. Anatomic reconstruction was achieved, and the wounds were irrigated copiously and closed with Vicryl  followed by staples and dry sterile dressing. Sponge and needle count were correct.   The patient was awakened and returned to PACU in stable and satisfactory condition. There no complications and the patient tolerated the procedure well.  He will be partial weightbearing as tolerated, and will be on Lovenox  For DVT prophylaxis.     Park Breed, M.D.

## 2017-08-13 NOTE — Clinical Social Work Placement (Signed)
   CLINICAL SOCIAL WORK PLACEMENT  NOTE  Date:  08/13/2017  Patient Details  Name: Eric Larsen MRN: [id number] Date of Birth: 04-Nov-1930  Clinical Social Work is seeking post-discharge placement for this patient at the [place] level of care (*CSW will initial, date and re-position this form in  chart as items are completed):  Yes   Patient/family provided with [facility] Work Department's list of facilities offering this level of care within the geographic area requested by the patient (or if unable, by the patient's family).  Yes   Patient/family informed of their freedom to choose among providers that offer the needed level of care, that participate in Medicare, Medicaid or managed care program needed by the patient, have an available bed and are willing to accept the patient.  Yes   Patient/family informed of [facility]'s ownership interest in [facility] and [facility], as well as of the fact that they are under no obligation to receive care at these facilities.  PASRR submitted to EDS on 08/13/17     PASRR number received on 08/13/17     Existing PASRR number confirmed on       FL2 transmitted to all facilities in geographic area requested by pt/family on 08/13/17     FL2 transmitted to all facilities within larger geographic area on       Patient informed that his/her managed care company has contracts with or will negotiate with certain facilities, including the following:            Patient/family informed of bed offers received.  Patient chooses bed at       Physician recommends and patient chooses bed at      Patient to be transferred to   on  .  Patient to be transferred to facility by       Patient family notified on   of transfer.  Name of family member notified:        PHYSICIAN       Additional Comment:    _______________________________________________ Ellinor Test, Veronia Beets, LCSW 08/13/2017, 5:04 PM

## 2017-08-13 NOTE — Consult Note (Signed)
ORTHOPAEDIC CONSULTATION  REQUESTING PHYSICIAN: Loletha Grayer, MD  Chief Complaint: right hip pain  HPI: [place] is a 83 y.o. male who complains of  Right hip pain after a fall yesterday walking his dog. Exam and x-rays in ER show a displaced intertrochanteric right hip fracture.  Have recommended surgical fixation of hip to patient and family and they agree with this plan.  Risks and benefits of surgery and post op protocol discussed as well.  Plan surgery today.  Past Medical History:  Diagnosis Date  . Hypothyroidism   . Osteoporosis   . Thyroid disease    Past Surgical History:  Procedure Laterality Date  . HIP FRACTURE SURGERY    . SHOULDER SURGERY     Social History   Socioeconomic History  . Marital status: Married    Spouse name: None  . Number of children: None  . Years of education: None  . Highest education level: None  Social Needs  . Financial resource strain: None  . Food insecurity - worry: None  . Food insecurity - inability: None  . Transportation needs - medical: None  . Transportation needs - non-medical: None  Occupational History  . None  Tobacco Use  . Smoking status: Never Smoker  . Smokeless tobacco: Never Used  Substance and Sexual Activity  . Alcohol use: Yes    Comment: social  . Drug use: No  . Sexual activity: None  Other Topics Concern  . None  Social History Narrative  . None   History reviewed. No pertinent family history. No Known Allergies Prior to Admission medications   Medication Sig Start Date End Date Taking? Authorizing Provider  Folic Acid-Vit R4-YHC W23 (FOLBEE) 2.5-25-1 MG TABS tablet Take 1 tablet by mouth daily.   Yes [provider]  SYNTHROID 112 MCG tablet Take 112 mcg by mouth daily before breakfast.   Yes [provider]   Dg Chest 1 View  Result Date: 08/12/2017 CLINICAL DATA:  Acute right hip fracture. Pre operative respiratory exam. EXAM: CHEST 1 VIEW COMPARISON:   02/03/2017 FINDINGS: Heart size and pulmonary vascularity are normal and the lungs are clear. Aortic atherosclerosis. Multiple old left rib fractures. Bold proximal left humerus fracture IMPRESSION: No acute abnormalities. Aortic atherosclerosis. Electronically Signed   By: Lorriane Shire M.D.   On: 08/12/2017 15:21   Dg Hip Unilat W Or Wo Pelvis 2-3 Views Right  Result Date: 08/12/2017 CLINICAL DATA:  Right hip pain after a fall today at the veterinarian's office. EXAM: DG HIP (WITH OR WITHOUT PELVIS) 2-3V RIGHT COMPARISON:  Pelvis radiograph dated 02/03/2017 FINDINGS: There is a slightly displaced minimally comminuted intertrochanteric fracture of the proximal right femur. Pelvic bones are intact. Benign bone island in the right ilium. Left total hip prosthesis. IMPRESSION: Slightly displaced minimally comminuted intertrochanteric fracture of the proximal right femur. Osteopenia. Electronically Signed   By: Lorriane Shire M.D.   On: 08/12/2017 15:20    Positive ROS: All other systems have been reviewed and were otherwise negative with the exception of those mentioned in the HPI and as above.  Physical Exam: General: Alert, no acute distress Cardiovascular: No pedal edema Respiratory: No cyanosis, no use of accessory musculature GI: No organomegaly, abdomen is soft and non-tender Skin: No lesions in the area of chief complaint Neurologic: Sensation intact distally Psychiatric: Patient is competent for consent with normal mood and affect Lymphatic: No axillary or cervical lymphadenopathy  MUSCULOSKELETAL: right leg pain on movement.  Leg short and rotated.  csm good and skin intact.  Healed left hip incision.   Assessment: Displaced right intertrochanteric hip fracture  Plan: Right hip nailing today    Park Breed, MD [phone]   08/13/2017 1:29 PM

## 2017-08-13 NOTE — Progress Notes (Signed)
Patient HR continues to be elevated HR 140's MD notified orders pending.

## 2017-08-13 NOTE — Progress Notes (Signed)
Pt remaining alert and oriented this shift. Medicated for pain with good results. Pt for surgery tomorrow afternoon. Pt voiding without difficulty. Tolerating bucks traction without difficulty. IV infusing without difficulty.

## 2017-08-13 NOTE — Progress Notes (Signed)
Pt returned from PACU at shift change. Pt moved to low bed. Aquacel dressing intact to right hip with scant drainage. He is alert and oriented x4, states pain is drastically improved from before surgery. No order for bucks traction postop.   [place], Jerry Caras

## 2017-08-13 NOTE — Progress Notes (Signed)
Patient HR in the 140's asymptomatic, BP 105/51. MD notified. Orders pending

## 2017-08-13 NOTE — Progress Notes (Signed)
Pt taken to OR by transport. Report given to [facility] in [place]. Pt gave watch to his wife prior to transport. Tele removed, surgical consent signed by pt.   [place], Jerry Caras

## 2017-08-13 NOTE — Transfer of Care (Signed)
Immediate Anesthesia Transfer of Care Note  Patient: Tyler Aas  Procedure(s) Performed: INTRAMEDULLARY (IM) NAIL INTERTROCHANTRIC (Right Hip)  Patient Location: PACU  Anesthesia Type:Spinal  Level of Consciousness: awake and alert   Airway & Oxygen Therapy: Patient Spontanous Breathing  Post-op Assessment: Report given to RN and Post -op Vital signs reviewed and stable  Post vital signs: Reviewed  Last Vitals:  Vitals:   08/13/17 0744 08/13/17 1736  BP: (!) 102/50 (!) 98/58  Pulse: 81 97  Resp: 16 16  Temp: 36.9 C 36.8 C  SpO2: 95% 97%    Last Pain:  Vitals:   08/13/17 1323  TempSrc:   PainSc: 8          Complications: No apparent anesthesia complications

## 2017-08-13 NOTE — NC FL2 (Signed)
[place] LEVEL OF CARE SCREENING TOOL     IDENTIFICATION  Patient Name: [place] Birthdate: [birth year] Sex: male Admission Date (Current Location): 08/12/2017  [place] and [place] Number:  Engineering geologist and Address:  [facility], [address], [place], [place] [address]      Provider Number: [id number]  Attending Physician Name and Address:  Loletha Grayer, MD  Relative Name and Phone Number:       Current Level of Care: Hospital Recommended Level of Care: [place] Prior Approval Number:    Date Approved/Denied:   PASRR Number: [id number])  Discharge Plan: SNF    Current Diagnoses: Patient Active Problem List   Diagnosis Date Noted  . Hip fracture ([place]) 08/12/2017  . Hallux valgus of left foot 04/28/2017  . Hammer toe of left foot 04/28/2017  . Corn of toe 04/28/2017  . History of total left hip replacement 09/19/2015  . Spherocytosis (familial) ([place]) 09/17/2015  . Mixed hyperlipidemia 06/19/2015  . Benign prostatic hyperplasia with lower urinary tract symptoms 06/19/2015  . History of fracture of vertebra 11/06/2014  . History of colon polyps 08/08/2014  . Pathological fracture 11/01/2013  . Toenail fungus 01/20/2013  . Vitamin D deficiency 09/27/2012  . Primary hypothyroidism 09/27/2012  . Osteoporosis 09/27/2012  . Spherocytosis ([place]) 12/04/2011  . Skin cancer 12/04/2011    Orientation RESPIRATION BLADDER Height & Weight     Self, Time, Situation, Place  Normal Continent Weight: 128 lb 4.8 oz (58.2 kg) Height:  5\' 7"  (170.2 cm)  BEHAVIORAL SYMPTOMS/MOOD NEUROLOGICAL BOWEL NUTRITION STATUS      Continent Diet(NPO for surgery to be advanced. )  AMBULATORY STATUS COMMUNICATION OF NEEDS Skin   Extensive Assist Verbally Surgical wounds                       Personal Care Assistance Level of Assistance  Bathing, Feeding, Dressing Bathing Assistance: Limited  assistance Feeding assistance: Independent Dressing Assistance: Limited assistance     Functional Limitations Info  Sight, Hearing, Speech Sight Info: Adequate Hearing Info: Adequate Speech Info: Adequate    SPECIAL CARE FACTORS FREQUENCY  PT (By licensed PT), OT (By licensed OT)     PT Frequency: (5) OT Frequency: (5)            Contractures      Additional Factors Info  Code Status, Allergies Code Status Info: (Full Code. ) Allergies Info: (No Known Allergies. )           Current Medications (08/13/2017):  This is the current hospital active medication list Current Facility-Administered Medications  Medication Dose Route Frequency Provider Last Rate Last Dose  . 0.9 %  sodium chloride infusion   Intravenous Continuous Bettey Costa, MD 75 mL/hr at 08/12/17 2326    . acetaminophen (TYLENOL) tablet 650 mg  650 mg Oral Q6H PRN Bettey Costa, MD       Or  . acetaminophen (TYLENOL) suppository 650 mg  650 mg Rectal Q6H PRN Mody, Sital, MD      . bisacodyl (DULCOLAX) EC tablet 5 mg  5 mg Oral Daily PRN Bettey Costa, MD      . calcium-vitamin D (OSCAL WITH D) 500-200 MG-UNIT per tablet 1 tablet  1 tablet Oral BID Bettey Costa, MD   1 tablet at 08/12/17 2121  . ceFAZolin (ANCEF) 2 g in dextrose 5 % 100 mL IVPB  2 g Intravenous On Call to OR Bettey Costa, MD  Stopped at 08/13/17 0531  . chlorhexidine (HIBICLENS) 4 % liquid   Topical Once Earnestine Leys, MD      . clindamycin (CLEOCIN) IVPB 600 mg  600 mg Intravenous On Call to OR Earnestine Leys, MD   Stopped at 08/13/17 [phone]  . folic acid (FOLVITE) tablet 1 mg  1 mg Oral Daily Mody, Sital, MD      . HYDROmorphone (DILAUDID) injection 0.5 mg  0.5 mg Intravenous Q3H PRN Bettey Costa, MD   0.5 mg at 08/12/17 1635  . levothyroxine (SYNTHROID, LEVOTHROID) tablet 112 mcg  112 mcg Oral QAC breakfast Mody, Sital, MD      . morphine 2 MG/ML injection 2 mg  2 mg Intravenous Q4H PRN Bettey Costa, MD   2 mg at 08/13/17 0854  . ondansetron  (ZOFRAN) tablet 4 mg  4 mg Oral Q6H PRN Bettey Costa, MD       Or  . ondansetron (ZOFRAN) injection 4 mg  4 mg Intravenous Q6H PRN Mody, Sital, MD      . polyethylene glycol (MIRALAX / GLYCOLAX) packet 17 g  17 g Oral Daily PRN Mody, Sital, MD      . pyridOXINE (VITAMIN B-6) tablet 25 mg  25 mg Oral Daily Mody, Sital, MD      . traMADol (ULTRAM) tablet 50 mg  50 mg Oral Q6H PRN Bettey Costa, MD   50 mg at 08/12/17 1855  . vitamin B-12 (CYANOCOBALAMIN) tablet 1,000 mcg  1,000 mcg Oral Daily Bettey Costa, MD         Discharge Medications: Please see discharge summary for a list of discharge medications.  Relevant Imaging Results:  Relevant Lab Results:   Additional Information (SSN: [id number])  [place], Veronia Beets, LCSW

## 2017-08-13 NOTE — Anesthesia Post-op Follow-up Note (Signed)
Anesthesia QCDR form completed.        

## 2017-08-13 NOTE — Clinical Social Work Note (Signed)
Clinical Social Work Assessment  Patient Details  Name: [place] MRN: [id number] Date of Birth: 10-24-1930  Date of referral:  08/13/17               Reason for consult:  Facility Placement                Permission sought to share information with:  Chartered certified accountant granted to share information::  Yes, Verbal Permission Granted  Name::      [place]::   [facility]   Relationship::     Contact Information:     Housing/Transportation Living arrangements for the past 2 months:  [place] of Information:  Patient, Friend/Neighbor Patient Interpreter Needed:    Criminal Activity/Legal Involvement Pertinent to Current Situation/Hospitalization:  No - Comment as needed Significant Relationships:  Adult Children, Spouse Lives with:  Spouse Do you feel safe going back to the place where you live?  Yes Need for family participation in patient care:  Yes (Comment)  Care giving concerns:  Patient lives in [place] with his wife [place].    Social Worker assessment / plan:  Holiday representative (CSW) reviewed chart and noted that patient will have surgery for a hip fracture today. CSW met with patient alone at bedside prior to surgery today. Patient was alert and oriented X4 and was laying in the bed. CSW introduced self and explained role of CSW department. Patient reported that he lives in [place] with his wife [place] and was in [place] at [employer] when he fell. CSW explained that PT will evaluate patient after surgery and make a recommendation of SNF or [place]. Patient is agreeable to SNF search in [place] if needed. Patient prefers a SNF in [place]. Per patient he has been to a SNF before in [place]. FL2 complete and faxed out. CSW also explained that medicare requires a 3 night qualifying inpatient stay in a hospital in order to pay for SNF. Patient was admitted to inpatient on  08/12/17. Patient verbalized his understanding. CSW will continue to follow and assist as needed.   Employment status:  Retired Forensic scientist:  Medicare PT Recommendations:  Not assessed at this time [place] / Referral to community resources:  [place]  Patient/Family's Response to care:  Patient is agreeable to [facility] in [place].   Patient/Family's Understanding of and Emotional Response to Diagnosis, Current Treatment, and Prognosis:  Patient was very pleasant and thanked CSW for assistance.   Emotional Assessment Appearance:  Appears stated age Attitude/Demeanor/Rapport:    Affect (typically observed):  Accepting, Adaptable, Pleasant Orientation:  Oriented to Self, Oriented to Place, Oriented to  Time, Oriented to Situation Alcohol / Substance use:  Not Applicable Psych involvement (Current and /or in the community):  No (Comment)  Discharge Needs  Concerns to be addressed:  Discharge Planning Concerns Readmission within the last 30 days:  No Current discharge risk:  Dependent with Mobility Barriers to Discharge:  Continued Medical Work up   [employer], Veronia Beets, LCSW 08/13/2017, 5:05 PM

## 2017-08-13 NOTE — Progress Notes (Signed)
Patient ID: Eric [place], male   DOB: [birth year], 83 y.o.   MRN: [id number]  [employer] PROGRESS NOTE  [place] EGB:[id number] DOB: 04/24/1931 DOA: 08/12/2017 PCP: [name], MD  HPI/Subjective: Patient is a dog pulled him over onto a tile floor and he broke his hip.  Patient feeling okay.  No complaints of chest pain or shortness of breath.  Does not feel palpitations.  On my exam his heart rate was fast.  Objective: Vitals:   08/13/17 0423 08/13/17 0744  BP: (!) 121/57 (!) 102/50  Pulse: 80 81  Resp: 19 16  Temp: 98.3 F (36.8 C) 98.4 F (36.9 C)  SpO2: 92% 95%    Filed Weights   08/12/17 1447 08/12/17 1919  Weight: 59 kg (130 lb) 58.2 kg (128 lb 4.8 oz)    ROS: Review of Systems  Constitutional: Negative for chills and fever.  Eyes: Negative for blurred vision.  Respiratory: Negative for cough and shortness of breath.   Cardiovascular: Negative for chest pain and palpitations.  Gastrointestinal: Negative for constipation, diarrhea, nausea and vomiting.  Genitourinary: Negative for dysuria.  Musculoskeletal: Positive for joint pain.  Neurological: Negative for dizziness and headaches.   Exam: Physical Exam  Constitutional: He is oriented to person, place, and time.  HENT:  Nose: No mucosal edema.  Mouth/Throat: No oropharyngeal exudate or posterior oropharyngeal edema.  Eyes: Conjunctivae, EOM and lids are normal. Pupils are equal, round, and reactive to light.  Neck: No JVD present. Carotid bruit is not present. No edema present. No thyroid mass and no thyromegaly present.  Cardiovascular: S1 normal and S2 normal. Tachycardia present. Exam reveals no gallop.  No murmur heard. Pulses:      Dorsalis pedis pulses are 2+ on the right side, and 2+ on the left side.  Respiratory: No respiratory distress. He has no wheezes. He has no rhonchi. He has no rales.  GI: Soft. Bowel sounds are normal. There is no tenderness.  Musculoskeletal:        Right ankle: He exhibits no swelling.       Left ankle: He exhibits no swelling.  Lymphadenopathy:    He has no cervical adenopathy.  Neurological: He is alert and oriented to person, place, and time. No cranial nerve deficit.  Sensation right foot and able to flex and extend and ankle.  Skin: Skin is warm. No rash noted. Nails show no clubbing.  Psychiatric: He has a normal mood and affect.      Data Reviewed: Basic Metabolic Panel: Recent Labs  Lab 08/12/17 1521 08/13/17 0412  NA 139 138  K 4.0 4.2  CL 104 105  CO2 30 29  GLUCOSE 192* 142*  BUN 25* 21*  CREATININE 0.98 0.96  CALCIUM 9.2 9.0   CBC: Recent Labs  Lab 08/12/17 1521 08/13/17 0412  WBC 9.9 12.0*  HGB 12.0* 10.0*  HCT 33.9* 28.3*  MCV 96.6 95.8  PLT 226 194     Recent Results (from the past 240 hour(s))  MRSA PCR Screening     Status: None   Collection Time: 08/12/17  9:17 PM  Result Value Ref Range Status   MRSA by PCR NEGATIVE NEGATIVE Final    Comment:        The GeneXpert MRSA Assay (FDA approved for NASAL specimens only), is one component of a comprehensive MRSA colonization surveillance program. It is not intended to diagnose MRSA infection nor to guide or monitor treatment for MRSA infections. Performed at [facility],  [place], [place] [address]      Studies: Dg Chest 1 View  Result Date: 08/12/2017 CLINICAL DATA:  Acute right hip fracture. Pre operative respiratory exam. EXAM: CHEST 1 VIEW COMPARISON:  02/03/2017 FINDINGS: Heart size and pulmonary vascularity are normal and the lungs are clear. Aortic atherosclerosis. Multiple old left rib fractures. Bold proximal left humerus fracture IMPRESSION: No acute abnormalities. Aortic atherosclerosis. Electronically Signed   By: Lorriane Shire M.D.   On: 08/12/2017 15:21   Dg Hip Unilat W Or Wo Pelvis 2-3 Views Right  Result Date: 08/12/2017 CLINICAL DATA:  Right hip pain after a fall today at the  veterinarian's office. EXAM: DG HIP (WITH OR WITHOUT PELVIS) 2-3V RIGHT COMPARISON:  Pelvis radiograph dated 02/03/2017 FINDINGS: There is a slightly displaced minimally comminuted intertrochanteric fracture of the proximal right femur. Pelvic bones are intact. Benign bone island in the right ilium. Left total hip prosthesis. IMPRESSION: Slightly displaced minimally comminuted intertrochanteric fracture of the proximal right femur. Osteopenia. Electronically Signed   By: Lorriane Shire M.D.   On: 08/12/2017 15:20    Scheduled Meds: . calcium-vitamin D  1 tablet Oral BID  . chlorhexidine   Topical Once  . folic acid  1 mg Oral Daily  . levothyroxine  112 mcg Oral QAC breakfast  . pyridOXINE  25 mg Oral Daily  . vitamin B-12  1,000 mcg Oral Daily   Continuous Infusions: . sodium chloride 75 mL/hr at 08/12/17 2326  . ceFAZolin (ANCEF) IVPB (doses >1 g) Stopped (08/13/17 0531)  . clindamycin (CLEOCIN) IV Stopped (08/13/17 0531)    Assessment/Plan:  1. Relative hypotension and tachycardia.  The patient just received some morphine.  Hypotension could be from the morphine.  Tachycardia likely secondary to pain.  Placed on telemetry monitoring for right now.  Continue IV fluids for dehydration.  Patient is sinus tachycardia in the 120s.  PRN IV metoprolol for heart rate greater than 135. 2. intertrochanteric fracture of the right proximal femur.  No contraindications to surgery at this time. 3. Hypothyroidism unspecified on levothyroxine 4. History of osteoporosis 5. B12 deficiency  Code Status:     Code Status Orders  (From admission, onward)        Start     Ordered   08/12/17 1921  Full code  Continuous     08/12/17 1920    Code Status History    Date Active Date Inactive Code Status Order ID Comments User Context   This patient has a current code status but no historical code status.    Advance Directive Documentation     Most Recent Value  Type of Advance Directive  Living  will  Pre-existing out of facility DNR order (yellow form or pink MOST form)  No data  "MOST" Form in Place?  No data      Disposition Plan: Rehab likely on Monday  Consultants:  Orthopedic surgery  Time spent: 28 minutes  [place]

## 2017-08-13 NOTE — H&P (Signed)
THE PATIENT WAS SEEN PRIOR TO SURGERY TODAY.  HISTORY, ALLERGIES, HOME MEDICATIONS AND OPERATIVE PROCEDURE WERE REVIEWED. RISKS AND BENEFITS OF SURGERY DISCUSSED WITH PATIENT AGAIN.  NO CHANGES FROM INITIAL HISTORY AND PHYSICAL NOTED.    

## 2017-08-14 LAB — CBC
HCT: 23.7 % — ABNORMAL LOW (ref 40.0–52.0)
Hemoglobin: 8.4 g/dL — ABNORMAL LOW (ref 13.0–18.0)
MCH: 33.7 pg (ref 26.0–34.0)
MCHC: 35.3 g/dL (ref 32.0–36.0)
MCV: 95.3 fL (ref 80.0–100.0)
PLATELETS: 168 10*3/uL (ref 150–440)
RBC: 2.49 MIL/uL — AB (ref 4.40–5.90)
RDW: 22.9 % — ABNORMAL HIGH (ref 11.5–14.5)
WBC: 12.2 10*3/uL — ABNORMAL HIGH (ref 3.8–10.6)

## 2017-08-14 LAB — BASIC METABOLIC PANEL
Anion gap: 5 (ref 5–15)
BUN: 18 mg/dL (ref 6–20)
CALCIUM: 8.1 mg/dL — AB (ref 8.9–10.3)
CO2: 26 mmol/L (ref 22–32)
CREATININE: 0.9 mg/dL (ref 0.61–1.24)
Chloride: 101 mmol/L (ref 101–111)
Glucose, Bld: 165 mg/dL — ABNORMAL HIGH (ref 65–99)
Potassium: 3.9 mmol/L (ref 3.5–5.1)
SODIUM: 132 mmol/L — AB (ref 135–145)

## 2017-08-14 MED ORDER — METOPROLOL TARTRATE 25 MG PO TABS
12.5000 mg | ORAL_TABLET | Freq: Two times a day (BID) | ORAL | Status: DC
  Administered 2017-08-14 (×2): 12.5 mg via ORAL
  Filled 2017-08-14 (×2): qty 1

## 2017-08-14 MED ORDER — ENOXAPARIN SODIUM 40 MG/0.4ML ~~LOC~~ SOLN
40.0000 mg | SUBCUTANEOUS | Status: DC
  Administered 2017-08-15 – 2017-08-16 (×2): 40 mg via SUBCUTANEOUS
  Filled 2017-08-14 (×2): qty 0.4

## 2017-08-14 NOTE — Progress Notes (Signed)
Patient ID: Eric Larsen, male   DOB: [birth year], 83 y.o.   MRN: [id number]   [facility] PROGRESS NOTE  Eric Larsen [name] DOB: 11/28/1930 DOA: 08/12/2017 PCP: [name], MD  HPI/Subjective: Patient underwent procedure yesterday.  States he feels sore in his hip.  Last night had an episode of SVT which required IV medication.  Objective: Vitals:   08/14/17 0751 08/14/17 1537  BP: (!) 108/50 113/60  Pulse: 81 70  Resp:    Temp: 98.5 F (36.9 C) 98 F (36.7 C)  SpO2: 94% 97%    Filed Weights   08/12/17 1447 08/12/17 1919  Weight: 59 kg (130 lb) 58.2 kg (128 lb 4.8 oz)    ROS: Review of Systems  Constitutional: Negative for chills and fever.  Eyes: Negative for blurred vision.  Respiratory: Negative for cough and shortness of breath.   Cardiovascular: Negative for chest pain and palpitations.  Gastrointestinal: Negative for abdominal pain, constipation, diarrhea, nausea and vomiting.  Genitourinary: Negative for dysuria.  Musculoskeletal: Positive for joint pain.  Neurological: Negative for dizziness and headaches.   Exam: Physical Exam  Constitutional: He is oriented to person, place, and time.  HENT:  Nose: No mucosal edema.  Mouth/Throat: No oropharyngeal exudate or posterior oropharyngeal edema.  Eyes: Conjunctivae, EOM and lids are normal. Pupils are equal, round, and reactive to light.  Neck: No JVD present. Carotid bruit is not present. No edema present. No thyroid mass and no thyromegaly present.  Cardiovascular: S1 normal and S2 normal. Tachycardia present. Exam reveals no gallop.  No murmur heard. Pulses:      Dorsalis pedis pulses are 2+ on the right side, and 2+ on the left side.  Respiratory: No respiratory distress. He has no wheezes. He has no rhonchi. He has no rales.  GI: Soft. Bowel sounds are normal. There is no tenderness.  Musculoskeletal:       Right ankle: He exhibits no swelling.       Left ankle: He  exhibits no swelling.  Lymphadenopathy:    He has no cervical adenopathy.  Neurological: He is alert and oriented to person, place, and time. No cranial nerve deficit.  Sensation right foot and able to flex and extend and ankle.  Skin: Skin is warm. No rash noted. Nails show no clubbing.  Psychiatric: He has a normal mood and affect.      Data Reviewed: Basic Metabolic Panel: Recent Labs  Lab 08/12/17 1521 08/13/17 0412 08/14/17 0312  NA 139 138 132*  K 4.0 4.2 3.9  CL 104 105 101  CO2 30 29 26   GLUCOSE 192* 142* 165*  BUN 25* 21* 18  CREATININE 0.98 0.96 0.90  CALCIUM 9.2 9.0 8.1*   CBC: Recent Labs  Lab 08/12/17 1521 08/13/17 0412 08/13/17 2123 08/14/17 0312  WBC 9.9 12.0* 15.8* 12.2*  HGB 12.0* 10.0* 9.5* 8.4*  HCT 33.9* 28.3* 27.0* 23.7*  MCV 96.6 95.8 95.2 95.3  PLT 226 194 179 168     Recent Results (from the past 240 hour(s))  MRSA PCR Screening     Status: None   Collection Time: 08/12/17  9:17 PM  Result Value Ref Range Status   MRSA by PCR NEGATIVE NEGATIVE Final    Comment:        The GeneXpert MRSA Assay (FDA approved for NASAL specimens only), is one component of a comprehensive MRSA colonization surveillance program. It is not intended to diagnose MRSA infection nor to guide or monitor treatment for MRSA  infections. Performed at [facility], [address] [place], [place] [address]      Studies: Dg C-arm 30-160 Min  Result Date: 08/13/2017 CLINICAL DATA:  Hip fracture fixation EXAM: DG C-ARM 61-120 MIN; RIGHT FEMUR 2 VIEWS COMPARISON:  08/12/2017 FINDINGS: Right intertrochanteric femur fracture has been fixed with a screw and rod. Fracture alignment satisfactory. Hardware in satisfactory position. IMPRESSION: Satisfactory surgical fixation of intertrochanteric fracture on the right. Electronically Signed   By: Franchot Gallo M.D.   On: 08/13/2017 17:37   Dg Femur, Min 2 Views Right  Result Date: 08/13/2017 CLINICAL DATA:   Hip fracture fixation EXAM: DG C-ARM 61-120 MIN; RIGHT FEMUR 2 VIEWS COMPARISON:  08/12/2017 FINDINGS: Right intertrochanteric femur fracture has been fixed with a screw and rod. Fracture alignment satisfactory. Hardware in satisfactory position. IMPRESSION: Satisfactory surgical fixation of intertrochanteric fracture on the right. Electronically Signed   By: Franchot Gallo M.D.   On: 08/13/2017 17:37    Scheduled Meds: . [START ON 08/15/2017] enoxaparin (LOVENOX) injection  40 mg Subcutaneous Q24H  . ferrous sulfate  325 mg Oral Q breakfast  . levothyroxine  112 mcg Oral QAC breakfast  . metoprolol tartrate  12.5 mg Oral BID  . senna  1 tablet Oral BID   Continuous Infusions: . sodium chloride 100 mL/hr at 08/14/17 1200  . ceFAZolin (ANCEF) IVPB (doses >1 g) Stopped (08/14/17 1159)  . clindamycin (CLEOCIN) IV Stopped (08/14/17 1093)    Assessment/Plan:  1. Episode of SVT and sinus tachycardia.  I started low-dose metoprolol orally today.  Heart rate better this afternoon.   2. Intertrochanteric fracture of the right proximal femur requiring operative repair.  Likely out to rehab Monday.  As needed pain control.  Lovenox for DVT prophylaxis 3. Hypothyroidism unspecified on levothyroxine 4. History of osteoporosis 5. B12 deficiency 6. Postoperative anemia continue to watch hemoglobin.  Discontinue IV fluids.  Iron started.  Code Status:     Code Status Orders  (From admission, onward)        Start     Ordered   08/12/17 1921  Full code  Continuous     08/12/17 1920    Code Status History    Date Active Date Inactive Code Status Order ID Comments User Context   This patient has a current code status but no historical code status.    Advance Directive Documentation     Most Recent Value  Type of Advance Directive  Living will  Pre-existing out of facility DNR order (yellow form or pink MOST form)  No data  "MOST" Form in Place?  No data      Disposition Plan: Rehab  likely on Monday  Consultants:  Orthopedic surgery  Time spent: 25 minutes  [place]

## 2017-08-14 NOTE — Clinical Social Work Note (Signed)
CSW received consult for possible STR. CSW will make bed offers pending PT recommendation.  Santiago Bumpers, MSW, Latanya Presser  [phone]

## 2017-08-14 NOTE — Evaluation (Signed)
Occupational Therapy Evaluation Patient Details Name: Eric Larsen MRN: [id number] DOB: [birth year] Today's Date: 08/14/2017    History of Present Illness Pt is an 83 y.o. male presenting to hospital s/p fall (pt pulled down by dog).  Imaging showing slightly displaced minimally comminuted intertrochanteric fx of proximal R femur.  Pt s/p R IMN intertrochanteric 08/13/17.  HR in 140's post-op.  PMH includes L THA and shoulder surgery.   Clinical Impression   Patient seen for OT evaluation this date.  Patient reports he lives at home with his wife in a 2 story home, his bedroom is on the 2nd level but he is able to stay on the first level if needed.  He reports he was at home with the dog inside and had the dog on the leash, the dog got excited and the patient fell resulting in comminuted intertrochanteric fracture.  He underwent surgery for an IM nailing to the right hip and is now [facility] on the right. He was previously independent with daily tasks, ADLs, IADLs, yardwork and driving.  He presents with muscle weakness, decreased transfers, functional mobility, decreased ability to perform basic self care tasks, IADLs and decreased balance.  He would benefit from skilled OT to maximize his safety and independence in necessary daily tasks. He would benefit from short term rehab prior to returning given his current status.     Follow Up Recommendations  SNF    Equipment Recommendations       Recommendations for Other Services       Precautions / Restrictions Precautions Precautions: Fall Restrictions Weight Bearing Restrictions: Yes RLE Weight Bearing: Partial weight bearing RLE Partial Weight Bearing Percentage or Pounds: able to recall wb status, "half of 130 pounds" Other Position/Activity Restrictions: watch HR      Mobility Bed Mobility Overal bed mobility: Needs Assistance Bed Mobility: Supine to Sit     Supine to sit: Max assist;HOB elevated Sit to supine: Min assist;+2 for  physical assistance   General bed mobility comments: assist for trunk and R LE supine to sit  Transfers Overall transfer level: Needs assistance Equipment used: Rolling walker (2 wheeled) Transfers: Sit to/from Stand Sit to Stand: +2 physical assistance;Mod assist         General transfer comment: assist to initiate and come to full stand; vc's for hand placement and PWB'ing status required    Balance Overall balance assessment: Needs assistance Sitting-balance support: Bilateral upper extremity supported;Feet supported Sitting balance-[name]: Poor Sitting balance - Comments: varying between CGA to mod assist d/t L lateral lean (d/t R hip/thigh pain) Postural control: Left lateral lean Standing balance support: Bilateral upper extremity supported Standing balance-[name]: Poor Standing balance comment: post lean during turning to bed.  +2 assist to maintain upright                            ADL either performed or assessed with clinical judgement   ADL Overall ADL's : Needs assistance/impaired Eating/Feeding: Modified independent   Grooming: Sitting;Modified independent   Upper Body Bathing: Modified independent   Lower Body Bathing: Maximal assistance   Upper Body Dressing : Set up   Lower Body Dressing: Maximal assistance   Toilet Transfer: +2 for physical assistance   Toileting- Clothing Manipulation and Hygiene: +2 for safety/equipment;+2 for physical assistance       Functional mobility during ADLs: +2 for physical assistance       Vision Baseline Vision/History: Wears glasses Wears  Glasses: At all times       Perception     Praxis      Pertinent Vitals/Pain Pain Assessment: 0-10 Pain Score: 0-No pain Pain Descriptors / Indicators: Burning;Operative site guarding Pain Intervention(s): Limited activity within patient's tolerance;Monitored during session     Hand Dominance Right   Extremity/Trunk Assessment Upper Extremity  Assessment Upper Extremity Assessment: Overall WFL for tasks assessed   Lower Extremity Assessment Lower Extremity Assessment: Generalized weakness   Cervical / Trunk Assessment Cervical / Trunk Assessment: Normal   Communication Communication Communication: HOH   Cognition Arousal/Alertness: Awake/alert Behavior During Therapy: WFL for tasks assessed/performed Overall Cognitive Status: Within Functional Limits for tasks assessed                                     General Comments    Exercises   Shoulder Instructions      Home Living Family/patient expects to be discharged to:: Private residence Living Arrangements: Spouse/significant other Available Help at Discharge: Family Type of Home: House Home Access: Stairs to enter Technical brewer of Steps: 4 Entrance Stairs-Rails: Right;Left;Can reach both Home Layout: Two level;Able to live on main level with bedroom/bathroom     Bathroom Shower/Tub: Walk-in shower;Door   [employer] Toilet: Standard Bathroom Accessibility: Yes   Home Equipment: Shower seat;Grab bars - tub/shower;Hand held Tourist information centre manager - 4 wheels;Cane - single point;Other (comment)          Prior Functioning/Environment Level of Independence: Independent        Comments: Pt reports no other falls in past 6 months.  reports he was driving, doing yardwork.  Has a dog at home, had the dog on the leash and it moved unexpectedly and patient fell.         OT Problem List: Decreased strength;Decreased activity tolerance;Decreased knowledge of use of DME or AE;Decreased range of motion;Impaired balance (sitting and/or standing);Pain      OT Treatment/Interventions: Self-care/ADL training;DME and/or AE instruction;Therapeutic activities;Balance training;Therapeutic exercise;Patient/family education    OT Goals(Current goals can be found in the care plan section) Acute Rehab OT Goals Patient Stated Goal: to be able to take care of  myself, go back home eventually OT Goal Formulation: With patient Time For Goal Achievement: 08/28/17 Potential to Achieve Goals: Good ADL Goals Pt Will Perform Lower Body Dressing: (P) with min assist;with adaptive equipment Pt Will Transfer to Toilet: (P) with min assist  OT Frequency: Min 1X/week   Barriers to D/C:            Co-evaluation              AM-PAC PT "6 Clicks" Daily Activity     Outcome Measure Help from another person eating meals?: None Help from another person taking care of personal grooming?: A Little Help from another person toileting, which includes using toliet, bedpan, or urinal?: Total Help from another person bathing (including washing, rinsing, drying)?: A Lot Help from another person to put on and taking off regular upper body clothing?: A Little Help from another person to put on and taking off regular lower body clothing?: Total 6 Click Score: 14   End of Session    Activity Tolerance: Patient tolerated treatment well Patient left: in bed;with call bell/phone within reach;with bed alarm set  OT Visit Diagnosis: Unsteadiness on feet (R26.81);History of falling (Z91.81);Muscle weakness (generalized) (M62.81);Pain Pain - Right/Left: Right Pain - part of body: Hip  Time: 2060-1561 OT Time Calculation (min): 23 min Charges:  OT General Charges $OT Visit: 1 Visit OT Evaluation $OT Eval Low Complexity: 1 Low OT Treatments $Self Care/Home Management : 8-22 mins G-Codes: OT G-codes **NOT FOR INPATIENT CLASS** Functional Assessment Tool Used: AM-PAC 6 Clicks Daily Activity Functional Limitation: Self care Self Care Current Status (B3794): At least 40 percent but less than 60 percent impaired, limited or restricted Self Care Goal Status (F2761): At least 20 percent but less than 40 percent impaired, limited or restricted   Cong Hightower T Shadi Sessler, OTR/L, CLT   Miyoko Hashimi 08/14/2017, 4:16 PM

## 2017-08-14 NOTE — Progress Notes (Signed)
lovenox increased to 40mg  daily due to crcl >30 and TWB >45kg  Melissa D Maccia, Pharm.D, BCPS Clinical Pharmacist

## 2017-08-14 NOTE — Evaluation (Signed)
Physical Therapy Evaluation Patient Details Name: Eric Larsen MRN: [id number] DOB: November 25, 1930 Today's Date: 08/14/2017   History of Present Illness  Pt is an 83 y.o. male presenting to hospital s/p fall (pt pulled down by dog).  Imaging showing slightly displaced minimally comminuted intertrochanteric fx of proximal R femur.  Pt s/p R IMN intertrochanteric 08/13/17.  HR in 140's post-op.  PMH includes L THA and shoulder surgery.  Clinical Impression  Prior to hospital admission, pt was independent with functional mobility.  Pt lives with his wife and can stay on main level of home (4 steps to enter with B railing).  Currently pt is max assist supine to sit; mod to max assist to stand with RW; and mod assist to ambulate a few feet with RW bed to recliner.  Pt requiring consistent vc's for PWB'ing status during functional mobility.  Nursing reporting pt's HR decreased to 38 bpm during session (rest of session HR noted to be in 70-80's bpm.  Pt would benefit from skilled PT to address noted impairments and functional limitations (see below for any additional details).  Upon hospital discharge, recommend pt discharge to [place].    Follow Up Recommendations SNF    Equipment Recommendations  Rolling walker with 5" wheels    Recommendations for Other Services OT consult     Precautions / Restrictions Precautions Precautions: Fall Restrictions Weight Bearing Restrictions: Yes RLE Weight Bearing: Partial weight bearing      Mobility  Bed Mobility Overal bed mobility: Needs Assistance Bed Mobility: Supine to Sit     Supine to sit: Max assist;HOB elevated     General bed mobility comments: assist for trunk and R LE supine to sit  Transfers Overall transfer level: Needs assistance Equipment used: Rolling walker (2 wheeled) Transfers: Sit to/from Stand Sit to Stand: Mod assist;Max assist         General transfer comment: assist to initiate and come to full stand; vc's for hand  placement and PWB'ing status required  Ambulation/Gait Ambulation/Gait assistance: Mod assist Ambulation Distance (Feet): 3 Feet(bed to recliner) Assistive device: Rolling walker (2 wheeled)   Gait velocity: decreased   General Gait Details: vc's to increase UE support through RW to offweight R LE for PWB'ing status; vc's and assist for walker navigation and gait pattern  Stairs            Wheelchair Mobility    Modified Rankin (Stroke Patients Only)       Balance Overall balance assessment: Needs assistance Sitting-balance support: Bilateral upper extremity supported;Feet supported Sitting balance-[name]: Poor Sitting balance - Comments: varying between CGA to mod assist d/t L lateral lean (d/t R hip/thigh pain) Postural control: Left lateral lean Standing balance support: Bilateral upper extremity supported Standing balance-[name]: Poor Standing balance comment: requires at least min assist for static standing balance with B UE support on RW (d/t posterior lean initially standing)                             Pertinent Vitals/Pain Pain Assessment: 0-10 Pain Score: 4  Pain Descriptors / Indicators: Burning Pain Intervention(s): Limited activity within patient's tolerance;Monitored during session;Premedicated before session;Repositioned     Home Living Family/patient expects to be discharged to:: Private residence Living Arrangements: Spouse/significant other Available Help at Discharge: Family Type of Home: House Home Access: Stairs to enter Entrance Stairs-Rails: Right;Left;Can reach both Entrance Stairs-Number of Steps: 4 Home Layout: Two level;Able to live on main  level with bedroom/bathroom Home Equipment: Shower seat;Grab bars - tub/shower;Hand held shower head;Walker - 4 wheels;Cane - single point;Other (comment)(hemi-walker; transport chair)      Prior Function Level of Independence: Independent         Comments: Pt reports no  other falls in past 6 months.     Hand Dominance        Extremity/Trunk Assessment   Upper Extremity Assessment Upper Extremity Assessment: Generalized weakness    Lower Extremity Assessment Lower Extremity Assessment: (at least 3/5 AROM R DF; at least 2+/5 R hip flexion; at least 3-/5 R knee flexion/extension)    Cervical / Trunk Assessment Cervical / Trunk Assessment: Normal  Communication   Communication: HOH  Cognition Arousal/Alertness: Awake/alert Behavior During Therapy: WFL for tasks assessed/performed Overall Cognitive Status: Within Functional Limits for tasks assessed                                        General Comments General comments (skin integrity, edema, etc.): Pt resting in bed upon PT entry; wife present.  Pt agreeable to PT session.    Exercises Total Joint Exercises Ankle Circles/Pumps: AROM;Strengthening;Both;10 reps;Supine Quad Sets: Strengthening;Both;10 reps;Supine Short Arc Quad: AAROM;Strengthening;Right;10 reps;Supine Heel Slides: AAROM;Strengthening;Right;10 reps;Supine Hip ABduction/ADduction: AAROM;Strengthening;Right;10 reps;Supine Straight Leg Raises: AAROM;Strengthening;Right;10 reps;Supine   Assessment/Plan    PT Assessment Patient needs continued PT services  PT Problem List Decreased strength;Decreased activity tolerance;Decreased balance;Decreased mobility;Decreased knowledge of use of DME;Decreased knowledge of precautions;Pain       PT Treatment Interventions DME instruction;Gait training;Stair training;Functional mobility training;Therapeutic activities;Therapeutic exercise;Balance training;Patient/family education    PT Goals (Current goals can be found in the Care Plan section)  Acute Rehab PT Goals Patient Stated Goal: to get stronger PT Goal Formulation: With patient Time For Goal Achievement: 08/28/17 Potential to Achieve Goals: Good    Frequency BID   Barriers to discharge Decreased caregiver  support      Co-evaluation               AM-PAC PT "6 Clicks" Daily Activity  Outcome Measure Difficulty turning over in bed (including adjusting bedclothes, sheets and blankets)?: Unable Difficulty moving from lying on back to sitting on the side of the bed? : Unable Difficulty sitting down on and standing up from a chair with arms (e.g., wheelchair, bedside commode, etc,.)?: Unable Help needed moving to and from a bed to chair (including a wheelchair)?: A Lot Help needed walking in hospital room?: Total Help needed climbing 3-5 steps with a railing? : Total 6 Click Score: 7    End of Session Equipment Utilized During Treatment: Gait belt Activity Tolerance: Patient limited by fatigue Patient left: in chair;with call bell/phone within reach;with chair alarm set;with family/visitor present;with SCD's reapplied(B heels elevated via pillow) Nurse Communication: Mobility status;Precautions;Weight bearing status(Pt's IV leaking) PT Visit Diagnosis: Other abnormalities of gait and mobility (R26.89);Muscle weakness (generalized) (M62.81);History of falling (Z91.81);Pain Pain - Right/Left: Right Pain - part of body: Hip    Time: 1130-1219 PT Time Calculation (min) (ACUTE ONLY): 49 min   Charges:   PT Evaluation $PT Eval Low Complexity: 1 Low PT Treatments $Therapeutic Exercise: 8-22 mins $Therapeutic Activity: 8-22 mins   PT G Codes:   PT G-Codes **NOT FOR INPATIENT CLASS** Functional Assessment Tool Used: AM-PAC 6 Clicks Basic Mobility Functional Limitation: Mobility: Walking and moving around Mobility: Walking and Moving Around Current Status (M8413): At least 80  percent but less than 100 percent impaired, limited or restricted Mobility: Walking and Moving Around Goal Status [id number]): At least 1 percent but less than 20 percent impaired, limited or restricted    Leitha Bleak, PT 08/14/17, 12:58 PM [phone]

## 2017-08-14 NOTE — Progress Notes (Signed)
Subjective:  Patient reports pain as mild.    Objective:   VITALS:   Vitals:   08/13/17 2120 08/13/17 2247 08/14/17 0417 08/14/17 0751  BP: 120/73 (!) 119/57 (!) 113/48 (!) 108/50  Pulse: (!) 147 (!) 149 81 81  Resp: 16 20 18    Temp: 98.2 F (36.8 C) 98.5 F (36.9 C) 97.9 F (36.6 C) 98.5 F (36.9 C)  TempSrc: Oral Oral Oral Axillary  SpO2: 92% 92% 95% 94%  Weight:      Height:        PHYSICAL EXAM:  Sensation intact distally Dorsiflexion/Plantar flexion intact Incision: dressing C/D/I  LABS  Results for orders placed or performed during the hospital encounter of 08/12/17 (from the past 24 hour(s))  CBC     Status: Abnormal   Collection Time: 08/13/17  9:23 PM  Result Value Ref Range   WBC 15.8 (H) 3.8 - 10.6 K/uL   RBC 2.84 (L) 4.40 - 5.90 MIL/uL   Hemoglobin 9.5 (L) 13.0 - 18.0 g/dL   HCT 27.0 (L) 40.0 - 52.0 %   MCV 95.2 80.0 - 100.0 fL   MCH 33.4 26.0 - 34.0 pg   MCHC 35.1 32.0 - 36.0 g/dL   RDW 23.2 (H) 11.5 - 14.5 %   Platelets 179 150 - 440 K/uL  CBC     Status: Abnormal   Collection Time: 08/14/17  3:12 AM  Result Value Ref Range   WBC 12.2 (H) 3.8 - 10.6 K/uL   RBC 2.49 (L) 4.40 - 5.90 MIL/uL   Hemoglobin 8.4 (L) 13.0 - 18.0 g/dL   HCT 23.7 (L) 40.0 - 52.0 %   MCV 95.3 80.0 - 100.0 fL   MCH 33.7 26.0 - 34.0 pg   MCHC 35.3 32.0 - 36.0 g/dL   RDW 22.9 (H) 11.5 - 14.5 %   Platelets 168 150 - 440 K/uL  Basic metabolic panel     Status: Abnormal   Collection Time: 08/14/17  3:12 AM  Result Value Ref Range   Sodium 132 (L) 135 - 145 mmol/L   Potassium 3.9 3.5 - 5.1 mmol/L   Chloride 101 101 - 111 mmol/L   CO2 26 22 - 32 mmol/L   Glucose, Bld 165 (H) 65 - 99 mg/dL   BUN 18 6 - 20 mg/dL   Creatinine, Ser 0.90 0.61 - 1.24 mg/dL   Calcium 8.1 (L) 8.9 - 10.3 mg/dL   GFR calc non Af Amer >60 >60 mL/min   GFR calc Af Amer >60 >60 mL/min   Anion gap 5 5 - 15    Dg Chest 1 View  Result Date: 08/12/2017 CLINICAL DATA:  Acute right hip fracture.  Pre operative respiratory exam. EXAM: CHEST 1 VIEW COMPARISON:  02/03/2017 FINDINGS: Heart size and pulmonary vascularity are normal and the lungs are clear. Aortic atherosclerosis. Multiple old left rib fractures. Bold proximal left humerus fracture IMPRESSION: No acute abnormalities. Aortic atherosclerosis. Electronically Signed   By: Lorriane Shire M.D.   On: 08/12/2017 15:21   Dg C-arm 61-120 Min  Result Date: 08/13/2017 CLINICAL DATA:  Hip fracture fixation EXAM: DG C-ARM 61-120 MIN; RIGHT FEMUR 2 VIEWS COMPARISON:  08/12/2017 FINDINGS: Right intertrochanteric femur fracture has been fixed with a screw and rod. Fracture alignment satisfactory. Hardware in satisfactory position. IMPRESSION: Satisfactory surgical fixation of intertrochanteric fracture on the right. Electronically Signed   By: Franchot Gallo M.D.   On: 08/13/2017 17:37   Dg Hip Unilat W Or Wo Pelvis 2-3 Views  Right  Result Date: 08/12/2017 CLINICAL DATA:  Right hip pain after a fall today at the veterinarian's office. EXAM: DG HIP (WITH OR WITHOUT PELVIS) 2-3V RIGHT COMPARISON:  Pelvis radiograph dated 02/03/2017 FINDINGS: There is a slightly displaced minimally comminuted intertrochanteric fracture of the proximal right femur. Pelvic bones are intact. Benign bone island in the right ilium. Left total hip prosthesis. IMPRESSION: Slightly displaced minimally comminuted intertrochanteric fracture of the proximal right femur. Osteopenia. Electronically Signed   By: Lorriane Shire M.D.   On: 08/12/2017 15:20   Dg Femur, Min 2 Views Right  Result Date: 08/13/2017 CLINICAL DATA:  Hip fracture fixation EXAM: DG C-ARM 61-120 MIN; RIGHT FEMUR 2 VIEWS COMPARISON:  08/12/2017 FINDINGS: Right intertrochanteric femur fracture has been fixed with a screw and rod. Fracture alignment satisfactory. Hardware in satisfactory position. IMPRESSION: Satisfactory surgical fixation of intertrochanteric fracture on the right. Electronically Signed   By:  Franchot Gallo M.D.   On: 08/13/2017 17:37    Assessment/Plan: 1 Day Post-Op   Active Problems:   Hip fracture ([place])   Up with therapy   Lovell Sheehan , MD 08/14/2017, 2:15 PM

## 2017-08-14 NOTE — Progress Notes (Signed)
Physical Therapy Treatment Patient Details Name: Eric Larsen MRN: [id number] DOB: 04/05/1931 Today's Date: 08/14/2017    History of Present Illness Pt is an 83 y.o. male presenting to hospital s/p fall (pt pulled down by dog).  Imaging showing slightly displaced minimally comminuted intertrochanteric fx of proximal R femur.  Pt s/p R IMN intertrochanteric 08/13/17.  HR in 140's post-op.  PMH includes L THA and shoulder surgery.    PT Comments    Pt sleeping in recliner. Ready to return to bed.  Stood with mod +2 assist.  He was able to turn to bed with mod a +2 as he had significant post lean and difficulty stepping towards bed and maintaining balance.  Min a +2 to return to supine.  Participated in exercises as described below.  Overall tolerated well but continues with limitations in mobility and safety that make SNF appropriate upon discharge.   Follow Up Recommendations  SNF     Equipment Recommendations  Rolling walker with 5" wheels    Recommendations for Other Services OT consult     Precautions / Restrictions Precautions Precautions: Fall Restrictions Weight Bearing Restrictions: Yes RLE Weight Bearing: Partial weight bearing RLE Partial Weight Bearing Percentage or Pounds: able to recall wb status, "half of 130 pounds" Other Position/Activity Restrictions: watch HR    Mobility  Bed Mobility Overal bed mobility: Needs Assistance Bed Mobility: Supine to Sit     Supine to sit: Max assist;HOB elevated Sit to supine: Min assist;+2 for physical assistance   General bed mobility comments: assist for trunk and R LE supine to sit  Transfers Overall transfer level: Needs assistance Equipment used: Rolling walker (2 wheeled) Transfers: Sit to/from Stand Sit to Stand: +2 physical assistance;Mod assist         General transfer comment: assist to initiate and come to full stand; vc's for hand placement and PWB'ing status  required  Ambulation/Gait Ambulation/Gait assistance: Mod assist;+2 physical assistance Ambulation Distance (Feet): 3 Feet Assistive device: Rolling walker (2 wheeled) Gait Pattern/deviations: Step-to pattern;Decreased step length - left;Decreased step length - right;Narrow base of support;Leaning posteriorly Gait velocity: decreased Gait velocity interpretation: <1.8 ft/sec, indicative of risk for recurrent falls General Gait Details: vc's to increase UE support through RW to offweight R LE for PWB'ing status; vc's and assist for walker navigation and gait pattern   Stairs            Wheelchair Mobility    Modified Rankin (Stroke Patients Only)       Balance Overall balance assessment: Needs assistance Sitting-balance support: Bilateral upper extremity supported;Feet supported Sitting balance-[name]: Poor Sitting balance - Comments: varying between CGA to mod assist d/t L lateral lean (d/t R hip/thigh pain) Postural control: Left lateral lean Standing balance support: Bilateral upper extremity supported Standing balance-[name]: Poor Standing balance comment: post lean during turning to bed.  +2 assist to maintain upright                             Cognition Arousal/Alertness: Awake/alert Behavior During Therapy: WFL for tasks assessed/performed Overall Cognitive Status: Within Functional Limits for tasks assessed                                        Exercises Total Joint Exercises Ankle Circles/Pumps: AROM;Strengthening;Both;10 reps;Supine Quad Sets: Strengthening;Both;10 reps;Supine Short Arc Quad: AAROM;Strengthening;Right;10 reps;Supine Heel  Slides: AAROM;Strengthening;Right;10 reps;Supine Hip ABduction/ADduction: AAROM;Strengthening;Right;10 reps;Supine Straight Leg Raises: AAROM;Strengthening;Right;10 reps;Supine    General Comments General comments (skin integrity, edema, etc.): Pt resting in bed upon PT entry; wife  present.      Pertinent Vitals/Pain Pain Assessment: 0-10 Pain Score: 4  Pain Descriptors / Indicators: Burning;Operative site guarding Pain Intervention(s): Limited activity within patient's tolerance;Monitored during session    [place] expects to be discharged to:: Private residence Living Arrangements: Spouse/significant other Available Help at Discharge: Family Type of Home: House Home Access: Stairs to enter Entrance Stairs-Rails: Right;Left;Can reach both Home Layout: Two level;Able to live on main level with bedroom/bathroom Home Equipment: Shower seat;Grab bars - tub/shower;Hand held shower head;Walker - 4 wheels;Cane - single point;Other (comment)(hemi-walker; transport chair)      Prior Function Level of Independence: Independent      Comments: Pt reports no other falls in past 6 months.   PT Goals (current goals can now be found in the care plan section) Acute Rehab PT Goals Patient Stated Goal: to get stronger PT Goal Formulation: With patient Time For Goal Achievement: 08/28/17 Potential to Achieve Goals: Good Progress towards PT goals: Progressing toward goals    Frequency    BID      PT Plan Current plan remains appropriate    Co-evaluation              AM-PAC PT "6 Clicks" Daily Activity  Outcome Measure  Difficulty turning over in bed (including adjusting bedclothes, sheets and blankets)?: Unable Difficulty moving from lying on back to sitting on the side of the bed? : Unable Difficulty sitting down on and standing up from a chair with arms (e.g., wheelchair, bedside commode, etc,.)?: Unable Help needed moving to and from a bed to chair (including a wheelchair)?: A Lot Help needed walking in hospital room?: Total Help needed climbing 3-5 steps with a railing? : Total 6 Click Score: 7    End of Session Equipment Utilized During Treatment: Gait belt Activity Tolerance: Patient tolerated treatment well Patient left: in  bed;with bed alarm set;with call bell/phone within reach Nurse Communication: Mobility status;Precautions;Weight bearing status PT Visit Diagnosis: Other abnormalities of gait and mobility (R26.89);Muscle weakness (generalized) (M62.81);History of falling (Z91.81);Pain Pain - Right/Left: Right Pain - part of body: Hip     Time: 1422-1436 PT Time Calculation (min) (ACUTE ONLY): 14 min  Charges:  $Therapeutic Exercise: 8-22 mins $Therapeutic Activity: 8-22 mins                    G Codes:  Functional Assessment Tool Used: AM-PAC 6 Clicks Basic Mobility Functional Limitation: Mobility: Walking and moving around Mobility: Walking and Moving Around Current Status (G6440): At least 80 percent but less than 100 percent impaired, limited or restricted Mobility: Walking and Moving Around Goal Status [phone]): At least 1 percent but less than 20 percent impaired, limited or restricted     Chesley Noon, PTA 08/14/17, 2:42 PM

## 2017-08-15 ENCOUNTER — Encounter: Payer: Self-pay | Admitting: Specialist

## 2017-08-15 ENCOUNTER — Inpatient Hospital Stay (HOSPITAL_COMMUNITY)
Admit: 2017-08-15 | Discharge: 2017-08-15 | Disposition: A | Discharge status: Home / Self Care | Payer: Medicare Other | Attending: Internal Medicine | Admitting: Internal Medicine

## 2017-08-15 DIAGNOSIS — I340 Nonrheumatic mitral (valve) insufficiency: Secondary | ICD-10-CM

## 2017-08-15 LAB — BASIC METABOLIC PANEL
Anion gap: 6 (ref 5–15)
BUN: 22 mg/dL — ABNORMAL HIGH (ref 6–20)
CHLORIDE: 102 mmol/L (ref 101–111)
CO2: 27 mmol/L (ref 22–32)
Calcium: 8.3 mg/dL — ABNORMAL LOW (ref 8.9–10.3)
Creatinine, Ser: 1.06 mg/dL (ref 0.61–1.24)
GFR calc non Af Amer: >60 mL/min (ref >60)
Glucose, Bld: 150 mg/dL — ABNORMAL HIGH (ref 65–99)
POTASSIUM: 4 mmol/L (ref 3.5–5.1)
SODIUM: 135 mmol/L (ref 135–145)

## 2017-08-15 LAB — CBC
HEMATOCRIT: 20.8 % — AB (ref 40.0–52.0)
HEMOGLOBIN: 7.5 g/dL — AB (ref 13.0–18.0)
MCH: 33.6 pg (ref 26.0–34.0)
MCHC: 35.9 g/dL (ref 32.0–36.0)
MCV: 93.5 fL (ref 80.0–100.0)
Platelets: 172 10*3/uL (ref 150–440)
RBC: 2.23 MIL/uL — AB (ref 4.40–5.90)
RDW: 22.6 % — ABNORMAL HIGH (ref 11.5–14.5)
WBC: 12.6 10*3/uL — ABNORMAL HIGH (ref 3.8–10.6)

## 2017-08-15 LAB — HEMOGLOBIN AND HEMATOCRIT, BLOOD
HCT: 26.7 % — ABNORMAL LOW (ref 40.0–52.0)
Hemoglobin: 9.6 g/dL — ABNORMAL LOW (ref 13.0–18.0)

## 2017-08-15 LAB — ECHOCARDIOGRAM COMPLETE
HEIGHTINCHES: 67 in
Weight: 2052.8 oz

## 2017-08-15 LAB — ABO/RH: ABO/RH(D): A POS

## 2017-08-15 LAB — PREPARE RBC (CROSSMATCH)

## 2017-08-15 MED ORDER — METOPROLOL TARTRATE 25 MG PO TABS
12.5000 mg | ORAL_TABLET | Freq: Three times a day (TID) | ORAL | Status: DC
  Administered 2017-08-15 – 2017-08-16 (×4): 12.5 mg via ORAL
  Filled 2017-08-15 (×4): qty 1

## 2017-08-15 MED ORDER — DILTIAZEM HCL ER COATED BEADS 120 MG PO CP24
120.0000 mg | ORAL_CAPSULE | Freq: Every day | ORAL | Status: DC
  Filled 2017-08-15: qty 1

## 2017-08-15 MED ORDER — ACETAMINOPHEN 325 MG PO TABS
650.0000 mg | ORAL_TABLET | Freq: Once | ORAL | Status: AC
  Administered 2017-08-15: 650 mg via ORAL

## 2017-08-15 MED ORDER — SODIUM CHLORIDE 0.9 % IV SOLN
Freq: Once | INTRAVENOUS | Status: AC
  Administered 2017-08-15: 14:00:00 via INTRAVENOUS

## 2017-08-15 NOTE — Progress Notes (Signed)
PT Cancellation Note  Patient Details Name: Cash Duce MRN: [id number] DOB: 07/06/1931   Cancelled Treatment:    Reason Eval/Treat Not Completed: Other (comment).  Upon arrival to pt's room, nursing present and reporting just starting blood transfusion.  D/t this, will hold PT at this time and re-attempt PT treatment session tomorrow as medically appropriate.  Leitha Bleak, PT 08/15/17, 2:12 PM [phone]

## 2017-08-15 NOTE — Care Management Note (Signed)
Case Management Note  Patient Details  Name: Eric Larsen MRN: [id number] Date of Birth: 08-Jul-1931  Subjective/Objective:   Barrier to discharge hgb=7.5. Given 1 unit PRBC.                  Action/Plan:   Expected Discharge Date:  08/16/17               Expected Discharge Plan:     In-House Referral:     Discharge planning Services     Post Acute Care Choice:    Choice offered to:     DME Arranged:    DME Agency:     HH Arranged:    HH Agency:     Status of Service:     If discussed at [employer], dates discussed:    Additional Comments:  Skai Lickteig A, RN 08/15/2017, 4:39 PM

## 2017-08-15 NOTE — Progress Notes (Signed)
Patient ID: Eric Larsen, male   DOB: [birth year], 83 y.o.   MRN: [id number]    [facility] PROGRESS NOTE  Eric Larsen SWF:[id number] DOB: [birth year] DOA: 08/12/2017 PCP: [name], MD  HPI/Subjective: Patient seen this morning.  Patient consented to blood transfusion with hemoglobin down to 7.5.  Patient again had episodes of SVT last night.  Objective: Vitals:   08/15/17 1345 08/15/17 1415  BP: 103/63 100/61  Pulse: 90 98  Resp: 16 16  Temp: 98 F (36.7 C) 98.3 F (36.8 C)  SpO2: 100% 100%    Filed Weights   08/12/17 1447 08/12/17 1919  Weight: 59 kg (130 lb) 58.2 kg (128 lb 4.8 oz)    ROS: Review of Systems  Constitutional: Negative for chills and fever.  Eyes: Negative for blurred vision.  Respiratory: Negative for cough and shortness of breath.   Cardiovascular: Negative for chest pain and palpitations.  Gastrointestinal: Negative for abdominal pain, constipation, diarrhea, nausea and vomiting.  Genitourinary: Negative for dysuria.  Musculoskeletal: Positive for joint pain.  Neurological: Negative for dizziness and headaches.   Exam: Physical Exam  Constitutional: He is oriented to person, place, and time.  HENT:  Nose: No mucosal edema.  Mouth/Throat: No oropharyngeal exudate or posterior oropharyngeal edema.  Eyes: Conjunctivae, EOM and lids are normal. Pupils are equal, round, and reactive to light.  Neck: No JVD present. Carotid bruit is not present. No edema present. No thyroid mass and no thyromegaly present.  Cardiovascular: S1 normal and S2 normal. Tachycardia present. Exam reveals no gallop.  No murmur heard. Pulses:      Dorsalis pedis pulses are 2+ on the right side, and 2+ on the left side.  Respiratory: No respiratory distress. He has no wheezes. He has no rhonchi. He has no rales.  GI: Soft. Bowel sounds are normal. There is no tenderness.  Musculoskeletal:       Right ankle: He exhibits no swelling.       Left  ankle: He exhibits no swelling.  Lymphadenopathy:    He has no cervical adenopathy.  Neurological: He is alert and oriented to person, place, and time. No cranial nerve deficit.  Sensation right foot and able to flex and extend and ankle.  Skin: Skin is warm. No rash noted. Nails show no clubbing.  Psychiatric: He has a normal mood and affect.      Data Reviewed: Basic Metabolic Panel: Recent Labs  Lab 08/12/17 1521 08/13/17 0412 08/14/17 0312 08/15/17 0512  NA 139 138 132* 135  K 4.0 4.2 3.9 4.0  CL 104 105 101 102  CO2 30 29 26 27   GLUCOSE 192* 142* 165* 150*  BUN 25* 21* 18 22*  CREATININE 0.98 0.96 0.90 1.06  CALCIUM 9.2 9.0 8.1* 8.3*   CBC: Recent Labs  Lab 08/12/17 1521 08/13/17 0412 08/13/17 2123 08/14/17 0312 08/15/17 0512  WBC 9.9 12.0* 15.8* 12.2* 12.6*  HGB 12.0* 10.0* 9.5* 8.4* 7.5*  HCT 33.9* 28.3* 27.0* 23.7* 20.8*  MCV 96.6 95.8 95.2 95.3 93.5  PLT 226 194 179 168 172     Recent Results (from the past 240 hour(s))  MRSA PCR Screening     Status: None   Collection Time: 08/12/17  9:17 PM  Result Value Ref Range Status   MRSA by PCR NEGATIVE NEGATIVE Final    Comment:        The GeneXpert MRSA Assay (FDA approved for NASAL specimens only), is one component of a comprehensive MRSA colonization surveillance  program. It is not intended to diagnose MRSA infection nor to guide or monitor treatment for MRSA infections. Performed at [facility], [address] [place], [place] [address]      Studies: Dg C-arm 19-166 Min  Result Date: 08/13/2017 CLINICAL DATA:  Hip fracture fixation EXAM: DG C-ARM 61-120 MIN; RIGHT FEMUR 2 VIEWS COMPARISON:  08/12/2017 FINDINGS: Right intertrochanteric femur fracture has been fixed with a screw and rod. Fracture alignment satisfactory. Hardware in satisfactory position. IMPRESSION: Satisfactory surgical fixation of intertrochanteric fracture on the right. Electronically Signed   By: Franchot Gallo  M.D.   On: 08/13/2017 17:37   Dg Femur, Min 2 Views Right  Result Date: 08/13/2017 CLINICAL DATA:  Hip fracture fixation EXAM: DG C-ARM 61-120 MIN; RIGHT FEMUR 2 VIEWS COMPARISON:  08/12/2017 FINDINGS: Right intertrochanteric femur fracture has been fixed with a screw and rod. Fracture alignment satisfactory. Hardware in satisfactory position. IMPRESSION: Satisfactory surgical fixation of intertrochanteric fracture on the right. Electronically Signed   By: Franchot Gallo M.D.   On: 08/13/2017 17:37    Scheduled Meds: . diltiazem  120 mg Oral Daily  . enoxaparin (LOVENOX) injection  40 mg Subcutaneous Q24H  . ferrous sulfate  325 mg Oral Q breakfast  . levothyroxine  112 mcg Oral QAC breakfast  . metoprolol tartrate  12.5 mg Oral TID  . senna  1 tablet Oral BID   Continuous Infusions:   Assessment/Plan:  1. Episode of SVT.  I increased metoprolol 12.5 mg 3 times daily.  Since patient still had episodes of fast heart rate I will add Cardizem CD 120 mg daily.  Echocardiogram normal. 2. Postoperative anemia.  Transfuse 1 unit of packed red blood cells for hemoglobin of 7.5.  Check CBC tomorrow morning. 3. Intertrochanteric fracture of the right proximal femur requiring operative repair.  Likely out to rehab Monday.  As needed pain control.  Lovenox for DVT prophylaxis 4. Hypothyroidism unspecified on levothyroxine 5. History of osteoporosis 6. B12 deficiency   Code Status:     Code Status Orders  (From admission, onward)        Start     Ordered   08/12/17 1921  Full code  Continuous     08/12/17 1920    Code Status History    Date Active Date Inactive Code Status Order ID Comments User Context   This patient has a current code status but no historical code status.    Advance Directive Documentation     Most Recent Value  Type of Advance Directive  Living will  Pre-existing out of facility DNR order (yellow form or pink MOST form)  No data  "MOST" Form in Place?  No data       Disposition Plan: To rehab but I would like to control heart rate first.  Consultants:  Orthopedic surgery  Time spent: 25 minutes  [place]

## 2017-08-15 NOTE — Anesthesia Postprocedure Evaluation (Signed)
Anesthesia Post Note  Patient: Eric Larsen  Procedure(s) Performed: INTRAMEDULLARY (IM) NAIL INTERTROCHANTRIC (Right Hip)  Patient location during evaluation: PACU Anesthesia Type: Spinal Level of consciousness: oriented and awake and alert Pain management: pain level controlled Vital Signs Assessment: post-procedure vital signs reviewed and stable Respiratory status: spontaneous breathing, respiratory function stable and patient connected to nasal cannula oxygen Cardiovascular status: blood pressure returned to baseline and stable Postop Assessment: no headache, no backache and no apparent nausea or vomiting Anesthetic complications: no     Last Vitals:  Vitals:   08/15/17 0443 08/15/17 0750  BP: (!) 98/56 (!) 114/58  Pulse:  96  Resp:  16  Temp:  36.6 C  SpO2:  94%    Last Pain:  Vitals:   08/15/17 0750  TempSrc: Oral  PainSc: 4                  Molli Barrows

## 2017-08-15 NOTE — Progress Notes (Signed)
PT Cancellation Note  Patient Details Name: Eric Larsen MRN: [id number] DOB: 12-Dec-1930   Cancelled Treatment:    Reason Eval/Treat Not Completed: Patient at procedure or test/unavailable.  Pt currently not in room (off floor for testing).  Will re-attempt PT treatment at a later date/time as medically appropriate.  Leitha Bleak, PT 08/15/17, 11:47 AM [phone]

## 2017-08-15 NOTE — Progress Notes (Signed)
Central tele called to notify pt running between 90-140's, nonsustained. Pt also has questions about his blood conditions and receiving blood. MD Leslye Peer notified, MD talked to pt via phone and answered his questions.

## 2017-08-15 NOTE — Progress Notes (Signed)
  Subjective:  Patient reports pain as mild.    Objective:   VITALS:   Vitals:   08/14/17 1925 08/15/17 0441 08/15/17 0443 08/15/17 0750  BP: (!) 103/55 (!) 91/37 (!) 98/56 (!) 114/58  Pulse: 85 84  96  Resp: 15 16  16   Temp: 98.3 F (36.8 C) 98.9 F (37.2 C)  97.9 F (36.6 C)  TempSrc: Oral Oral  Oral  SpO2: 97% 95%  94%  Weight:      Height:        PHYSICAL EXAM:  Sensation intact distally Dorsiflexion/Plantar flexion intact Incision: scant drainage  LABS  Results for orders placed or performed during the hospital encounter of 08/12/17 (from the past 24 hour(s))  CBC     Status: Abnormal   Collection Time: 08/15/17  5:12 AM  Result Value Ref Range   WBC 12.6 (H) 3.8 - 10.6 K/uL   RBC 2.23 (L) 4.40 - 5.90 MIL/uL   Hemoglobin 7.5 (L) 13.0 - 18.0 g/dL   HCT 20.8 (L) 40.0 - 52.0 %   MCV 93.5 80.0 - 100.0 fL   MCH 33.6 26.0 - 34.0 pg   MCHC 35.9 32.0 - 36.0 g/dL   RDW 22.6 (H) 11.5 - 14.5 %   Platelets 172 150 - 440 K/uL  Basic metabolic panel     Status: Abnormal   Collection Time: 08/15/17  5:12 AM  Result Value Ref Range   Sodium 135 135 - 145 mmol/L   Potassium 4.0 3.5 - 5.1 mmol/L   Chloride 102 101 - 111 mmol/L   CO2 27 22 - 32 mmol/L   Glucose, Bld 150 (H) 65 - 99 mg/dL   BUN 22 (H) 6 - 20 mg/dL   Creatinine, Ser 1.06 0.61 - 1.24 mg/dL   Calcium 8.3 (L) 8.9 - 10.3 mg/dL   GFR calc non Af Amer >60 >60 mL/min   GFR calc Af Amer >60 >60 mL/min   Anion gap 6 5 - 15    Dg C-arm 61-120 Min  Result Date: 08/13/2017 CLINICAL DATA:  Hip fracture fixation EXAM: DG C-ARM 61-120 MIN; RIGHT FEMUR 2 VIEWS COMPARISON:  08/12/2017 FINDINGS: Right intertrochanteric femur fracture has been fixed with a screw and rod. Fracture alignment satisfactory. Hardware in satisfactory position. IMPRESSION: Satisfactory surgical fixation of intertrochanteric fracture on the right. Electronically Signed   By: Franchot Gallo M.D.   On: 08/13/2017 17:37   Dg Femur, Min 2 Views  Right  Result Date: 08/13/2017 CLINICAL DATA:  Hip fracture fixation EXAM: DG C-ARM 61-120 MIN; RIGHT FEMUR 2 VIEWS COMPARISON:  08/12/2017 FINDINGS: Right intertrochanteric femur fracture has been fixed with a screw and rod. Fracture alignment satisfactory. Hardware in satisfactory position. IMPRESSION: Satisfactory surgical fixation of intertrochanteric fracture on the right. Electronically Signed   By: Franchot Gallo M.D.   On: 08/13/2017 17:37    Assessment/Plan: 2 Days Post-Op   Active Problems:   Hip fracture ([place])   Up with therapy Discharge to SNF when medically stable. May benefit from PRBC transfusion today. May discharge on ASA for DVT prophylaxis   Lovell Sheehan , MD 08/15/2017, 8:16 AM

## 2017-08-16 ENCOUNTER — Encounter: Payer: Self-pay | Admitting: Radiology

## 2017-08-16 ENCOUNTER — Inpatient Hospital Stay: Payer: Medicare Other

## 2017-08-16 LAB — TYPE AND SCREEN
ABO/RH(D): A POS
ANTIBODY SCREEN: NEGATIVE
Unit division: 0

## 2017-08-16 LAB — CBC
HEMATOCRIT: 25.4 % — AB (ref 40.0–52.0)
HEMOGLOBIN: 9.3 g/dL — AB (ref 13.0–18.0)
MCH: 33 pg (ref 26.0–34.0)
MCHC: 36.5 g/dL — AB (ref 32.0–36.0)
MCV: 90.4 fL (ref 80.0–100.0)
Platelets: 171 10*3/uL (ref 150–440)
RBC: 2.81 MIL/uL — AB (ref 4.40–5.90)
RDW: 20.6 % — ABNORMAL HIGH (ref 11.5–14.5)
WBC: 11.9 10*3/uL — ABNORMAL HIGH (ref 3.8–10.6)

## 2017-08-16 LAB — BPAM RBC
BLOOD PRODUCT EXPIRATION DATE: 201901132359
ISSUE DATE / TIME: 201812301345
UNIT TYPE AND RH: 6200

## 2017-08-16 MED ORDER — POLYETHYLENE GLYCOL 3350 17 G PO PACK: 17.0000 g | PACK | Freq: Every day | ORAL | Status: DC

## 2017-08-16 MED ORDER — SIMETHICONE 80 MG PO CHEW
80.0000 mg | CHEWABLE_TABLET | Freq: Four times a day (QID) | ORAL | Status: DC | PRN
  Administered 2017-08-16: 80 mg via ORAL
  Filled 2017-08-16 (×2): qty 1

## 2017-08-16 MED ORDER — HYDROCODONE-ACETAMINOPHEN 5-325 MG PO TABS: 1.0000 | ORAL_TABLET | ORAL | 0 refills | Status: DC | PRN

## 2017-08-16 MED ORDER — IOPAMIDOL (ISOVUE-370) INJECTION 76%
75.0000 mL | Freq: Once | INTRAVENOUS | Status: AC | PRN
  Administered 2017-08-16: 75 mL via INTRAVENOUS

## 2017-08-16 MED ORDER — POLYETHYLENE GLYCOL 3350 17 G PO PACK: 17.0000 g | PACK | Freq: Every day | ORAL | 0 refills | Status: DC

## 2017-08-16 MED ORDER — ACETAMINOPHEN 325 MG PO TABS: 650.0000 mg | ORAL_TABLET | Freq: Four times a day (QID) | ORAL | Status: DC | PRN

## 2017-08-16 MED ORDER — METOPROLOL TARTRATE 25 MG PO TABS
12.5000 mg | ORAL_TABLET | Freq: Two times a day (BID) | ORAL | 0 refills | Status: DC
Start: 2017-08-16 — End: 2019-10-18

## 2017-08-16 MED ORDER — ENOXAPARIN SODIUM 40 MG/0.4ML ~~LOC~~ SOLN: 40.0000 mg | SUBCUTANEOUS | 0 refills | Status: DC

## 2017-08-16 MED ORDER — SENNA 8.6 MG PO TABS: 1.0000 | ORAL_TABLET | Freq: Two times a day (BID) | ORAL | 0 refills | Status: DC

## 2017-08-16 MED ORDER — METOPROLOL TARTRATE 25 MG PO TABS: 12.5000 mg | ORAL_TABLET | Freq: Two times a day (BID) | ORAL | Status: DC

## 2017-08-16 MED ORDER — FERROUS SULFATE 325 (65 FE) MG PO TABS: 325.0000 mg | ORAL_TABLET | Freq: Every day | ORAL | 0 refills | Status: DC

## 2017-08-16 NOTE — Progress Notes (Signed)
EMS called for transportation.  

## 2017-08-16 NOTE — Progress Notes (Signed)
Clinical Education officer, museum (CSW) presented [facility] SN[facility] bed offers to patient's wife Eric Larsen. She chose [facility] in [place]. Patient is medically stable for D/C to [facility] today. Per [facility] admissions coordinator at [facility] they can accept patient after lunch to room 604-B. RN will call report and arrange EMS for transport. Patient and his wife understand that he will go into a semi-private room but is on the waiting list for a private room. CSW sent D/C orders to [facility] via [facility]. Patient and is wife are aware of above. Please reconsult if future social work needs arise. CSW signing off.   [employer], LCSW [phone]

## 2017-08-16 NOTE — Progress Notes (Signed)
EMS here to transport pt. 

## 2017-08-16 NOTE — Discharge Summary (Signed)
Eric Larsen at [facility] NAME: Eric Larsen    MR#:  [id number]  DATE OF BIRTH:  1930/12/22  DATE OF ADMISSION:  08/12/2017 ADMITTING PHYSICIAN: Bettey Costa, MD  DATE OF DISCHARGE: 08/16/2017  PRIMARY CARE PHYSICIAN: Mickel Crow, Gifford Shave, MD    ADMISSION DIAGNOSIS:  Closed fracture of right hip, initial encounter ([place]) [S72.001A]  DISCHARGE DIAGNOSIS:  Active Problems:   Hip fracture ([place])   SECONDARY DIAGNOSIS:   Past Medical History:  Diagnosis Date  . Hypothyroidism   . Osteoporosis   . Thyroid disease     HOSPITAL COURSE:   1.  Intertrochanteric fracture of the right proximal femur requiring operative repair.  Please see operative report.  As needed pain control and try to switch over to Tylenol as quickly as possible.  Lovenox for DVT prophylaxis for another 14 days. 2.  Postoperative anemia requiring transfusion on a hemoglobin of 7.5.  Hemoglobin came up to 9.3 upon discharge home.  Prescribed iron daily. 3.  Episodes of SVT.  This happened postoperatively and when he was anemic.  Has not had a repeat episode of SVT since 1 PM yesterday while receiving blood.  Continue metoprolol 12.5 mg twice daily.  Echocardiogram showed a normal EF.  CT scan of the chest was negative for pulmonary embolism. 4.  Pulmonary nodule seen on CT scan.  Recommend a repeat CT scan in 6 months for further evaluation.  This can be done with primary care physician. 5.  Hypothyroidism unspecified on levothyroxine 6.  B12 deficiency on supplementation 7.  Osteoporosis history.  Can consider Fosamax as outpatient  8.  Constipation.  Start MiraLAX and continue senna  DISCHARGE CONDITIONS:   Satisfactory  CONSULTS OBTAINED:  Treatment Team:  Earnestine Leys, MD  DRUG ALLERGIES:  No Known Allergies  DISCHARGE MEDICATIONS:   Allergies as of 08/16/2017   No Known Allergies     Medication List    TAKE these medications   acetaminophen 325  MG tablet Commonly known as:  TYLENOL Take 2 tablets (650 mg total) by mouth every 6 (six) hours as needed for mild pain or headache (or Fever >/= 101).   enoxaparin 40 MG/0.4ML injection Commonly known as:  LOVENOX Inject 0.4 mLs (40 mg total) into the skin daily for 14 days. Start taking on:  08/17/2017   ferrous sulfate 325 (65 FE) MG tablet Take 1 tablet (325 mg total) by mouth daily with breakfast. Start taking on:  4/0/81   Folic Acid-Vit B1-YNW G95 2.5-25-1 MG Tabs tablet Commonly known as:  FOLBEE Take 1 tablet by mouth daily.   HYDROcodone-acetaminophen 5-325 MG tablet Commonly known as:  NORCO/VICODIN Take 1 tablet by mouth every 4 (four) hours as needed for moderate pain or severe pain.   metoprolol tartrate 25 MG tablet Commonly known as:  LOPRESSOR Take 0.5 tablets (12.5 mg total) by mouth 2 (two) times daily.   polyethylene glycol packet Commonly known as:  MIRALAX / GLYCOLAX Take 17 g by mouth daily.   senna 8.6 MG Tabs tablet Commonly known as:  SENOKOT Take 1 tablet (8.6 mg total) by mouth 2 (two) times daily.   SYNTHROID 112 MCG tablet Generic drug:  levothyroxine Take 112 mcg by mouth daily before breakfast.        DISCHARGE INSTRUCTIONS:   follow up doctor at rehab one day  If you experience worsening of your admission symptoms, develop shortness of breath, life threatening emergency, suicidal or homicidal thoughts you must  seek medical attention immediately by calling 911 or calling your MD immediately  if symptoms less severe.  You Must read complete instructions/literature along with all the possible adverse reactions/side effects for all the Medicines you take and that have been prescribed to you. Take any new Medicines after you have completely understood and accept all the possible adverse reactions/side effects.   Please note  You were cared for by a hospitalist during your hospital stay. If you have any questions about your discharge  medications or the care you received while you were in the hospital after you are discharged, you can call the unit and asked to speak with the hospitalist on call if the hospitalist that took care of you is not available. Once you are discharged, your primary care physician will handle any further medical issues. Please note that NO REFILLS for any discharge medications will be authorized once you are discharged, as it is imperative that you return to your primary care physician (or establish a relationship with a primary care physician if you do not have one) for your aftercare needs so that they can reassess your need for medications and monitor your lab values.    Today   CHIEF COMPLAINT:   Chief Complaint  Patient presents with  . Hip Pain    HISTORY OF PRESENT ILLNESS:  Eric Larsen  is a 83 y.o. male with a known history of came in after fall while walking dog   VITAL SIGNS:  Blood pressure 104/74, pulse 82, temperature 98.2 F (36.8 C), temperature source Oral, resp. rate 16, height 5\' 7"  (1.702 m), weight 58.2 kg (128 lb 4.8 oz), SpO2 94 %.    PHYSICAL EXAMINATION:  GENERAL:  83 y.o.-year-old patient lying in the bed with no acute distress.  EYES: Pupils equal, round, reactive to light and accommodation. No scleral icterus. Extraocular muscles intact.  HEENT: Head atraumatic, normocephalic. Oropharynx and nasopharynx clear.  NECK:  Supple, no jugular venous distention. No thyroid enlargement, no tenderness.  LUNGS: Normal breath sounds bilaterally, no wheezing, rales,rhonchi or crepitation. No use of accessory muscles of respiration.  CARDIOVASCULAR: S1, S2 normal. No murmurs, rubs, or gallops.  ABDOMEN: Soft, non-tender, non-distended. Bowel sounds present. No organomegaly or mass.  EXTREMITIES: No pedal edema, cyanosis, or clubbing.  NEUROLOGIC: Cranial nerves II through XII are intact. Muscle strength 5/5 in all extremities. Sensation intact. Gait not checked.   PSYCHIATRIC: The patient is alert and oriented x 3.  SKIN: No obvious rash, lesion, or ulcer.   DATA REVIEW:   CBC Recent Labs  Lab 08/16/17 0322  WBC 11.9*  HGB 9.3*  HCT 25.4*  PLT 171    Chemistries  Recent Labs  Lab 08/15/17 0512  NA 135  K 4.0  CL 102  CO2 27  GLUCOSE 150*  BUN 22*  CREATININE 1.06  CALCIUM 8.3*     Microbiology Results  Results for orders placed or performed during the hospital encounter of 08/12/17  MRSA PCR Screening     Status: None   Collection Time: 08/12/17  9:17 PM  Result Value Ref Range Status   MRSA by PCR NEGATIVE NEGATIVE Final    Comment:        The GeneXpert MRSA Assay (FDA approved for NASAL specimens only), is one component of a comprehensive MRSA colonization surveillance program. It is not intended to diagnose MRSA infection nor to guide or monitor treatment for MRSA infections. Performed at [facility], [place], [place]  [address]     RADIOLOGY:  Ct Angio Chest Pe W Or Wo Contrast  Result Date: 08/16/2017 CLINICAL DATA:  Intermittent tachycardia, elevated D-dimer, former smoker EXAM: CT ANGIOGRAPHY CHEST WITH CONTRAST TECHNIQUE: Multidetector CT imaging of the chest was performed using the standard protocol during bolus administration of intravenous contrast. Multiplanar CT image reconstructions and MIPs were obtained to evaluate the vascular anatomy. CONTRAST:  109mL ISOVUE-370 IOPAMIDOL (ISOVUE-370) INJECTION 76% IV COMPARISON:  None FINDINGS: Cardiovascular: Atherosclerotic calcifications of aorta, proximal great vessels and coronary arteries. Aorta normal caliber without aneurysm or dissection. Mild enlargement of cardiac chambers. No pericardial effusion. Pulmonary arteries adequately opacified and patent. No evidence of pulmonary embolism. Mediastinum/Nodes: Esophagus unremarkable. Base of cervical region normal appearance. No thoracic adenopathy. Lungs/Pleura: Mild emphysematous changes.  Dependent atelectasis in the posterior lungs. Minimal scarring or atelectasis in paramediastinal RIGHT upper lobe. Calcified granuloma at base of RIGHT middle lobe image 74. Nonspecific 5 mm RIGHT lower lobe nodule near diaphragm image 65. No acute infiltrate, pleural effusion or pneumothorax. Upper Abdomen: Small cyst at mid LEFT kidney. 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 Remaining visualized upper abdomen unremarkable. Musculoskeletal: Osseous demineralization with chronic appearing endplate compression fractures of numerous thoracic vertebral bodies. Old healed mid sternal fracture. Old healed LEFT rib fractures. Review of the MIP images confirms the above findings. IMPRESSION: No evidence of pulmonary embolism. Enlargement of cardiac silhouette with extensive atherosclerotic changes including coronary arteries. 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 No follow-up needed if patient is low-risk. Non-contrast chest CT can be considered in 12 months if patient is high-risk. This recommendation follows the consensus statement: Guidelines for Management of Incidental Pulmonary Nodules Detected on CT Images: From the Fleischner Society 2017; Radiology 2017; 284:228-243. Aortic Atherosclerosis (ICD10-I70.0) and Emphysema (ICD10-J43.9). Electronically Signed   By: Lavonia Dana M.D.   On: 08/16/2017 09:07      Management plans discussed with the patient, family and they are in agreement.  CODE STATUS:     Code Status Orders  (From admission, onward)        Start     Ordered   08/12/17 1921  Full code  Continuous     08/12/17 1920    Code Status History    Date Active Date Inactive Code Status Order ID Comments User Context   This patient has a current code status but no historical code status.    Advance  Directive Documentation     Most Recent Value  Type of Advance Directive  Living will  Pre-existing out of facility DNR order (yellow form or pink MOST form)  No data  "MOST" Form in Place?  No data      TOTAL TIME TAKING CARE OF THIS PATIENT: 35 minutes.    Loletha Grayer M.D on 08/16/2017 at 9:34 AM  Between 7am to 6pm - Pager - [phone]  After 6pm go to [email or web address] - password [email or web address]  [facility] Office  [phone]  CC: Primary care physician; [name], MD

## 2017-08-16 NOTE — Clinical Social Work Placement (Signed)
   CLINICAL SOCIAL WORK PLACEMENT  NOTE  Date:  08/16/2017  Patient Details  Name: Eric Larsen MRN: [id number] Date of Birth: 1931-01-05  Clinical Social Work is seeking post-discharge placement for this patient at the [place] level of care (*CSW will initial, date and re-position this form in  chart as items are completed):  Yes   Patient/family provided with [facility] Work Department's list of facilities offering this level of care within the geographic area requested by the patient (or if unable, by the patient's family).  Yes   Patient/family informed of their freedom to choose among providers that offer the needed level of care, that participate in Medicare, Medicaid or managed care program needed by the patient, have an available bed and are willing to accept the patient.  Yes   Patient/family informed of [facility]'s ownership interest in [facility] and [facility], as well as of the fact that they are under no obligation to receive care at these facilities.  PASRR submitted to EDS on 08/13/17     PASRR number received on 08/13/17     Existing PASRR number confirmed on       FL2 transmitted to all facilities in geographic area requested by pt/family on 08/13/17     FL2 transmitted to all facilities within larger geographic area on       Patient informed that his/her managed care company has contracts with or will negotiate with certain facilities, including the following:        Yes   Patient/family informed of bed offers received.  Patient chooses bed at [facility] )     Physician recommends and patient chooses bed at      Patient to be transferred to ([facility] ) on 08/16/17.  Patient to be transferred to facility by [facility] )     Patient family notified on 08/16/17 of transfer.  Name of family member notified:  (Patient's wife [name] is at bedside and aware of D/C  today. )     PHYSICIAN       Additional Comment:    _______________________________________________ Tanaisha Pittman, Veronia Beets, LCSW 08/16/2017, 10:08 AM

## 2017-08-16 NOTE — Discharge Instructions (Signed)
Hip Fracture A hip fracture is a fracture of the upper part of your thigh bone (femur). What are the causes? A hip fracture is caused by a direct blow to the side of your hip. This is usually the result of a fall but can occur in other circumstances, such as an automobile accident. What increases the risk? There is an increased risk of hip fractures in people with:  An unsteady walking pattern (gait) and those with conditions that contribute to poor balance, such as Parkinson's disease or dementia.  Osteopenia and osteoporosis.  Cancer that spreads to the leg bones.  Certain metabolic diseases.  What are the signs or symptoms? Symptoms of hip fracture include:  Pain over the injured hip.  Inability to put weight on the leg in which the fracture occurred (although, some patients are able to walk after a hip fracture).  Toes and foot of the affected leg point outward when you lie down.  How is this diagnosed? A physical exam can determine if a hip fracture is likely to have occurred. X-ray exams are needed to confirm the fracture and to look for other injuries. The X-ray exam can help to determine the type of hip fracture. Rarely, the fracture is not visible on an X-ray image and a CT scan or MRI will have to be done. How is this treated? The treatment for a fracture is usually surgery. This means using a screw, nail, or rod to hold the bones in place. Follow these instructions at home: Take all medicines as directed by your health care provider. Contact a health care provider if: Pain continues, even after taking pain medicine. This information is not intended to replace advice given to you by your health care provider. Make sure you discuss any questions you have with your health care provider. Document Released: 08/03/2005 Document Revised: 01/09/2016 Document Reviewed: 03/15/2013 Elsevier Interactive Patient Education  2017 Elsevier Inc.  

## 2017-08-16 NOTE — Progress Notes (Signed)
Report called to [facility].

## 2017-08-16 NOTE — Progress Notes (Signed)
Physical Therapy Treatment Patient Details Name: Eric Larsen MRN: [id number] DOB: July 02, 1931 Today's Date: 08/16/2017    History of Present Illness Pt is an 83 y.o. male presenting to hospital s/p fall (pt pulled down by dog).  Imaging showing slightly displaced minimally comminuted intertrochanteric fx of proximal R femur.  Pt s/p R IMN intertrochanteric 08/13/17.  HR in 140's post-op.  PMH includes L THA and shoulder surgery.    PT Comments    Pt agreeable to PT; reports 8/10 pain R hip with movement. Pt/spouse educated and participated in supine bed exercises with written HEP provided. Pt requires assist on the right for movement, but able to demonstrate improved range with continued repetitions. Pt prepared for discharge to skilled nursing facility today to continue rehab efforts.    Follow Up Recommendations        Equipment Recommendations       Recommendations for Other Services       Precautions / Restrictions Precautions Precautions: Fall Restrictions Weight Bearing Restrictions: Yes RLE Weight Bearing: Partial weight bearing    Mobility  Bed Mobility               General bed mobility comments: Not tested  Transfers                    Ambulation/Gait                 Stairs            Wheelchair Mobility    Modified Rankin (Stroke Patients Only)       Balance                                            Cognition Arousal/Alertness: Awake/alert Behavior During Therapy: WFL for tasks assessed/performed Overall Cognitive Status: Within Functional Limits for tasks assessed                                        Exercises General Exercises - Lower Extremity Ankle Circles/Pumps: AROM;Both;Supine;20 reps Quad Sets: Strengthening;Both;15 reps;Supine Gluteal Sets: Strengthening;Both;15 reps;Supine Short Arc Quad: AROM;Strengthening;Both;15 reps;Supine Heel Slides: AAROM;Right;20  reps;Supine(AROM L) Hip ABduction/ADduction: AAROM;Both;20 reps;Supine(AROM L) Straight Leg Raises: AAROM;10 reps;Supine;Both Other Exercises Other Exercises: Given HEP    General Comments        Pertinent Vitals/Pain Pain Assessment: 0-10 Pain Score: 8  Pain Location: RLE Pain Intervention(s): Limited activity within patient's tolerance;Monitored during session    Home Living                      Prior Function            PT Goals (current goals can now be found in the care plan section) Progress towards PT goals: Progressing toward goals    Frequency    BID      PT Plan Current plan remains appropriate    Co-evaluation              AM-PAC PT "6 Clicks" Daily Activity  Outcome Measure  Difficulty turning over in bed (including adjusting bedclothes, sheets and blankets)?: Unable Difficulty moving from lying on back to sitting on the side of the bed? : Unable Difficulty sitting down on and standing up from a chair with arms (e.g., wheelchair,  bedside commode, etc,.)?: Unable Help needed moving to and from a bed to chair (including a wheelchair)?: Total Help needed walking in hospital room?: Total Help needed climbing 3-5 steps with a railing? : Total 6 Click Score: 6    End of Session   Activity Tolerance: Patient tolerated treatment well Patient left: in bed;with call bell/phone within reach;with bed alarm set;with family/visitor present;with SCD's reapplied   PT Visit Diagnosis: Other abnormalities of gait and mobility (R26.89);Muscle weakness (generalized) (M62.81);History of falling (Z91.81);Pain Pain - Right/Left: Right Pain - part of body: Hip     Time: 1026-1050 PT Time Calculation (min) (ACUTE ONLY): 24 min  Charges:  $Therapeutic Exercise: 23-37 mins                    G CodesLarae Larsen, PTA 08/16/2017, 12:41 PM

## 2017-09-07 ENCOUNTER — Encounter: Payer: Self-pay | Admitting: Physical Therapy

## 2017-09-07 ENCOUNTER — Ambulatory Visit: Payer: Medicare Other | Attending: Specialist | Admitting: Physical Therapy

## 2017-09-07 DIAGNOSIS — M25551 Pain in right hip: Secondary | ICD-10-CM | POA: Diagnosis not present

## 2017-09-07 DIAGNOSIS — R262 Difficulty in walking, not elsewhere classified: Secondary | ICD-10-CM | POA: Insufficient documentation

## 2017-09-07 DIAGNOSIS — R2689 Other abnormalities of gait and mobility: Secondary | ICD-10-CM | POA: Diagnosis present

## 2017-09-07 DIAGNOSIS — M25651 Stiffness of right hip, not elsewhere classified: Secondary | ICD-10-CM | POA: Diagnosis present

## 2017-09-07 DIAGNOSIS — R293 Abnormal posture: Secondary | ICD-10-CM | POA: Insufficient documentation

## 2017-09-07 NOTE — Patient Instructions (Signed)
Straight Leg Raise    Slowly raise locked right leg __6-8__ inches from floor. Repeat __10__ times per set. Do __1__ sets per session. Do __2-3__ sessions per day.    Continue your exercises given by the therapists at [facility].  Bring those with you next session.

## 2017-09-07 NOTE — Therapy (Signed)
[place], [place], [address] Phone: [phone]   Fax:  [phone]  Physical Therapy Evaluation  Patient Details  Name: Eric Larsen MRN: [id number] Date of Birth: 30-Nov-1930 Referring Provider: Dr Earnestine Leys (switching care to Dr. Mardelle Matte)   Encounter Date: 09/07/2017  PT End of Session - 09/07/17 1504    Visit Number  1    Number of Visits  8    Date for PT Re-Evaluation  10/05/17    Authorization Type  Medicare/BCBS    PT Start Time  1522    PT Stop Time  1557    PT Time Calculation (min)  35 min    Activity Tolerance  Patient tolerated treatment well    Behavior During Therapy  [facility] for tasks assessed/performed       Past Medical History:  Diagnosis Date  . Hypothyroidism   . Osteoporosis   . Thyroid disease     Past Surgical History:  Procedure Laterality Date  . HIP FRACTURE SURGERY    . INTRAMEDULLARY (IM) NAIL INTERTROCHANTERIC Right 08/13/2017   Procedure: INTRAMEDULLARY (IM) NAIL INTERTROCHANTRIC;  Surgeon: Earnestine Leys, MD;  Location: [facility] ORS;  Service: Orthopedics;  Laterality: Right;  . SHOULDER SURGERY      There were no vitals filed for this visit.   Subjective Assessment - 09/07/17 1425    Subjective  Pt is an 84 y/o male who presents to [place] s/p Rt hip fx on 08/12/17 with ORIF on 08/13/17.  Pt went to SNF until 08/27/17.  Pt presents today with RW and continued difficulty with mobility.  Pt reports he tripped and fell over dog resulting in fx.    Pertinent History  Lt THA    Limitations  Walking;Standing    How long can you stand comfortably?  5 min    How long can you walk comfortably?  10 min    Patient Stated Goals  improve mobility    Currently in Pain?  Yes c/o discomfort    Pain Score  0-No pain up to 6/10    Pain Location  Hip    Pain Orientation  Right    Pain Descriptors / Indicators  Discomfort;Jabbing    Pain Type  Surgical pain    Pain Onset  1 to  4 weeks ago    Pain Frequency  Intermittent    Aggravating Factors   walking, standing, worse in AM when stiff; too much activity    Pain Relieving Factors  rest, medication (tylenol)         [facility] PT Assessment - 09/07/17 1431      Assessment   Medical Diagnosis  Rt hip fx s/p ORIF    Referring Provider  Dr Earnestine Leys (switching care to Dr. Mardelle Matte)    Onset Date/Surgical Date  08/13/17    Next MD Visit  switching care to Dr. Mardelle Matte    Prior Therapy  at [facility] following injury; previously here for Lt THA and shoulder fx      Precautions   Precautions  None      Restrictions   Weight Bearing Restrictions  No      Balance Screen   Has the patient fallen in the past 6 months  Yes    How many times?  1    Has the patient had a decrease in activity level because of a fear of falling?   No    Is the patient reluctant to leave their home  because of a fear of falling?   No      Home Environment   Living Environment  Private residence    Living Arrangements  Spouse/significant other    Available Help at Discharge  Family;Available 24 hours/day    Type of Home  House    Home Access  Stairs to enter    Entrance Stairs-Number of Steps  3    Entrance Stairs-Rails  Right;Left;Can reach both    Home Layout  Two level;Able to live on main level with bedroom/bathroom      Prior Function   Level of Independence  Independent    Vocation  Retired    [employer]  retired Forensic psychologist    Leisure  reading; travel      Cognition   Overall Cognitive Status  Within Functional Limits for tasks assessed      Posture/Postural Control   Posture/Postural Control  Postural limitations    Postural Limitations  Rounded Shoulders;Forward head;Posterior pelvic tilt      ROM / Strength   AROM / PROM / Strength  PROM;Strength      PROM   PROM Assessment Site  Hip    Right/Left Hip  Right      Strength   Overall Strength Comments  extension not tested    Strength Assessment Site   Hip    Right/Left Hip  Right    Right Hip Flexion  3-/5    Right Hip ABduction  2/5      Right Hip   Right Hip Flexion  90    Right Hip ABduction  32      Palpation   Palpation comment  no significant tenderness noted      Transfers   Transfers  Sit to Stand;Stand to Sit    Sit to Stand  6: Modified independent (Device/Increase time)    Stand to Sit  6: Modified independent (Device/Increase time)      Ambulation/Gait   Ambulation/Gait  Yes    Ambulation/Gait Assistance  5: Supervision    Ambulation Distance (Feet)  150 Feet    Assistive device  Rolling walker    Gait Pattern  Decreased stance time - right;Decreased step length - left;Decreased hip/knee flexion - right;Antalgic    Gait velocity  decreased             Objective measurements completed on examination: See above findings.      [facility] Adult PT Treatment/Exercise - 09/07/17 1431      Self-Care   Self-Care  Other Self-Care Comments    Other Self-Care Comments   verbally reviewed HEP from [facility]; pt to bring next session      Exercises   Exercises  Knee/Hip      Knee/Hip Exercises: Supine   Straight Leg Raises  Right;10 reps             PT Education - 09/07/17 1504    Education provided  Yes    Education Details  SLR, continue HEP from SNF    Person(s) Educated  Patient    Methods  Demonstration;Handout;Explanation    Comprehension  Verbalized understanding;Returned demonstration          PT Long Term Goals - 09/07/17 1507      PT LONG TERM GOAL #1   Title  independent with HEP    Status  New    Target Date  10/05/17      PT LONG TERM GOAL #2   Title  amb >  250' with LRAD modified independent for improved mobility    Status  New    Target Date  10/05/17      PT LONG TERM GOAL #3   Title  pt will report ability to walk > 20 min without increase in pain for improved function    Status  New    Target Date  10/05/17      PT LONG TERM GOAL #4   Title  report pain < 2/10 with  activity for improved function    Status  New    Target Date  10/05/17      PT LONG TERM GOAL #5   Title  n/a             Plan - 09/07/17 1504    Clinical Impression Statement  Pt is an 84 y/o male who presents to [facility] s/p fall resulting in Rt hip fx with ORIF.  Pt demonstrates decreased ROM and strength as well as gait abnormalities affecting functional mobility.  Pt will benefit from PT to address deficits listed.     History and Personal Factors relevant to plan of care:  hx Lt hip fx s/p THA, osteoporosis    Clinical Presentation  Evolving    Clinical Presentation due to:  hx falls    Clinical Decision Making  Moderate    Rehab Potential  Good    PT Frequency  2x / week    PT Duration  4 weeks    PT Treatment/Interventions  ADLs/Self Care Home Management;Cryotherapy;Electrical Stimulation;Moist Heat;Therapeutic exercise;Therapeutic activities;Functional mobility training;Stair training;Gait training;DME Instruction;Ultrasound;Balance training;Neuromuscular re-education;Patient/family education;Manual techniques;Taping;Passive range of motion;Scar mobilization    PT Next Visit Plan  review HEP, add standing exercises to HEP, try gait with cane    Consulted and Agree with Plan of Care  Patient       Patient will benefit from skilled therapeutic intervention in order to improve the following deficits and impairments:  Abnormal gait, Decreased balance, Decreased range of motion, Decreased mobility, Difficulty walking, Postural dysfunction, Decreased strength, Pain  Visit Diagnosis: Pain in right hip - Plan: PT plan of care cert/re-cert  Stiffness of right hip, not elsewhere classified - Plan: PT plan of care cert/re-cert  Other abnormalities of gait and mobility - Plan: PT plan of care cert/re-cert  Abnormal posture - Plan: PT plan of care cert/re-cert  Difficulty in walking, not elsewhere classified - Plan: PT plan of care cert/re-cert     Problem List Patient Active  Problem List   Diagnosis Date Noted  . Hip fracture ([place]) 08/12/2017  . Hallux valgus of left foot 04/28/2017  . Hammer toe of left foot 04/28/2017  . Corn of toe 04/28/2017  . History of total left hip replacement 09/19/2015  . Spherocytosis (familial) ([place]) 09/17/2015  . Mixed hyperlipidemia 06/19/2015  . Benign prostatic hyperplasia with lower urinary tract symptoms 06/19/2015  . History of fracture of vertebra 11/06/2014  . History of colon polyps 08/08/2014  . Pathological fracture 11/01/2013  . Toenail fungus 01/20/2013  . Vitamin D deficiency 09/27/2012  . Primary hypothyroidism 09/27/2012  . Osteoporosis 09/27/2012  . Spherocytosis ([place]) 12/04/2011  . Skin cancer 12/04/2011     Laureen Abrahams, PT, DPT 09/07/17 3:17 PM    [facility] [address] [place], [place], [address] Phone: [phone]   Fax:  [phone]  Name: Eric Larsen MRN: [id number] Date of Birth: [birth year]

## 2017-09-09 ENCOUNTER — Encounter: Payer: Self-pay | Admitting: Physical Therapy

## 2017-09-09 ENCOUNTER — Ambulatory Visit: Payer: Medicare Other | Admitting: Physical Therapy

## 2017-09-09 DIAGNOSIS — R2689 Other abnormalities of gait and mobility: Secondary | ICD-10-CM

## 2017-09-09 DIAGNOSIS — M25551 Pain in right hip: Secondary | ICD-10-CM

## 2017-09-09 DIAGNOSIS — R262 Difficulty in walking, not elsewhere classified: Secondary | ICD-10-CM

## 2017-09-09 DIAGNOSIS — R293 Abnormal posture: Secondary | ICD-10-CM

## 2017-09-09 DIAGNOSIS — M25651 Stiffness of right hip, not elsewhere classified: Secondary | ICD-10-CM

## 2017-09-09 NOTE — Patient Instructions (Addendum)
Knee High   Holding stable object, raise knee to hip level, then lower knee. Repeat with other knee. Complete __10_ repetitions. Do __2__ sessions per day.  ABDUCTION: Standing (Active)   Stand, feet flat. Lift right leg out to side. Use _0__ lbs. Complete __10_ repetitions. Perform __2_ sessions per day.  ADDUCTION: Standing - Stable (Active)   Stand, right leg out to side as far as possible. Draw leg in across midline. Use _0__ lbs. Complete 10_ repetitions. Perform _2__ sessions per day.       EXTENSION: Standing (Active)  Stand, both feet flat. Draw right leg behind body as far as possible. Use 0___ lbs. Complete 10 repetitions. Perform __2_ sessions per day.  Copyright  VHI. All rights reserved.  Bridge   Lie back, legs bent. Inhale, pressing hips up. Keeping ribs in, lengthen lower back. Exhale, rolling down along spine from top. Repeat _10 x 2___ times. Do __1-2__ sessions per day.      Voncille Lo, PT Certified Exercise Expert for the Aging Adult  09/09/17 8:58 AM Phone: [phone] Fax: [phone]

## 2017-09-09 NOTE — Therapy (Signed)
[place], [place], [address] Phone: [phone]   Fax:  [phone]  Physical Therapy Treatment  Patient Details  Name: Eric Larsen MRN: [id number] Date of Birth: 18-Apr-1931 Referring Provider: Dr Earnestine Leys (switching care to Dr. Mardelle Matte)   Encounter Date: 09/09/2017  PT End of Session - 09/09/17 0942    Visit Number  2    Number of Visits  8    Date for PT Re-Evaluation  10/05/17    Authorization Type  Medicare/BCBS    PT Start Time  [phone] arrived late to clinic due to weather   PT Stop Time  0933    PT Time Calculation (min)  41 min    Activity Tolerance  Patient tolerated treatment well    Behavior During Therapy  [facility] for tasks assessed/performed       Past Medical History:  Diagnosis Date  . Hypothyroidism   . Osteoporosis   . Thyroid disease     Past Surgical History:  Procedure Laterality Date  . HIP FRACTURE SURGERY    . INTRAMEDULLARY (IM) NAIL INTERTROCHANTERIC Right 08/13/2017   Procedure: INTRAMEDULLARY (IM) NAIL INTERTROCHANTRIC;  Surgeon: Earnestine Leys, MD;  Location: [facility] ORS;  Service: Orthopedics;  Laterality: Right;  . SHOULDER SURGERY      There were no vitals filed for this visit.  Subjective Assessment - 09/09/17 0856    Pain Score  3     Pain Location  -- discomfort    Pain Orientation  Right    Pain Descriptors / Indicators  Discomfort    Pain Type  Surgical pain    Pain Onset  1 to 4 weeks ago    Pain Frequency  Intermittent                      [facility] Adult PT Treatment/Exercise - 09/09/17 0856      Ambulation/Gait   Assistive device  Rolling walker    Gait Pattern  Decreased stance time - right;Decreased step length - left;Decreased hip/knee flexion - right;Antalgic    Gait velocity  1.83 ft/sec    Gait Comments  Pt with decreased hip flexion,  worked on stride length but walker limits .  reinforced heel toe gait      Exercises   Exercises  Knee/Hip      Knee/Hip Exercises: Standing   Other Standing Knee Exercises  4 way SLR right and left x 10 each  VC for slowing down and utilizing motor control    Other Standing Knee Exercises  standing kinetic chain against wall with UE flexion and right and then left x 10 to pt tolerance   Pt with limited left UE flexion / old humeral fx shattered      Knee/Hip Exercises: Supine   Bridges  10 reps;2 sets    Bridges Limitations  pt performs with 1/2 range    Straight Leg Raises  15 reps;Right;Strengthening x 1 no weight  x 1 with 2 lb             PT Education - 09/09/17 0908    Education provided  Yes    Education Details  added to HEP bridging and 4 way SLR     Person(s) Educated  Patient;Spouse    Methods  Explanation;Demonstration;Tactile cues;Verbal cues;Handout    Comprehension  Verbalized understanding;Returned demonstration          PT Long Term Goals - 09/07/17 1507  PT LONG TERM GOAL #1   Title  independent with HEP    Status  New    Target Date  10/05/17      PT LONG TERM GOAL #2   Title  amb > 250' with LRAD modified independent for improved mobility    Status  New    Target Date  10/05/17      PT LONG TERM GOAL #3   Title  pt will report ability to walk > 20 min without increase in pain for improved function    Status  New    Target Date  10/05/17      PT LONG TERM GOAL #4   Title  report pain < 2/10 with activity for improved function    Status  New    Target Date  10/05/17      PT LONG TERM GOAL #5   Title  n/a            Plan - 09/09/17 0946    Clinical Impression Statement  Pt enters clinic with rolling walker accompanied by wife.  Pt was late due to rain. Pt is compliant and eagerly performs all exercises.  Pt does demonstrate decreased ability to stand only on right LE but is able UE support to perform standing exercises.  Pt will benefit from strengthening or bil LE before progressing to gait with cane.     Rehab  Potential  Good    PT Frequency  2x / week    PT Duration  4 weeks    PT Treatment/Interventions  ADLs/Self Care Home Management;Cryotherapy;Electrical Stimulation;Moist Heat;Therapeutic exercise;Therapeutic activities;Functional mobility training;Stair training;Gait training;DME Instruction;Ultrasound;Balance training;Neuromuscular re-education;Patient/family education;Manual techniques;Taping;Passive range of motion;Scar mobilization    PT Next Visit Plan  review HEP, may add weights to HEP as performed in clinic, try gait with cane    PT Home Exercise Plan  4 way standing SLR, bridge, SLR,      Consulted and Agree with Plan of Care  Patient;Family member/caregiver       Patient will benefit from skilled therapeutic intervention in order to improve the following deficits and impairments:  Abnormal gait, Decreased balance, Decreased range of motion, Decreased mobility, Difficulty walking, Postural dysfunction, Decreased strength, Pain  Visit Diagnosis: Pain in right hip  Stiffness of right hip, not elsewhere classified  Other abnormalities of gait and mobility  Abnormal posture  Difficulty in walking, not elsewhere classified     Problem List Patient Active Problem List   Diagnosis Date Noted  . Hip fracture ([place]) 08/12/2017  . Hallux valgus of left foot 04/28/2017  . Hammer toe of left foot 04/28/2017  . Corn of toe 04/28/2017  . History of total left hip replacement 09/19/2015  . Spherocytosis (familial) ([place]) 09/17/2015  . Mixed hyperlipidemia 06/19/2015  . Benign prostatic hyperplasia with lower urinary tract symptoms 06/19/2015  . History of fracture of vertebra 11/06/2014  . History of colon polyps 08/08/2014  . Pathological fracture 11/01/2013  . Toenail fungus 01/20/2013  . Vitamin D deficiency 09/27/2012  . Primary hypothyroidism 09/27/2012  . Osteoporosis 09/27/2012  . Spherocytosis ([place]) 12/04/2011  . Skin cancer 12/04/2011   Voncille Lo, PT Certified  Exercise Expert for the Aging Adult  09/09/17 9:49 AM Phone: [phone] Fax: [facility] [address] [place], [place], [address] Phone: [phone]   Fax:  [phone]  Name: Eric Larsen MRN: [id number] Date of Birth: 1931-07-30

## 2017-09-14 ENCOUNTER — Ambulatory Visit: Payer: Medicare Other | Admitting: Physical Therapy

## 2017-09-16 ENCOUNTER — Encounter: Payer: Self-pay | Admitting: Physical Therapy

## 2017-09-16 ENCOUNTER — Ambulatory Visit: Payer: Medicare Other | Admitting: Physical Therapy

## 2017-09-16 DIAGNOSIS — M25551 Pain in right hip: Secondary | ICD-10-CM | POA: Diagnosis not present

## 2017-09-16 DIAGNOSIS — R2689 Other abnormalities of gait and mobility: Secondary | ICD-10-CM

## 2017-09-16 DIAGNOSIS — M25651 Stiffness of right hip, not elsewhere classified: Secondary | ICD-10-CM

## 2017-09-16 NOTE — Therapy (Signed)
[place], [place], [address] Phone: [phone]   Fax:  [phone]  Physical Therapy Treatment  Patient Details  Name: Eric Larsen MRN: [id number] Date of Birth: [birth year] Referring Provider: Dr Earnestine Leys (switching care to Dr. Mardelle Matte)   Encounter Date: 09/16/2017  PT End of Session - 09/16/17 1456    Visit Number  3    Number of Visits  8    Date for PT Re-Evaluation  10/05/17    Authorization Type  Medicare/BCBS    PT Start Time  8676 pt arrived late    PT Stop Time  1458    PT Time Calculation (min)  36 min    Activity Tolerance  Patient tolerated treatment well    Behavior During Therapy  [facility] for tasks assessed/performed       Past Medical History:  Diagnosis Date  . Hypothyroidism   . Osteoporosis   . Thyroid disease     Past Surgical History:  Procedure Laterality Date  . HIP FRACTURE SURGERY    . INTRAMEDULLARY (IM) NAIL INTERTROCHANTERIC Right 08/13/2017   Procedure: INTRAMEDULLARY (IM) NAIL INTERTROCHANTRIC;  Surgeon: Earnestine Leys, MD;  Location: [facility] ORS;  Service: Orthopedics;  Laterality: Right;  . SHOULDER SURGERY      There were no vitals filed for this visit.  Subjective Assessment - 09/16/17 1427    Subjective  doing well; tends to leave the walker and walk to seat.    Patient Stated Goals  improve mobility    Currently in Pain?  Yes    Pain Score  3     Pain Location  Hip    Pain Orientation  Right    Pain Descriptors / Indicators  Discomfort    Pain Type  Surgical pain    Pain Onset  1 to 4 weeks ago    Pain Frequency  Intermittent    Aggravating Factors   walking, standing, worse in AM when stiff; too much activity    Pain Relieving Factors  rest, medication (tylenol)                      [facility] Adult PT Treatment/Exercise - 09/16/17 1426      Ambulation/Gait   Ambulation/Gait Assistance  4: Min guard    Ambulation Distance (Feet)   150 Feet    Assistive device  Straight cane    Gait Pattern  Decreased stance time - right;Decreased step length - left;Decreased hip/knee flexion - right;Antalgic    Gait Comments  1 episode of Rt knee buckling; pt able to self correct      Knee/Hip Exercises: Standing   Heel Raises  Both;20 reps    Other Standing Knee Exercises  4 way SLR right and left x 10 each       Knee/Hip Exercises: Supine   Bridges  10 reps;2 sets    Straight Leg Raises  2 sets;10 reps;Both 2#    Other Supine Knee/Hip Exercises  single limb clamshell 2x10 with red theraband bil                  PT Long Term Goals - 09/07/17 1507      PT LONG TERM GOAL #1   Title  independent with HEP    Status  New    Target Date  10/05/17      PT LONG TERM GOAL #2   Title  amb > 250' with LRAD modified independent for  improved mobility    Status  New    Target Date  10/05/17      PT LONG TERM GOAL #3   Title  pt will report ability to walk > 20 min without increase in pain for improved function    Status  New    Target Date  10/05/17      PT LONG TERM GOAL #4   Title  report pain < 2/10 with activity for improved function    Status  New    Target Date  10/05/17      PT LONG TERM GOAL #5   Title  n/a            Plan - 09/16/17 1458    Clinical Impression Statement  Initiated gait training with [facility] today and pt demonstrated proper sequencing and technique with 1 episode on Rt knee buckling; but pt self corrected.  Progressing well with PT.      PT Treatment/Interventions  ADLs/Self Care Home Management;Cryotherapy;Electrical Stimulation;Moist Heat;Therapeutic exercise;Therapeutic activities;Functional mobility training;Stair training;Gait training;DME Instruction;Ultrasound;Balance training;Neuromuscular re-education;Patient/family education;Manual techniques;Taping;Passive range of motion;Scar mobilization    PT Next Visit Plan  may add weights to HEP as performed in clinic, try gait with cane;  continue LE strengthening; and balance    PT Home Exercise Plan  4 way standing SLR, bridge, SLR,      Consulted and Agree with Plan of Care  Patient;Family member/caregiver    Family Member Consulted  wife       Patient will benefit from skilled therapeutic intervention in order to improve the following deficits and impairments:  Abnormal gait, Decreased balance, Decreased range of motion, Decreased mobility, Difficulty walking, Postural dysfunction, Decreased strength, Pain  Visit Diagnosis: Pain in right hip  Stiffness of right hip, not elsewhere classified  Other abnormalities of gait and mobility     Problem List Patient Active Problem List   Diagnosis Date Noted  . Hip fracture ([place]) 08/12/2017  . Hallux valgus of left foot 04/28/2017  . Hammer toe of left foot 04/28/2017  . Corn of toe 04/28/2017  . History of total left hip replacement 09/19/2015  . Spherocytosis (familial) ([place]) 09/17/2015  . Mixed hyperlipidemia 06/19/2015  . Benign prostatic hyperplasia with lower urinary tract symptoms 06/19/2015  . History of fracture of vertebra 11/06/2014  . History of colon polyps 08/08/2014  . Pathological fracture 11/01/2013  . Toenail fungus 01/20/2013  . Vitamin D deficiency 09/27/2012  . Primary hypothyroidism 09/27/2012  . Osteoporosis 09/27/2012  . Spherocytosis ([place]) 12/04/2011  . Skin cancer 12/04/2011      Laureen Abrahams, PT, DPT 09/16/17 3:00 PM    [place], [place], [address] Phone: [phone]   Fax:  [phone]  Name: Eric Larsen MRN: [id number] Date of Birth: October 18, 1930

## 2017-09-21 ENCOUNTER — Ambulatory Visit: Payer: Medicare Other | Attending: Specialist | Admitting: Physical Therapy

## 2017-09-21 DIAGNOSIS — M25651 Stiffness of right hip, not elsewhere classified: Secondary | ICD-10-CM | POA: Insufficient documentation

## 2017-09-21 DIAGNOSIS — M25551 Pain in right hip: Secondary | ICD-10-CM | POA: Insufficient documentation

## 2017-09-21 DIAGNOSIS — R262 Difficulty in walking, not elsewhere classified: Secondary | ICD-10-CM | POA: Diagnosis present

## 2017-09-21 DIAGNOSIS — R293 Abnormal posture: Secondary | ICD-10-CM | POA: Diagnosis present

## 2017-09-21 DIAGNOSIS — R2689 Other abnormalities of gait and mobility: Secondary | ICD-10-CM | POA: Diagnosis present

## 2017-09-21 NOTE — Therapy (Signed)
[place], [place], [address] Phone: [phone]   Fax:  [phone]  Physical Therapy Treatment  Patient Details  Name: Eric Larsen MRN: [id number] Date of Birth: [birth year] Referring Provider: Dr Earnestine Leys (switching care to Dr. Mardelle Matte)   Encounter Date: 09/21/2017  PT End of Session - 09/21/17 1545    Visit Number  4    Number of Visits  8    Date for PT Re-Evaluation  10/05/17    Authorization Type  Medicare/BCBS    PT Start Time  1513    PT Stop Time  1545    PT Time Calculation (min)  32 min    Activity Tolerance  Patient tolerated treatment well    Behavior During Therapy  [facility] for tasks assessed/performed       Past Medical History:  Diagnosis Date  . Hypothyroidism   . Osteoporosis   . Thyroid disease     Past Surgical History:  Procedure Laterality Date  . HIP FRACTURE SURGERY    . INTRAMEDULLARY (IM) NAIL INTERTROCHANTERIC Right 08/13/2017   Procedure: INTRAMEDULLARY (IM) NAIL INTERTROCHANTRIC;  Surgeon: Earnestine Leys, MD;  Location: [facility] ORS;  Service: Orthopedics;  Laterality: Right;  . SHOULDER SURGERY      There were no vitals filed for this visit.  Subjective Assessment - 09/21/17 1515    Subjective  doing well; no new complaints    Patient Stated Goals  improve mobility    Currently in Pain?  Yes    Pain Score  3     Pain Location  Hip    Pain Orientation  Right    Pain Descriptors / Indicators  Discomfort    Pain Type  Surgical pain    Pain Onset  1 to 4 weeks ago    Pain Frequency  Intermittent    Aggravating Factors   walking, standing, worse at night after exercises    Pain Relieving Factors  rest, meds (tylenol)                      [facility] Adult PT Treatment/Exercise - 09/21/17 1518      Ambulation/Gait   Ambulation/Gait Assistance  4: Min guard    Ambulation Distance (Feet)  200 Feet    Assistive device  Straight cane    Gait Pattern   Decreased stance time - right;Decreased step length - left;Decreased hip/knee flexion - right;Antalgic      Knee/Hip Exercises: Aerobic   Nustep  L3 x 5 min      Knee/Hip Exercises: Standing   Lateral Step Up  Both;10 reps;Hand Hold: 1;Step Height: 4"    Forward Step Up  Both;10 reps;Hand Hold: 1;Step Height: 4"      Knee/Hip Exercises: Seated   Long Arc Quad  Both;15 reps;Weights    Long Arc Quad Weight  3 lbs.    Marching  Both;15 reps;Weights    Marching Weights  3 lbs.    Hamstring Curl  Both;15 reps    Hamstring Limitations  green theraband    Sit to Sand  2 sets;10 reps;without UE support min A for technique                  PT Long Term Goals - 09/07/17 1507      PT LONG TERM GOAL #1   Title  independent with HEP    Status  New    Target Date  10/05/17  PT LONG TERM GOAL #2   Title  amb > 250' with LRAD modified independent for improved mobility    Status  New    Target Date  10/05/17      PT LONG TERM GOAL #3   Title  pt will report ability to walk > 20 min without increase in pain for improved function    Status  New    Target Date  10/05/17      PT LONG TERM GOAL #4   Title  report pain < 2/10 with activity for improved function    Status  New    Target Date  10/05/17      PT LONG TERM GOAL #5   Title  n/a            Plan - 09/21/17 1545    Clinical Impression Statement  Pt tolerated session well today and demonstrated no LOB with amb with cane today.  Anticipate pt will be ready to transition to cane indoors next week.    PT Treatment/Interventions  ADLs/Self Care Home Management;Cryotherapy;Electrical Stimulation;Moist Heat;Therapeutic exercise;Therapeutic activities;Functional mobility training;Stair training;Gait training;DME Instruction;Ultrasound;Balance training;Neuromuscular re-education;Patient/family education;Manual techniques;Taping;Passive range of motion;Scar mobilization    PT Next Visit Plan  gait with cane, LE  strengthening and balance    Consulted and Agree with Plan of Care  Patient;Family member/caregiver    Family Member Consulted  wife       Patient will benefit from skilled therapeutic intervention in order to improve the following deficits and impairments:  Abnormal gait, Decreased balance, Decreased range of motion, Decreased mobility, Difficulty walking, Postural dysfunction, Decreased strength, Pain  Visit Diagnosis: Pain in right hip  Stiffness of right hip, not elsewhere classified  Other abnormalities of gait and mobility  Abnormal posture  Difficulty in walking, not elsewhere classified     Problem List Patient Active Problem List   Diagnosis Date Noted  . Hip fracture ([place]) 08/12/2017  . Hallux valgus of left foot 04/28/2017  . Hammer toe of left foot 04/28/2017  . Corn of toe 04/28/2017  . History of total left hip replacement 09/19/2015  . Spherocytosis (familial) ([place]) 09/17/2015  . Mixed hyperlipidemia 06/19/2015  . Benign prostatic hyperplasia with lower urinary tract symptoms 06/19/2015  . History of fracture of vertebra 11/06/2014  . History of colon polyps 08/08/2014  . Pathological fracture 11/01/2013  . Toenail fungus 01/20/2013  . Vitamin D deficiency 09/27/2012  . Primary hypothyroidism 09/27/2012  . Osteoporosis 09/27/2012  . Spherocytosis ([place]) 12/04/2011  . Skin cancer 12/04/2011      Laureen Abrahams, PT, DPT 09/21/17 3:47 PM    [facility] [address] [place], [place], [address] Phone: [phone]   Fax:  [phone]  Name: Usama Harkless MRN: [id number] Date of Birth: 1931-03-30

## 2017-09-23 ENCOUNTER — Encounter: Payer: Self-pay | Admitting: Physical Therapy

## 2017-09-23 ENCOUNTER — Ambulatory Visit: Payer: Medicare Other | Admitting: Physical Therapy

## 2017-09-23 DIAGNOSIS — R262 Difficulty in walking, not elsewhere classified: Secondary | ICD-10-CM

## 2017-09-23 DIAGNOSIS — R293 Abnormal posture: Secondary | ICD-10-CM

## 2017-09-23 DIAGNOSIS — M25551 Pain in right hip: Secondary | ICD-10-CM | POA: Diagnosis not present

## 2017-09-23 DIAGNOSIS — R2689 Other abnormalities of gait and mobility: Secondary | ICD-10-CM

## 2017-09-23 DIAGNOSIS — M25651 Stiffness of right hip, not elsewhere classified: Secondary | ICD-10-CM

## 2017-09-23 NOTE — Patient Instructions (Signed)
Step Down: Anterior   From 4-6" step, reach one leg forward as far as possible, still able to return easily. Touch toe and heel to floor. Return to single leg balance. Repeat with other leg. Repeat __15 __ times. Do _2-3___ sessions per day. At start, hold ____ pound dumbbells at: knee height. shoulder height. waist height. On return, bring weights: to waist. to shoulders. over head.  Copyright  VHI. All rights reserved.  Step-Up: Lateral   Step up to side with right leg. Bring other foot up onto _6___ inch step. Return to floor position with left leg. Repeat _15___ times per session. Do __2-3_ sessions per day. Repeat in dimly lit room. Repeat with eyes closed.  Copyright  VHI. All rights reserved.  Forward   Facing step, place one leg on step, flexed at hip. Step up slowly, bringing hips in line with knee and shoulder. Bring other foot onto step. Reverse process to step back down. Repeat with other leg. Do __15__ repetitions, __3__ sets. A day  http://bt.exer.us/154   Copyright  VHI. All rights reserved.   Sit-to-Stand Exercise The sit-to-stand exercise (also known as the chair stand or chair rise exercise) strengthens your lower body and helps you maintain or improve your mobility and independence. The goal is to do the sit-to-stand exercise without using your hands. This will be easier as you become stronger. You should always talk with your health care provider before starting any exercise program, especially if you have had recent surgery. Do the exercise exactly as told by your health care provider and adjust it as directed. It is normal to feel mild stretching, pulling, tightness, or discomfort as you do this exercise, but you should stop right away if you feel sudden pain or your pain gets worse. Do not begin doing this exercise until told by your health care provider. What the sit-to-stand exercise does The sit-to-stand exercise helps to strengthen the muscles in your  thighs and the muscles in the center of your body that give you stability (core muscles). This exercise is especially helpful if:  You have had knee or hip surgery.  You have trouble getting up from a chair, out of a car, or off the toilet.  How to do the sit-to-stand exercise 1. Sit toward the front edge of a sturdy chair without armrests. Your knees should be bent and your feet should be flat on the floor and shoulder-width apart. 2. Place your hands lightly on each side of the seat. Keep your back and neck as straight as possible, with your chest slightly forward. 3. Breathe in slowly. Lean forward and slightly shift your weight to the front of your feet. 4. Breathe out as you slowly stand up. Use your hands as little as possible. 5. Stand and pause for a full breath in and out. 6. Breathe in as you sit down slowly. Tighten your core and abdominal muscles to control your lowering as much as possible. 7. Breathe out slowly. 8. Do this exercise 10-15 times. If needed, do it fewer times until you build up strength. 9. Rest for 1 minute, then do another set of 10-15 repetitions. To change the difficulty of the sit-to-stand exercise  If the exercise is too difficult, use a chair with sturdy armrests, and push off the armrests to help you come to the standing position. You can also use the armrests to help slowly lower yourself back to sitting. As this gets easier, try to use your arms less. You can  also place a firm cushion or pillow on the chair to make the surface higher.  If this exercise is too easy, do not use your arms to help raise or lower yourself. You can also wear a weighted vest, use hand weights, increase your repetitions, or try a lower chair. General tips  You may feel tired when starting an exercise routine. This is normal.  You may have muscle soreness that lasts a few days. This is normal. As you get stronger, you may not feel muscle soreness.  Use smooth, steady  movements.  Do not  hold your breath during strength exercises. This can cause unsafe changes in your blood pressure.  Breathe in slowly through your nose, and breathe out slowly through your mouth. Summary  Strengthening your lower body is an important step to help you move safely and independently.  The sit-to-stand exercise helps strengthen the muscles in your thighs and core.  You should always talk with your health care provider before starting any exercise program, especially if you have had recent surgery. This information is not intended to replace advice given to you by your health care provider. Make sure you discuss any questions you have with your health care provider. Document Released: 09/24/2016 Document Revised: 09/24/2016 Document Reviewed: 09/24/2016 Elsevier Interactive Patient Education  2018 [employer].  Please do sit to stand without blocking knees against chair or mat for better control. Please do 10 15  sit to stands 3 times a day.    Voncille Lo, PT Certified Exercise Expert for the Aging Adult  09/23/17 2:05 PM Phone: [phone] Fax: [phone]

## 2017-09-23 NOTE — Therapy (Signed)
[place], [place], [address] Phone: [phone]   Fax:  [phone]  Physical Therapy Treatment  Patient Details  Name: Eric Larsen MRN: [id number] Date of Birth: 08/20/1930 Referring Provider: Dr Earnestine Leys (switching care to Dr. Mardelle Matte)   Encounter Date: 09/23/2017  PT End of Session - 09/23/17 1330    Visit Number  5    Number of Visits  8    Date for PT Re-Evaluation  10/05/17    Authorization Type  Medicare/BCBS    PT Start Time  1329    PT Stop Time  1425    PT Time Calculation (min)  56 min    Activity Tolerance  Patient tolerated treatment well    Behavior During Therapy  [facility] for tasks assessed/performed       Past Medical History:  Diagnosis Date  . Hypothyroidism   . Osteoporosis   . Thyroid disease     Past Surgical History:  Procedure Laterality Date  . HIP FRACTURE SURGERY    . INTRAMEDULLARY (IM) NAIL INTERTROCHANTERIC Right 08/13/2017   Procedure: INTRAMEDULLARY (IM) NAIL INTERTROCHANTRIC;  Surgeon: Earnestine Leys, MD;  Location: [facility] ORS;  Service: Orthopedics;  Laterality: Right;  . SHOULDER SURGERY      There were no vitals filed for this visit.  Subjective Assessment - 09/23/17 1330    Subjective  doing well, slight discomfort    Pertinent History  Lt THA    Limitations  Walking;Standing    How long can you stand comfortably?  10 minutes helped dry wishes    How long can you walk comfortably?  10 min    Patient Stated Goals  improve mobility    Currently in Pain?  Yes    Pain Score  2     Pain Orientation  Right    Pain Descriptors / Indicators  Discomfort    Pain Type  Surgical pain    Pain Onset  More than a month ago    Pain Frequency  Intermittent         [facility] PT Assessment - 09/23/17 1333      Functional Tests   Functional tests  Sit to Stand      Sit to Stand   Comments  6 in 30 seconds.  ( 10 normal for age)                   [facility] Adult PT Treatment/Exercise - 09/23/17 1333      Ambulation/Gait   Ambulation/Gait Assistance  3: Mod assist    Ambulation Distance (Feet)  400 Feet    Assistive device  Straight cane    Gait Pattern  Decreased stance time - right;Decreased step length - left;Decreased hip/knee flexion - right;Antalgic    Stairs  Yes    Stair Management Technique  With cane;Alternating pattern    Number of Stairs  20    Height of Stairs  6    Gait Comments  Pt walker checked for height.  Pt walker cannot be lowered. Pt at needed height for now to prevent over flexion of trunk.       Knee/Hip Exercises: Standing   Lateral Step Up  Both;Hand Hold: 1;Step Height: 4";15 reps    Forward Step Up  Both;Hand Hold: 1;Step Height: 4";15 reps    Step Down  Step Height: 6";Hand Hold: 1;1 set;15 reps    Other Standing Knee Exercises  use of UE flexion as tolerated on left  and full ROM while standing and incorporating kinetic chain and diagonals against  wall for lengthening of trunk fo 4 minutes      Knee/Hip Exercises: Seated   Long Arc Quad  Right;Strengthening;10 reps 4#    Marching  Both;15 reps;Weights    Sit to [employer]  2 sets;10 reps;without UE support min A for technique      Knee/Hip Exercises: Supine   Bridges  10 reps;2 sets 6 # over hips    Straight Leg Raises  2 sets;10 reps;Both 4#    Other Supine Knee/Hip Exercises  single limb clamshell 2x10 with green theraband bil             PT Education - 09/23/17 1410    Education provided  Yes    Person(s) Educated  Patient;Spouse    Methods  Explanation;Demonstration;Tactile cues;Verbal cues;Handout    Comprehension  Verbalized understanding;Returned demonstration          PT Long Term Goals - 09/23/17 1413      PT LONG TERM GOAL #1   Title  independent with HEP    Baseline  finalizing HEP in next 4 visits    Time  8    Period  Weeks    Status  On-going      PT LONG TERM GOAL #2   Title  amb > 250' with  LRAD modified independent for improved mobility    Baseline  using cane left UE for stairrail    Time  8    Period  Weeks    Status  On-going      PT LONG TERM GOAL #3   Title  pt will report ability to walk > 20 min without increase in pain for improved function    Baseline  able to stand for 10 minutes    Time  8    Period  Weeks    Status  On-going      PT LONG TERM GOAL #4   Title  report pain < 2/10 with activity for improved function    Baseline  Pain with exercises 2/10 today but no pain at rest    Time  8    Period  Weeks    Status  On-going      PT LONG TERM GOAL #5   Title  Pt will be able to perform sit to stand 10 x in 30 seconds with good form and no compensations    Baseline  only able to do 6 times in 30 sec    Time  8    Period  Weeks    Status  New            Plan - 09/23/17 1437    Clinical Impression Statement  Pt tolerated challenging session today. with steps, standing steps exercise and added weights for strengthening and full body sit to stands and step ups.  Weights for legs increased to 4 lbs. and green t band.  Pt utilizing walker full time in community and cane at home.  Pt tested 30 sec sit to stand and was 6  in 30 seconds.  Pt at high risk of fall if less than 10 sit to stands in 30 seconds.  Pt will continue strengthening in order to decrease relizance on walker    Rehab Potential  Good    PT Frequency  2x / week    PT Duration  4 weeks    PT Treatment/Interventions  ADLs/Self Care Home Management;Cryotherapy;Electrical Stimulation;Moist  Heat;Therapeutic exercise;Therapeutic activities;Functional mobility training;Stair training;Gait training;DME Instruction;Ultrasound;Balance training;Neuromuscular re-education;Patient/family education;Manual techniques;Taping;Passive range of motion;Scar mobilization    PT Next Visit Plan  gait with cane all times . check for safety,  progress exercise and check goals next visit    PT Home Exercise Plan  4 way  standing SLR, bridge, SLR,  step ups , forward, lateral and step downs, sit to stand    Consulted and Agree with Plan of Care  Patient;Family member/caregiver    Family Member Consulted  wife       Patient will benefit from skilled therapeutic intervention in order to improve the following deficits and impairments:  Abnormal gait, Decreased balance, Decreased range of motion, Decreased mobility, Difficulty walking, Postural dysfunction, Decreased strength, Pain  Visit Diagnosis: Pain in right hip  Stiffness of right hip, not elsewhere classified  Other abnormalities of gait and mobility  Abnormal posture  Difficulty in walking, not elsewhere classified     Problem List Patient Active Problem List   Diagnosis Date Noted  . Hip fracture ([place]) 08/12/2017  . Hallux valgus of left foot 04/28/2017  . Hammer toe of left foot 04/28/2017  . Corn of toe 04/28/2017  . History of total left hip replacement 09/19/2015  . Spherocytosis (familial) ([place]) 09/17/2015  . Mixed hyperlipidemia 06/19/2015  . Benign prostatic hyperplasia with lower urinary tract symptoms 06/19/2015  . History of fracture of vertebra 11/06/2014  . History of colon polyps 08/08/2014  . Pathological fracture 11/01/2013  . Toenail fungus 01/20/2013  . Vitamin D deficiency 09/27/2012  . Primary hypothyroidism 09/27/2012  . Osteoporosis 09/27/2012  . Spherocytosis ([place]) 12/04/2011  . Skin cancer 12/04/2011    Voncille Lo, PT Certified Exercise Expert for the Aging Adult  09/23/17 2:52 PM Phone: [phone] Fax: [facility] [address] [place], [place], [address] Phone: [phone]   Fax:  [phone]  Name: Eric Larsen MRN: [id number] Date of Birth: [birth year]

## 2017-09-28 ENCOUNTER — Ambulatory Visit: Payer: Medicare Other | Admitting: Physical Therapy

## 2017-09-28 ENCOUNTER — Encounter: Payer: Self-pay | Admitting: Physical Therapy

## 2017-09-28 DIAGNOSIS — M25551 Pain in right hip: Secondary | ICD-10-CM | POA: Diagnosis not present

## 2017-09-28 DIAGNOSIS — R2689 Other abnormalities of gait and mobility: Secondary | ICD-10-CM

## 2017-09-28 DIAGNOSIS — R293 Abnormal posture: Secondary | ICD-10-CM

## 2017-09-28 DIAGNOSIS — R262 Difficulty in walking, not elsewhere classified: Secondary | ICD-10-CM

## 2017-09-28 DIAGNOSIS — M25651 Stiffness of right hip, not elsewhere classified: Secondary | ICD-10-CM

## 2017-09-28 NOTE — Therapy (Signed)
[place], [place], [address] Phone: [phone]   Fax:  [phone]  Physical Therapy Treatment  Patient Details  Name: Eric Larsen MRN: [id number] Date of Birth: Apr 21, 1931 Referring Provider: Dr Earnestine Leys (switching care to Dr. Mardelle Matte)   Encounter Date: 09/28/2017  PT End of Session - 09/28/17 1450    Visit Number  6    Number of Visits  8    Date for PT Re-Evaluation  10/05/17    Authorization Type  Medicare/BCBS    PT Start Time  1359    PT Stop Time  1443    PT Time Calculation (min)  44 min    Activity Tolerance  Patient tolerated treatment well    Behavior During Therapy  [facility] for tasks assessed/performed       Past Medical History:  Diagnosis Date  . Hypothyroidism   . Osteoporosis   . Thyroid disease     Past Surgical History:  Procedure Laterality Date  . HIP FRACTURE SURGERY    . INTRAMEDULLARY (IM) NAIL INTERTROCHANTERIC Right 08/13/2017   Procedure: INTRAMEDULLARY (IM) NAIL INTERTROCHANTRIC;  Surgeon: Earnestine Leys, MD;  Location: [facility] ORS;  Service: Orthopedics;  Laterality: Right;  . SHOULDER SURGERY      There were no vitals filed for this visit.  Subjective Assessment - 09/28/17 1403    Subjective  a little sore after last session; thinks the added weight made him sore.  soreness has since resolved.    Patient Stated Goals  improve mobility    Currently in Pain?  No/denies                      [facility] Adult PT Treatment/Exercise - 09/28/17 1404      Ambulation/Gait   Ambulation/Gait Assistance  4: Min guard;5: Supervision    Ambulation Distance (Feet)  350 Feet    Assistive device  Straight cane    Gait Pattern  Decreased stance time - right;Decreased step length - left;Decreased hip/knee flexion - right;Antalgic    Gait Comments  improved posture and balance with SPC; stops to look at PT when in conversation and has difficulty with dynamic gait       Knee/Hip Exercises: Aerobic   Nustep  L5 x 8 min; LE only      Knee/Hip Exercises: Standing   Forward Step Up  Both;10 reps;Hand Hold: 1;Step Height: 6"    Other Standing Knee Exercises  horizontal abduction, scap retraction and shoulder extension with red theraband 2x10 on foam for balance and posture      Knee/Hip Exercises: Seated   Long Arc Quad  Both;20 reps;Weights    Long Arc Quad Weight  3 lbs.    Marching  Both;20 reps;Weights    Marching Weights  3 lbs.    Sit to Sand  2 sets;10 reps;without UE support with overhead reach (RUE) for posture                  PT Long Term Goals - 09/28/17 1451      PT LONG TERM GOAL #1   Title  independent with HEP    Baseline  finalizing HEP in next 4 visits    Status  On-going    Target Date  10/05/17      PT LONG TERM GOAL #2   Title  amb > 250' with LRAD modified independent for improved mobility    Baseline  using cane left UE for  stairrail    Status  On-going    Target Date  10/05/17      PT LONG TERM GOAL #3   Title  pt will report ability to walk > 20 min without increase in pain for improved function    Baseline  able to stand for 10 minutes    Status  On-going    Target Date  10/05/17      PT LONG TERM GOAL #4   Title  report pain < 2/10 with activity for improved function    Baseline  Pain with exercises 2/10 today but no pain at rest    Status  On-going    Target Date  10/05/17      PT LONG TERM GOAL #5   Title  Pt will be able to perform sit to stand 10 x in 30 seconds with good form and no compensations    Baseline  only able to do 6 times in 30 sec    Status  On-going    Target Date  10/05/17            Plan - 09/28/17 1452    Clinical Impression Statement  Pt continues to demonstrate imroved functional moiblity.  Nearing end of POC, so will begin addressing goals and determine continue v/s d/c.  Progressing well with PT.    PT Treatment/Interventions  ADLs/Self Care Home  Management;Cryotherapy;Electrical Stimulation;Moist Heat;Therapeutic exercise;Therapeutic activities;Functional mobility training;Stair training;Gait training;DME Instruction;Ultrasound;Balance training;Neuromuscular re-education;Patient/family education;Manual techniques;Taping;Passive range of motion;Scar mobilization    PT Next Visit Plan  gait with cane all times . check for safety,  start checking goals (?d/c v. renew); dynamic gait activities    Consulted and Agree with Plan of Care  Patient;Family member/caregiver    Family Member Consulted  wife       Patient will benefit from skilled therapeutic intervention in order to improve the following deficits and impairments:  Abnormal gait, Decreased balance, Decreased range of motion, Decreased mobility, Difficulty walking, Postural dysfunction, Decreased strength, Pain  Visit Diagnosis: Pain in right hip  Stiffness of right hip, not elsewhere classified  Other abnormalities of gait and mobility  Abnormal posture  Difficulty in walking, not elsewhere classified     Problem List Patient Active Problem List   Diagnosis Date Noted  . Hip fracture ([place]) 08/12/2017  . Hallux valgus of left foot 04/28/2017  . Hammer toe of left foot 04/28/2017  . Corn of toe 04/28/2017  . History of total left hip replacement 09/19/2015  . Spherocytosis (familial) ([place]) 09/17/2015  . Mixed hyperlipidemia 06/19/2015  . Benign prostatic hyperplasia with lower urinary tract symptoms 06/19/2015  . History of fracture of vertebra 11/06/2014  . History of colon polyps 08/08/2014  . Pathological fracture 11/01/2013  . Toenail fungus 01/20/2013  . Vitamin D deficiency 09/27/2012  . Primary hypothyroidism 09/27/2012  . Osteoporosis 09/27/2012  . Spherocytosis ([place]) 12/04/2011  . Skin cancer 12/04/2011      Laureen Abrahams, PT, DPT 09/28/17 2:58 PM     [facility] [address] [place], [place], [address] Phone: [phone]   Fax:  [phone]  Name: Eric Larsen MRN: [id number] Date of Birth: 08-21-1930

## 2017-09-30 ENCOUNTER — Ambulatory Visit: Payer: Medicare Other | Admitting: Physical Therapy

## 2017-09-30 DIAGNOSIS — R293 Abnormal posture: Secondary | ICD-10-CM

## 2017-09-30 DIAGNOSIS — R262 Difficulty in walking, not elsewhere classified: Secondary | ICD-10-CM

## 2017-09-30 DIAGNOSIS — R2689 Other abnormalities of gait and mobility: Secondary | ICD-10-CM

## 2017-09-30 DIAGNOSIS — M25551 Pain in right hip: Secondary | ICD-10-CM | POA: Diagnosis not present

## 2017-09-30 DIAGNOSIS — M25651 Stiffness of right hip, not elsewhere classified: Secondary | ICD-10-CM

## 2017-09-30 NOTE — Therapy (Signed)
[place], [place], [address] Phone: [phone]   Fax:  [phone]  Physical Therapy Treatment/Recertification  Patient Details  Name: Eric Larsen MRN: [id number] Date of Birth: 11/02/1930 Referring Provider: Dr Earnestine Leys (switching care to Dr. Mardelle Matte)   Encounter Date: 09/30/2017  PT End of Session - 09/30/17 1452    Visit Number  7    Number of Visits  15    Date for PT Re-Evaluation  10/28/17    Authorization Type  Medicare/BCBS    PT Start Time  1413    PT Stop Time  1452    PT Time Calculation (min)  39 min    Equipment Utilized During Treatment  Gait belt    Activity Tolerance  Patient tolerated treatment well;Patient limited by fatigue    Behavior During Therapy  [facility] for tasks assessed/performed       Past Medical History:  Diagnosis Date  . Hypothyroidism   . Osteoporosis   . Thyroid disease     Past Surgical History:  Procedure Laterality Date  . HIP FRACTURE SURGERY    . INTRAMEDULLARY (IM) NAIL INTERTROCHANTERIC Right 08/13/2017   Procedure: INTRAMEDULLARY (IM) NAIL INTERTROCHANTRIC;  Surgeon: Earnestine Leys, MD;  Location: [facility] ORS;  Service: Orthopedics;  Laterality: Right;  . SHOULDER SURGERY      There were no vitals filed for this visit.  Subjective Assessment - 09/30/17 1421    Subjective  doing well; some mild soreness with exercise which is short-term    How long can you walk comfortably?  ~ 10 min    Patient Stated Goals  improve mobility    Currently in Pain?  No/denies                      [facility] Adult PT Treatment/Exercise - 09/30/17 1422      Ambulation/Gait   Ambulation/Gait  Yes    Ambulation/Gait Assistance  5: Supervision    Ambulation/Gait Assistance Details  increased difficulty with head turns    Ambulation Distance (Feet)  370 Feet    Assistive device  Straight cane    Gait Pattern  Trunk flexed      Knee/Hip Exercises:  Aerobic   Nustep  L5 x 8 min; LE only      Knee/Hip Exercises: Standing   Heel Raises  Both;20 reps    Hip Flexion  Both;15 reps;Knee bent 3#    Hip Abduction  Both;15 reps;Knee straight 3#    Hip Extension  Both;15 reps;Knee straight 3#    Other Standing Knee Exercises  horizontal abduction, scap retraction and shoulder extension with red theraband 2x10 on foam for balance and posture      Knee/Hip Exercises: Seated   Sit to Sand  10 reps;without UE support with overhead reach (RUE) for posture                  PT Long Term Goals - 09/30/17 1453      PT LONG TERM GOAL #1   Title  independent with HEP    Status  On-going    Target Date  10/28/17      PT LONG TERM GOAL #2   Title  amb > 250' with LRAD modified independent for improved mobility    Baseline  09/30/17: supervision with dynamic mobility    Status  On-going    Target Date  10/28/17      PT LONG TERM GOAL #3  Title  pt will report ability to walk > 20 min without increase in pain for improved function    Baseline  able to stand for 10 minutes    Status  On-going    Target Date  10/28/17      PT LONG TERM GOAL #4   Title  report pain < 2/10 with activity for improved function    Baseline  09/30/17: up to 4/10 with exercise    Target Date  10/28/17      PT LONG TERM GOAL #5   Title  Pt will be able to perform sit to stand 10 x in 30 seconds with good form and no compensations    Status  On-going    Target Date  10/28/17            Plan - 09/30/17 1454    Clinical Impression Statement  Pt tolerated session well and is progressing well with amb on level surfaces with SPC.  Recommended he start increasing walking time at home.  Plan to renew 2x/wk x 4 wks.    PT Frequency  2x / week    PT Duration  4 weeks    PT Treatment/Interventions  ADLs/Self Care Home Management;Cryotherapy;Electrical Stimulation;Moist Heat;Therapeutic exercise;Therapeutic activities;Functional mobility training;Stair  training;Gait training;DME Instruction;Ultrasound;Balance training;Neuromuscular re-education;Patient/family education;Manual techniques;Taping;Passive range of motion;Scar mobilization    PT Next Visit Plan  gait with cane all times .  dynamic gait activities; posture and hip strengthening/balance activities    Consulted and Agree with Plan of Care  Patient;Family member/caregiver    Family Member Consulted  wife       Patient will benefit from skilled therapeutic intervention in order to improve the following deficits and impairments:  Abnormal gait, Decreased balance, Decreased range of motion, Decreased mobility, Difficulty walking, Postural dysfunction, Decreased strength, Pain  Visit Diagnosis: Pain in right hip - Plan: PT plan of care cert/re-cert  Stiffness of right hip, not elsewhere classified - Plan: PT plan of care cert/re-cert  Other abnormalities of gait and mobility - Plan: PT plan of care cert/re-cert  Abnormal posture - Plan: PT plan of care cert/re-cert  Difficulty in walking, not elsewhere classified - Plan: PT plan of care cert/re-cert     Problem List Patient Active Problem List   Diagnosis Date Noted  . Hip fracture ([place]) 08/12/2017  . Hallux valgus of left foot 04/28/2017  . Hammer toe of left foot 04/28/2017  . Corn of toe 04/28/2017  . History of total left hip replacement 09/19/2015  . Spherocytosis (familial) ([place]) 09/17/2015  . Mixed hyperlipidemia 06/19/2015  . Benign prostatic hyperplasia with lower urinary tract symptoms 06/19/2015  . History of fracture of vertebra 11/06/2014  . History of colon polyps 08/08/2014  . Pathological fracture 11/01/2013  . Toenail fungus 01/20/2013  . Vitamin D deficiency 09/27/2012  . Primary hypothyroidism 09/27/2012  . Osteoporosis 09/27/2012  . Spherocytosis ([place]) 12/04/2011  . Skin cancer 12/04/2011       Laureen Abrahams, PT, DPT 09/30/17 2:57 PM    [place]  [facility] [address] [place], [place], [address] Phone: [phone]   Fax:  [phone]  Name: Eric Larsen MRN: [id number] Date of Birth: [birth year]

## 2017-10-05 ENCOUNTER — Ambulatory Visit: Payer: Medicare Other | Admitting: Physical Therapy

## 2017-10-05 ENCOUNTER — Encounter: Payer: Self-pay | Admitting: Physical Therapy

## 2017-10-05 DIAGNOSIS — M25651 Stiffness of right hip, not elsewhere classified: Secondary | ICD-10-CM

## 2017-10-05 DIAGNOSIS — R293 Abnormal posture: Secondary | ICD-10-CM

## 2017-10-05 DIAGNOSIS — M25551 Pain in right hip: Secondary | ICD-10-CM | POA: Diagnosis not present

## 2017-10-05 DIAGNOSIS — R262 Difficulty in walking, not elsewhere classified: Secondary | ICD-10-CM

## 2017-10-05 DIAGNOSIS — R2689 Other abnormalities of gait and mobility: Secondary | ICD-10-CM

## 2017-10-05 NOTE — Therapy (Signed)
[place], [place], [address] Phone: [phone]   Fax:  [phone]  Physical Therapy Treatment  Patient Details  Name: Eric Larsen MRN: [id number] Date of Birth: Apr 13, 1931 Referring Provider: Dr Earnestine Leys (switching care to Dr. Mardelle Matte)   Encounter Date: 10/05/2017  PT End of Session - 10/05/17 1548    Visit Number  8    Number of Visits  15    Date for PT Re-Evaluation  10/28/17    Authorization Type  Medicare/BCBS    PT Start Time  9735 pt arrived late    PT Stop Time  1545    PT Time Calculation (min)  37 min    Equipment Utilized During Treatment  Gait belt    Activity Tolerance  Patient tolerated treatment well    Behavior During Therapy  [facility] for tasks assessed/performed       Past Medical History:  Diagnosis Date  . Hypothyroidism   . Osteoporosis   . Thyroid disease     Past Surgical History:  Procedure Laterality Date  . HIP FRACTURE SURGERY    . INTRAMEDULLARY (IM) NAIL INTERTROCHANTERIC Right 08/13/2017   Procedure: INTRAMEDULLARY (IM) NAIL INTERTROCHANTRIC;  Surgeon: Earnestine Leys, MD;  Location: [facility] ORS;  Service: Orthopedics;  Laterality: Right;  . SHOULDER SURGERY      There were no vitals filed for this visit.  Subjective Assessment - 10/05/17 1511    Subjective  doing well; no pain    Patient Stated Goals  improve mobility    Currently in Pain?  No/denies                      [facility] Adult PT Treatment/Exercise - 10/05/17 1511      Ambulation/Gait   Stairs  Yes    Stairs Assistance  4: Min guard    Stair Management Technique  With cane;Alternating pattern;One rail Right    Number of Stairs  12    Height of Stairs  6    Gait Comments  amb with head turns and over/around obstacles with supervision      Knee/Hip Exercises: Aerobic   Nustep  L6 x 8 min; LE only      Knee/Hip Exercises: Standing   Heel Raises  Both;20 reps    Gait Training   side stepping and backwards walking with 1UE support    Other Standing Knee Exercises  scap retraction and shoulder extension with red theraband 2x10 on foam for balance and posture      Knee/Hip Exercises: Seated   Sit to Sand  10 reps;without UE support on compliant surface with Rt overhead reach                  PT Long Term Goals - 09/30/17 1453      PT LONG TERM GOAL #1   Title  independent with HEP    Status  On-going    Target Date  10/28/17      PT LONG TERM GOAL #2   Title  amb > 250' with LRAD modified independent for improved mobility    Baseline  09/30/17: supervision with dynamic mobility    Status  On-going    Target Date  10/28/17      PT LONG TERM GOAL #3   Title  pt will report ability to walk > 20 min without increase in pain for improved function    Baseline  able to stand for 10  minutes    Status  On-going    Target Date  10/28/17      PT LONG TERM GOAL #4   Title  report pain < 2/10 with activity for improved function    Baseline  09/30/17: up to 4/10 with exercise    Target Date  10/28/17      PT LONG TERM GOAL #5   Title  Pt will be able to perform sit to stand 10 x in 30 seconds with good form and no compensations    Status  On-going    Target Date  10/28/17            Plan - 10/05/17 1549    Clinical Impression Statement  Pt needs minguard A to negotiate obstacles with SPC, but progressing well with PT.    PT Treatment/Interventions  ADLs/Self Care Home Management;Cryotherapy;Electrical Stimulation;Moist Heat;Therapeutic exercise;Therapeutic activities;Functional mobility training;Stair training;Gait training;DME Instruction;Ultrasound;Balance training;Neuromuscular re-education;Patient/family education;Manual techniques;Taping;Passive range of motion;Scar mobilization    PT Next Visit Plan  gait with cane all times .  dynamic gait activities; posture and hip strengthening/balance activities    Consulted and Agree with Plan of Care   Patient       Patient will benefit from skilled therapeutic intervention in order to improve the following deficits and impairments:  Abnormal gait, Decreased balance, Decreased range of motion, Decreased mobility, Difficulty walking, Postural dysfunction, Decreased strength, Pain  Visit Diagnosis: Pain in right hip  Stiffness of right hip, not elsewhere classified  Other abnormalities of gait and mobility  Abnormal posture  Difficulty in walking, not elsewhere classified     Problem List Patient Active Problem List   Diagnosis Date Noted  . Hip fracture ([place]) 08/12/2017  . Hallux valgus of left foot 04/28/2017  . Hammer toe of left foot 04/28/2017  . Corn of toe 04/28/2017  . History of total left hip replacement 09/19/2015  . Spherocytosis (familial) ([place]) 09/17/2015  . Mixed hyperlipidemia 06/19/2015  . Benign prostatic hyperplasia with lower urinary tract symptoms 06/19/2015  . History of fracture of vertebra 11/06/2014  . History of colon polyps 08/08/2014  . Pathological fracture 11/01/2013  . Toenail fungus 01/20/2013  . Vitamin D deficiency 09/27/2012  . Primary hypothyroidism 09/27/2012  . Osteoporosis 09/27/2012  . Spherocytosis ([place]) 12/04/2011  . Skin cancer 12/04/2011      Laureen Abrahams, PT, DPT 10/05/17 3:52 PM    [place] [facility] [address] [place], [place], [address] Phone: [phone]   Fax:  [phone]  Name: Eric Larsen MRN: [id number] Date of Birth: 03/20/1931

## 2017-10-07 ENCOUNTER — Encounter: Payer: Self-pay | Admitting: Physical Therapy

## 2017-10-07 ENCOUNTER — Ambulatory Visit: Payer: Medicare Other | Admitting: Physical Therapy

## 2017-10-07 DIAGNOSIS — M25651 Stiffness of right hip, not elsewhere classified: Secondary | ICD-10-CM

## 2017-10-07 DIAGNOSIS — R293 Abnormal posture: Secondary | ICD-10-CM

## 2017-10-07 DIAGNOSIS — R2689 Other abnormalities of gait and mobility: Secondary | ICD-10-CM

## 2017-10-07 DIAGNOSIS — M25551 Pain in right hip: Secondary | ICD-10-CM

## 2017-10-07 DIAGNOSIS — R262 Difficulty in walking, not elsewhere classified: Secondary | ICD-10-CM

## 2017-10-07 NOTE — Therapy (Signed)
[place], [place], [address] Phone: [phone]   Fax:  [phone]  Physical Therapy Treatment  Patient Details  Name: Eric Larsen MRN: [id number] Date of Birth: [birth year] Referring Provider: Dr Earnestine Leys (switching care to Dr. Mardelle Matte)   Encounter Date: 10/07/2017  PT End of Session - 10/07/17 1504    Visit Number  9    Number of Visits  15    Date for PT Re-Evaluation  10/28/17    Authorization Type  Medicare/BCBS    PT Start Time  1413    PT Stop Time  1453    PT Time Calculation (min)  40 min    Equipment Utilized During Treatment  Gait belt    Activity Tolerance  Patient tolerated treatment well    Behavior During Therapy  [facility] for tasks assessed/performed       Past Medical History:  Diagnosis Date  . Hypothyroidism   . Osteoporosis   . Thyroid disease     Past Surgical History:  Procedure Laterality Date  . HIP FRACTURE SURGERY    . INTRAMEDULLARY (IM) NAIL INTERTROCHANTERIC Right 08/13/2017   Procedure: INTRAMEDULLARY (IM) NAIL INTERTROCHANTRIC;  Surgeon: Earnestine Leys, MD;  Location: [facility] ORS;  Service: Orthopedics;  Laterality: Right;  . SHOULDER SURGERY      There were no vitals filed for this visit.  Subjective Assessment - 10/07/17 1415    Subjective  using cane outside of the house.  reports he's taking a "few steps" in the house without cane.  no falls to report    Patient Stated Goals  improve mobility    Currently in Pain?  No/denies                      [facility] Adult PT Treatment/Exercise - 10/07/17 1417      Ambulation/Gait   Gait Comments  amb 300' with supervision and horiozontal/vertical head turns      Knee/Hip Exercises: Aerobic   Nustep  L6 x 8 min; LE only      Knee/Hip Exercises: Standing   Gait Training  sidestepping, backwards walking, and tandem walking with 1-2 UE support 10'x4      Knee/Hip Exercises: Seated   Long Arc Quad   Both;15 reps;Weights    Long Arc Quad Weight  4 lbs.    Ball Squeeze  15x5 sec    Marching  Both;15 reps;Weights    Marching Weights  4 lbs.    Hamstring Curl  Both;15 reps    Hamstring Limitations  blue theraband    Sit to Sand  10 reps;without UE support on compliant surface with Rt overhead reach                  PT Long Term Goals - 09/30/17 1453      PT LONG TERM GOAL #1   Title  independent with HEP    Status  On-going    Target Date  10/28/17      PT LONG TERM GOAL #2   Title  amb > 250' with LRAD modified independent for improved mobility    Baseline  09/30/17: supervision with dynamic mobility    Status  On-going    Target Date  10/28/17      PT LONG TERM GOAL #3   Title  pt will report ability to walk > 20 min without increase in pain for improved function    Baseline  able to stand  for 10 minutes    Status  On-going    Target Date  10/28/17      PT LONG TERM GOAL #4   Title  report pain < 2/10 with activity for improved function    Baseline  09/30/17: up to 4/10 with exercise    Target Date  10/28/17      PT LONG TERM GOAL #5   Title  Pt will be able to perform sit to stand 10 x in 30 seconds with good form and no compensations    Status  On-going    Target Date  10/28/17            Plan - 10/07/17 1504    Clinical Impression Statement  Pt tolerated session well today, but needs occasional cues for posture with ambulation.  Needs continued balance and strengthening exercises.    PT Treatment/Interventions  ADLs/Self Care Home Management;Cryotherapy;Electrical Stimulation;Moist Heat;Therapeutic exercise;Therapeutic activities;Functional mobility training;Stair training;Gait training;DME Instruction;Ultrasound;Balance training;Neuromuscular re-education;Patient/family education;Manual techniques;Taping;Passive range of motion;Scar mobilization    PT Next Visit Plan  gait with cane all times .  dynamic gait activities; posture and hip  strengthening/balance activities    Consulted and Agree with Plan of Care  Patient       Patient will benefit from skilled therapeutic intervention in order to improve the following deficits and impairments:  Abnormal gait, Decreased balance, Decreased range of motion, Decreased mobility, Difficulty walking, Postural dysfunction, Decreased strength, Pain  Visit Diagnosis: Pain in right hip  Stiffness of right hip, not elsewhere classified  Other abnormalities of gait and mobility  Abnormal posture  Difficulty in walking, not elsewhere classified     Problem List Patient Active Problem List   Diagnosis Date Noted  . Hip fracture ([place]) 08/12/2017  . Hallux valgus of left foot 04/28/2017  . Hammer toe of left foot 04/28/2017  . Corn of toe 04/28/2017  . History of total left hip replacement 09/19/2015  . Spherocytosis (familial) ([place]) 09/17/2015  . Mixed hyperlipidemia 06/19/2015  . Benign prostatic hyperplasia with lower urinary tract symptoms 06/19/2015  . History of fracture of vertebra 11/06/2014  . History of colon polyps 08/08/2014  . Pathological fracture 11/01/2013  . Toenail fungus 01/20/2013  . Vitamin D deficiency 09/27/2012  . Primary hypothyroidism 09/27/2012  . Osteoporosis 09/27/2012  . Spherocytosis ([place]) 12/04/2011  . Skin cancer 12/04/2011      Laureen Abrahams, PT, DPT 10/07/17 3:10 PM    [facility] [address] [place], [place], [address] Phone: [phone]   Fax:  [phone]  Name: Jacori Mulrooney MRN: [id number] Date of Birth: 1931-05-04

## 2017-10-12 ENCOUNTER — Ambulatory Visit: Payer: Medicare Other | Admitting: Physical Therapy

## 2017-10-12 ENCOUNTER — Encounter: Payer: Self-pay | Admitting: Physical Therapy

## 2017-10-12 DIAGNOSIS — R293 Abnormal posture: Secondary | ICD-10-CM

## 2017-10-12 DIAGNOSIS — R262 Difficulty in walking, not elsewhere classified: Secondary | ICD-10-CM

## 2017-10-12 DIAGNOSIS — M25651 Stiffness of right hip, not elsewhere classified: Secondary | ICD-10-CM

## 2017-10-12 DIAGNOSIS — M25551 Pain in right hip: Secondary | ICD-10-CM | POA: Diagnosis not present

## 2017-10-12 DIAGNOSIS — R2689 Other abnormalities of gait and mobility: Secondary | ICD-10-CM

## 2017-10-12 NOTE — Therapy (Signed)
[place], [place], [address] Phone: [phone]   Fax:  [phone]  Physical Therapy Treatment  Patient Details  Name: Eric Larsen MRN: [id number] Date of Birth: [birth year] Referring Provider: Dr Earnestine Leys (switching care to Dr. Mardelle Matte)   Encounter Date: 10/12/2017  PT End of Session - 10/12/17 1542    Visit Number  10    Number of Visits  15    Date for PT Re-Evaluation  10/28/17    PT Start Time  1508 pt arrived late    PT Stop Time  1542    PT Time Calculation (min)  34 min    Equipment Utilized During Treatment  Gait belt    Activity Tolerance  Patient tolerated treatment well    Behavior During Therapy  [facility] for tasks assessed/performed       Past Medical History:  Diagnosis Date  . Hypothyroidism   . Osteoporosis   . Thyroid disease     Past Surgical History:  Procedure Laterality Date  . HIP FRACTURE SURGERY    . INTRAMEDULLARY (IM) NAIL INTERTROCHANTERIC Right 08/13/2017   Procedure: INTRAMEDULLARY (IM) NAIL INTERTROCHANTRIC;  Surgeon: Earnestine Leys, MD;  Location: [facility] ORS;  Service: Orthopedics;  Laterality: Right;  . SHOULDER SURGERY      There were no vitals filed for this visit.  Subjective Assessment - 10/12/17 1511    Subjective  doing well; no falls or changes    Patient Stated Goals  improve mobility    Currently in Pain?  No/denies                      [facility] Adult PT Treatment/Exercise - 10/12/17 1512      Ambulation/Gait   Ambulation/Gait  Yes    Ambulation/Gait Assistance  5: Supervision    Ambulation Distance (Feet)  250 Feet    Assistive device  Straight cane    Gait Pattern  Trunk flexed    Ambulation Surface  Level;Indoor    Stairs  Yes    Stairs Assistance  5: Supervision    Stair Management Technique  With cane;Alternating pattern;One rail Right    Number of Stairs  12    Height of Stairs  6    Door Management  5: Supervision     Gait Comments  amb with head turns      Knee/Hip Exercises: Aerobic   Nustep  L5 x 6 min; LE only      Knee/Hip Exercises: Standing   Heel Raises  Both;20 reps toe raises x 20    Knee Flexion  Both;15 reps 3#    Hip Flexion  Both;15 reps;Knee bent 3#    Hip Abduction  Both;15 reps;Knee straight 3#    Hip Extension  Both;15 reps;Knee straight 3#    Functional Squat  2 sets;10 reps minisquat    Gait Training  sidestepping without UE support; tandem gait with 1UE support 10'x4      Knee/Hip Exercises: Seated   Sit to Sand  2 sets;10 reps;without UE support                  PT Long Term Goals - 09/30/17 1453      PT LONG TERM GOAL #1   Title  independent with HEP    Status  On-going    Target Date  10/28/17      PT LONG TERM GOAL #2   Title  amb > 250' with  LRAD modified independent for improved mobility    Baseline  09/30/17: supervision with dynamic mobility    Status  On-going    Target Date  10/28/17      PT LONG TERM GOAL #3   Title  pt will report ability to walk > 20 min without increase in pain for improved function    Baseline  able to stand for 10 minutes    Status  On-going    Target Date  10/28/17      PT LONG TERM GOAL #4   Title  report pain < 2/10 with activity for improved function    Baseline  09/30/17: up to 4/10 with exercise    Target Date  10/28/17      PT LONG TERM GOAL #5   Title  Pt will be able to perform sit to stand 10 x in 30 seconds with good form and no compensations    Status  On-going    Target Date  10/28/17            Plan - 10/12/17 1542    Clinical Impression Statement  Pt continues to demonstrate progress with PT and is safely ambulating with SPC at this time.  Progressing well with PT.    PT Treatment/Interventions  ADLs/Self Care Home Management;Cryotherapy;Electrical Stimulation;Moist Heat;Therapeutic exercise;Therapeutic activities;Functional mobility training;Stair training;Gait training;DME  Instruction;Ultrasound;Balance training;Neuromuscular re-education;Patient/family education;Manual techniques;Taping;Passive range of motion;Scar mobilization    PT Next Visit Plan  gait with cane all times .  dynamic gait activities; posture and hip strengthening/balance activities       Patient will benefit from skilled therapeutic intervention in order to improve the following deficits and impairments:  Abnormal gait, Decreased balance, Decreased range of motion, Decreased mobility, Difficulty walking, Postural dysfunction, Decreased strength, Pain  Visit Diagnosis: Pain in right hip  Stiffness of right hip, not elsewhere classified  Other abnormalities of gait and mobility  Abnormal posture  Difficulty in walking, not elsewhere classified     Problem List Patient Active Problem List   Diagnosis Date Noted  . Hip fracture ([place]) 08/12/2017  . Hallux valgus of left foot 04/28/2017  . Hammer toe of left foot 04/28/2017  . Corn of toe 04/28/2017  . History of total left hip replacement 09/19/2015  . Spherocytosis (familial) ([place]) 09/17/2015  . Mixed hyperlipidemia 06/19/2015  . Benign prostatic hyperplasia with lower urinary tract symptoms 06/19/2015  . History of fracture of vertebra 11/06/2014  . History of colon polyps 08/08/2014  . Pathological fracture 11/01/2013  . Toenail fungus 01/20/2013  . Vitamin D deficiency 09/27/2012  . Primary hypothyroidism 09/27/2012  . Osteoporosis 09/27/2012  . Spherocytosis ([place]) 12/04/2011  . Skin cancer 12/04/2011      Laureen Abrahams, PT, DPT 10/12/17 3:43 PM    [facility] [address] [place], [place], [address] Phone: [phone]   Fax:  [phone]  Name: Lander Eslick MRN: [id number] Date of Birth: 03/27/1931

## 2017-10-14 ENCOUNTER — Ambulatory Visit: Payer: Medicare Other | Admitting: Physical Therapy

## 2017-10-14 ENCOUNTER — Encounter: Payer: Self-pay | Admitting: Physical Therapy

## 2017-10-14 DIAGNOSIS — M25651 Stiffness of right hip, not elsewhere classified: Secondary | ICD-10-CM

## 2017-10-14 DIAGNOSIS — R262 Difficulty in walking, not elsewhere classified: Secondary | ICD-10-CM

## 2017-10-14 DIAGNOSIS — R2689 Other abnormalities of gait and mobility: Secondary | ICD-10-CM

## 2017-10-14 DIAGNOSIS — R293 Abnormal posture: Secondary | ICD-10-CM

## 2017-10-14 DIAGNOSIS — M25551 Pain in right hip: Secondary | ICD-10-CM

## 2017-10-14 NOTE — Therapy (Signed)
[place], [place], [address] Phone: [phone]   Fax:  [phone]  Physical Therapy Treatment  Patient Details  Name: Eric Larsen MRN: [id number] Date of Birth: [birth year] Referring Provider: Dr Earnestine Leys (switching care to Dr. Mardelle Matte)   Encounter Date: 10/14/2017  PT End of Session - 10/14/17 1535    Visit Number  11    Number of Visits  15    Date for PT Re-Evaluation  10/28/17    Authorization Type  Medicare/BCBS    PT Start Time  1458    PT Stop Time  1538    PT Time Calculation (min)  40 min    Equipment Utilized During Treatment  Gait belt    Activity Tolerance  Patient tolerated treatment well    Behavior During Therapy  [facility] for tasks assessed/performed       Past Medical History:  Diagnosis Date  . Hypothyroidism   . Osteoporosis   . Thyroid disease     Past Surgical History:  Procedure Laterality Date  . HIP FRACTURE SURGERY    . INTRAMEDULLARY (IM) NAIL INTERTROCHANTERIC Right 08/13/2017   Procedure: INTRAMEDULLARY (IM) NAIL INTERTROCHANTRIC;  Surgeon: Earnestine Leys, MD;  Location: [facility] ORS;  Service: Orthopedics;  Laterality: Right;  . SHOULDER SURGERY      There were no vitals filed for this visit.  Subjective Assessment - 10/14/17 1502    Subjective  hip is doing well; no complaints    Patient Stated Goals  improve mobility    Currently in Pain?  No/denies                      [facility] Adult PT Treatment/Exercise - 10/14/17 1502      Ambulation/Gait   Ambulation/Gait  Yes    Ambulation/Gait Assistance  4: Min guard    Ambulation Distance (Feet)  250 Feet    Assistive device  Straight cane    Ambulation Surface  Unlevel;Outdoor;Grass    Curb  5: Supervision      Knee/Hip Exercises: Aerobic   Nustep  L6 x 8 min; LE only      Knee/Hip Exercises: Standing   Heel Raises  Both;20 reps toe raises x 20    Knee Flexion  Both;15 reps 3#    Hip  Flexion  Both;15 reps;Knee bent 3#    Hip Abduction  Both;15 reps;Knee straight 3#    Hip Extension  Both;15 reps;Knee straight 3#          Balance Exercises - 10/14/17 1527      Balance Exercises: Standing   Standing Eyes Opened  Narrow base of support (BOS);Head turns;Foam/compliant surface min A    Rockerboard  Anterior/posterior;Lateral;Intermittent UE support for midline awareness             PT Long Term Goals - 09/30/17 1453      PT LONG TERM GOAL #1   Title  independent with HEP    Status  On-going    Target Date  10/28/17      PT LONG TERM GOAL #2   Title  amb > 250' with LRAD modified independent for improved mobility    Baseline  09/30/17: supervision with dynamic mobility    Status  On-going    Target Date  10/28/17      PT LONG TERM GOAL #3   Title  pt will report ability to walk > 20 min without increase in pain for improved  function    Baseline  able to stand for 10 minutes    Status  On-going    Target Date  10/28/17      PT LONG TERM GOAL #4   Title  report pain < 2/10 with activity for improved function    Baseline  09/30/17: up to 4/10 with exercise    Target Date  10/28/17      PT LONG TERM GOAL #5   Title  Pt will be able to perform sit to stand 10 x in 30 seconds with good form and no compensations    Status  On-going    Target Date  10/28/17            Plan - 10/14/17 1535    Clinical Impression Statement  Pt needed minguard A to amb outdoors on grass today but no LOB noted.  Progressing well and anticipate pt may be ready for d/c next 1-2 weeks.    PT Treatment/Interventions  ADLs/Self Care Home Management;Cryotherapy;Electrical Stimulation;Moist Heat;Therapeutic exercise;Therapeutic activities;Functional mobility training;Stair training;Gait training;DME Instruction;Ultrasound;Balance training;Neuromuscular re-education;Patient/family education;Manual techniques;Taping;Passive range of motion;Scar mobilization    PT Next Visit Plan   gait with cane all times .  dynamic gait activities; posture and hip strengthening/balance activities; consider d/c planning    Consulted and Agree with Plan of Care  Patient       Patient will benefit from skilled therapeutic intervention in order to improve the following deficits and impairments:  Abnormal gait, Decreased balance, Decreased range of motion, Decreased mobility, Difficulty walking, Postural dysfunction, Decreased strength, Pain  Visit Diagnosis: Pain in right hip  Stiffness of right hip, not elsewhere classified  Other abnormalities of gait and mobility  Abnormal posture  Difficulty in walking, not elsewhere classified     Problem List Patient Active Problem List   Diagnosis Date Noted  . Hip fracture ([place]) 08/12/2017  . Hallux valgus of left foot 04/28/2017  . Hammer toe of left foot 04/28/2017  . Corn of toe 04/28/2017  . History of total left hip replacement 09/19/2015  . Spherocytosis (familial) ([place]) 09/17/2015  . Mixed hyperlipidemia 06/19/2015  . Benign prostatic hyperplasia with lower urinary tract symptoms 06/19/2015  . History of fracture of vertebra 11/06/2014  . History of colon polyps 08/08/2014  . Pathological fracture 11/01/2013  . Toenail fungus 01/20/2013  . Vitamin D deficiency 09/27/2012  . Primary hypothyroidism 09/27/2012  . Osteoporosis 09/27/2012  . Spherocytosis ([place]) 12/04/2011  . Skin cancer 12/04/2011      Laureen Abrahams, PT, DPT 10/14/17 3:38 PM     [facility] [address] [place], [place], [address] Phone: [phone]   Fax:  [phone]  Name: Eric Larsen MRN: [id number] Date of Birth: 21-Oct-1930

## 2017-10-19 ENCOUNTER — Encounter: Payer: Self-pay | Admitting: Physical Therapy

## 2017-10-19 ENCOUNTER — Ambulatory Visit: Payer: Medicare Other | Attending: Specialist | Admitting: Physical Therapy

## 2017-10-19 DIAGNOSIS — M25551 Pain in right hip: Secondary | ICD-10-CM | POA: Diagnosis present

## 2017-10-19 DIAGNOSIS — R293 Abnormal posture: Secondary | ICD-10-CM | POA: Insufficient documentation

## 2017-10-19 DIAGNOSIS — R2689 Other abnormalities of gait and mobility: Secondary | ICD-10-CM | POA: Insufficient documentation

## 2017-10-19 DIAGNOSIS — M25651 Stiffness of right hip, not elsewhere classified: Secondary | ICD-10-CM | POA: Insufficient documentation

## 2017-10-19 DIAGNOSIS — R262 Difficulty in walking, not elsewhere classified: Secondary | ICD-10-CM | POA: Diagnosis present

## 2017-10-19 NOTE — Therapy (Signed)
[place], [place], [address] Phone: [phone]   Fax:  [phone]  Physical Therapy Treatment  Patient Details  Name: Eric Larsen MRN: [id number] Date of Birth: July 10, 1931 Referring Provider: Dr Earnestine Leys (switching care to Dr. Mardelle Matte)   Encounter Date: 10/19/2017  PT End of Session - 10/19/17 0936    Visit Number  12    Number of Visits  15    Date for PT Re-Evaluation  10/28/17    Authorization Type  Medicare/BCBS    PT Start Time  0934    PT Stop Time  1016    PT Time Calculation (min)  42 min    Activity Tolerance  Patient tolerated treatment well    Behavior During Therapy  [facility] for tasks assessed/performed       Past Medical History:  Diagnosis Date  . Hypothyroidism   . Osteoporosis   . Thyroid disease     Past Surgical History:  Procedure Laterality Date  . HIP FRACTURE SURGERY    . INTRAMEDULLARY (IM) NAIL INTERTROCHANTERIC Right 08/13/2017   Procedure: INTRAMEDULLARY (IM) NAIL INTERTROCHANTRIC;  Surgeon: Earnestine Leys, MD;  Location: [facility] ORS;  Service: Orthopedics;  Laterality: Right;  . SHOULDER SURGERY      There were no vitals filed for this visit.  Subjective Assessment - 10/19/17 0938    Subjective  doing well with hip,  No complaints    Pertinent History  Lt THA    Limitations  Walking;Standing    How long can you stand comfortably?  15 to 20 minutes     Patient Stated Goals  improve mobility    Currently in Pain?  No/denies                      [facility] Adult PT Treatment/Exercise - 10/19/17 0943      Ambulation/Gait   Ambulation/Gait  Yes    Ambulation/Gait Assistance  4: Min guard    Ambulation Distance (Feet)  250 Feet    Assistive device  Straight cane    Ambulation Surface  Unlevel;Indoor foam mat incline      Neuro Re-ed    Neuro Re-ed Details   rocker board hip /ankle strategies forward and lateral for 5 mnutes.       Knee/Hip  Exercises: Standing   Heel Raises  Both;20 reps toe raises x 20,     Knee Flexion  Both;15 reps 3# with 3 x 10 sec end range hover    Hip Flexion  Both;15 reps;Knee bent 3# holding to counter end range hover x 3     Hip Abduction  Both;15 reps;Knee straight 3# end range x 3 hovers for 10 sec each    Hip Extension  Both;15 reps;Knee straight 3# 3 hovers for 10 sec at end range    Lateral Step Up Limitations  step up and down laterally and behind x 10 for right and left    SLS  R and L single limb heel raise 20 x each    Other Standing Knee Exercises  goblet squat with 5 lb with chair behind, 5 x then 10 x,  attempted 10lb with CGA  with 5 reps    Other Standing Knee Exercises  hip flexion 5 x at end range flexion with  self pertubation x 5 each side              PT Education - 10/19/17 1159    Education provided  Yes    Education Details  added goblet squat to HEP     Person(s) Educated  Patient    Methods  Explanation;Demonstration;Verbal cues    Comprehension  Verbalized understanding;Returned demonstration          PT Long Term Goals - 10/19/17 1144      PT LONG TERM GOAL #1   Title  independent with HEP    Baseline  finalizing HEP for DC on 10/28/17    Time  8    Period  Weeks    Status  On-going      PT LONG TERM GOAL #2   Title  amb > 250' with LRAD modified independent for improved mobility    Baseline  10/19/17 supervision ambulation on mats and min Guard on foam incline    Time  8    Period  Weeks    Status  On-going      PT LONG TERM GOAL #3   Title  pt will report ability to walk > 20 min without increase in pain for improved function    Baseline  able to stand for 15 to 20 minutes    Time  8    Period  Weeks    Status  On-going      PT LONG TERM GOAL #4   Title  report pain < 2/10 with activity for improved function    Baseline  10/19/17 no pain noted today     Time  8    Period  Weeks    Status  Achieved      PT LONG TERM GOAL #5   Title  Pt will  be able to perform sit to stand 10 x in 30 seconds with good form and no compensations    Baseline  Pt began sit to stand with goblet squat with 5 lb weights    Time  8    Period  Weeks    Status  On-going            Plan - 10/19/17 1155    Clinical Impression Statement  Pt need supervision to ambulate indoors on foam mat but no LOB noted.  Pt is performing  functional activities without pain as reported by pt.  Pt able to perform goblet squat with 5 lbs today. x 10 slow and controlled.  Pt is solidifying HEP and will be ready for DC by 10/28/17.  Pt will be challenged in remaining visits with neuro reeducation activities and fall prevention.     Rehab Potential  Good    PT Frequency  2x / week    PT Duration  4 weeks    PT Treatment/Interventions  ADLs/Self Care Home Management;Cryotherapy;Electrical Stimulation;Moist Heat;Therapeutic exercise;Therapeutic activities;Functional mobility training;Stair training;Gait training;DME Instruction;Ultrasound;Balance training;Neuromuscular re-education;Patient/family education;Manual techniques;Taping;Passive range of motion;Scar mobilization    PT Next Visit Plan  gait with cane all times .  dynamic gait activities; posture and hip strengthening/balance activities; consider d/c planning, outside environments    PT Home Exercise Plan  4 way standing SLR, bridge, SLR,  step ups , forward, lateral and step downs, sit to stand goblet squats.    Consulted and Agree with Plan of Care  Patient       Patient will benefit from skilled therapeutic intervention in order to improve the following deficits and impairments:  Abnormal gait, Decreased balance, Decreased range of motion, Decreased mobility, Difficulty walking, Postural dysfunction, Decreased strength, Pain  Visit Diagnosis: Pain in right hip  Stiffness of  right hip, not elsewhere classified  Other abnormalities of gait and mobility  Abnormal posture  Difficulty in walking, not elsewhere  classified     Problem List Patient Active Problem List   Diagnosis Date Noted  . Hip fracture ([place]) 08/12/2017  . Hallux valgus of left foot 04/28/2017  . Hammer toe of left foot 04/28/2017  . Corn of toe 04/28/2017  . History of total left hip replacement 09/19/2015  . Spherocytosis (familial) ([place]) 09/17/2015  . Mixed hyperlipidemia 06/19/2015  . Benign prostatic hyperplasia with lower urinary tract symptoms 06/19/2015  . History of fracture of vertebra 11/06/2014  . History of colon polyps 08/08/2014  . Pathological fracture 11/01/2013  . Toenail fungus 01/20/2013  . Vitamin D deficiency 09/27/2012  . Primary hypothyroidism 09/27/2012  . Osteoporosis 09/27/2012  . Spherocytosis ([place]) 12/04/2011  . Skin cancer 12/04/2011   Voncille Lo, PT Certified Exercise Expert for the Aging Adult  10/19/17 11:59 AM Phone: [phone] Fax: [place] [address] [place], [place], [address] Phone: [phone]   Fax:  [phone]  Name: Eric Larsen MRN: [id number] Date of Birth: March 09, 1931

## 2017-10-21 ENCOUNTER — Ambulatory Visit: Payer: Medicare Other | Admitting: Physical Therapy

## 2017-10-21 ENCOUNTER — Encounter: Payer: Self-pay | Admitting: Physical Therapy

## 2017-10-21 DIAGNOSIS — M25551 Pain in right hip: Secondary | ICD-10-CM | POA: Diagnosis not present

## 2017-10-21 DIAGNOSIS — M25651 Stiffness of right hip, not elsewhere classified: Secondary | ICD-10-CM

## 2017-10-21 DIAGNOSIS — R2689 Other abnormalities of gait and mobility: Secondary | ICD-10-CM

## 2017-10-21 NOTE — Patient Instructions (Signed)
      Voncille Lo, PT Certified Exercise Expert for the Aging Adult  10/21/17 2:13 PM Phone: [phone] Fax: [phone]

## 2017-10-21 NOTE — Therapy (Signed)
[place], [place], [address] Phone: [phone]   Fax:  [phone]  Physical Therapy Treatment  Patient Details  Name: Eric Larsen MRN: [id number] Date of Birth: 05-03-1931 Referring Provider: Dr Earnestine Leys (switching care to Dr. Mardelle Matte)   Encounter Date: 10/21/2017  PT End of Session - 10/21/17 1332    Visit Number  13    Number of Visits  15    Date for PT Re-Evaluation  10/28/17    Authorization Type  Medicare/BCBS    PT Start Time  1332    PT Stop Time  1415    PT Time Calculation (min)  43 min    Activity Tolerance  Patient tolerated treatment well    Behavior During Therapy  [facility] for tasks assessed/performed       Past Medical History:  Diagnosis Date  . Hypothyroidism   . Osteoporosis   . Thyroid disease     Past Surgical History:  Procedure Laterality Date  . HIP FRACTURE SURGERY    . INTRAMEDULLARY (IM) NAIL INTERTROCHANTERIC Right 08/13/2017   Procedure: INTRAMEDULLARY (IM) NAIL INTERTROCHANTRIC;  Surgeon: Earnestine Leys, MD;  Location: [facility] ORS;  Service: Orthopedics;  Laterality: Right;  . SHOULDER SURGERY      There were no vitals filed for this visit.  Subjective Assessment - 10/21/17 1345    Subjective  doing well ,  walking with cane most of the time    Pertinent History  Lt THA    Limitations  Walking;Standing    How long can you stand comfortably?  15- 20 minutes    Patient Stated Goals  improve mobility    Currently in Pain?  No/denies                      [facility] Adult PT Treatment/Exercise - 10/21/17 1345      Transfers   Comments  performed supine to  chair on mat to simulate floor to chair transfer with cues for keeping shoulders and hips square in quadriped to prevent twisting of hips. protection of right pin.      Ambulation/Gait   Ambulation/Gait  Yes    Ambulation/Gait Assistance  5: Supervision    Ambulation Distance (Feet)  1004 Feet  in 6 minutres NOrmal for age 58 ft    Assistive device  Straight cane    Gait Pattern  Trunk flexed      Knee/Hip Exercises: Standing   Other Standing Knee Exercises  goblet squat 2 x 10 reps, 5 lb, 1set 10 rep with 10 lb      Knee/Hip Exercises: Supine   Bridges with Ball Squeeze  2 sets;15 reps    Bridges with Clamshell  15 reps;1 set green t band     Single Leg Bridge  Both;1 set;10 reps    Other Supine Knee/Hip Exercises  physioball bridge x 12             PT Education - 10/21/17 1432    Education provided  Yes    Education Details  added HEP single limb bridge, 3 way bridge, physioball bridge with straight legs    Person(s) Educated  Patient    Methods  Explanation;Demonstration;Verbal cues;Tactile cues;Handout    Comprehension  Verbalized understanding;Returned demonstration          PT Long Term Goals - 10/21/17 1334      PT LONG TERM GOAL #1   Title  independent with HEP  Baseline  finalizing HEP for DC on 10/28/17    Time  8    Period  Weeks    Status  On-going      PT LONG TERM GOAL #2   Title  amb > 250' with LRAD modified independent for improved mobility    Baseline  10/21/17 supervision ambulation for 1016ft LRAD    Time  8    Period  Weeks    Status  Achieved      PT LONG TERM GOAL #3   Title  pt will report ability to walk > 20 min without increase in pain for improved function    Baseline  able to stand for 15 to 20 minutes    Time  8    Period  Weeks    Status  On-going      PT LONG TERM GOAL #4   Title  report pain < 2/10 with activity for improved function    Baseline  10/21/17 no pain after floor to mat transfer simulation and exercises    Time  8    Period  Weeks    Status  Achieved      PT LONG TERM GOAL #5   Title  Pt will be able to perform sit to stand 10 x in 30 seconds with good form and no compensations    Baseline  Pt began sit to stand with goblet squat with 5 lb weights and now 10 lb wt    Time  8    Period  Weeks     Status  On-going            Plan - 10/21/17 1448    Clinical Impression Statement  Pt educated on simulation of floor to mat transfer with VC and TC to execute without twisting involved hips. Pt able to perform 6 minute walking for 1004 feet, (normal for 82 yo is 1572 ft).  Pt added more challenging HEP in supine with physioball and bridging.  Pt prefers not to be on sides . Pt also has standing HEP. goals 2 and 4 achieved.  denies pain and can walk > 250 ft with cane.    Rehab Potential  Good    PT Frequency  2x / week    PT Duration  4 weeks    PT Treatment/Interventions  ADLs/Self Care Home Management;Cryotherapy;Electrical Stimulation;Moist Heat;Therapeutic exercise;Therapeutic activities;Functional mobility training;Stair training;Gait training;DME Instruction;Ultrasound;Balance training;Neuromuscular re-education;Patient/family education;Manual techniques;Taping;Passive range of motion;Scar mobilization    PT Next Visit Plan  gait with cane all times .  dynamic gait activities; posture and hip strengthening/balance activities; consider d/c planning, outside, environments D/C 10-28-17 Maximize strength and function    PT Home Exercise Plan  4 way standing SLR, bridge, SLR,  step ups , forward, lateral and step downs, sit to stand goblet squats. supine Single limb bridge, physioball bridge, 3 way bridge    Consulted and Agree with Plan of Care  Patient    Family Member Consulted  wife       Patient will benefit from skilled therapeutic intervention in order to improve the following deficits and impairments:  Abnormal gait, Decreased balance, Decreased range of motion, Decreased mobility, Difficulty walking, Postural dysfunction, Decreased strength, Pain  Visit Diagnosis: Pain in right hip  Stiffness of right hip, not elsewhere classified  Other abnormalities of gait and mobility     Problem List Patient Active Problem List   Diagnosis Date Noted  . Hip fracture ([place])  08/12/2017  .  Hallux valgus of left foot 04/28/2017  . Hammer toe of left foot 04/28/2017  . Corn of toe 04/28/2017  . History of total left hip replacement 09/19/2015  . Spherocytosis (familial) ([place]) 09/17/2015  . Mixed hyperlipidemia 06/19/2015  . Benign prostatic hyperplasia with lower urinary tract symptoms 06/19/2015  . History of fracture of vertebra 11/06/2014  . History of colon polyps 08/08/2014  . Pathological fracture 11/01/2013  . Toenail fungus 01/20/2013  . Vitamin D deficiency 09/27/2012  . Primary hypothyroidism 09/27/2012  . Osteoporosis 09/27/2012  . Spherocytosis ([place]) 12/04/2011  . Skin cancer 12/04/2011   Voncille Lo, PT Certified Exercise Expert for the Aging Adult  10/21/17 2:57 PM Phone: [phone] Fax: [place] [facility] [address] [place], [place], [address] Phone: [phone]   Fax:  [phone]  Name: Eric Larsen MRN: [id number] Date of Birth: 1931-04-18

## 2017-10-26 ENCOUNTER — Ambulatory Visit: Payer: Medicare Other | Admitting: Physical Therapy

## 2017-10-26 ENCOUNTER — Encounter: Payer: Self-pay | Admitting: Physical Therapy

## 2017-10-26 DIAGNOSIS — M25551 Pain in right hip: Secondary | ICD-10-CM | POA: Diagnosis not present

## 2017-10-26 DIAGNOSIS — M25651 Stiffness of right hip, not elsewhere classified: Secondary | ICD-10-CM

## 2017-10-26 DIAGNOSIS — R2689 Other abnormalities of gait and mobility: Secondary | ICD-10-CM

## 2017-10-26 NOTE — Therapy (Signed)
[place], [place], [address] Phone: [phone]   Fax:  [phone]  Physical Therapy Treatment  Patient Details  Name: Eric Larsen MRN: [id number] Date of Birth: 1930-09-10 Referring Provider: Dr Earnestine Leys (switching care to Dr. Mardelle Matte)   Encounter Date: 10/26/2017  PT End of Session - 10/26/17 1339    Visit Number  14    Number of Visits  15    Date for PT Re-Evaluation  10/28/17    Authorization Type  Medicare/BCBS    PT Start Time  1335 pt arrived late    PT Stop Time  1412    PT Time Calculation (min)  37 min    Equipment Utilized During Treatment  Gait belt    Activity Tolerance  Patient tolerated treatment well    Behavior During Therapy  [facility] for tasks assessed/performed       Past Medical History:  Diagnosis Date  . Hypothyroidism   . Osteoporosis   . Thyroid disease     Past Surgical History:  Procedure Laterality Date  . HIP FRACTURE SURGERY    . INTRAMEDULLARY (IM) NAIL INTERTROCHANTERIC Right 08/13/2017   Procedure: INTRAMEDULLARY (IM) NAIL INTERTROCHANTRIC;  Surgeon: Earnestine Leys, MD;  Location: [facility] ORS;  Service: Orthopedics;  Laterality: Right;  . SHOULDER SURGERY      There were no vitals filed for this visit.  Subjective Assessment - 10/26/17 1339    Subjective  denies any pain or use of walker.     Patient Stated Goals  improve mobility    Currently in Pain?  No/denies                      [facility] Adult PT Treatment/Exercise - 10/26/17 0001      Therapeutic Activites    Therapeutic Activities  ADL's    ADL's  stairs      Knee/Hip Exercises: Aerobic   Nustep  5 min L5 LE only      Knee/Hip Exercises: Standing   SLS  bilat in parallel bars    Other Standing Knee Exercises  tandem gait fwd & retro    Other Standing Knee Exercises  wobble board A/P & lateral, static & dynamic; fast walking to quick stops                  PT  Long Term Goals - 10/21/17 1334      PT LONG TERM GOAL #1   Title  independent with HEP    Baseline  finalizing HEP for DC on 10/28/17    Time  8    Period  Weeks    Status  On-going      PT LONG TERM GOAL #2   Title  amb > 250' with LRAD modified independent for improved mobility    Baseline  10/21/17 supervision ambulation for 1061ft LRAD    Time  8    Period  Weeks    Status  Achieved      PT LONG TERM GOAL #3   Title  pt will report ability to walk > 20 min without increase in pain for improved function    Baseline  able to stand for 15 to 20 minutes    Time  8    Period  Weeks    Status  On-going      PT LONG TERM GOAL #4   Title  report pain < 2/10 with activity for improved function  Baseline  10/21/17 no pain after floor to mat transfer simulation and exercises    Time  8    Period  Weeks    Status  Achieved      PT LONG TERM GOAL #5   Title  Pt will be able to perform sit to stand 10 x in 30 seconds with good form and no compensations    Baseline  Pt began sit to stand with goblet squat with 5 lb weights and now 10 lb wt    Time  8    Period  Weeks    Status  On-going            Plan - 10/26/17 1600    Clinical Impression Statement  utilized wobble board to challenge reactive balance which was difficult for patient. Discussed balance challenges in a corner with use of a chair for safety. Practiced stairs and I asked him to carry his cane rather than touch steps because he was safer with just use of hand rail. Advised to make note of any questions before d/c next visit.     PT Treatment/Interventions  ADLs/Self Care Home Management;Cryotherapy;Electrical Stimulation;Moist Heat;Therapeutic exercise;Therapeutic activities;Functional mobility training;Stair training;Gait training;DME Instruction;Ultrasound;Balance training;Neuromuscular re-education;Patient/family education;Manual techniques;Taping;Passive range of motion;Scar mobilization    PT Next Visit Plan   establish final HEP & d/c    PT Home Exercise Plan  4 way standing SLR, bridge, SLR,  step ups , forward, lateral and step downs, sit to stand goblet squats. supine Single limb bridge, physioball bridge, 3 way bridge    Consulted and Agree with Plan of Care  Patient    Family Member Consulted  wife       Patient will benefit from skilled therapeutic intervention in order to improve the following deficits and impairments:  Abnormal gait, Decreased balance, Decreased range of motion, Decreased mobility, Difficulty walking, Postural dysfunction, Decreased strength, Pain  Visit Diagnosis: Pain in right hip  Stiffness of right hip, not elsewhere classified  Other abnormalities of gait and mobility     Problem List Patient Active Problem List   Diagnosis Date Noted  . Hip fracture ([place]) 08/12/2017  . Hallux valgus of left foot 04/28/2017  . Hammer toe of left foot 04/28/2017  . Corn of toe 04/28/2017  . History of total left hip replacement 09/19/2015  . Spherocytosis (familial) ([place]) 09/17/2015  . Mixed hyperlipidemia 06/19/2015  . Benign prostatic hyperplasia with lower urinary tract symptoms 06/19/2015  . History of fracture of vertebra 11/06/2014  . History of colon polyps 08/08/2014  . Pathological fracture 11/01/2013  . Toenail fungus 01/20/2013  . Vitamin D deficiency 09/27/2012  . Primary hypothyroidism 09/27/2012  . Osteoporosis 09/27/2012  . Spherocytosis ([place]) 12/04/2011  . Skin cancer 12/04/2011    Terrianne Cavness C. Shalee Paolo PT, DPT 10/26/17 4:04 PM   [facility] [address] [place], [place], [address] Phone: [phone]   Fax:  [phone]  Name: Sergi Gellner MRN: [id number] Date of Birth: [birth year]

## 2017-10-28 ENCOUNTER — Ambulatory Visit: Payer: Medicare Other | Admitting: Physical Therapy

## 2017-10-28 ENCOUNTER — Encounter: Payer: Self-pay | Admitting: Physical Therapy

## 2017-10-28 DIAGNOSIS — M25651 Stiffness of right hip, not elsewhere classified: Secondary | ICD-10-CM

## 2017-10-28 DIAGNOSIS — M25551 Pain in right hip: Secondary | ICD-10-CM | POA: Diagnosis not present

## 2017-10-28 DIAGNOSIS — R2689 Other abnormalities of gait and mobility: Secondary | ICD-10-CM

## 2017-10-28 DIAGNOSIS — R262 Difficulty in walking, not elsewhere classified: Secondary | ICD-10-CM

## 2017-10-28 DIAGNOSIS — R293 Abnormal posture: Secondary | ICD-10-CM

## 2017-10-28 NOTE — Patient Instructions (Signed)
These are both hip flexor stretches and you can graduate to the second one when the first is too easy . Remember to engage abdomen before stretch. No arching of back.  Hip Flexor Stretch   Lying on back near edge of bed, bend one leg, foot flat. Hang other leg over edge, relaxed, thigh resting entirely on bed for __3__ minutes. Repeat on each side  Advanced Exercise: Bend knee back keeping thigh in contact with bed.  [email or web address].[email or web address]   HIP: Flexors - Supine    Lying on the back near the edge of bed, Bend one knee on right to chest as the left leg is off of the bed.  Try to bend knee for added hip flexor stretch.  Lie for 3 minutes . Repeat on other side.  Do not arch back  Voncille Lo, PT Certified Exercise Expert for the Aging Adult  10/28/17 2:01 PM Phone: [phone] Fax: [phone]

## 2017-10-28 NOTE — Therapy (Signed)
[place], [place], [address] Phone: [phone]   Fax:  [phone]  Physical Therapy Treatment/Discharge Note  Patient Details  Name: Eric Larsen MRN: [id number] Date of Birth: 06-Jul-1931 Referring Provider: Dr Eric Larsen   Encounter Date: 10/28/2017  PT End of Session - 10/28/17 1444    Visit Number  15    Number of Visits  15    Date for PT Re-Evaluation  10/28/17    Authorization Type  Medicare/BCBS    PT Start Time  1330    PT Stop Time  1420    PT Time Calculation (min)  50 min    Activity Tolerance  Patient tolerated treatment well    Behavior During Therapy  [facility] for tasks assessed/performed       Past Medical History:  Diagnosis Date  . Hypothyroidism   . Osteoporosis   . Thyroid disease     Past Surgical History:  Procedure Laterality Date  . HIP FRACTURE SURGERY    . INTRAMEDULLARY (IM) NAIL INTERTROCHANTERIC Right 08/13/2017   Procedure: INTRAMEDULLARY (IM) NAIL INTERTROCHANTRIC;  Surgeon: Eric Leys, MD;  Location: [facility] ORS;  Service: Orthopedics;  Laterality: Right;  . SHOULDER SURGERY      There were no vitals filed for this visit.  Subjective Assessment - 10/28/17 1337    Subjective  Pt denies any pain.  I am able to use the cane.  I dont use on the stairs    Limitations  Walking;Standing    How long can you stand comfortably?  15 - 20 minutes     Patient Stated Goals  improve mobility    Currently in Pain?  No/denies         [facility] PT Assessment - 10/28/17 1429      Assessment   Referring Provider  Dr Eric Larsen      PROM   Right Hip External Rotation   24    Right Hip Internal Rotation   35    Left Hip External Rotation   40    Left Hip Internal Rotation   35      Strength   Right Hip Flexion  4+/5    Right Hip Extension  4-/5    Right Hip ABduction  3-/5    Left Hip Flexion  4+/5    Left Hip Extension  4/5    Left Hip ABduction  4-/5      Right Hip   Right Hip Flexion  109    Right Hip ABduction  40      Transfers   Comments  performed supine to  chair on mat to simulate floor to chair transfer with cues for keeping shoulders and hips square in quadriped to prevent twisting of hips. protection of right pin. reviewed how to perform mat transfer from last week      Ambulation/Gait   Ambulation/Gait  Yes    Ambulation/Gait Assistance  5: Supervision    Ambulation Distance (Feet)  1386 Feet Normal for age 53 ft    Assistive device  Straight cane    Gait Pattern  Trunk flexed    Ambulation Surface  Unlevel;Indoor;Outdoor;Grass;Level;Paved    Gait velocity  3.86 Ft/sec  community level ambulator at least 3.62 ft/sec to 4.37 ft/se      6 Minute Walk- Baseline   6 Minute Walk- Baseline  yes                  [facility] Adult PT  Treatment/Exercise - 10/28/17 1429      Ambulation/Gait   Stairs  Yes    Stairs Assistance  6: Modified independent (Device/Increase time)    Stair Management Technique  One rail Left carries cane    Number of Stairs  8    Height of Stairs  6    Gait Comments  Pt able to alternate steps x 10 without cane,  Pt problem solving over unlevel terrain and grass and tree roots.  Able to stand and do walking exercises for 20 minutes     Self-Care   Self-Care  Other Self-Care Comments    Other Self-Care Comments   reviewed HEP with quse      Knee/Hip Exercises: Standing   Other Standing Knee Exercises  wobble board A/P & lateral, static & dynamic; fast walking to quick stops      Knee/Hip Exercises: Seated   Sit to Sand  -- 14 x in 30 seconds       Knee/Hip Exercises: Supine   Bridges  1 set;15 reps;Both;Strengthening 5 lb wt.              PT Education - 10/28/17 1437    Education provided  Yes    Education Details  added hip flexor stretches and reviewed all exercises forhome use, questions answered about progression with weights    Person(s) Educated  Patient    Methods   Explanation;Demonstration;Tactile cues;Verbal cues;Handout    Comprehension  Verbalized understanding;Returned demonstration          PT Long Term Goals - 10/28/17 1401      PT LONG TERM GOAL #1   Title  independent with HEP    Baseline  Independent with HEP and knows how to challenge by added weights    Time  8    Period  Weeks    Status  Achieved      PT LONG TERM GOAL #2   Title  amb > 250' with LRAD modified independent for improved mobility    Baseline  supervision ambulation for 1386 ft LRAD    Time  8    Period  Weeks    Status  Achieved      PT LONG TERM GOAL #3   Title  pt will report ability to walk > 20 min without increase in pain for improved function    Baseline  pt reports shopping for 20 minutes with cane or holding onto grocery cart    Time  8    Period  Weeks    Status  Achieved      PT LONG TERM GOAL #4   Title  report pain < 2/10 with activity for improved function    Baseline  reports no pain today , able to perform floor to mat transfer     Time  8    Period  Weeks    Status  Achieved      PT LONG TERM GOAL #5   Title  Pt will be able to perform sit to stand 10 x in 30 seconds with good form and no compensations    Baseline  able to perform 14 sit to stands in 30 secs    Time  8    Period  Weeks    Status  Achieved            Plan - 10/28/17 1445    Clinical Impression Statement  [name] has done an Media planner job completing and achieving all LTG's  for rehab or Right hip ORIF.   He has increased AROM in hips and MMT improved.  See Flowsheet.  Pt is able to transfer from floot to chair and uses a cane for ambulation.  Pt is able to perform sit to stand 14 x in 30 sec surpassing 10 x goal.  Pt gait velocity is 3.42f/sec.   Pt is able to negotiate steps safely and has no residual pain.  All questions answered about progressing exercises and given hip flexor stretch which was limited on the right. Will DC to home program .  Pt may want to  return in a few months to update HEP as necessary    Rehab Potential  Good    PT Frequency  2x / week    PT Duration  4 weeks    PT Treatment/Interventions  ADLs/Self Care Home Management;Cryotherapy;Electrical Stimulation;Moist Heat;Therapeutic exercise;Therapeutic activities;Functional mobility training;Stair training;Gait training;DME Instruction;Ultrasound;Balance training;Neuromuscular re-education;Patient/family education;Manual techniques;Taping;Passive range of motion;Scar mobilization    PT Next Visit Plan  DC    PT Home Exercise Plan  4 way standing SLR, bridge, SLR,  step ups , forward, lateral and step downs, sit to stand goblet squats. supine Single limb bridge, physioball bridge, 3 way bridge hi flexor stretch    Consulted and Agree with Plan of Care  Patient       Patient will benefit from skilled therapeutic intervention in order to improve the following deficits and impairments:  Abnormal gait, Decreased balance, Decreased range of motion, Decreased mobility, Difficulty walking, Postural dysfunction, Decreased strength, Pain  Visit Diagnosis: Pain in right hip  Stiffness of right hip, not elsewhere classified  Other abnormalities of gait and mobility  Abnormal posture  Difficulty in walking, not elsewhere classified     Problem List Patient Active Problem List   Diagnosis Date Noted  . [place] ([place] 08/12/2017  . Hallux valgus of left foot 04/28/2017  . Hammer toe of left foot 04/28/2017  . Corn of toe 04/28/2017  . History of total left hip replacement 09/19/2015  . [place]he[place]ial) [place] 09/17/2015  . Mixed hyperlipidemia 06/19/2015  . Benign prostatic hyperplasia with lower urinary tract symptoms 06/19/2015  . History of fracture of vertebra 11/06/2014  . History of colon polyps 08/08/2014  . Pathological fracture 11/01/2013  . Toenail fungus 01/20/2013  . Vitamin D deficiency 09/27/2012  . Primary hypothyroidism 09/27/2012  . Osteoporosis  09/27/2012  . [place]osis [place] 12/04/2011  . Skin cancer 12/04/2011    LVoncille Lo PT Certified Exercise Expert for the Aging Adult  10/28/17 2:51 PM Phone: [phone]: 3[facility] [address]Phone: [phone]  Fax:  [phone] Name: Eric MestreMRN: [id number] of Birth: [birth year]  PHYSICAL THERAPY DISCHARGE SUMMARY  Visits from Start of Care: 15  Current functional level related to goals / functional outcomes: As above   Remaining deficits: Pt with right tight hip flexor and given stretch to perform with rest of HEP   Education / Equipment: HEP Plan: Patient agrees to discharge.  Patient goals were met. Patient is being discharged due to meeting the stated rehab goals.  ?????   And being pleased with current functional level  LVoncille Lo PT Certified Exercise Expert for the Aging Adult  10/28/17 2:54 PM Phone: [phone]: [phone]

## 2018-06-20 ENCOUNTER — Encounter (INDEPENDENT_AMBULATORY_CARE_PROVIDER_SITE_OTHER): Payer: Medicare Other | Admitting: Ophthalmology

## 2018-08-01 ENCOUNTER — Encounter (INDEPENDENT_AMBULATORY_CARE_PROVIDER_SITE_OTHER): Payer: Medicare Other | Admitting: Ophthalmology

## 2018-08-01 DIAGNOSIS — H35343 Macular cyst, hole, or pseudohole, bilateral: Secondary | ICD-10-CM | POA: Diagnosis not present

## 2018-08-01 DIAGNOSIS — H43813 Vitreous degeneration, bilateral: Secondary | ICD-10-CM

## 2018-08-01 DIAGNOSIS — H2513 Age-related nuclear cataract, bilateral: Secondary | ICD-10-CM

## 2018-11-07 IMAGING — CT CT ANGIO CHEST
2 of 6 series · 18 of 46 positions shown · IV contrast (APPLIED)
Comparison: None

CLINICAL DATA: Intermittent tachycardia, elevated D-dimer, former
smoker

EXAM:
CT ANGIOGRAPHY CHEST WITH CONTRAST
TECHNIQUE: Multidetector CT imaging of the chest was performed using the
standard protocol during bolus administration of intravenous
contrast. Multiplanar CT image reconstructions and MIPs were
obtained to evaluate the vascular anatomy.
CONTRAST:  75mL I2XRIL-3NV IOPAMIDOL (I2XRIL-3NV) INJECTION 76% IV

[Series 5: thins · axial · 0.67mm/px · z∈[-822,-554]mm · 16 of 294 slices shown]
[im 13/294  lung]
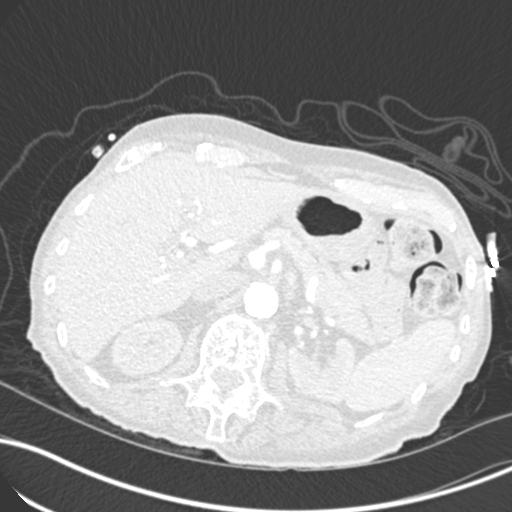
[im 39/294  soft-tissue]
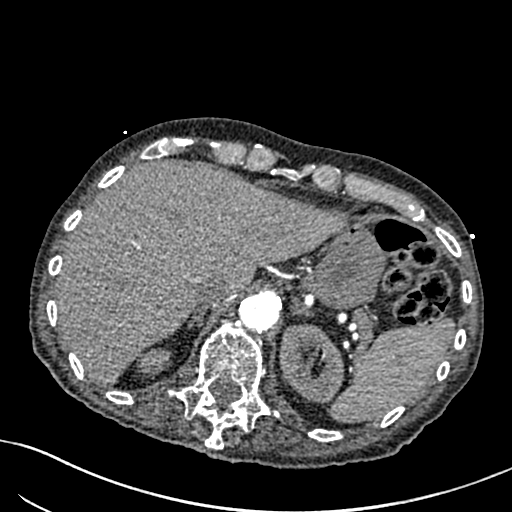
[im 51/294  lung]
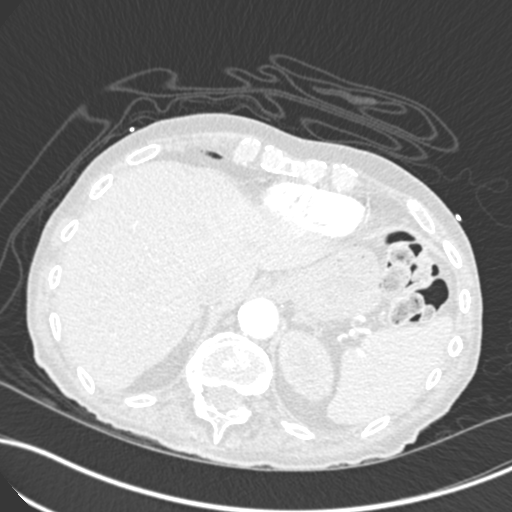
[im 64/294  soft-tissue]
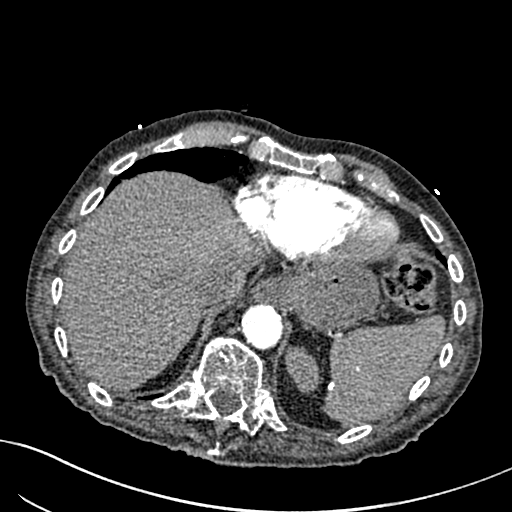
[im 90/294  lung]
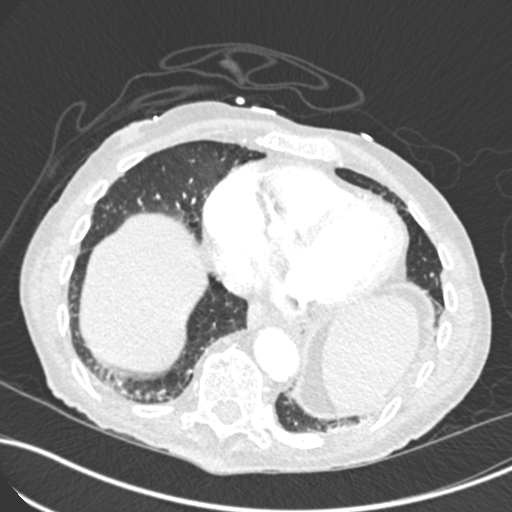
[im 102/294  soft-tissue]
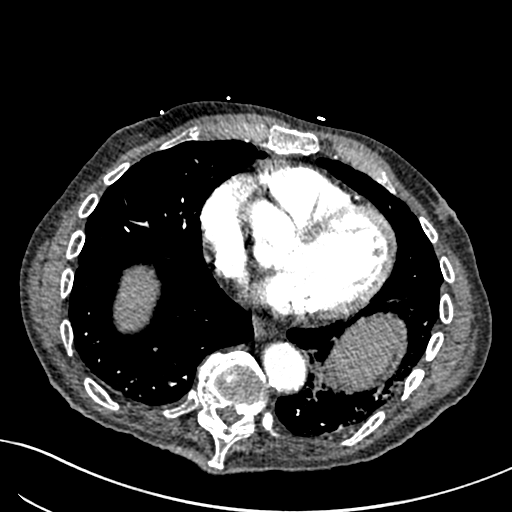
[im 115/294  lung]
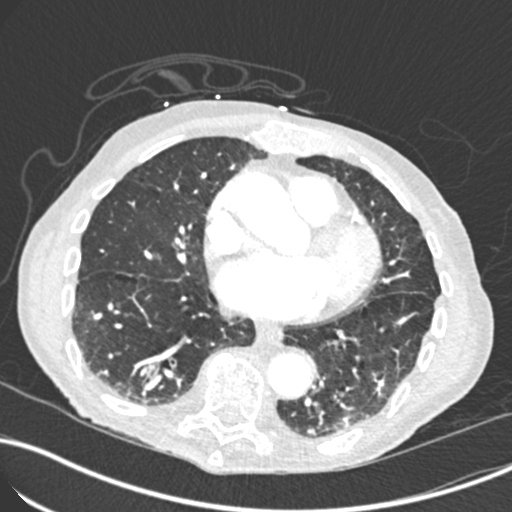
[im 141/294  soft-tissue]
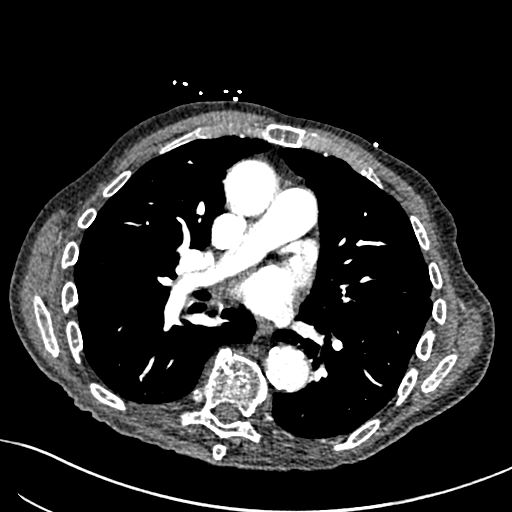
[im 153/294  lung]
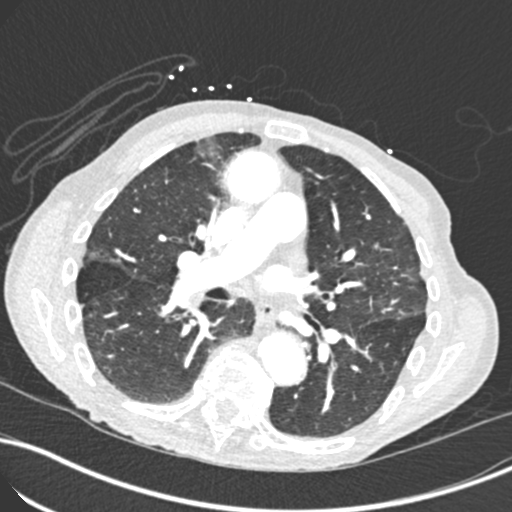
[im 179/294  soft-tissue]
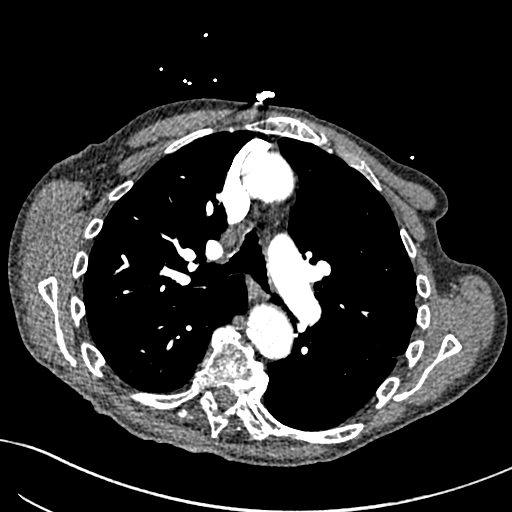
[im 192/294  lung]
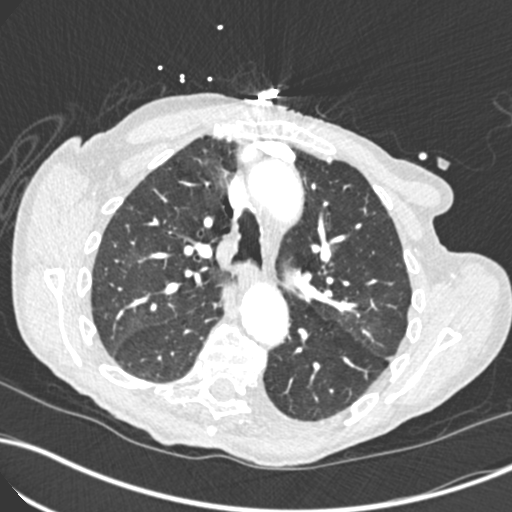
[im 204/294  soft-tissue]
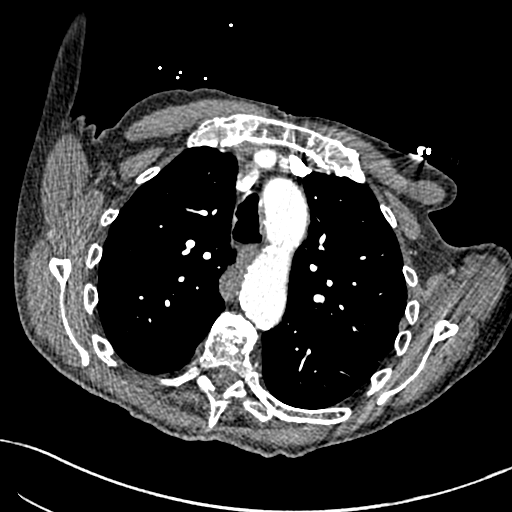
[im 230/294  lung]
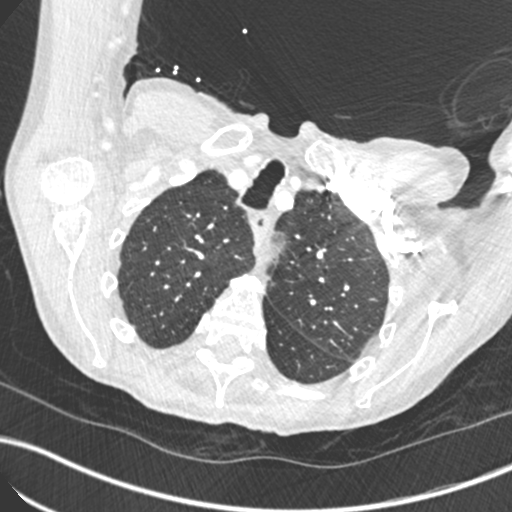
[im 243/294  soft-tissue]
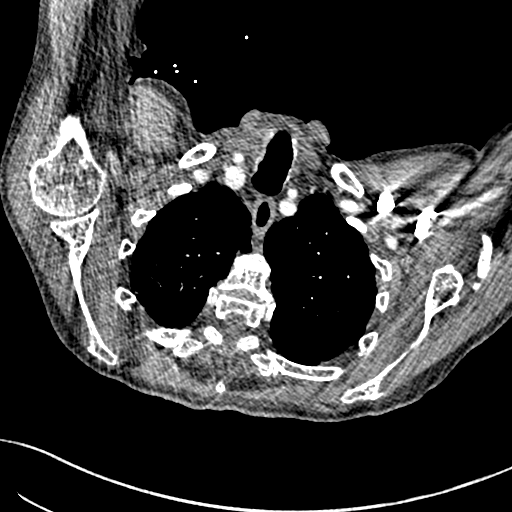
[im 255/294  lung]
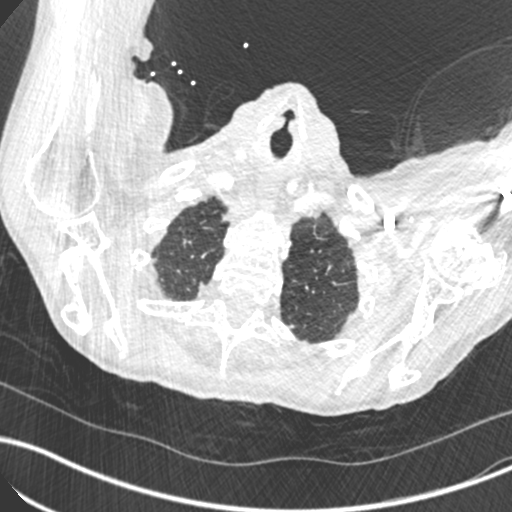
[im 281/294  soft-tissue]
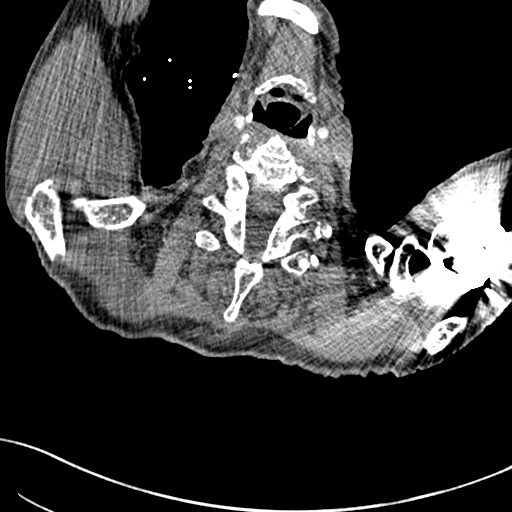

[Series 7: coronal mpr · coronal · 0.59mm/px · 2 of 84 slices shown]
[im 28/84  soft-tissue]
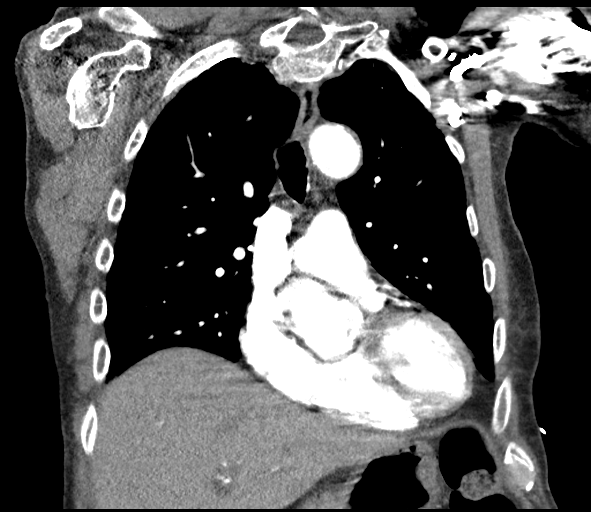
[im 56/84  soft-tissue]
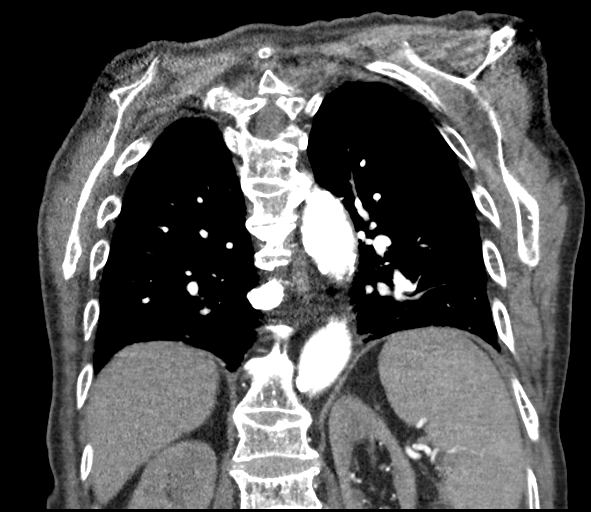

[18 of 46 positions shown; findings below may reference images not displayed]

FINDINGS: Cardiovascular: Atherosclerotic calcifications of aorta, proximal
great vessels and coronary arteries. Aorta normal caliber without
aneurysm or dissection. Mild enlargement of cardiac chambers. No
pericardial effusion. Pulmonary arteries adequately opacified and
patent. No evidence of pulmonary embolism.

Mediastinum/Nodes: Esophagus unremarkable. Base of cervical region
normal appearance. No thoracic adenopathy.

Lungs/Pleura: Mild emphysematous changes. Dependent atelectasis in
the posterior lungs. Minimal scarring or atelectasis in
paramediastinal RIGHT upper lobe. Calcified granuloma at base of
RIGHT middle lobe image 74. Nonspecific 5 mm RIGHT lower lobe nodule
near diaphragm image 65. No acute infiltrate, pleural effusion or
pneumothorax.

Upper Abdomen: Small cyst at mid LEFT kidney. Aneurysmal dilatation
of the celiac artery 13 mm diameter image 92. Remaining visualized
upper abdomen unremarkable.

Musculoskeletal: Osseous demineralization with chronic appearing
endplate compression fractures of numerous thoracic vertebral
bodies. Old healed mid sternal fracture. Old healed LEFT rib
fractures.

Review of the MIP images confirms the above findings.
IMPRESSION: No evidence of pulmonary embolism.

Enlargement of cardiac silhouette with extensive atherosclerotic
changes including coronary arteries.

Aneurysmal dilatation of the celiac artery 13 mm diameter.

Emphysematous and granulomatous disease changes with dependent
atelectasis and a nonspecific 5 mm RIGHT lower lobe nodule,
recommendation below.

No follow-up needed if patient is low-risk. Non-contrast chest CT
can be considered in 12 months if patient is high-risk. This
recommendation follows the consensus statement: Guidelines for
Management of Incidental Pulmonary Nodules Detected on CT Images:

Aortic Atherosclerosis (1QRQE-K1D.D) and Emphysema (1QRQE-IB7.F).

## 2019-02-08 ENCOUNTER — Encounter (INDEPENDENT_AMBULATORY_CARE_PROVIDER_SITE_OTHER): Payer: Medicare Other | Admitting: Ophthalmology

## 2019-02-08 ENCOUNTER — Other Ambulatory Visit: Payer: Self-pay

## 2019-02-08 DIAGNOSIS — H35353 Cystoid macular degeneration, bilateral: Secondary | ICD-10-CM | POA: Diagnosis not present

## 2019-02-08 DIAGNOSIS — H43813 Vitreous degeneration, bilateral: Secondary | ICD-10-CM | POA: Diagnosis not present

## 2019-02-08 DIAGNOSIS — H353131 Nonexudative age-related macular degeneration, bilateral, early dry stage: Secondary | ICD-10-CM

## 2019-07-05 ENCOUNTER — Emergency Department (HOSPITAL_COMMUNITY)
Admission: EM | Admit: 2019-07-05 | Discharge: 2019-07-05 | Disposition: A | Discharge status: Home / Self Care | Payer: Medicare Other | Attending: Emergency Medicine | Admitting: Emergency Medicine

## 2019-07-05 ENCOUNTER — Other Ambulatory Visit: Payer: Self-pay

## 2019-07-05 ENCOUNTER — Emergency Department (HOSPITAL_COMMUNITY): Payer: Medicare Other

## 2019-07-05 ENCOUNTER — Encounter (HOSPITAL_COMMUNITY): Payer: Self-pay

## 2019-07-05 DIAGNOSIS — Y939 Activity, unspecified: Secondary | ICD-10-CM | POA: Insufficient documentation

## 2019-07-05 DIAGNOSIS — Y999 Unspecified external cause status: Secondary | ICD-10-CM | POA: Insufficient documentation

## 2019-07-05 DIAGNOSIS — S0101XA Laceration without foreign body of scalp, initial encounter: Secondary | ICD-10-CM | POA: Diagnosis present

## 2019-07-05 DIAGNOSIS — Z79899 Other long term (current) drug therapy: Secondary | ICD-10-CM | POA: Diagnosis not present

## 2019-07-05 DIAGNOSIS — Z96642 Presence of left artificial hip joint: Secondary | ICD-10-CM | POA: Diagnosis not present

## 2019-07-05 DIAGNOSIS — Y929 Unspecified place or not applicable: Secondary | ICD-10-CM | POA: Insufficient documentation

## 2019-07-05 DIAGNOSIS — E039 Hypothyroidism, unspecified: Secondary | ICD-10-CM | POA: Insufficient documentation

## 2019-07-05 DIAGNOSIS — W2209XA Striking against other stationary object, initial encounter: Secondary | ICD-10-CM | POA: Insufficient documentation

## 2019-07-05 MED ORDER — LIDOCAINE-EPINEPHRINE 2 %-1:100000 IJ SOLN
20.0000 mL | Freq: Once | INTRAMUSCULAR | Status: AC
  Administered 2019-07-05: 21:00:00 20 mL
  Filled 2019-07-05: qty 1

## 2019-07-05 NOTE — ED Provider Notes (Signed)
[facility] DEPT Provider Note   CSN: [id number] Arrival date & time: 07/05/19  1918     History   Chief Complaint No chief complaint on file.   HPI Eric Larsen is a 85 y.o. male.  Presents to ER after a potty chair hit him on his head and suffered laceration.  Patient denied any loss consciousness, states bleeding stopped prior to arrival.  No other symptoms at this time.  Up-to-date on tetanus.  No anticoagulation.     HPI  Past Medical History:  Diagnosis Date  . Hypothyroidism   . Osteoporosis   . Thyroid disease     Patient Active Problem List   Diagnosis Date Noted  . Hip fracture ([place]) 08/12/2017  . Hallux valgus of left foot 04/28/2017  . Hammer toe of left foot 04/28/2017  . Corn of toe 04/28/2017  . History of total left hip replacement 09/19/2015  . Spherocytosis (familial) ([place]) 09/17/2015  . Mixed hyperlipidemia 06/19/2015  . Benign prostatic hyperplasia with lower urinary tract symptoms 06/19/2015  . History of fracture of vertebra 11/06/2014  . History of colon polyps 08/08/2014  . Pathological fracture 11/01/2013  . Toenail fungus 01/20/2013  . Vitamin D deficiency 09/27/2012  . Primary hypothyroidism 09/27/2012  . Osteoporosis 09/27/2012  . Spherocytosis ([place]) 12/04/2011  . Skin cancer 12/04/2011    Past Surgical History:  Procedure Laterality Date  . HIP FRACTURE SURGERY    . INTRAMEDULLARY (IM) NAIL INTERTROCHANTERIC Right 08/13/2017   Procedure: INTRAMEDULLARY (IM) NAIL INTERTROCHANTRIC;  Surgeon: Earnestine Leys, MD;  Location: [facility] ORS;  Service: Orthopedics;  Laterality: Right;  . SHOULDER SURGERY          Home Medications    Prior to Admission medications   Medication Sig Start Date End Date Taking? Authorizing Provider  acetaminophen (TYLENOL) 325 MG tablet Take 2 tablets (650 mg total) by mouth every 6 (six) hours as needed for mild pain or headache (or Fever >/= 101). 08/16/17   Loletha Grayer, MD  enoxaparin (LOVENOX) 40 MG/0.4ML injection Inject 0.4 mLs (40 mg total) into the skin daily for 14 days. 08/17/17 08/31/17  Loletha Grayer, MD  ferrous sulfate 325 (65 FE) MG tablet Take 1 tablet (325 mg total) by mouth daily with breakfast. Patient not taking: Reported on 09/07/2017 08/17/17   Loletha Grayer, MD  folic acid (FOLVITE) Q000111Q MCG tablet Take 800 mcg by mouth daily.    [provider]  Folic Acid-Vit Q000111Q 123456 (FOLBEE) 2.5-25-1 MG TABS tablet Take 1 tablet by mouth daily.    [provider]  HYDROcodone-acetaminophen (NORCO/VICODIN) 5-325 MG tablet Take 1 tablet by mouth every 4 (four) hours as needed for moderate pain or severe pain. Patient not taking: Reported on 09/07/2017 08/16/17   Loletha Grayer, MD  metoprolol tartrate (LOPRESSOR) 25 MG tablet Take 0.5 tablets (12.5 mg total) by mouth 2 (two) times daily. Patient not taking: Reported on 09/07/2017 08/16/17   Loletha Grayer, MD  polyethylene glycol [facility] / Floria Raveling) packet Take 17 g by mouth daily. Patient not taking: Reported on 09/07/2017 08/16/17   Loletha Grayer, MD  senna (SENOKOT) 8.6 MG TABS tablet Take 1 tablet (8.6 mg total) by mouth 2 (two) times daily. Patient not taking: Reported on 09/07/2017 08/16/17   Loletha Grayer, MD  SYNTHROID 112 MCG tablet Take 112 mcg by mouth daily before breakfast.    [provider]    Family History History reviewed. No pertinent family history.  Social History Social History  Tobacco Use  . Smoking status: Never Smoker  . Smokeless tobacco: Never Used  Substance Use Topics  . Alcohol use: Yes    Comment: social  . Drug use: No     Allergies   Patient has no known allergies.   Review of Systems Review of Systems  Constitutional: Negative for chills and fever.  HENT: Negative for ear pain and sore throat.   Eyes: Negative for pain and visual disturbance.  Respiratory: Negative for cough and shortness of breath.    Cardiovascular: Negative for chest pain and palpitations.  Gastrointestinal: Negative for abdominal pain and vomiting.  Genitourinary: Negative for dysuria and hematuria.  Musculoskeletal: Negative for arthralgias and back pain.  Skin: Negative for color change and rash.  Neurological: Negative for seizures and syncope.  All other systems reviewed and are negative.    Physical Exam Updated Vital Signs BP 132/65 (BP Location: Left Arm)   Pulse 62   Temp (!) 97.5 F (36.4 C) (Oral)   Resp 17   Ht 5\' 6"  (1.676 m)   Wt 61.2 kg   SpO2 100%   BMI 21.79 kg/m   Physical Exam Vitals signs and nursing note reviewed.  Constitutional:      Appearance: He is well-developed.  HENT:     Head: Normocephalic.     Comments: 1 cm laceration on top of occiput Eyes:     Conjunctiva/sclera: Conjunctivae normal.  Neck:     Musculoskeletal: Neck supple.  Cardiovascular:     Rate and Rhythm: Normal rate and regular rhythm.     Heart sounds: No murmur.  Pulmonary:     Effort: Pulmonary effort is normal. No respiratory distress.     Breath sounds: Normal breath sounds.  Abdominal:     Palpations: Abdomen is soft.     Tenderness: There is no abdominal tenderness.  Skin:    General: Skin is warm and dry.  Neurological:     Mental Status: He is alert.      ED Treatments / Results  Labs (all labs ordered are listed, but only abnormal results are displayed) Labs Reviewed - No data to display  EKG None  Radiology No results found.  Procedures .Marland KitchenLaceration Repair  Date/Time: 07/06/2019 12:14 AM Performed by: Lucrezia Starch, MD Authorized by: Lucrezia Starch, MD   Consent:    Consent obtained:  Verbal   Consent given by:  Patient   Risks discussed:  Infection, need for additional repair, nerve damage and pain   Alternatives discussed:  No treatment Laceration details:    Location:  Scalp   Scalp location:  Frontal   Length (cm):  1 Repair type:    Repair type:   Simple Treatment:    Area cleansed with:  Betadine   Amount of cleaning:  Standard   Irrigation solution:  Sterile saline Skin repair:    Repair method:  Staples   Number of staples:  2 Approximation:    Approximation:  Close Post-procedure details:    Patient tolerance of procedure:  Tolerated well, no immediate complications   (including critical care time)  Medications Ordered in ED Medications - No data to display   Initial Impression / Assessment and Plan / ED Course  I have reviewed the triage vital signs and the nursing notes.  Pertinent labs & imaging results that were available during my care of the patient were reviewed by me and considered in my medical decision making (see chart for details).  85 year old male presents to ER with scalp laceration.  No other trauma noted on exam, he was well-appearing with normal vital signs.  CT head negative.  Performed laceration repair with staples.  Recommended follow-up with his primary doctor in 7 days for staple removal or return to ER.    After the discussed management above, the patient was determined to be safe for discharge.  The patient was in agreement with this plan and all questions regarding their care were answered.  ED return precautions were discussed and the patient will return to the ED with any significant worsening of condition.    Final Clinical Impressions(s) / ED Diagnoses   Final diagnoses:  Laceration of scalp, initial encounter    ED Discharge Orders    None       Lucrezia Starch, MD 07/06/19 [phone]

## 2019-07-05 NOTE — ED Notes (Signed)
Pt transported to CT ?

## 2019-07-05 NOTE — ED Triage Notes (Signed)
Pt states that he had a potty chair hanging up and it fell ans hit him in the head, he has a small laceration to the top of his head, bleeding controlled at present

## 2019-07-05 NOTE — Discharge Instructions (Signed)
Go to your primary doctor or return to ER in 7 days for staple removal.  If you develop any redness, purulent drainage, fever, please return for reassessment.  Can run gentle water, no scrubbing, no soaking.

## 2019-08-03 ENCOUNTER — Encounter (INDEPENDENT_AMBULATORY_CARE_PROVIDER_SITE_OTHER): Payer: Medicare Other | Admitting: Ophthalmology

## 2019-09-10 ENCOUNTER — Ambulatory Visit: Payer: Medicare Other | Attending: Internal Medicine

## 2019-09-10 DIAGNOSIS — Z23 Encounter for immunization: Secondary | ICD-10-CM | POA: Insufficient documentation

## 2019-09-10 NOTE — Progress Notes (Signed)
   Covid-19 Vaccination Clinic  Name:  Eric Larsen    MRN: [id number] DOB: 21-May-1931  09/10/2019  [name] was observed post Covid-19 immunization for 15 minutes without incidence. He was provided with Vaccine Information Sheet and instruction to access the V-Safe system.   [name] was instructed to call 911 with any severe reactions post vaccine: Marland Kitchen Difficulty breathing  . Swelling of your face and throat  . A fast heartbeat  . A bad rash all over your body  . Dizziness and weakness    Immunizations Administered    Name Date Dose VIS Date Route   Pfizer COVID-19 Vaccine 09/10/2019  1:14 PM 0.3 mL 07/28/2019 Intramuscular   Manufacturer: [place]   Lot: [id number]   [place]: [id number]

## 2019-10-02 ENCOUNTER — Ambulatory Visit: Payer: Medicare Other | Attending: Internal Medicine

## 2019-10-02 DIAGNOSIS — Z23 Encounter for immunization: Secondary | ICD-10-CM | POA: Insufficient documentation

## 2019-10-02 NOTE — Progress Notes (Signed)
   Covid-19 Vaccination Clinic  Name:  Eric Larsen    MRN: [id number] DOB: [birth year]  10/02/2019  Eric Larsen was observed post Covid-19 immunization for 15 minutes without incidence. He was provided with Vaccine Information Sheet and instruction to access the V-Safe system.   Eric Larsen was instructed to call 911 with any severe reactions post vaccine: Marland Kitchen Difficulty breathing  . Swelling of your face and throat  . A fast heartbeat  . A bad rash all over your body  . Dizziness and weakness    Immunizations Administered    Name Date Dose VIS Date Route   Pfizer COVID-19 Vaccine 10/02/2019 12:57 PM 0.3 mL 07/28/2019 Intramuscular   Manufacturer: [place]   Lot: [id number]   [place]: [id number]

## 2019-10-14 ENCOUNTER — Other Ambulatory Visit: Payer: Self-pay

## 2019-10-14 ENCOUNTER — Emergency Department (HOSPITAL_COMMUNITY): Payer: Medicare Other

## 2019-10-14 ENCOUNTER — Encounter (HOSPITAL_COMMUNITY): Payer: Self-pay

## 2019-10-14 ENCOUNTER — Inpatient Hospital Stay (HOSPITAL_COMMUNITY)
Admission: EM | Admit: 2019-10-14 | Discharge: 2019-10-18 | DRG: 098 | Disposition: A | Discharge status: Rehabilitation Facility | Payer: Medicare Other | Attending: Internal Medicine | Admitting: Internal Medicine

## 2019-10-14 DIAGNOSIS — R4701 Aphasia: Secondary | ICD-10-CM | POA: Diagnosis present

## 2019-10-14 DIAGNOSIS — R1310 Dysphagia, unspecified: Secondary | ICD-10-CM | POA: Diagnosis present

## 2019-10-14 DIAGNOSIS — E782 Mixed hyperlipidemia: Secondary | ICD-10-CM | POA: Diagnosis present

## 2019-10-14 DIAGNOSIS — Z79899 Other long term (current) drug therapy: Secondary | ICD-10-CM | POA: Diagnosis not present

## 2019-10-14 DIAGNOSIS — F05 Delirium due to known physiological condition: Secondary | ICD-10-CM | POA: Diagnosis present

## 2019-10-14 DIAGNOSIS — D539 Nutritional anemia, unspecified: Secondary | ICD-10-CM

## 2019-10-14 DIAGNOSIS — D72820 Lymphocytosis (symptomatic): Secondary | ICD-10-CM | POA: Diagnosis present

## 2019-10-14 DIAGNOSIS — F0390 Unspecified dementia without behavioral disturbance: Secondary | ICD-10-CM | POA: Diagnosis present

## 2019-10-14 DIAGNOSIS — F0280 Dementia in other diseases classified elsewhere without behavioral disturbance: Secondary | ICD-10-CM | POA: Diagnosis not present

## 2019-10-14 DIAGNOSIS — Z85828 Personal history of other malignant neoplasm of skin: Secondary | ICD-10-CM | POA: Diagnosis not present

## 2019-10-14 DIAGNOSIS — R569 Unspecified convulsions: Secondary | ICD-10-CM | POA: Diagnosis present

## 2019-10-14 DIAGNOSIS — B004 Herpesviral encephalitis: Principal | ICD-10-CM

## 2019-10-14 DIAGNOSIS — Z79890 Hormone replacement therapy (postmenopausal): Secondary | ICD-10-CM | POA: Diagnosis not present

## 2019-10-14 DIAGNOSIS — Z86010 Personal history of colonic polyps: Secondary | ICD-10-CM | POA: Diagnosis not present

## 2019-10-14 DIAGNOSIS — Z87891 Personal history of nicotine dependence: Secondary | ICD-10-CM | POA: Diagnosis not present

## 2019-10-14 DIAGNOSIS — E876 Hypokalemia: Secondary | ICD-10-CM | POA: Diagnosis not present

## 2019-10-14 DIAGNOSIS — R9401 Abnormal electroencephalogram [EEG]: Secondary | ICD-10-CM

## 2019-10-14 DIAGNOSIS — Z20822 Contact with and (suspected) exposure to covid-19: Secondary | ICD-10-CM | POA: Diagnosis present

## 2019-10-14 DIAGNOSIS — E559 Vitamin D deficiency, unspecified: Secondary | ICD-10-CM | POA: Diagnosis present

## 2019-10-14 DIAGNOSIS — Z96642 Presence of left artificial hip joint: Secondary | ICD-10-CM | POA: Diagnosis present

## 2019-10-14 DIAGNOSIS — R531 Weakness: Secondary | ICD-10-CM | POA: Diagnosis not present

## 2019-10-14 DIAGNOSIS — D509 Iron deficiency anemia, unspecified: Secondary | ICD-10-CM | POA: Diagnosis not present

## 2019-10-14 DIAGNOSIS — Z9049 Acquired absence of other specified parts of digestive tract: Secondary | ICD-10-CM | POA: Diagnosis not present

## 2019-10-14 DIAGNOSIS — R32 Unspecified urinary incontinence: Secondary | ICD-10-CM | POA: Diagnosis present

## 2019-10-14 DIAGNOSIS — R4182 Altered mental status, unspecified: Secondary | ICD-10-CM

## 2019-10-14 DIAGNOSIS — Z79891 Long term (current) use of opiate analgesic: Secondary | ICD-10-CM | POA: Diagnosis not present

## 2019-10-14 DIAGNOSIS — G0490 Encephalitis and encephalomyelitis, unspecified: Secondary | ICD-10-CM | POA: Diagnosis not present

## 2019-10-14 DIAGNOSIS — M810 Age-related osteoporosis without current pathological fracture: Secondary | ICD-10-CM | POA: Diagnosis present

## 2019-10-14 DIAGNOSIS — R471 Dysarthria and anarthria: Secondary | ICD-10-CM | POA: Diagnosis present

## 2019-10-14 DIAGNOSIS — Z515 Encounter for palliative care: Secondary | ICD-10-CM | POA: Diagnosis not present

## 2019-10-14 DIAGNOSIS — D580 Hereditary spherocytosis: Secondary | ICD-10-CM | POA: Diagnosis present

## 2019-10-14 DIAGNOSIS — E46 Unspecified protein-calorie malnutrition: Secondary | ICD-10-CM | POA: Diagnosis not present

## 2019-10-14 DIAGNOSIS — Z9181 History of falling: Secondary | ICD-10-CM

## 2019-10-14 DIAGNOSIS — E039 Hypothyroidism, unspecified: Secondary | ICD-10-CM

## 2019-10-14 DIAGNOSIS — A419 Sepsis, unspecified organism: Secondary | ICD-10-CM | POA: Diagnosis present

## 2019-10-14 DIAGNOSIS — E785 Hyperlipidemia, unspecified: Secondary | ICD-10-CM | POA: Diagnosis not present

## 2019-10-14 DIAGNOSIS — D62 Acute posthemorrhagic anemia: Secondary | ICD-10-CM | POA: Diagnosis not present

## 2019-10-14 DIAGNOSIS — R1312 Dysphagia, oropharyngeal phase: Secondary | ICD-10-CM | POA: Diagnosis not present

## 2019-10-14 DIAGNOSIS — E8809 Other disorders of plasma-protein metabolism, not elsewhere classified: Secondary | ICD-10-CM | POA: Diagnosis not present

## 2019-10-14 DIAGNOSIS — G9340 Encephalopathy, unspecified: Secondary | ICD-10-CM | POA: Diagnosis not present

## 2019-10-14 DIAGNOSIS — G479 Sleep disorder, unspecified: Secondary | ICD-10-CM | POA: Diagnosis not present

## 2019-10-14 DIAGNOSIS — B1009 Other human herpesvirus encephalitis: Secondary | ICD-10-CM | POA: Diagnosis not present

## 2019-10-14 DIAGNOSIS — R05 Cough: Secondary | ICD-10-CM | POA: Diagnosis present

## 2019-10-14 DIAGNOSIS — G9349 Other encephalopathy: Secondary | ICD-10-CM | POA: Diagnosis present

## 2019-10-14 DIAGNOSIS — R5383 Other fatigue: Secondary | ICD-10-CM | POA: Diagnosis not present

## 2019-10-14 DIAGNOSIS — R410 Disorientation, unspecified: Secondary | ICD-10-CM | POA: Diagnosis not present

## 2019-10-14 DIAGNOSIS — R269 Unspecified abnormalities of gait and mobility: Secondary | ICD-10-CM | POA: Diagnosis not present

## 2019-10-14 LAB — CBC WITH DIFFERENTIAL/PLATELET
Abs Immature Granulocytes: 0.06 10*3/uL (ref 0.00–0.07)
Basophils Absolute: 0 10*3/uL (ref 0.0–0.1)
Basophils Relative: 1 %
Eosinophils Absolute: 0 10*3/uL (ref 0.0–0.5)
Eosinophils Relative: 0 %
HCT: 37.1 % — ABNORMAL LOW (ref 39.0–52.0)
Hemoglobin: 12.3 g/dL — ABNORMAL LOW (ref 13.0–17.0)
Immature Granulocytes: 1 %
Lymphocytes Relative: 17 %
Lymphs Abs: 1.2 10*3/uL (ref 0.7–4.0)
MCH: 34.3 pg — ABNORMAL HIGH (ref 26.0–34.0)
MCHC: 33.2 g/dL (ref 30.0–36.0)
MCV: 103.3 fL — ABNORMAL HIGH (ref 80.0–100.0)
Monocytes Absolute: 1 10*3/uL (ref 0.1–1.0)
Monocytes Relative: 13 %
Neutro Abs: 5 10*3/uL (ref 1.7–7.7)
Neutrophils Relative %: 68 %
Platelets: 206 10*3/uL (ref 150–400)
RBC: 3.59 MIL/uL — ABNORMAL LOW (ref 4.22–5.81)
RDW: 20.4 % — ABNORMAL HIGH (ref 11.5–15.5)
WBC: 7.3 10*3/uL (ref 4.0–10.5)
nRBC: 0 % (ref 0.0–0.2)

## 2019-10-14 LAB — COMPREHENSIVE METABOLIC PANEL
ALT: 22 U/L (ref 0–44)
AST: 62 U/L — ABNORMAL HIGH (ref 15–41)
Albumin: 3.8 g/dL (ref 3.5–5.0)
Alkaline Phosphatase: 51 U/L (ref 38–126)
Anion gap: 9 (ref 5–15)
BUN: 23 mg/dL (ref 8–23)
CO2: 26 mmol/L (ref 22–32)
Calcium: 8.7 mg/dL — ABNORMAL LOW (ref 8.9–10.3)
Chloride: 101 mmol/L (ref 98–111)
Creatinine, Ser: 1.06 mg/dL (ref 0.61–1.24)
GFR calc Af Amer: >60 mL/min (ref >60)
GFR calc non Af Amer: >60 mL/min (ref >60)
Glucose, Bld: 105 mg/dL — ABNORMAL HIGH (ref 70–99)
Potassium: 3.9 mmol/L (ref 3.5–5.1)
Sodium: 136 mmol/L (ref 135–145)
Total Bilirubin: 2.2 mg/dL — ABNORMAL HIGH (ref 0.3–1.2)
Total Protein: 6.2 g/dL — ABNORMAL LOW (ref 6.5–8.1)

## 2019-10-14 LAB — URINALYSIS, ROUTINE W REFLEX MICROSCOPIC
Bilirubin Urine: NEGATIVE
Glucose, UA: NEGATIVE mg/dL
Hgb urine dipstick: NEGATIVE
Ketones, ur: NEGATIVE mg/dL
Leukocytes,Ua: NEGATIVE
Nitrite: NEGATIVE
Protein, ur: NEGATIVE mg/dL
Specific Gravity, Urine: 1.016 (ref 1.005–1.030)
pH: 8 (ref 5.0–8.0)

## 2019-10-14 LAB — LACTIC ACID, PLASMA
Lactic Acid, Venous: 1.3 mmol/L (ref 0.5–1.9)
Lactic Acid, Venous: 1 mmol/L (ref 0.5–1.9)

## 2019-10-14 LAB — GLUCOSE, CAPILLARY: Glucose-Capillary: 95 mg/dL (ref 70–99)

## 2019-10-14 LAB — PROTIME-INR
INR: 1.3 — ABNORMAL HIGH (ref 0.8–1.2)
Prothrombin Time: 15.7 seconds — ABNORMAL HIGH (ref 11.4–15.2)

## 2019-10-14 LAB — SARS CORONAVIRUS 2 (TAT 6-24 HRS): SARS Coronavirus 2: NEGATIVE

## 2019-10-14 LAB — APTT: aPTT: 29 seconds (ref 24–36)

## 2019-10-14 MED ORDER — SODIUM CHLORIDE 0.9 % IV SOLN: 2.0000 g | Freq: Two times a day (BID) | INTRAVENOUS | Status: DC

## 2019-10-14 MED ORDER — SODIUM CHLORIDE 0.9 % IV SOLN
2.0000 g | Freq: Once | INTRAVENOUS | Status: AC
  Administered 2019-10-14: 2 g via INTRAVENOUS
  Filled 2019-10-14: qty 2

## 2019-10-14 MED ORDER — ACETAMINOPHEN 650 MG RE SUPP: 650.0000 mg | Freq: Four times a day (QID) | RECTAL | Status: DC | PRN

## 2019-10-14 MED ORDER — LEVOTHYROXINE SODIUM 100 MCG/5ML IV SOLN
62.5000 ug | Freq: Every day | INTRAVENOUS | Status: DC
  Administered 2019-10-14 – 2019-10-15 (×2): 62.5 ug via INTRAVENOUS
  Filled 2019-10-14 (×2): qty 5

## 2019-10-14 MED ORDER — ACETAMINOPHEN 325 MG PO TABS
650.0000 mg | ORAL_TABLET | Freq: Four times a day (QID) | ORAL | Status: DC | PRN
  Administered 2019-10-17 – 2019-10-18 (×2): 650 mg via ORAL
  Filled 2019-10-14 (×2): qty 2

## 2019-10-14 MED ORDER — VANCOMYCIN HCL IN DEXTROSE 1-5 GM/200ML-% IV SOLN
1000.0000 mg | Freq: Once | INTRAVENOUS | Status: AC
  Administered 2019-10-14: 1000 mg via INTRAVENOUS
  Filled 2019-10-14: qty 200

## 2019-10-14 MED ORDER — ENOXAPARIN SODIUM 40 MG/0.4ML ~~LOC~~ SOLN
40.0000 mg | SUBCUTANEOUS | Status: DC
  Administered 2019-10-14: 40 mg via SUBCUTANEOUS
  Filled 2019-10-14 (×2): qty 0.4

## 2019-10-14 MED ORDER — METRONIDAZOLE IN NACL 5-0.79 MG/ML-% IV SOLN
500.0000 mg | Freq: Once | INTRAVENOUS | Status: AC
  Administered 2019-10-14: 500 mg via INTRAVENOUS
  Filled 2019-10-14: qty 100

## 2019-10-14 MED ORDER — SODIUM CHLORIDE 0.9 % IV SOLN: INTRAVENOUS | Status: AC

## 2019-10-14 MED ORDER — ACETAMINOPHEN 325 MG PO TABS
650.0000 mg | ORAL_TABLET | Freq: Once | ORAL | Status: AC
  Administered 2019-10-14: 650 mg via ORAL
  Filled 2019-10-14: qty 2

## 2019-10-14 MED ORDER — VANCOMYCIN HCL 750 MG/150ML IV SOLN: 750.0000 mg | INTRAVENOUS | Status: DC

## 2019-10-14 NOTE — H&P (Addendum)
Date: 10/14/2019               Patient Name:  Eric Larsen MRN: [id number]  DOB: 06-20-1931 Age / Sex: 86 y.o., male   PCP: [name], MD         Medical Service: Internal Medicine Teaching Service         Attending Physician: Dr. Aldine Contes, MD    First Contact: Marva Panda, MD, [place] Pager: [place] [phone])  Second Contact: Sherry Ruffing, MD, [place] Pager: [place] [phone])       After Hours (After 5p/  First Contact Pager: [phone]  weekends / holidays): Second Contact Pager: [phone]   Chief Complaint: altered mental status, urinary incontinence    History of Present Illness: [name] is an  86 y.o. yo male w/ PMH significant for hypothyroidism, hyperlipidemia, hereditary spherocytosis and recent diagnosis of dementia presenting with one week of progressively worsening confusion. History obtained by wife at bedside. Patient has had mental decline since 09-03-22 when his dog passed away. However, noticeable symptoms started mostly on Sunday when he was having generalized weakness and confusion.  She notes that he had some improvement on Monday.  They went to the PCP on Tuesday who adjusted thyroid medication.  She notes that he was back to baseline on Tuesday and Wednesday.  However, on Thursday patient noted to be increasingly confused with sundowning on Thursday evening.  He had some improvement on Friday.  However this morning patient had increasing confusion for which he was brought to the hospital.  Patient noted to have a temperature of 99.7 this morning at home.  At baseline, patient uses cane to assist with ambulation; however, patient has required rolling walker due to increasing weakness and unsteady gait.  Of note, patient's wife reports that he has been having new onset urinary incontinence for the past 6 days and notes that his urine is malodorous.  She notes that he has also had increasing cough and notes that patient has had trouble eating  recently. Patient denies any headache, vision changes, lightheadedness, chest pain, shortness of breath, abdominal pain, diarrhea or rash.   In the ED, patient noted to have rectal temperature of 102.6 for which code sepsis activated. CBC with WBC 7.3, Hb/Hct 12.3/37.1, MCV 103.3, Plt 206, BMP with Na 136, K 3.9, CO2 26, BUN/Cr 23/1.06. AST/ALT/Alk phos 62/22/51 with T.bili 2.2. Lactic acid wnl. Blood cultures and urinalysis collected. CT Head and CXR without acute intracranial findings or active infection. Patient started on empiric antibitoics with vancomycin, cefepime and metronidazole. Patient admitted to internal medicine for further evaluation and management.    Meds:  Current Meds  Medication Sig  . acetaminophen (TYLENOL) 325 MG tablet Take 2 tablets (650 mg total) by mouth every 6 (six) hours as needed for mild pain or headache (or Fever >/= 101).  . calcium-vitamin D (OSCAL WITH D) 500-200 MG-UNIT TABS tablet Take 1 tablet by mouth 2 (two) times daily with a meal.  . Cholecalciferol 25 MCG (1000 UT) CHEW Chew 2,000 Units by mouth daily.  Marland Kitchen donepezil (ARICEPT) 5 MG tablet Take 1 tablet by mouth at bedtime.  . folic acid (FOLVITE) 166 MCG tablet Take 400 mcg by mouth daily.  . folic acid (FOLVITE) 063 MCG tablet Take 800 mcg by mouth daily.  . Multiple Vitamin (MULTIVITAMIN) capsule Take 1 capsule by mouth daily.  . Multiple Vitamins-Minerals (PRESERVISION AREDS PO) Take 1 tablet by mouth every evening.  . Niacin (VITAMIN B-3 PO)  Take 1 tablet by mouth daily.  Marland Kitchen SYNTHROID 125 MCG tablet Take 125 mcg by mouth daily.    Allergies: Allergies as of 10/14/2019  . (No Known Allergies)   Past Medical History:  Diagnosis Date  . Hypothyroidism   . Osteoporosis   . Thyroid disease    Past Surgical History:  Procedure Laterality Date  . HIP FRACTURE SURGERY    . INTRAMEDULLARY (IM) NAIL INTERTROCHANTERIC Right 08/13/2017   Procedure: INTRAMEDULLARY (IM) NAIL INTERTROCHANTRIC;   Surgeon: Earnestine Leys, MD;  Location: [facility] ORS;  Service: Orthopedics;  Laterality: Right;  . SHOULDER SURGERY     Family History:  Family History  Problem Relation Age of Onset  . Hereditary spherocytosis Father   . Hereditary spherocytosis Sister     Social History:  Social History   Tobacco Use  . Smoking status: Never Smoker  . Smokeless tobacco: Never Used  Substance Use Topics  . Alcohol use: Yes    Comment: one vodka tonic every evening  . Drug use: No   Patient is a retired Forensic psychologist. He lives with his wife. Appears that he has had recent decline in mentation after his dog passed away in 08-19-22. He is able to ambulate with cane at baseline; however, appears that he needs assistance with ADLs. No history of tobacco or illicit drug use. Prior history of alcohol use with one vodka tonic every evening. However, no current alcohol use.   Review of Systems: A complete ROS was negative except as per HPI.   Physical Exam: Blood pressure (!) 91/54, pulse (!) 48, temperature (!) 102.6 F (39.2 C), temperature source Rectal, resp. rate 19, height '5\' 4"'  (1.626 m), weight 53.5 kg, SpO2 99 %. Physical Exam Constitutional:      General: He is not in acute distress.    Appearance: He is not diaphoretic.  HENT:     Head: Normocephalic and atraumatic.     Mouth/Throat:     Mouth: Mucous membranes are moist.     Pharynx: Oropharynx is clear. No oropharyngeal exudate or posterior oropharyngeal erythema.  Eyes:     General: No scleral icterus.    Extraocular Movements: Extraocular movements intact.     Conjunctiva/sclera: Conjunctivae normal.     Pupils: Pupils are equal, round, and reactive to light.  Cardiovascular:     Rate and Rhythm: Normal rate and regular rhythm.     Pulses: Normal pulses.     Heart sounds: Normal heart sounds. No murmur. No friction rub. No gallop.   Pulmonary:     Effort: Pulmonary effort is normal. No respiratory distress.     Breath sounds: Normal  breath sounds. No stridor. No wheezing, rhonchi or rales.  Abdominal:     General: Abdomen is flat. Bowel sounds are normal. There is no distension.     Palpations: Abdomen is soft.     Tenderness: There is no abdominal tenderness. There is no guarding.  Musculoskeletal:        General: No swelling, tenderness, deformity or signs of injury. Normal range of motion.     Cervical back: Normal range of motion and neck supple.     Right lower leg: No edema.     Left lower leg: No edema.  Skin:    General: Skin is warm and dry.     Capillary Refill: Capillary refill takes less than 2 seconds.     Findings: No bruising, lesion or rash.  Neurological:     General: No focal deficit  present.     Mental Status: He is alert. He is disoriented.     Cranial Nerves: No cranial nerve deficit.     Motor: No weakness.     Comments: Patient oriented to self; however, unable to answer basic questions due to difficulty finding words.     CBC    Component Value Date/Time   WBC 7.3 10/14/2019 0704   RBC 3.59 (L) 10/14/2019 0704   HGB 12.3 (L) 10/14/2019 0704   HCT 37.1 (L) 10/14/2019 0704   PLT 206 10/14/2019 0704   MCV 103.3 (H) 10/14/2019 0704   MCH 34.3 (H) 10/14/2019 0704   MCHC 33.2 10/14/2019 0704   RDW 20.4 (H) 10/14/2019 0704   LYMPHSABS 1.2 10/14/2019 0704   MONOABS 1.0 10/14/2019 0704   EOSABS 0.0 10/14/2019 0704   BASOSABS 0.0 10/14/2019 0704   CMP     Component Value Date/Time   NA 136 10/14/2019 0704   K 3.9 10/14/2019 0704   CL 101 10/14/2019 0704   CO2 26 10/14/2019 0704   GLUCOSE 105 (H) 10/14/2019 0704   BUN 23 10/14/2019 0704   CREATININE 1.06 10/14/2019 0704   CALCIUM 8.7 (L) 10/14/2019 0704   PROT 6.2 (L) 10/14/2019 0704   ALBUMIN 3.8 10/14/2019 0704   AST 62 (H) 10/14/2019 0704   ALT 22 10/14/2019 0704   ALKPHOS 51 10/14/2019 0704   BILITOT 2.2 (H) 10/14/2019 0704   GFRNONAA >60 10/14/2019 0704   GFRAA >60 10/14/2019 0704   Urinalysis    Component Value  Date/Time   COLORURINE YELLOW 10/14/2019 0948   APPEARANCEUR CLOUDY (A) 10/14/2019 0948   LABSPEC 1.016 10/14/2019 0948   PHURINE 8.0 10/14/2019 0948   GLUCOSEU NEGATIVE 10/14/2019 0948   HGBUR NEGATIVE 10/14/2019 0948   BILIRUBINUR NEGATIVE 10/14/2019 0948   KETONESUR NEGATIVE 10/14/2019 0948   PROTEINUR NEGATIVE 10/14/2019 0948   NITRITE NEGATIVE 10/14/2019 0948   LEUKOCYTESUR NEGATIVE 10/14/2019 0948   EKG: personally reviewed my interpretation is sinus rhythm; no ST segment elevations; T wave inversion in V1, RBBB unchanged from prior EKG.   CXR: personally reviewed my interpretation is diffuse interstitial markings without obvious focal opacification.   CT Head wo Contrast:  IMPRESSION: 1. No acute intracranial abnormality. 2. Atrophy with chronic small vessel white matter ischemic disease.  Assessment & Plan by Problem: Mr. Teion Ballin is an 86yrold community dwelling male with PMHx of hypothyroidism and hereditary spherocytosis presenting with one week of worsening confusion, generalized weakness, urinary incontinence and worsening cough. Noted to have fever and hypotension on presentation.   Altered mental status:  Urinary incontinence:  Patient with recently diagnosed dementia presents with one week history of worsening confusion, generalized weakness and urinary incontinence. Per wife, patient started to have urinary incontinence 6 days ago with notable malodorous urine. Also noted to have gait changes. She noted temp of 99.7 at home this morning. On presentation, patient noted to have rectal temp of 102.6 with soft BP. Lactic acid wnl and no leukocytosis noted on labs. No significant electrolyte abnormalities. UA wnl. CT Head and CXR without acute findings.COVID negative. Patient received one dose of vancomycin, cefepime and flagyl in the ED. At this time, will observe off of antibiotics.  He does have macrocytic anemia despite folic acid supplementation at home.  Elevated T.bili of 2.2 with AST/ALT 62/22. Although unlikely given no signs of symptomatic anemia, could consider hemolytic anemia in setting of his hereditary spherocytosis.  - MRI brain  - EEG - F/u  blood cultures - F/u urine culture - Vitamin B12 and B1 level - Cardiac monitoring  - PT/OT evaluation  - Acetaminophen 672m q6h prn  Dysphagia: Per wife, patient has progressive dysphagia and has decreased oral intake over past week. Patient unable to pass bedside swallow test.  - SLP evaluation in AM - NPO until official SLP eval.  - Consider MRI brain for possible CVA   Dementia:  Patient with recently diagnosed dementia with several episodes of sundowning per wife. He was started on donepezil by PCP on 2/23.  - Continue to monitor - Holding donepezil in setting of dysphagia   Hypothyroidism:  Patient on levothyroxine 1265m daily (recently increased from 11243m.  - IV synthroid 62.5mc72maily  FEN/GI: Diet: NPO, pending SLP eval Fluids: NS 100 cc/hr Electrolytes: Monitor and replete prn  VTE Prophylaxis: Lovenox 40mg31mly Code status: FULL   Dispo: Admit patient to Inpatient with expected length of stay greater than 2 midnights.  Signed: AslamHarvie Heck Internal Medicine, PGY-1 Pager: [phone]/2021, 3:00 PM

## 2019-10-14 NOTE — ED Triage Notes (Signed)
Pt brought to ED via EMS from home. Wife reports pt is newly diagnosed with dementia but has become increasingly more confused over the past week and has developed a cough. Pt currently alert, denies pain, VSS.

## 2019-10-14 NOTE — ED Provider Notes (Addendum)
[facility] EMERGENCY DEPARTMENT Provider Note   CSN: [id number] Arrival date & time: 10/14/19  [id number]   History Chief Complaint  Patient presents with  . Cough  . Altered Mental Status    Eric Larsen is a 86 y.o. male.  The history is provided by the spouse. The history is limited by the condition of the patient (Dementia, altered mental status).  Cough Altered Mental Status He has history of hyperlipidemia and was recently diagnosed with dementia and is brought in by his wife because of worsening mentation.  She states that he has been getting progressively more confused over the last week.  She thought he may have run a low-grade fever today.  There has been no cough.  He has been eating normally.  There has been no vomiting or diarrhea.  He has not had any obvious urinary difficulty.  He did have a fall last November and had a CT scan which was unremarkable, no falls since then.  There has been no known exposure to COVID-19, and he had his second dose of Covid vaccine on February 15.  Past Medical History:  Diagnosis Date  . Hypothyroidism   . Osteoporosis   . Thyroid disease     Patient Active Problem List   Diagnosis Date Noted  . Hip fracture ([place]) 08/12/2017  . Hallux valgus of left foot 04/28/2017  . Hammer toe of left foot 04/28/2017  . Corn of toe 04/28/2017  . History of total left hip replacement 09/19/2015  . Spherocytosis (familial) ([place]) 09/17/2015  . Mixed hyperlipidemia 06/19/2015  . Benign prostatic hyperplasia with lower urinary tract symptoms 06/19/2015  . History of fracture of vertebra 11/06/2014  . History of colon polyps 08/08/2014  . Pathological fracture 11/01/2013  . Toenail fungus 01/20/2013  . Vitamin D deficiency 09/27/2012  . Primary hypothyroidism 09/27/2012  . Osteoporosis 09/27/2012  . Spherocytosis ([place]) 12/04/2011  . Skin cancer 12/04/2011    Past Surgical History:  Procedure Laterality Date  . HIP FRACTURE  SURGERY    . INTRAMEDULLARY (IM) NAIL INTERTROCHANTERIC Right 08/13/2017   Procedure: INTRAMEDULLARY (IM) NAIL INTERTROCHANTRIC;  Surgeon: Earnestine Leys, MD;  Location: [facility] ORS;  Service: Orthopedics;  Laterality: Right;  . SHOULDER SURGERY         No family history on file.  Social History   Tobacco Use  . Smoking status: Never Smoker  . Smokeless tobacco: Never Used  Substance Use Topics  . Alcohol use: Yes    Comment: one vodka tonic every evening  . Drug use: No    Home Medications Prior to Admission medications   Medication Sig Start Date End Date Taking? Authorizing Provider  acetaminophen (TYLENOL) 325 MG tablet Take 2 tablets (650 mg total) by mouth every 6 (six) hours as needed for mild pain or headache (or Fever >/= 101). 08/16/17   Loletha Grayer, MD  enoxaparin (LOVENOX) 40 MG/0.4ML injection Inject 0.4 mLs (40 mg total) into the skin daily for 14 days. 08/17/17 08/31/17  Loletha Grayer, MD  ferrous sulfate 325 (65 FE) MG tablet Take 1 tablet (325 mg total) by mouth daily with breakfast. Patient not taking: Reported on 09/07/2017 08/17/17   Loletha Grayer, MD  folic acid (FOLVITE) Q000111Q MCG tablet Take 800 mcg by mouth daily.    [provider]  Folic Acid-Vit Q000111Q 123456 (FOLBEE) 2.5-25-1 MG TABS tablet Take 1 tablet by mouth daily.    [provider]  HYDROcodone-acetaminophen (NORCO/VICODIN) 5-325 MG tablet Take 1 tablet  by mouth every 4 (four) hours as needed for moderate pain or severe pain. Patient not taking: Reported on 09/07/2017 08/16/17   Loletha Grayer, MD  metoprolol tartrate (LOPRESSOR) 25 MG tablet Take 0.5 tablets (12.5 mg total) by mouth 2 (two) times daily. Patient not taking: Reported on 09/07/2017 08/16/17   Loletha Grayer, MD  polyethylene glycol [facility] / Floria Raveling) packet Take 17 g by mouth daily. Patient not taking: Reported on 09/07/2017 08/16/17   Loletha Grayer, MD  senna (SENOKOT) 8.6 MG TABS tablet Take 1 tablet (8.6 mg  total) by mouth 2 (two) times daily. Patient not taking: Reported on 09/07/2017 08/16/17   Loletha Grayer, MD  SYNTHROID 112 MCG tablet Take 112 mcg by mouth daily before breakfast.    [provider]    Allergies    Patient has no known allergies.  Review of Systems   Review of Systems  Unable to perform ROS: Mental status change  Respiratory: Positive for cough.     Physical Exam Updated Vital Signs BP 135/67   Pulse 64   Temp 98.1 F (36.7 C) (Oral)   Resp 13   Ht 5\' 4"  (1.626 m)   Wt 53.5 kg   SpO2 97%   BMI 20.25 kg/m   Physical Exam Vitals and nursing note reviewed.   86 year old male, resting comfortably and in no acute distress. Vital signs are normal. Oxygen saturation is 97%, which is normal.  He feels warmer than measured temperature. Head is normocephalic and atraumatic. PERRLA, EOMI. Oropharynx is clear. Neck is nontender and supple without adenopathy or JVD. Back is nontender and there is no CVA tenderness. Lungs are clear without rales, wheezes, or rhonchi. Chest is nontender. Heart has regular rate and rhythm without murmur. Abdomen is soft, flat, nontender without masses or hepatosplenomegaly and peristalsis is normoactive. Extremities have no cyanosis or edema, full range of motion is present. Skin is warm and dry without rash. Neurologic: Awake and alert but not oriented to person, place, or time.  Cranial nerves are intact, there are no motor or sensory deficits.  ED Results / Procedures / Treatments   Labs (all labs ordered are listed, but only abnormal results are displayed) Labs Reviewed  LACTIC ACID, PLASMA  LACTIC ACID, PLASMA  COMPREHENSIVE METABOLIC PANEL    EKG None  Radiology No results found.  Procedures Procedures  CRITICAL CARE Performed by: Delora Fuel Total critical care time: 50 minutes Critical care time was exclusive of separately billable procedures and treating other patients. Critical care was necessary  to treat or prevent imminent or life-threatening deterioration. Critical care was time spent personally by me on the following activities: development of treatment plan with patient and/or surrogate as well as nursing, discussions with consultants, evaluation of patient's response to treatment, examination of patient, obtaining history from patient or surrogate, ordering and performing treatments and interventions, ordering and review of laboratory studies, ordering and review of radiographic studies, pulse oximetry and re-evaluation of patient's condition.  Medications Ordered in ED Medications - No data to display  ED Course  I have reviewed the triage vital signs and the nursing notes.  Pertinent labs & imaging results that were available during my care of the patient were reviewed by me and considered in my medical decision making (see chart for details).  MDM Rules/Calculators/A&P Worsening confusion.  Need to look for occult infection.  Also, will recheck CT of head to make sure he does not have a chronic subdural hematoma.  Old  records are reviewed confirming ED visit November 18 for fall with scalp laceration, and one other ED visit for a fall in 2018.  Rectal temp is 102.6.  Code sepsis was activated and he is started on empiric antibiotics for infection without obvious source.  Lactic acid has come back normal.  I have reviewed chest x-ray and CT of head and did not see any acute process in either, but official radiologist interpretation is pending.  Labs are significant for mild anemia.  Urinalysis is still pending and is a likely source of his sepsis.  He will need to be admitted.  Case is discussed with Dr. Onnie Graham of internal medicine teaching service who agrees to admit the patient..  Final Clinical Impression(s) / ED Diagnoses Final diagnoses:  Sepsis due to undetermined organism [facility])  Macrocytic anemia    Rx / DC Orders ED Discharge Orders    None       Delora Fuel,  MD [id number] [phone]    Delora Fuel, MD 0000000 [phone]

## 2019-10-14 NOTE — Progress Notes (Signed)
Pharmacy Antibiotic Note  Eric Larsen is a 86 y.o. male admitted on 10/14/2019 with sepsis.  Pharmacy has been consulted for Cefepime and Vancomycin dosing.  Height: 5\' 4"  (162.6 cm) Weight: 118 lb (53.5 kg) IBW/kg (Calculated) : 59.2  Temp (24hrs), Avg:100.4 F (38 C), Min:98.1 F (36.7 C), Max:102.6 F (39.2 C)  Recent Labs  Lab 10/14/19 0704 10/14/19 0849  WBC 7.3  --   CREATININE 1.06  --   LATICACIDVEN 1.3 1.0    Estimated Creatinine Clearance: 37.9 mL/min (by C-G formula based on SCr of 1.06 mg/dL).    No Known Allergies  Antimicrobials this admission: 2/27 Cefepime >>  2/27 Vancomycin >>   Dose adjustments this admission:   Microbiology results: Pending  Plan:  - Cefepime 2g IV q12h  - Vancomycin 1000 mg IV x 1 dose  - Followed by Vancomycin 750mg  IV q24hr  - Est Calc AUC 497 - Monitor patients renal fxn and urine output   Thank you for allowing pharmacy to be a part of this patient's care.  Duanne Limerick PharmD. BCPS 10/14/2019 11:34 AM

## 2019-10-15 ENCOUNTER — Inpatient Hospital Stay (HOSPITAL_COMMUNITY): Payer: Medicare Other

## 2019-10-15 ENCOUNTER — Inpatient Hospital Stay (HOSPITAL_COMMUNITY)
Admit: 2019-10-15 | Discharge: 2019-10-15 | Disposition: A | Discharge status: Home / Self Care | Payer: Medicare Other | Attending: Neurology | Admitting: Neurology

## 2019-10-15 DIAGNOSIS — R1310 Dysphagia, unspecified: Secondary | ICD-10-CM

## 2019-10-15 DIAGNOSIS — E785 Hyperlipidemia, unspecified: Secondary | ICD-10-CM

## 2019-10-15 DIAGNOSIS — F0390 Unspecified dementia without behavioral disturbance: Secondary | ICD-10-CM

## 2019-10-15 DIAGNOSIS — Z79899 Other long term (current) drug therapy: Secondary | ICD-10-CM

## 2019-10-15 DIAGNOSIS — D580 Hereditary spherocytosis: Secondary | ICD-10-CM

## 2019-10-15 DIAGNOSIS — R4182 Altered mental status, unspecified: Secondary | ICD-10-CM

## 2019-10-15 DIAGNOSIS — Z79891 Long term (current) use of opiate analgesic: Secondary | ICD-10-CM

## 2019-10-15 DIAGNOSIS — B004 Herpesviral encephalitis: Secondary | ICD-10-CM

## 2019-10-15 DIAGNOSIS — R410 Disorientation, unspecified: Secondary | ICD-10-CM

## 2019-10-15 DIAGNOSIS — R531 Weakness: Secondary | ICD-10-CM

## 2019-10-15 DIAGNOSIS — G9340 Encephalopathy, unspecified: Secondary | ICD-10-CM

## 2019-10-15 DIAGNOSIS — R569 Unspecified convulsions: Secondary | ICD-10-CM

## 2019-10-15 LAB — COMPREHENSIVE METABOLIC PANEL
ALT: 24 U/L (ref 0–44)
AST: 34 U/L (ref 15–41)
Albumin: 3.6 g/dL (ref 3.5–5.0)
Alkaline Phosphatase: 52 U/L (ref 38–126)
Anion gap: 12 (ref 5–15)
BUN: 22 mg/dL (ref 8–23)
CO2: 24 mmol/L (ref 22–32)
Calcium: 8.7 mg/dL — ABNORMAL LOW (ref 8.9–10.3)
Chloride: 103 mmol/L (ref 98–111)
Creatinine, Ser: 1.04 mg/dL (ref 0.61–1.24)
GFR calc Af Amer: >60 mL/min (ref >60)
GFR calc non Af Amer: >60 mL/min (ref >60)
Glucose, Bld: 95 mg/dL (ref 70–99)
Potassium: 3.4 mmol/L — ABNORMAL LOW (ref 3.5–5.1)
Sodium: 139 mmol/L (ref 135–145)
Total Bilirubin: 2.8 mg/dL — ABNORMAL HIGH (ref 0.3–1.2)
Total Protein: 6.5 g/dL (ref 6.5–8.1)

## 2019-10-15 LAB — URINE CULTURE

## 2019-10-15 LAB — VITAMIN B12: Vitamin B-12: 568 pg/mL (ref 180–914)

## 2019-10-15 LAB — CBC
HCT: 41.2 % (ref 39.0–52.0)
Hemoglobin: 13.8 g/dL (ref 13.0–17.0)
MCH: 34.1 pg — ABNORMAL HIGH (ref 26.0–34.0)
MCHC: 33.5 g/dL (ref 30.0–36.0)
MCV: 101.7 fL — ABNORMAL HIGH (ref 80.0–100.0)
Platelets: 187 10*3/uL (ref 150–400)
RBC: 4.05 MIL/uL — ABNORMAL LOW (ref 4.22–5.81)
RDW: 20.1 % — ABNORMAL HIGH (ref 11.5–15.5)
WBC: 12.4 10*3/uL — ABNORMAL HIGH (ref 4.0–10.5)
nRBC: 0 % (ref 0.0–0.2)

## 2019-10-15 LAB — GLUCOSE, CAPILLARY
Glucose-Capillary: 102 mg/dL — ABNORMAL HIGH (ref 70–99)
Glucose-Capillary: 76 mg/dL (ref 70–99)
Glucose-Capillary: 89 mg/dL (ref 70–99)
Glucose-Capillary: 90 mg/dL (ref 70–99)
Glucose-Capillary: 96 mg/dL (ref 70–99)

## 2019-10-15 LAB — TSH: TSH: 2.289 u[IU]/mL (ref 0.350–4.500)

## 2019-10-15 MED ORDER — GADOBUTROL 1 MMOL/ML IV SOLN
7.0000 mL | Freq: Once | INTRAVENOUS | Status: AC | PRN
  Administered 2019-10-15: 7 mL via INTRAVENOUS

## 2019-10-15 MED ORDER — SODIUM CHLORIDE 0.9 % IV SOLN
INTRAVENOUS | Status: DC | PRN
  Administered 2019-10-15: 250 mL via INTRAVENOUS

## 2019-10-15 MED ORDER — LEVETIRACETAM IN NACL 500 MG/100ML IV SOLN
500.0000 mg | Freq: Two times a day (BID) | INTRAVENOUS | Status: DC
  Administered 2019-10-15 – 2019-10-18 (×6): 500 mg via INTRAVENOUS
  Filled 2019-10-15 (×7): qty 100

## 2019-10-15 MED ORDER — LEVOTHYROXINE SODIUM 25 MCG PO TABS
125.0000 ug | ORAL_TABLET | Freq: Every day | ORAL | Status: DC
  Administered 2019-10-16 – 2019-10-18 (×3): 125 ug via ORAL
  Filled 2019-10-15 (×3): qty 1

## 2019-10-15 MED ORDER — DEXTROSE 5 % IV SOLN
500.0000 mg | Freq: Two times a day (BID) | INTRAVENOUS | Status: DC
  Administered 2019-10-15 – 2019-10-18 (×7): 500 mg via INTRAVENOUS
  Filled 2019-10-15 (×8): qty 10

## 2019-10-15 MED ORDER — SODIUM CHLORIDE 0.9 % IV SOLN: INTRAVENOUS | Status: AC

## 2019-10-15 MED ORDER — POTASSIUM CHLORIDE 20 MEQ PO PACK
20.0000 meq | PACK | Freq: Two times a day (BID) | ORAL | Status: AC
  Administered 2019-10-15 (×2): 20 meq via ORAL
  Filled 2019-10-15 (×2): qty 1

## 2019-10-15 MED ORDER — LEVETIRACETAM IN NACL 1000 MG/100ML IV SOLN
1000.0000 mg | INTRAVENOUS | Status: AC
  Administered 2019-10-15: 1000 mg via INTRAVENOUS
  Filled 2019-10-15: qty 100

## 2019-10-15 NOTE — Progress Notes (Signed)
New Admission Note: ? Arrival Method: Stretcher Mental Orientation: Alert to self and Disoriented to place, time and situation Telemetry: Box 20 Assessment: Completed Skin: Refer to flowsheet IV: Right and Left Antecubital Pain: 0 Tubes: none Safety Measures: Safety Fall Prevention Plan discussed with patient. Admission: Completed 5 [place] Orientation: Patient has been orientated to the room, unit and the staff. Family: None at the Bedside Orders have been reviewed and are being implemented. Will continue to monitor the patient. Call light has been placed within reach and bed alarm has been activated.  ? Milagros Loll, RN  Phone Number: [phone]

## 2019-10-15 NOTE — Evaluation (Signed)
Clinical/Bedside Swallow Evaluation Patient Details  Name: Eric Larsen MRN: [id number] Date of Birth: 04-15-1931  Today's Date: 10/15/2019 Time: SLP Start Time (ACUTE ONLY): 0910 SLP Stop Time (ACUTE ONLY): 0932 SLP Time Calculation (min) (ACUTE ONLY): 22 min  Past Medical History:  Past Medical History:  Diagnosis Date  . Hypothyroidism   . Osteoporosis   . Thyroid disease    Past Surgical History:  Past Surgical History:  Procedure Laterality Date  . HIP FRACTURE SURGERY    . INTRAMEDULLARY (IM) NAIL INTERTROCHANTERIC Right 08/13/2017   Procedure: INTRAMEDULLARY (IM) NAIL INTERTROCHANTRIC;  Surgeon: Earnestine Leys, MD;  Location: [facility] ORS;  Service: Orthopedics;  Laterality: Right;  . SHOULDER SURGERY     HPI:  [name] is an  86 y.o. yo male w/ PMH significant for hypothyroidism, hyperlipidemia, hereditary spherocytosis and recent diagnosis of dementia presenting with one week of progressively worsening confusion.  MRI brain on 2/28 reported: "Left temporal lobe is abnormal with increased signal on T2 and FLAIR and cortical thickening and medially and anteriorly. There is a small area of non masslike enhancement in the left anterior temporal lobe. The patient was febrile on admission. 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 Infiltrating tumor also in the differential."   Assessment / Plan / Recommendation Clinical Impression  Pt was seen for a bedside swallow evaluation and he presents with oral dysphagia with suspected pharyngeal and/or esophageal dysphagia.  Pt was unable to answer questions relating to his history; however, per chart review, pt's wife stated that he had been experiencing some difficulty with swallowing recently.  Pt was unable to follow commands to complete oral mechanism exam, but he appeared to have adequate dentition.  Pt consumed trials of ice chips, thin liquid (tsp/straw), puree, and regular solids.  He exhibited good bolus  acceptance with all trials.  Pt  presented with prolonged mastication of regular solids and prolonged AP transport with all trials.  A short period of bolus holding was additionally observed during puree trials, but pt responded well to verbal cues to initiate a swallow. He exhibited a delayed cough following 1/2 regular solids trials and following 1/8 trials of thin liquid via straw sip when used as a liquid wash for regular solids.  No additional overt s/sx of aspiration were observed with any trials.  Recommend initiation of Dysphagia 1 (puree) solids and thin liquids with medications administered crushed in puree.  Pt would benefit from full supervision to assist with feeding and to cue for the following compensatory strategies: 1) Small bites/sips 2) Slow rate of intake 3) Sit upright as possible 4) Minimize distractions.  SLP will f/u for diagnostic treatment and to monitor diet tolerance per POC.    SLP Visit Diagnosis: Dysphagia, unspecified (R13.10)    Aspiration Risk  Mild aspiration risk    Diet Recommendation Dysphagia 1 (Puree);Thin liquid   Liquid Administration via: Cup;Straw Medication Administration: Crushed with puree Supervision: Staff to assist with self feeding;Full supervision/cueing for compensatory strategies Compensations: Minimize environmental distractions;Slow rate;Small sips/bites Postural Changes: Seated upright at 90 degrees    Other  Recommendations Oral Care Recommendations: Oral care BID;Staff/trained caregiver to provide oral care   Follow up Recommendations Other (comment)(TBD)      Frequency and Duration min 2x/week  2 weeks       Prognosis Prognosis for Safe Diet Advancement: Fair Barriers to Reach Goals: Cognitive deficits;Language deficits      Swallow Study   General Date of Onset: 10/15/19 HPI: [name]  is an  86 y.o. yo male w/ PMH significant for hypothyroidism, hyperlipidemia, hereditary spherocytosis and recent diagnosis of  dementia presenting with one week of progressively worsening confusion.  MRI brain on 2/28 reported: "Left temporal lobe is abnormal with increased signal on T2 and FLAIR and cortical thickening and medially and anteriorly. There is a small area of non masslike enhancement in the left anterior temporal lobe. The patient was febrile on admission. 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 Infiltrating tumor also in the differential. Type of Study: Bedside Swallow Evaluation Previous Swallow Assessment: None Diet Prior to this Study: NPO Temperature Spikes Noted: Yes Respiratory Status: Room air History of Recent Intubation: No Behavior/Cognition: Alert;Cooperative;Confused;Requires cueing Oral Care Completed by SLP: No Oral Cavity - Dentition: Adequate natural dentition(Unable to fully evaluate) Patient Positioning: Upright in bed Baseline Vocal Quality: Low vocal intensity Volitional Swallow: Unable to elicit    Oral/Motor/Sensory Function Overall Oral Motor/Sensory Function: Other (comment)(Pt unable to follow commands )   Ice Chips Ice chips: Within functional limits Presentation: Spoon   Thin Liquid Thin Liquid: Impaired Presentation: Spoon;Straw Pharyngeal  Phase Impairments: Cough - Delayed    Nectar Thick Nectar Thick Liquid: Not tested   Honey Thick Honey Thick Liquid: Not tested   Puree Puree: Impaired Presentation: Spoon Oral Phase Functional Implications: Prolonged oral transit;Oral holding   Solid     Solid: Impaired Presentation: Spoon Oral Phase Impairments: Impaired mastication Oral Phase Functional Implications: Prolonged oral transit;Impaired mastication;Oral residue Pharyngeal Phase Impairments: Cough - Delayed     Colin Mulders M.S., CCC-SLP Acute Rehabilitation Services Office: [phone]  [place] 10/15/2019,9:48 AM

## 2019-10-15 NOTE — Plan of Care (Signed)
  Problem: Education: Goal: Knowledge of General Education information will improve Description: Including pain rating scale, medication(s)/side effects and non-pharmacologic comfort measures Outcome: Progressing   Problem: Safety: Goal: Ability to remain free from injury will improve Outcome: Progressing   

## 2019-10-15 NOTE — Progress Notes (Signed)
Rehab Admissions Coordinator Note:  Per PT recommendation, this patient was screened by Raechel Ache for appropriateness for an Inpatient Acute Rehab Consult.  At this time, we are recommending an Inpatient Rehab consult.  AC will contact MD to request order.   Raechel Ache 10/15/2019, 5:14 PM  I can be reached at [phone].

## 2019-10-15 NOTE — Progress Notes (Signed)
SLP Cancellation Note  Patient Details Name: Eric Larsen MRN: [id number] DOB: 20-Aug-1930   Cancelled treatment:       Reason Eval/Treat Not Completed: Patient at procedure or test/unavailable.  SLP will f/u as schedule allows.     Elvia Collum Lewis Grivas 10/15/2019, 7:58 AM

## 2019-10-15 NOTE — Progress Notes (Signed)
LTM EEG hooked up and running - no initial skin breakdown - push button tested - neuro notified.  

## 2019-10-15 NOTE — Evaluation (Signed)
Physical Therapy Evaluation Patient Details Name: Eric Larsen MRN: [id number] DOB: Apr 29, 1931 Today's Date: 10/15/2019   History of Present Illness  Eric. Eric Larsen is an  86 y.o. yo male w/ PMH significant for hypothyroidism, hyperlipidemia, hereditary spherocytosis and recent diagnosis of dementia presenting with one week of progressively worsening confusion.  MRI brain on 2/28 reported: "Left temporal lobe is abnormal with increased signal on T2 and FLAIR and cortical thickening and medially and anteriorly. There is a small area of non masslike enhancement in the left anterior temporal lobe. The patient was febrile on admission. 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 Infiltrating tumor also in the differential."   Clinical Impression   Pt admitted with above diagnosis. Comes from home, walking independently prior to this decline in function, and was going up and down a flight of stairs with his wife's help as recently as 2/26; Presents to PT with AMS, decr functional mobility, decr cognition; Given this precipitous decline in function, and he has reliable assist at home, recommend CIR for post-acute rehab to maximize independence and safety with mobility and ADLs prior to dc home;  Pt currently with functional limitations due to the deficits listed below (see PT Problem List). Pt will benefit from skilled PT to increase their independence and safety with mobility to allow discharge to the venue listed below.       Follow Up Recommendations CIR    Equipment Recommendations  Rolling walker with 5" wheels;3in1 (PT)    Recommendations for Other Services       Precautions / Restrictions Precautions Precautions: Fall      Mobility  Bed Mobility Overal bed mobility: Needs Assistance Bed Mobility: Rolling;Sidelying to Sit Rolling: Max assist Sidelying to sit: Max assist       General bed mobility comments: Max assist and use fo bed pad to cradle hips for  roll; Max assist to elevate trunk to sitting  Transfers Overall transfer level: Needs assistance Equipment used: 2 person hand held assist Transfers: Sit to/from Stand Sit to Stand: Max assist;+2 physical assistance;+2 safety/equipment         General transfer comment: Max assist to steady with rise to stand; Eric Larsen noted to have good knee and hip extension to stand, however significant posterior lean, requiring Max assist to  shift weight anteriorly over feet  Ambulation/Gait     Assistive device: 2 person hand held assist       General Gait Details: Attempted to march in place at EOB with +2 assist; multimodal cueing for weight shifts into stance R and L; able to lift R foot, unable to clear floor with L foot  Stairs            Wheelchair Mobility    Modified Rankin (Stroke Patients Only)       Balance Overall balance assessment: Needs assistance Sitting-balance support: Feet supported Sitting balance-[name]: Poor       Standing balance-[name]: Zero                               Pertinent Vitals/Pain Pain Assessment: No/denies pain Faces Pain Scale: No hurt    Home Living Family/patient expects to be discharged to:: Private residence Living Arrangements: Spouse/significant other Available Help at Discharge: Family;Available 24 hours/day Type of Home: House Home Access: Stairs to enter Entrance Stairs-Rails: Right;Left;Can reach both Entrance Stairs-Number of Steps: 4 Home Layout: Two level;Able to live on main level  with bedroom/bathroom Home Equipment: Shower seat;Grab bars - tub/shower;Walker - 4 wheels      Prior Function Level of Independence: Needs assistance   Gait / Transfers Assistance Needed: Walks independently; assist needed recently to go up and down the flight of stairs to reach bedroom     Comments: Had gone up and down the flight of steps in his home as recently as 2/26     Hand Dominance         Extremity/Trunk Assessment   Upper Extremity Assessment Upper Extremity Assessment: Defer to OT evaluation    Lower Extremity Assessment Lower Extremity Assessment: Generalized weakness;Difficult to assess due to impaired cognition    Cervical / Trunk Assessment Cervical / Trunk Assessment: Kyphotic  Communication   Communication: HOH  Cognition Arousal/Alertness: Lethargic(But more awake once sitting EOB) Behavior During Therapy: WFL for tasks assessed/performed Overall Cognitive Status: Impaired/Different from baseline Area of Impairment: Orientation;Attention;Following commands;Safety/judgement;Awareness;Problem solving                 Orientation Level: Disoriented to;Place;Time;Situation     Following Commands: Follows one step commands with increased time     Problem Solving: Slow processing;Decreased initiation;Difficulty sequencing;Requires verbal cues;Requires tactile cues General Comments: Made attempts at answering questions, however verbalizations were largely unintelligible      General Comments      Exercises     Assessment/Plan    PT Assessment Patient needs continued PT services  PT Problem List Decreased strength;Decreased range of motion;Decreased activity tolerance;Decreased balance;Decreased mobility;Decreased coordination;Decreased cognition;Decreased knowledge of use of DME;Decreased safety awareness;Decreased knowledge of precautions       PT Treatment Interventions DME instruction;Gait training;Functional mobility training;Therapeutic activities;Therapeutic exercise;Balance training;Neuromuscular re-education;Cognitive remediation;Patient/family education    PT Goals (Current goals can be found in the Care Plan section)  Acute Rehab PT Goals Patient Stated Goal: Unable to state PT Goal Formulation: With family Time For Goal Achievement: 10/29/19 Potential to Achieve Goals: Good    Frequency Min 3X/week   Barriers to discharge         Co-evaluation               AM-PAC PT "6 Clicks" Mobility  Outcome Measure Help needed turning from your back to your side while in a flat bed without using bedrails?: A Lot Help needed moving from lying on your back to sitting on the side of a flat bed without using bedrails?: A Lot Help needed moving to and from a bed to a chair (including a wheelchair)?: A Lot Help needed standing up from a chair using your arms (e.g., wheelchair or bedside chair)?: A Lot Help needed to walk in hospital room?: Total Help needed climbing 3-5 steps with a railing? : Total 6 Click Score: 10    End of Session   Activity Tolerance: Patient tolerated treatment well Patient left: in bed;with call bell/phone within reach;with family/visitor present Nurse Communication: Mobility status PT Visit Diagnosis: Unsteadiness on feet (R26.81);Other abnormalities of gait and mobility (R26.89);Other symptoms and signs involving the nervous system (R29.898)    Time: [id number] PT Time Calculation (min) (ACUTE ONLY): 31 min   Charges:   PT Evaluation $PT Eval Moderate Complexity: 1 Mod PT Treatments $Therapeutic Activity: 8-22 mins        Roney Marion, PT  Acute Rehabilitation Services Pager [phone] Office [phone]   [place] 10/15/2019, 4:37 PM

## 2019-10-15 NOTE — Progress Notes (Signed)
Subjective: HD#1 Overnight, no acute events reported.  This morning, patient evaluated at bedside. He appears to be comfortable. He is awake and alert but does not appropriately respond to questions. His responses mostly consist of "yes" and otherwise unintelligible phrases.   Objective:  Vital signs in last 24 hours: Vitals:   10/14/19 1937 10/14/19 2030 10/15/19 0444 10/15/19 1130  BP:  120/74 121/66 127/70  Pulse:  65 67 70  Resp:  16 17 18   Temp: 99.1 F (37.3 C) 99.3 F (37.4 C) 98.4 F (36.9 C) 99.2 F (37.3 C)  TempSrc:  Oral  Oral  SpO2:  94% 94% 95%  Weight:      Height:       CBC Latest Ref Rng & Units 10/15/2019 10/14/2019 08/16/2017  WBC 4.0 - 10.5 K/uL 12.4(H) 7.3 11.9(H)  Hemoglobin 13.0 - 17.0 g/dL 13.8 12.3(L) 9.3(L)  Hematocrit 39.0 - 52.0 % 41.2 37.1(L) 25.4(L)  Platelets 150 - 400 K/uL 187 206 171   CMP Latest Ref Rng & Units 10/15/2019 10/14/2019 08/15/2017  Glucose 70 - 99 mg/dL 95 105(H) 150(H)  BUN 8 - 23 mg/dL 22 23 22(H)  Creatinine 0.61 - 1.24 mg/dL 1.04 1.06 1.06  Sodium 135 - 145 mmol/L 139 136 135  Potassium 3.5 - 5.1 mmol/L 3.4(L) 3.9 4.0  Chloride 98 - 111 mmol/L 103 101 102  CO2 22 - 32 mmol/L 24 26 27   Calcium 8.9 - 10.3 mg/dL 8.7(L) 8.7(L) 8.3(L)  Total Protein 6.5 - 8.1 g/dL 6.5 6.2(L) -  Total Bilirubin 0.3 - 1.2 mg/dL 2.8(H) 2.2(H) -  Alkaline Phos 38 - 126 U/L 52 51 -  AST 15 - 41 U/L 34 62(H) -  ALT 0 - 44 U/L 24 22 -   Physical Exam  Constitutional: He is well-developed, well-nourished, and in no distress. No distress.  HENT:  Head: Normocephalic and atraumatic.  Eyes: Conjunctivae and EOM are normal. No scleral icterus.  Cardiovascular: Normal rate, regular rhythm, normal heart sounds and intact distal pulses.  No murmur heard. Pulmonary/Chest: Effort normal and breath sounds normal. No respiratory distress. He has no wheezes. He has no rales.  Abdominal: Soft. Bowel sounds are normal. He exhibits no distension. There is no  abdominal tenderness.  Musculoskeletal:        General: No tenderness or edema. Normal range of motion.     Cervical back: Normal range of motion and neck supple.  Neurological: He is alert.  Skin: Skin is warm and dry. He is not diaphoretic.     Assessment/Plan: [name] is an 86yr old community dwelling male with PMHx of hypothyroidism and hereditary spherocytosis presenting with one week of worsening confusion, generalized weakness, urinary incontinence and worsening cough. Noted to have fever on presentation and MRI suggestive of herpes encephalitis.   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 Noted to have Tmax of 102.70F on presentation. Infectious work up including CXR, UA, blood cultures and urine cultures negative thusfar. CT head negative; however, due to concerns of stroke, MRI brain obtained which showed left anterior temporal lobe enhancement suggestive of herpes encephalitis vs infiltrating tumor. EEG obtained which was significant for sharp waves at left anterior temporal lobe. Patient has received one dose of acyclovir. - Neurology following, appreciate their recommendations - LP tomorrow - IV Acyclovir 500mg  q12h - IV Keppra 500mg  q12h - Overnight EEG with video - PT/OT evaluation  Dysphagia: Patient noted to have progressive dysphagia on presentation. Patient evaluated by SLP today and recommended for dysphagia 1 diet.  - Continue SLP evaluation and monitoring of diet tolerance - Dysphagia 1 diet   Dementia:  Patient recently diagnosed with dementia with several episodes of sundowning at home. He was started on donepezil 5mg  daily by PCP on 2/23. Suspect patient's symptoms may be secondary to encephalitis.  - Holding donepezil for now.   Hypothyroidism:  Patient is on levothyroxine 183mcg at home.  - Continue levothyroxine  174mcg daily  FEN/GI: Diet: Dysphagia 1 (puree), thin liquid Fluids: NS 75 cc/hr Electrolytes: Monitor and replete prn  VTE Prophylaxis: SCD's  Code status: FULL  Prior to Admission Living Arrangement: Home  Anticipated Discharge Location: Pending PT/OT eval and recommendations  Barriers to Discharge: Continued medical management Dispo: Anticipated discharge pending clinical improvement.   Harvie Heck, MD  Internal Medicine, PGY-1  Pager: [phone] 10/15/2019, 12:51 PM

## 2019-10-15 NOTE — Progress Notes (Signed)
EEG complete - results pending 

## 2019-10-15 NOTE — Progress Notes (Addendum)
Same day note  -EEG consistent with left-sided sharps. -MRI did not show florid enhancement of the lesion that was seen on the MRI with increased signal on T2 and FLAIR images.  There is a small area of non-mass-like enhancement in the left anterior temporal lobe.  More favoring HSV encephalitis.  Cannot rule out tumor.  Recommendations: -Hold enoxaparin in anticipation for the LP -LP tomorrow-at least after 24 hours from the last enoxaparin dose. -Empiric coverage with acyclovir until HSV comes back negative. -Loaded with Keppra.  Continue Keppra for now. -Continue LTM EEG  Neurology will follow with you  -- Amie Portland, MD [facility] Pager: [phone] If 7pm to 7am, please call on call as listed on AMION.

## 2019-10-15 NOTE — Progress Notes (Signed)
  Date: 10/15/2019  Patient name: Eric Larsen  Medical record number: [id number]  Date of birth: 05-Nov-1930   I have seen and evaluated Tyler Aas and discussed their care with the Residency Team.  In brief, patient is an 86 year old male with a past medical history of hypothyroidism, hyperlipidemia, hereditary spherocytosis and recent diagnosis of dementia who presented to the ED with 1 week history of progressively worsening confusion.  History obtained from chart as patient is unable to provide history at this time.  Per chart, patient has had a noticeable mental decline since 09/13/2022 when his dog passed away.  However, his confusion worsened over the last week and he is also had worsening generalized weakness.  Patient initially showed some mild improvement after the symptoms started but then began to deteriorate and became increasingly confused with sundowning.  Patient was brought to the ED for further evaluation.  In the ED, patient was found to be febrile up to 102.6 F.  Unable to obtain review of systems at this time as patient remains confused.  PMHx, Fam Hx, and/or Soc Hx : As per resident admit note  Vitals:   10/15/19 0444 10/15/19 1130  BP: 121/66 127/70  Pulse: 67 70  Resp: 17 18  Temp: 98.4 F (36.9 C) 99.2 F (37.3 C)  SpO2: 94% 95%   General: Awake, alert, confused CVS: Regular rate and rhythm, normal heart sounds Lungs: CTA bilaterally Abdomen: Soft, nontender, nondistended, normoactive bowel sounds Extremities: No edema noted, nontender to palpation HEENT: Normocephalic, atraumatic Skin: Warm and dry Neuro: Awake, confused, does not follow commands, repeats yes to almost all questions  Assessment and Plan: I have seen and evaluated the patient as outlined above. I agree with the formulated Assessment and Plan as detailed in the residents' note, with the following changes:   1.  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  Infectious work-up including chest x-ray, UA, blood cultures have been negative so far.  However, patient was noted to have an area of nonmasslike enhancement in his left anterior temporal lobe likely secondary to HSV encephalitis. -Neuro follow-up and recommendations appreciated -EEG showing left-sided sharps -We will hold Lovenox in anticipation for LP (will be done tomorrow) -Continue with IV acyclovir given likely HSV encephalitis -Patient was loaded with Keppra.  We will continue with Keppra for now -Blood cultures with no growth to date.  We will continue to monitor -We will follow PT/OT evaluation -We will continue with IV fluids as patient is on acyclovir and will require hydration to prevent acyclovir induced renal toxicity -SLP follow-up and recommendations appreciated.  We will start the patient on dysphagia 1 diet -Continue with IV Synthroid for now -No further work-up at this time.  We will continue to monitor closely.  Aldine Contes, MD 2/28/202112:13 PM

## 2019-10-15 NOTE — Consult Note (Signed)
NEURO HOSPITALIST CONSULT NOTE   Requesting physician: Dr. Dareen Piano  Reason for Consult: Altered mental status  History obtained from:  Chart   HPI:                                                                                                                                          Eric Larsen is an 86 y.o. male who presented to the ED on Saturday with increasing confusion x 1 week in the setting of a recent diagnosis of dementia. His symptoms became noticeable on Sunday when generalized weakness and confusion were noted, followed by some improvement on Monday. After adjustment of his thyroid medication on Tuesday, he was back to his baseline until Thursday, when he became increasingly confused and experienced sundowning in the evening. There was some improvement on Friday, but then had increasing confusion again on Saturday. He has had an unsteady gait, switching from his usual cane to a rolling walker. He also has had new onset of urinary incontinence for the past 6 days with foul smelling urine. He also had developed a cough recently and wife thought that he may have run a low-grade fever just prior to presenting. His appetite has been normal and there has been no vomiting or diarrhea. His second dose of Covid vaccine was on February 15. His rectal temperature was 102.6 in the ED. WBC was normal. He was started on empiric antibiotics.   CT head showed no acute abnormality. Widening of the ventricles and sulci was noted, consistent with atrophy.   MRI was then obtained, revealing a left anterior and medial temporal lobe region of thickened and T2-hyperintense gyri with edema of the underlying white matter. Neurology was called to further evaluate.   Past Medical History:  Diagnosis Date  . Hypothyroidism   . Osteoporosis   . Thyroid disease     Past Surgical History:  Procedure Laterality Date  . HIP FRACTURE SURGERY    . INTRAMEDULLARY (IM) NAIL  INTERTROCHANTERIC Right 08/13/2017   Procedure: INTRAMEDULLARY (IM) NAIL INTERTROCHANTRIC;  Surgeon: Earnestine Leys, MD;  Location: [facility] ORS;  Service: Orthopedics;  Laterality: Right;  . SHOULDER SURGERY      Family History  Problem Relation Age of Onset  . Hereditary spherocytosis Father   . Hereditary spherocytosis Sister               Social History:  reports that he has never smoked. He has never used smokeless tobacco. He reports current alcohol use. He reports that he does not use drugs.  No Known Allergies  MEDICATIONS:  Prior to Admission:  Medications Prior to Admission  Medication Sig Dispense Refill Last Dose  . acetaminophen (TYLENOL) 325 MG tablet Take 2 tablets (650 mg total) by mouth every 6 (six) hours as needed for mild pain or headache (or Fever >/= 101).   unknown at unknown  . calcium-vitamin D (OSCAL WITH D) 500-200 MG-UNIT TABS tablet Take 1 tablet by mouth 2 (two) times daily with a meal.   10/13/2019 at Unknown time  . Cholecalciferol 25 MCG (1000 UT) CHEW Chew 2,000 Units by mouth daily.   10/13/2019 at Unknown time  . donepezil (ARICEPT) 5 MG tablet Take 1 tablet by mouth at bedtime.   10/13/2019 at Unknown time  . folic acid (FOLVITE) [id number] MCG tablet Take 400 mcg by mouth daily.   10/13/2019 at Unknown time  . folic acid (FOLVITE) [id number] MCG tablet Take 800 mcg by mouth daily.   10/13/2019 at Unknown time  . Multiple Vitamin (MULTIVITAMIN) capsule Take 1 capsule by mouth daily.   10/13/2019 at Unknown time  . Multiple Vitamins-Minerals (PRESERVISION AREDS PO) Take 1 tablet by mouth every evening.   10/13/2019 at Unknown time  . Niacin (VITAMIN B-3 PO) Take 1 tablet by mouth daily.   10/13/2019 at Unknown time  . SYNTHROID 125 MCG tablet Take 125 mcg by mouth daily.   10/13/2019 at Unknown time  . enoxaparin (LOVENOX) 40 MG/0.4ML injection Inject 0.4 mLs (40 mg  total) into the skin daily for 14 days. (Patient not taking: Reported on 10/14/2019) 14 Syringe 0 Not Taking at Unknown time  . ferrous sulfate 325 (65 FE) MG tablet Take 1 tablet (325 mg total) by mouth daily with breakfast. (Patient not taking: Reported on 09/07/2017) 30 tablet 0 Not Taking at Unknown time  . HYDROcodone-acetaminophen (NORCO/VICODIN) 5-325 MG tablet Take 1 tablet by mouth every 4 (four) hours as needed for moderate pain or severe pain. (Patient not taking: Reported on 09/07/2017) 30 tablet 0 Not Taking at Unknown time  . metoprolol tartrate (LOPRESSOR) 25 MG tablet Take 0.5 tablets (12.5 mg total) by mouth 2 (two) times daily. (Patient not taking: Reported on 09/07/2017) 15 tablet 0 Not Taking at Unknown time  . polyethylene glycol (MIRALAX / GLYCOLAX) packet Take 17 g by mouth daily. (Patient not taking: Reported on 09/07/2017) 30 each 0 Not Taking at Unknown time  . senna (SENOKOT) 8.6 MG TABS tablet Take 1 tablet (8.6 mg total) by mouth 2 (two) times daily. (Patient not taking: Reported on 09/07/2017) 60 each 0 Not Taking at Unknown time   Scheduled: . enoxaparin (LOVENOX) injection  40 mg Subcutaneous Q24H  . levothyroxine  62.5 mcg Intravenous Daily   Continuous: . sodium chloride 250 mL (10/15/19 0616)  . acyclovir 500 mg (10/15/19 0617)     ROS:  As per HPI. Unable to obtain additional ROS due to AMS.    Blood pressure 121/66, pulse 67, temperature 98.4 F (36.9 C), resp. rate 17, height 5\' 4"  (1.626 m), weight 53.5 kg, SpO2 94 %.   General Examination:                                                                                                       Physical Exam  HEENT-  Normocephalic. Skin to forehead is warm to touch.  Lungs- Respirations unlabored  Extremities- No edema  Neurological Examination Mental Status: Awake and  alert. Will attend to examiner. Vocalizations are hypophonic with stereotyped, dysarthric phrases to some questions (e.g. "I'm OK", "yes sir") that have no relationship to the questions asked. Most of his verbalizations consist of short, unintelligible phrase-like utterances. Does not follow any commands, appearing somewhat confused after instructions are given.   Cranial Nerves: II: Blinks to threat in left and right temporal visual fields. Will track examiner to left and right. Pupils equal at 2 mm.  III,IV, VI: No ptosis. Will track horizontally to left and right, as well as vertically. No nystagmus.  V,VII: Face is symmetric. Reacts to eyebrow stimulation bilaterally.  VIII: Hearing intact to voice IX,X: Hypophonic speech XI: Head is midline.  XII: Does not protrude tongue to command Motor/Sensory: When upper extremities are passively lifted, they remain in position for a few seconds with no asymmetry as they drift back down to his waist. He will grip and flex at biceps with 4+/5 strength bilaterally. Deltoid and triceps 4+/5 bilaterally on limited testing due to receptive aphasia.   Withdraws BLE with 4/5 strength to noxious plantar stimulation bilaterally.  Subtle cogwheel rigidity of BUE with passive motion.  Deep Tendon Reflexes: 2+ bilateral biceps, brachioradialis and patellae. 0 achilles bilaterally. Toes downgoing bilaterally.  Cerebellar: Unable to follow commands for FNF, but with motor testing there is no gross ataxia bilaterally.  Gait: Deferred  Lab Results: Basic Metabolic Panel: Recent Labs  Lab 10/14/19 0704  NA 136  K 3.9  CL 101  CO2 26  GLUCOSE 105*  BUN 23  CREATININE 1.06  CALCIUM 8.7*    CBC: Recent Labs  Lab 10/14/19 0704  WBC 7.3  NEUTROABS 5.0  HGB 12.3*  HCT 37.1*  MCV 103.3*  PLT 206    Cardiac Enzymes: No results for input(s): CKTOTAL, CKMB, CKMBINDEX, TROPONINI in the last 168 hours.  Lipid Panel: No results for input(s): CHOL, TRIG,  HDL, CHOLHDL, VLDL, LDLCALC in the last 168 hours.  Imaging: DG Abd 1 View  Result Date: 10/15/2019 CLINICAL DATA:  Altered level of consciousness, MRI clearance EXAM: ABDOMEN - 1 VIEW COMPARISON:  None. FINDINGS: Supine frontal views of the abdomen and pelvis are obtained. Portions of the left hemidiaphragm are excluded by collimation. Cholecystectomy clips overlie right upper quadrant. Postsurgical changes from left hip arthroplasty and right hip ORIF. No other metallic foreign bodies. Bowel gas pattern is unremarkable. Lung bases are clear. IMPRESSION: 1. Postsurgical changes as above. 2. Unremarkable bowel gas pattern. Electronically Signed   By:  Randa Ngo M.D.   On: 10/15/2019 02:30   CT Head Wo Contrast  Result Date: 10/14/2019 CLINICAL DATA:  Altered mental status. EXAM: CT HEAD WITHOUT CONTRAST TECHNIQUE: Contiguous axial images were obtained from the base of the skull through the vertex without intravenous contrast. COMPARISON:  07/05/2019 FINDINGS: Brain: There is no evidence for acute hemorrhage, hydrocephalus, mass lesion, or abnormal extra-axial fluid collection. No definite CT evidence for acute infarction. Diffuse loss of parenchymal volume is consistent with atrophy. Patchy low attenuation in the deep hemispheric and periventricular white matter is nonspecific, but likely reflects chronic microvascular ischemic demyelination. Vascular: No hyperdense vessel or unexpected calcification. Skull: No evidence for fracture. No worrisome lytic or sclerotic lesion. Sinuses/Orbits: Tiny polypoid focus of mucosal disease noted left maxillary sinus, stable. The visualized paranasal sinuses and mastoid air cells are otherwise clear. Visualized portions of the globes and intraorbital fat are unremarkable. Other: 9. IMPRESSION: 1. No acute intracranial abnormality. 2. Atrophy with chronic small vessel white matter ischemic disease. Electronically Signed   By: Misty Stanley M.D.   On: 10/14/2019 07:47    MR BRAIN WO CONTRAST  Result Date: 10/15/2019 CLINICAL DATA:  New diagnosis of dementia which is worsening recently. EXAM: MRI HEAD WITHOUT CONTRAST TECHNIQUE: Multiplanar, multiecho pulse sequences of the brain and surrounding structures were obtained without intravenous contrast. COMPARISON:  Head CT yesterday. FINDINGS: Brain: Diffusion imaging does not show any acute or subacute infarction. No focal abnormality affects the brainstem or cerebellum. Cerebral hemispheres show pronounced atrophy without lobar predominance. There are chronic small-vessel ischemic changes of the deep white matter. Ventricles appear in proportion to the sulci. There is confluent abnormal signal with sulcal thickening in the anterior and medial left temporal lobe. The appearance is worrisome for development of a primary brain neoplasm. Acute inflammation is felt less likely given this appearance, which does not show vasogenic edema. None the less, postcontrast imaging would be recommended to assess for enhancement within the lesion. Vascular: Major vessels at the base of the brain show flow. Skull and upper cervical spine: Negative Sinuses/Orbits: Clear/normal Other: None IMPRESSION: Generalized atrophy with chronic small-vessel ischemic changes of the white matter. Focal abnormality of the left anterior and medial temporal lobe with thickening and increased T2 signal. 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 Postcontrast imaging is recommended for further evaluation. Whereas the possibility of cerebritis does exist (herpes encephalitis), the pattern is much more suggestive of neoplastic infiltration. Electronically Signed   By: Nelson Chimes M.D.   On: 10/15/2019 04:35   DG Chest Port 1 View  Result Date: 10/14/2019 CLINICAL DATA:  Mental status change EXAM: PORTABLE CHEST 1 VIEW COMPARISON:  08/12/2017 FINDINGS: The heart size and mediastinal contours are within normal  limits. Both lungs are clear. The visualized skeletal structures are unremarkable. IMPRESSION: No active disease. Electronically Signed   By: Franchot Gallo M.D.   On: 10/14/2019 07:24     Assessment: 86 year old male with progressive confusion 1. Exam reveals receptive and expressive aphasia and mild cogwheel rigidity. No asymmetry noted on CN or motor exams.  2. MRI brain reveals focal abnormality of the left anterior and medial temporal lobe with cortical thickening and increased T2 signal in both the cortical grey matter and adjacent white matter. 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 Whereas the possibility of cerebritis does exist (herpes encephalitis), the pattern is much more suggestive of neoplastic infiltration, per Radiology.  3.  Based on review of the MRI by Neurology, the DDx also includes other viral encephalitides and paraneoplastic/autoimmune encephalitis. Edema from possible subclinical seizure activity is also possible, but usually this presents with hyperintense DWI signal being more prominent than FLAIR or T2-weighted hyperintensity - the opposite is the case for the signal characteristic on this patient's MRI (the temporal lobe lesion is bright on T2, with only minimally increased signal intensity on DWI). The lesion could also represent an ischemic infarction, but the location would be atypical.   Recommendations: 1. MRI brain add-on study with post-contrast images (ordered).  2. EEG (ordered) 3. Start empiric IV acyclovir for possible herpes encephalitis.  4. Pending results of post-contrast MRI and EEG, he may need an LP for cell count with differential, protein, glucose, HSV PCR and IgG index. If LP is performed, Lovenox will need to be held to reduce risk for intrathecal bleeding during the procedure (if Lovenox is held, start SCDs for DVT prophylaxis).    Electronically signed: Dr. Kerney Elbe 10/15/2019, 5:05  AM

## 2019-10-15 NOTE — Progress Notes (Addendum)
   NAME:  Eric Larsen, MRN:  [id number], DOB:  [birth year], LOS: 2 ADMISSION DATE:  10/14/2019   Subjective/Interm history  No overnight events VSS  States he is feeling well today. Thinks it's 2022. He denies pain.   Objective   Blood pressure 123/66, pulse (!) 57, temperature 98.5 F (36.9 C), temperature source Oral, resp. rate 18, height 5\' 4"  (1.626 m), weight 53.5 kg, SpO2 96 %.     Intake/Output Summary (Last 24 hours) at 10/16/2019 1033 Last data filed at 10/16/2019 0900 Gross per 24 hour  Intake 764.5 ml  Output 400 ml  Net 364.5 ml   Filed Weights   10/14/19 0615  Weight: 53.5 kg    Examination: GENERAL: in no acute distress HEENT: airway patent CARDIAC: heart RRR. No peripheral edema.  PULMONARY: acyanotic. Lung sounds clear to auscultation. ABDOMEN: soft. Nontender to palpation.  Nondistended.  NEURO: alert. Oriented only to person. Word salad verbalization. No other apparent deficits. Mental status barring further exam SKIN: no rash or lesions on limited exam   Consults:  Neuro  Significant Diagnostic Tests:   2/28 MRI brain>>thickening and increased T2 signal in the left anterior and medial temporal lobe.. findings most consistent with herpes encephalitis in the setting of febrile illness. Can not rule out infiltrating tumor.  EEG: epileptogenicity and cortical dysfunction in the left anterior temporal region.  Micro Data:  CSF culture>>  Antimicrobials:  Acyclovir 2/28>>  Labs    CBC Latest Ref Rng & Units 10/16/2019 10/15/2019 10/14/2019  WBC 4.0 - 10.5 K/uL 9.9 12.4(H) 7.3  Hemoglobin 13.0 - 17.0 g/dL 12.4(L) 13.8 12.3(L)  Hematocrit 39.0 - 52.0 % 37.3(L) 41.2 37.1(L)  Platelets 150 - 400 K/uL 201 187 206   BMP Latest Ref Rng & Units 10/16/2019 10/15/2019 10/14/2019  Glucose 70 - 99 mg/dL 107(H) 95 105(H)  BUN 8 - 23 mg/dL 23 22 23   Creatinine 0.61 - 1.24 mg/dL 0.95 1.04 1.06  Sodium 135 - 145 mmol/L 140 139 136  Potassium 3.5 - 5.1 mmol/L  3.3(L) 3.4(L) 3.9  Chloride 98 - 111 mmol/L 105 103 101  CO2 22 - 32 mmol/L 26 24 26   Calcium 8.9 - 10.3 mg/dL 8.5(L) 8.7(L) 8.7(L)    Summary  86 yo male with PMH of hypothyroidism and hereditary spherocytosis who is presenting with 1w hx of progressive confusion, weakness, urinary incontinence and cough.  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   EEG with epileptiform patterns in the temporal lobe Appreciate neurology's recommendations Continues to have impaired mental status Plan LP with culture Continue acyclovir until hsv returns Continue keppra LTM pending  Dementia. Continue to hold donepezil until mental status improves.    Best practice:  CODE STATUS: FULL Diet: dysphagia 1 DVT for prophylaxis: SCDs Dispo: pending further working   Mitzi Hansen, [place] PGY-1 PAGER #: [phone] 10/16/19  10:33 AM

## 2019-10-15 NOTE — Procedures (Addendum)
Patient Name: Eric Larsen  MRN: [id number]  Epilepsy Attending: Lora Havens  Referring Physician/Provider: Dr Kerney Elbe Date: 10/15/2019 Duration: 25.02 mins  Patient history: 86 year old male with progressive confusion. MRI showed left temporal lobe is abnormal with increased signal on T2 and FLAIR and cortical thickening and medially and anteriorly. There is a small area of non masslike enhancement in the left anterior temporal lobe. EEG to evaluate for seizure.   Level of alertness: Lethargic, asleep  AEDs during EEG study: LEV  Technical aspects: This EEG study was done with scalp electrodes positioned according to the 10-20 International system of electrode placement. Electrical activity was acquired at a sampling rate of 500Hz  and reviewed with a high frequency filter of 70Hz  and a low frequency filter of 1Hz . EEG data were recorded continuously and digitally stored.   DESCRIPTION: No clear posterior dominant rhythm was seen. Sleep was characterized by vertex waves, maximal frontocentral. EEG showed continuous generalized, maximal left temporal region, 3-6hz  theta-delta slowing. Frequent sharp waves were seen in left anterior temporal ( F7/T7) region. Hyperventilation and photic stimulation were not performed.  ABNORMALITY - Sharp waves, left anterior temporal, maximal F7/T7 - Continuous slow, generalized and maximal left anterior temporal  IMPRESSION: This study showed epileptogenicity and cortical dysfunction in left anterior temporal region. Additionally, there is evidence of mild to moderate diffuse encephalopathy, non specific to etiology. No seizures were seen throughout the recording.  Dr Rory Percy was immediately notified.     Stoney Karczewski Barbra Sarks

## 2019-10-15 NOTE — Progress Notes (Signed)
Pharmacy Antibiotic Note  Eric Larsen is a 86 y.o. male admitted on 10/14/2019 with altered mental status.  Pharmacy has been consulted for Acyclovir dosing for r/o HSV encephalitis. WBC WNL. CrCl ~40.   Plan: Acyclovir 10 mg/kg IV q12h Trend WBC, temp, renal function  F/U infectious work-up  Height: 5\' 4"  (162.6 cm) Weight: 118 lb (53.5 kg) IBW/kg (Calculated) : 59.2  Temp (24hrs), Avg:99.3 F (37.4 C), Min:98.1 F (36.7 C), Max:102.6 F (39.2 C)  Recent Labs  Lab 10/14/19 0704 10/14/19 0849  WBC 7.3  --   CREATININE 1.06  --   LATICACIDVEN 1.3 1.0    Estimated Creatinine Clearance: 37.9 mL/min (by C-G formula based on SCr of 1.06 mg/dL).    No Known Allergies  Narda Bonds, PharmD, BCPS Clinical Pharmacist Phone: [phone]

## 2019-10-16 DIAGNOSIS — B004 Herpesviral encephalitis: Principal | ICD-10-CM

## 2019-10-16 LAB — CSF CELL COUNT WITH DIFFERENTIAL
Eosinophils, CSF: 0 % (ref 0–1)
Lymphs, CSF: 87 % — ABNORMAL HIGH (ref 40–80)
Monocyte-Macrophage-Spinal Fluid: 13 % — ABNORMAL LOW (ref 15–45)
RBC Count, CSF: 6 /mm3 — ABNORMAL HIGH
Segmented Neutrophils-CSF: 0 % (ref 0–6)
Tube #: 3
WBC, CSF: 84 /mm3 (ref 0–5)

## 2019-10-16 LAB — CBC
HCT: 37.3 % — ABNORMAL LOW (ref 39.0–52.0)
Hemoglobin: 12.4 g/dL — ABNORMAL LOW (ref 13.0–17.0)
MCH: 34.1 pg — ABNORMAL HIGH (ref 26.0–34.0)
MCHC: 33.2 g/dL (ref 30.0–36.0)
MCV: 102.5 fL — ABNORMAL HIGH (ref 80.0–100.0)
Platelets: 201 10*3/uL (ref 150–400)
RBC: 3.64 MIL/uL — ABNORMAL LOW (ref 4.22–5.81)
RDW: 19.9 % — ABNORMAL HIGH (ref 11.5–15.5)
WBC: 9.9 10*3/uL (ref 4.0–10.5)
nRBC: 0 % (ref 0.0–0.2)

## 2019-10-16 LAB — BASIC METABOLIC PANEL
Anion gap: 9 (ref 5–15)
BUN: 23 mg/dL (ref 8–23)
CO2: 26 mmol/L (ref 22–32)
Calcium: 8.5 mg/dL — ABNORMAL LOW (ref 8.9–10.3)
Chloride: 105 mmol/L (ref 98–111)
Creatinine, Ser: 0.95 mg/dL (ref 0.61–1.24)
GFR calc Af Amer: >60 mL/min (ref >60)
GFR calc non Af Amer: >60 mL/min (ref >60)
Glucose, Bld: 107 mg/dL — ABNORMAL HIGH (ref 70–99)
Potassium: 3.3 mmol/L — ABNORMAL LOW (ref 3.5–5.1)
Sodium: 140 mmol/L (ref 135–145)

## 2019-10-16 LAB — PROTIME-INR
INR: 1.2 (ref 0.8–1.2)
Prothrombin Time: 14.8 seconds (ref 11.4–15.2)

## 2019-10-16 LAB — CULTURE, BLOOD (ROUTINE X 2)

## 2019-10-16 LAB — PROTEIN AND GLUCOSE, CSF
Glucose, CSF: 56 mg/dL (ref 40–70)
Total  Protein, CSF: 139 mg/dL — ABNORMAL HIGH (ref 15–45)

## 2019-10-16 LAB — APTT: aPTT: 34 seconds (ref 24–36)

## 2019-10-16 MED ORDER — POTASSIUM CHLORIDE CRYS ER 20 MEQ PO TBCR
20.0000 meq | EXTENDED_RELEASE_TABLET | Freq: Three times a day (TID) | ORAL | Status: AC
  Administered 2019-10-16 (×3): 20 meq via ORAL
  Filled 2019-10-16 (×3): qty 1

## 2019-10-16 MED ORDER — LIDOCAINE HCL 1 % IJ SOLN
10.0000 mL | Freq: Once | INTRAMUSCULAR | Status: DC
  Filled 2019-10-16: qty 10

## 2019-10-16 NOTE — Procedures (Addendum)
Patient Name: Eric Larsen  MRN: [id number]  Epilepsy Attending: Lora Havens  Referring Physician/Provider: Dr Amie [place] Duration: 10/15/2019 1257 to 2/29/2021 1116  Patient history: 86 year old male with progressive confusion. MRI showed left temporal lobe is abnormal with increased signal on T2 and FLAIR and cortical thickening and medially and anteriorly. There is a small area of non masslike enhancement in the left anterior temporal lobe. EEG to evaluate for seizure.   Level of alertness: Lethargic, asleep  AEDs during EEG study: LEV  Technical aspects: This EEG study was done with scalp electrodes positioned according to the 10-20 International system of electrode placement. Electrical activity was acquired at a sampling rate of 500Hz  and reviewed with a high frequency filter of 70Hz  and a low frequency filter of 1Hz . EEG data were recorded continuously and digitally stored.   DESCRIPTION: No clear posterior dominant rhythm was seen. Sleep was characterized by vertex waves, maximal frontocentral. EEG showed continuous generalized, maximal left temporal region, 3-6hz  theta-delta slowing. Frequent sharp waves were seen in left anterior temporal ( F7/T7) region. Hyperventilation and photic stimulation were not performed.  ABNORMALITY - Sharp waves, left anterior temporal, maximal F7/T7 - Continuous slow, generalized and maximal left anterior temporal  IMPRESSION: This study showed epileptogenicity and cortical dysfunction in left anterior temporal region. Additionally, there is evidence of mild to moderate diffuse encephalopathy, non specific to etiology. No seizures were seen throughout the recording.  Tessah Patchen Barbra Sarks

## 2019-10-16 NOTE — Consult Note (Signed)
Physical Medicine and Rehabilitation Consult   Reason for Consult: Encephalitis Referring Physician: Dr.Narendra   HPI: Eric Larsen is a 86 y.o. male with history of hypothyroidism, hereditary spherocytosis, gait disorder--left shoulder and bilateral hip Fx in the past,  recent diagnosis of dementia, 1 week history of intermittent confusion with progressive weakness.  Wife also reports progressive weight loss with weakness for past 6 months, difficulty managing fiances as well as problems with memory.  Uses cane and hold on to the walls for walking.    He was admitted on 10/14/2019 for work-up and was febrile in ED with temp of 102.6.  MRI brain showed left anterior and medial temporal lobe with thickening and T2 hyperintense gyri with edema and small area of non-mass-like enhancement left anterior temporal lobe.  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  EEG showed sharp waves in sharp waves in left anterior temporal lesion with mild to moderate diffuse encephalopathy and she was loaded with Keppra.  He continues to have issues with confusion and on LT-EEG. LP ordered for work up.  Swallow evaluations revealed dysphagia with tendency to hold orals therefore placed on dysphagia 1, thin liquids.  Physical therapy evaluations completed revealing deficits in mobility and CIR recommended due to functional decline   Review of Systems  Unable to perform ROS: Mental acuity     Past Medical History:  Diagnosis Date  . Hypothyroidism   . Osteoporosis   . Thyroid disease     Past Surgical History:  Procedure Laterality Date  . HIP FRACTURE SURGERY    . INTRAMEDULLARY (IM) NAIL INTERTROCHANTERIC Right 08/13/2017   Procedure: INTRAMEDULLARY (IM) NAIL INTERTROCHANTRIC;  Surgeon: Earnestine Leys, MD;  Location: [facility] ORS;  Service: Orthopedics;  Laterality: Right;  . SHOULDER SURGERY      Family History  Problem Relation Age of Onset  . Hereditary  spherocytosis Father   . Hereditary spherocytosis Sister     Social History: Married--2nd marriage X 70 years and daughters live out of town. Retired Air traffic controller.  Per reports that he smoked for about 30 years. He has never used smokeless tobacco. Per reports--has a mixed drink at nights with beer occasionally.  He does not use drugs.    Allergies: No Known Allergies    Medications Prior to Admission  Medication Sig Dispense Refill  . acetaminophen (TYLENOL) 325 MG tablet Take 2 tablets (650 mg total) by mouth every 6 (six) hours as needed for mild pain or headache (or Fever >/= 101).    . calcium-vitamin D (OSCAL WITH D) 500-200 MG-UNIT TABS tablet Take 1 tablet by mouth 2 (two) times daily with a meal.    . Cholecalciferol 25 MCG (1000 UT) CHEW Chew 2,000 Units by mouth daily.    Marland Kitchen donepezil (ARICEPT) 5 MG tablet Take 1 tablet by mouth at bedtime.    . folic acid (FOLVITE) [id number] MCG tablet Take 400 mcg by mouth daily.    . folic acid (FOLVITE) [id number] MCG tablet Take 800 mcg by mouth daily.    . Multiple Vitamin (MULTIVITAMIN) capsule Take 1 capsule by mouth daily.    . Multiple Vitamins-Minerals (PRESERVISION AREDS PO) Take 1 tablet by mouth every evening.    . Niacin (VITAMIN B-3 PO) Take 1 tablet by mouth daily.    Marland Kitchen SYNTHROID 125 MCG tablet Take 125 mcg by mouth daily.    Marland Kitchen enoxaparin (LOVENOX) 40 MG/0.4ML injection Inject 0.4 mLs (40 mg total) into the  skin daily for 14 days. (Patient not taking: Reported on 10/14/2019) 14 Syringe 0  . ferrous sulfate 325 (65 FE) MG tablet Take 1 tablet (325 mg total) by mouth daily with breakfast. (Patient not taking: Reported on 09/07/2017) 30 tablet 0  . HYDROcodone-acetaminophen (NORCO/VICODIN) 5-325 MG tablet Take 1 tablet by mouth every 4 (four) hours as needed for moderate pain or severe pain. (Patient not taking: Reported on 09/07/2017) 30 tablet 0  . metoprolol tartrate (LOPRESSOR) 25 MG tablet Take 0.5 tablets (12.5 mg total) by mouth 2 (two) times  daily. (Patient not taking: Reported on 09/07/2017) 15 tablet 0  . polyethylene glycol (MIRALAX / GLYCOLAX) packet Take 17 g by mouth daily. (Patient not taking: Reported on 09/07/2017) 30 each 0  . senna (SENOKOT) 8.6 MG TABS tablet Take 1 tablet (8.6 mg total) by mouth 2 (two) times daily. (Patient not taking: Reported on 09/07/2017) 60 each 0    Home: Home Living Family/patient expects to be discharged to:: Private residence Living Arrangements: Spouse/significant other Available Help at Discharge: Family, Available 24 hours/day Type of Home: House Home Access: Stairs to enter [employer] of Steps: 4 Entrance Stairs-Rails: Right, Left, Can reach both Home Layout: Two level, Able to live on main level with bedroom/bathroom Alternate Level Stairs-Number of Steps: Flight Alternate Level Stairs-Rails: Right Bathroom Shower/Tub: Insurance claims handler: Civil engineer, contracting, Grab bars - tub/shower, Environmental consultant - 4 wheels  Functional History: Prior Function Level of Independence: Needs assistance Gait / Transfers Assistance Needed: Walks independently; assist needed recently to go up and down the flight of stairs to reach bedroom Comments: Had gone up and down the flight of steps in his home as recently as 2/26 Functional Status:  Mobility: Bed Mobility Overal bed mobility: Needs Assistance Bed Mobility: Rolling, Sidelying to Sit Rolling: Max assist Sidelying to sit: Max assist General bed mobility comments: Max assist and use fo bed pad to cradle hips for roll; Max assist to elevate trunk to sitting Transfers Overall transfer level: Needs assistance Equipment used: 2 person hand held assist Transfers: Sit to/from Stand Sit to Stand: Max assist, +2 physical assistance, +2 safety/equipment General transfer comment: Max assist to steady with rise to stand; Mr Kennon noted to have good knee and hip extension to stand, however significant posterior lean, requiring Max assist to   shift weight anteriorly over feet Ambulation/Gait Assistive device: 2 person hand held assist General Gait Details: Attempted to march in place at EOB with +2 assist; multimodal cueing for weight shifts into stance R and L; able to lift R foot, unable to clear floor with L foot    ADL:    Cognition: Cognition Overall Cognitive Status: Impaired/Different from baseline Orientation Level: Oriented to person, Disoriented to place, Disoriented to time, Disoriented to situation Cognition Arousal/Alertness: Lethargic(But more awake once sitting EOB) Behavior During Therapy: WFL for tasks assessed/performed Overall Cognitive Status: Impaired/Different from baseline Area of Impairment: Orientation, Attention, Following commands, Safety/judgement, Awareness, Problem solving Orientation Level: Disoriented to, Place, Time, Situation Following Commands: Follows one step commands with increased time Problem Solving: Slow processing, Decreased initiation, Difficulty sequencing, Requires verbal cues, Requires tactile cues General Comments: Made attempts at answering questions, however verbalizations were largely unintelligible  Blood pressure 116/64, pulse (!) 59, temperature 98.4 F (36.9 C), resp. rate 16, height 5\' 4"  (1.626 m), weight 53.5 kg, SpO2 95 %.  Physical Exam  Constitutional: He appears lethargic. He appears cachectic. He has a sickly appearance.  HEENT: Head is normocephalic, atraumatic, PERRLA,  EOMI, sclera anicteric, ext ear canals clear Mouth/Throat: Mucous membranes are dry.  Neck: Supple without JVD or lymphadenopathy Heart: Reg rate and rhythm. No murmurs rubs or gallops Chest: CTA bilaterally without wheezes, rales, or rhonchi; no distress Abdomen: Soft, non-tender, non-distended, bowel sounds positive. Extremities: No clubbing, cyanosis, or edema. Pulses are 2+ Skin: Clean and intact without signs of breakdown Neuro: Oriented to self only. Tangential speech in response to  other questions. Moderate to severe dysarthria with language of confusion noted and answered yes to most questions. Able to follow simple motor commands with verbal/tactile cues.  Difficulty following manual muscle testing but grossly 4-/5 in upper extremities and 3/5 in lower extremities.  Musculoskeletal: Full ROM, No pain with AROM or PROM in the neck, trunk, or extremities. Posture appropriate Psych: Pt's affect is appropriate. Pt is cooperative  Results for orders placed or performed during the hospital encounter of 10/14/19 (from the past 24 hour(s))  Glucose, capillary     Status: None   Collection Time: 10/15/19 11:32 AM  Result Value Ref Range   Glucose-Capillary 90 70 - 99 mg/dL  CBC     Status: Abnormal   Collection Time: 10/16/19  3:03 AM  Result Value Ref Range   WBC 9.9 4.0 - 10.5 K/uL   RBC 3.64 (L) 4.22 - 5.81 MIL/uL   Hemoglobin 12.4 (L) 13.0 - 17.0 g/dL   HCT 37.3 (L) 39.0 - 52.0 %   MCV 102.5 (H) 80.0 - 100.0 fL   MCH 34.1 (H) 26.0 - 34.0 pg   MCHC 33.2 30.0 - 36.0 g/dL   RDW 19.9 (H) 11.5 - 15.5 %   Platelets 201 150 - 400 K/uL   nRBC 0.0 0.0 - 0.2 %  Basic metabolic panel     Status: Abnormal   Collection Time: 10/16/19  3:03 AM  Result Value Ref Range   Sodium 140 135 - 145 mmol/L   Potassium 3.3 (L) 3.5 - 5.1 mmol/L   Chloride 105 98 - 111 mmol/L   CO2 26 22 - 32 mmol/L   Glucose, Bld 107 (H) 70 - 99 mg/dL   BUN 23 8 - 23 mg/dL   Creatinine, Ser 0.95 0.61 - 1.24 mg/dL   Calcium 8.5 (L) 8.9 - 10.3 mg/dL   GFR calc non Af Amer >60 >60 mL/min   GFR calc Af Amer >60 >60 mL/min   Anion gap 9 5 - 15   EEG  Result Date: 10/15/2019 Lora Havens, MD     10/15/2019  1:08 PM Patient Name: Yordany Synder MRN: [id number] Epilepsy Attending: Lora Havens Referring Physician/Provider: Dr Kerney Elbe Date: 10/15/2019 Duration: 25.02 mins Patient history: 86 year old male with progressive confusion. MRI showed left temporal lobe is abnormal with increased signal  on T2 and FLAIR and cortical thickening and medially and anteriorly. There is a small area of non masslike enhancement in the left anterior temporal lobe. EEG to evaluate for seizure. Level of alertness: Lethargic, asleep AEDs during EEG study: LEV Technical aspects: This EEG study was done with scalp electrodes positioned according to the 10-20 International system of electrode placement. Electrical activity was acquired at a sampling rate of 500Hz  and reviewed with a high frequency filter of 70Hz  and a low frequency filter of 1Hz . EEG data were recorded continuously and digitally stored. DESCRIPTION: No clear posterior dominant rhythm was seen. Sleep was characterized by vertex waves, maximal frontocentral. EEG showed continuous generalized, maximal left temporal region, 3-6hz  theta-delta slowing. Frequent sharp  waves were seen in left anterior temporal ( F7/T7) region. Hyperventilation and photic stimulation were not performed. ABNORMALITY - Sharp waves, left anterior temporal, maximal F7/T7 - Continuous slow, generalized and maximal left anterior temporal IMPRESSION: This study showed epileptogenicity and cortical dysfunction in left anterior temporal region. Additionally, there is evidence of mild to moderate diffuse encephalopathy, non specific to etiology. No seizures were seen throughout the recording. Dr Rory Percy was immediately notified. Lora Havens   DG Abd 1 View  Result Date: 10/15/2019 CLINICAL DATA:  Altered level of consciousness, MRI clearance EXAM: ABDOMEN - 1 VIEW COMPARISON:  None. FINDINGS: Supine frontal views of the abdomen and pelvis are obtained. Portions of the left hemidiaphragm are excluded by collimation. Cholecystectomy clips overlie right upper quadrant. Postsurgical changes from left hip arthroplasty and right hip ORIF. No other metallic foreign bodies. Bowel gas pattern is unremarkable. Lung bases are clear. IMPRESSION: 1. Postsurgical changes as above. 2. Unremarkable bowel  gas pattern. Electronically Signed   By: Randa Ngo M.D.   On: 10/15/2019 02:30   MR BRAIN WO CONTRAST  Result Date: 10/15/2019 CLINICAL DATA:  New diagnosis of dementia which is worsening recently. EXAM: MRI HEAD WITHOUT CONTRAST TECHNIQUE: Multiplanar, multiecho pulse sequences of the brain and surrounding structures were obtained without intravenous contrast. COMPARISON:  Head CT yesterday. FINDINGS: Brain: Diffusion imaging does not show any acute or subacute infarction. No focal abnormality affects the brainstem or cerebellum. Cerebral hemispheres show pronounced atrophy without lobar predominance. There are chronic small-vessel ischemic changes of the deep white matter. Ventricles appear in proportion to the sulci. There is confluent abnormal signal with sulcal thickening in the anterior and medial left temporal lobe. The appearance is worrisome for development of a primary brain neoplasm. Acute inflammation is felt less likely given this appearance, which does not show vasogenic edema. None the less, postcontrast imaging would be recommended to assess for enhancement within the lesion. Vascular: Major vessels at the base of the brain show flow. Skull and upper cervical spine: Negative Sinuses/Orbits: Clear/normal Other: None IMPRESSION: Generalized atrophy with chronic small-vessel ischemic changes of the white matter. Focal abnormality of the left anterior and medial temporal lobe with thickening and increased T2 signal. 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 Postcontrast imaging is recommended for further evaluation. Whereas the possibility of cerebritis does exist (herpes encephalitis), the pattern is much more suggestive of neoplastic infiltration. Electronically Signed   By: Nelson Chimes M.D.   On: 10/15/2019 04:35   MR BRAIN W WO CONTRAST  Result Date: 10/15/2019 CLINICAL DATA:  Progressive confusion. Abnormal left temporal lobe on MRI.  EXAM: MRI HEAD WITH CONTRAST TECHNIQUE: Multiplanar, multiecho pulse sequences of the brain and surrounding structures were obtained with intravenous contrast. CONTRAST:  34mL GADAVIST GADOBUTROL 1 MMOL/ML IV SOLN COMPARISON:  Unenhanced MRI brain earlier today FINDINGS: Postcontrast images were obtained to the brain and are mildly degraded by motion. Left temporal lobe abnormality showed increased signal on T2 and FLAIR with thickening of the medial and anterior temporal cortex. There is mild enhancement of the left anterior temporal lobe without masslike features. Remainder of the thickening in the left temporal lobe does not show abnormal enhancement. Left temporal lobe is dilated but less so than that seen on the right. This is likely due to edema of the left temporal lobe. No other areas of abnormal enhancement. Generalized atrophy with ventricular enlargement as noted previously. IMPRESSION: Left temporal lobe is abnormal with increased signal on  T2 and FLAIR and cortical thickening and medially and anteriorly. There is a small area of non masslike enhancement in the left anterior temporal lobe. The patient was febrile on admission. 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 Infiltrating tumor also in the differential. Electronically Signed   By: Franchot Gallo M.D.   On: 10/15/2019 09:11   Overnight EEG with video  Result Date: 10/16/2019 Lora Havens, MD     10/16/2019  8:34 AM Patient Name: Tawan Tabet MRN: [id number] Epilepsy Attending: Lora Havens Referring Physician/Provider: Dr Amie [place] Duration: 10/15/2019 1257 to 2/29/2021 0830  Patient history: 86 year old male with progressive confusion. MRI showed left temporal lobe is abnormal with increased signal on T2 and FLAIR and cortical thickening and medially and anteriorly. There is a small area of non masslike enhancement in the left anterior temporal lobe. EEG to evaluate for seizure.  Level of alertness: Lethargic,  asleep  AEDs during EEG study: LEV  Technical aspects: This EEG study was done with scalp electrodes positioned according to the 10-20 International system of electrode placement. Electrical activity was acquired at a sampling rate of 500Hz  and reviewed with a high frequency filter of 70Hz  and a low frequency filter of 1Hz . EEG data were recorded continuously and digitally stored.  DESCRIPTION: No clear posterior dominant rhythm was seen. Sleep was characterized by vertex waves, maximal frontocentral. EEG showed continuous generalized, maximal left temporal region, 3-6hz  theta-delta slowing. Frequent sharp waves were seen in left anterior temporal ( F7/T7) region. Hyperventilation and photic stimulation were not performed.  ABNORMALITY - Sharp waves, left anterior temporal, maximal F7/T7 - Continuous slow, generalized and maximal left anterior temporal  IMPRESSION: This study showed epileptogenicity and cortical dysfunction in left anterior temporal region. Additionally, there is evidence of mild to moderate diffuse encephalopathy, non specific to etiology. No seizures were seen throughout the recording. 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 Does the need for close, 24 hr/day medical supervision in concert with the patient's rehab needs make it unreasonable for this patient to be served in a less intensive setting? Yes 2. Co-Morbidities requiring supervision/potential complications: hypothyroidism, hereditary spherocytosis, gait disorder, left shoulder and bilateral hip fracture in the past, recent diagnosis of dementia 3. Due to bladder management, bowel management, safety, skin/wound care, disease management, medication administration, pain management and patient education, does the patient require 24 hr/day rehab nursing? Yes 4. Does the patient require coordinated care of a physician, rehab nurse, therapy  disciplines of PT, OT, SLP to address physical and functional deficits in the context of the above medical diagnosis(es)? Yes Addressing deficits in the following areas: balance, endurance, locomotion, strength, transferring, bowel/bladder control, bathing, dressing, feeding, grooming, toileting, cognition, speech, language, swallowing and psychosocial support 5. Can the patient actively participate in an intensive therapy program of at least 3 hrs of therapy per day at least 5 days per week? Yes 6. The potential for patient to make measurable gains while on inpatient rehab is excellent 7. Anticipated functional outcomes upon discharge from inpatient rehab are min assist  with PT, min assist with OT, supervision with SLP. 8. Estimated rehab length of stay to reach the above functional goals is: 16-20 days 9. Anticipated discharge destination: Home 10. Overall Rehab/Functional Prognosis: excellent  RECOMMENDATIONS: This patient's condition is appropriate for continued rehabilitative care in the following setting: CIR Patient has agreed to participate in recommended program. Yes Note that insurance  prior authorization may be required for reimbursement for recommended care.  Comment: Mr. Gerow would be an excellent CIR candidate once medically cleared; plan for LP today and undergoing EEG monitoring to determine cause of encephalitis. He will have the support of his wife at home.  Thank you for this consult.  Bary Leriche, PA-C 10/16/2019   I have personally performed a face to face diagnostic evaluation, including, but not limited to relevant history and physical exam findings, of this patient and developed relevant assessment and plan.  Additionally, I have reviewed and concur with the physician assistant's documentation above.  Leeroy Cha, MD

## 2019-10-16 NOTE — Progress Notes (Addendum)
Neurology Progress Note   S:// Patient lying in bed on cEEG, resting comfortably in NAD.  Patient is easily aroused to loud voice and minimal stimulation.  He is confused and not following commands.  NO family at bedside    O:// Current vital signs: BP 116/64 (BP Location: Right Arm)   Pulse (!) 59   Temp 98.4 F (36.9 C)   Resp 16   Ht 5\' 4"  (1.626 m)   Wt 53.5 kg   SpO2 95%   BMI 20.25 kg/m  Vital signs in last 24 hours: Temp:  [97.8 F (36.6 C)-99.2 F (37.3 C)] 98.4 F (36.9 C) (03/01 0528) Pulse Rate:  [59-77] 59 (03/01 0528) Resp:  [15-18] 16 (03/01 0528) BP: (116-131)/(64-72) 116/64 (03/01 0528) SpO2:  [95 %-98 %] 95 % (03/01 0528)   GENERAL: Awakens easily, alert in NAD, confused, will track HENT: - Normocephalic and atraumatic, dry mm Eyes: normal conjunctiva LUNGS - Clear to auscultation bilaterally with no wheezes CV - S1S2 RRR, no m/r/g, equal pulses bilaterally. ABDOMEN - Soft, nontender, nondistended with normoactive BS Ext: warm, well perfused, intact peripheral pulses, no edema NEURO:  Mental Status: alert and confused Language: speech is incomprehensible, says "Yes" to most questions Cranial Nerves: PERRL 38mm/brisk. EOMI, visual fields full, no facial asymmetry, facial sensation intact, hearing intact, tongue/uvula/soft palate midline, normal sternocleidomastoid and trapezius muscle strength. No evidence of tongue atrophy or fibrillations Motor: all 4 extremities are antigravity, he will hold uppers up for a few minutes and then drops to the bed  Tone: is normal and bulk is normal Sensation- Intact to light touch bilaterally, withdraws in all 4  Coordination: unable to assess  Gait- deferred   Medications  Current Facility-Administered Medications:    0.9 %  sodium chloride infusion, , Intravenous, PRN, Aldine Contes, MD, Last Rate: 10 mL/hr at 10/15/19 0616, 250 mL at 10/15/19 0616   acetaminophen (TYLENOL) tablet 650 mg, 650 mg, Oral, Q6H PRN  **OR** acetaminophen (TYLENOL) suppository 650 mg, 650 mg, Rectal, Q6H PRN, Sherry Ruffing, Marissa M, MD   acyclovir (ZOVIRAX) 500 mg in dextrose 5 % 100 mL IVPB, 500 mg, Intravenous, Q12H, Erenest Blank, RPH, Stopped at 10/15/19 2224   levETIRAcetam (KEPPRA) IVPB 500 mg/100 mL premix, 500 mg, Intravenous, Q12H, Amie Portland, MD, Stopped at 10/15/19 2049   levothyroxine (SYNTHROID) tablet 125 mcg, 125 mcg, Oral, QAC breakfast, Aslam, Sadia, MD, 125 mcg at 10/16/19 0526 Labs CBC    Component Value Date/Time   WBC 9.9 10/16/2019 0303   RBC 3.64 (L) 10/16/2019 0303   HGB 12.4 (L) 10/16/2019 0303   HCT 37.3 (L) 10/16/2019 0303   PLT 201 10/16/2019 0303   MCV 102.5 (H) 10/16/2019 0303   MCH 34.1 (H) 10/16/2019 0303   MCHC 33.2 10/16/2019 0303   RDW 19.9 (H) 10/16/2019 0303   LYMPHSABS 1.2 10/14/2019 0704   MONOABS 1.0 10/14/2019 0704   EOSABS 0.0 10/14/2019 0704   BASOSABS 0.0 10/14/2019 0704    CMP     Component Value Date/Time   NA 140 10/16/2019 0303   K 3.3 (L) 10/16/2019 0303   CL 105 10/16/2019 0303   CO2 26 10/16/2019 0303   GLUCOSE 107 (H) 10/16/2019 0303   BUN 23 10/16/2019 0303   CREATININE 0.95 10/16/2019 0303   CALCIUM 8.5 (L) 10/16/2019 0303   PROT 6.5 10/15/2019 0439   ALBUMIN 3.6 10/15/2019 0439   AST 34 10/15/2019 0439   ALT 24 10/15/2019 0439   ALKPHOS 52 10/15/2019  0439   BILITOT 2.8 (H) 10/15/2019 0439   GFRNONAA >60 10/16/2019 0303   GFRAA >60 10/16/2019 0303    glycosylated hemoglobin  Lipid Panel  No results found for: CHOL, TRIG, HDL, CHOLHDL, VLDL, LDLCALC, LDLDIRECT   Imaging I have reviewed images in epic and the results pertinent to this consultation are:  CT-scan of the brain no acute abnormality  MRI examination of the brain  Left temporal lobe is abnormal with increased signal on T2 and FLAIR and cortical thickening and medially and anteriorly. small area of non masslike enhancement in the left anterior temporal lobe.  cEEG left  anterior temporal sharps, epileptogenicity and cortical dysfunction in left anterior temporal region, no seizures   Assessment:  86 yo male who presented to the hospital with increasing confusion for 1 week in the setting of recent diagnosis of dementia.  Some improvement was noted after adjustments to levothyroxine.  Confusion continued with generalized weakness, urinary incontinence and worsening cough.  In the ED he was noted to be febrile @102 . 6.   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   I have reviewed the contents of history and physical exam as documented by PA/ARNP/Resident and agree with above documentation.  I have discussed and formulated the above plan as documented. Edits to the note have been made as needed.  Patient more alert. Will obtain LP - attempted to call family to get consent, but could not reach, will try again.  Repeat PT/INR and aPTT. D/C EEG, no seizures, continue Keppra as EEG did show epileptogenicity.       Karena Addison Ryonna Cimini MD [facility] [id number]   If 7pm to 7am, please call on call as listed on AMION.

## 2019-10-16 NOTE — Progress Notes (Signed)
  Speech Language Pathology Treatment: Dysphagia  Patient Details Name: Eric Larsen MRN: [id number] DOB: 08-06-1931 Today's Date: 10/16/2019 Time: 1151-1207 SLP Time Calculation (min) (ACUTE ONLY): 16 min  Assessment / Plan / Recommendation Clinical Impression  Therapeutic session with pt and his wife at bedside concentrating on appropriateness of current texture, modifications needed and education with family. Wife reports he was pocketing with more solid textures which has resolved since having puree. She reported he ate "a good meal last night; it looked and smelled so good." After lid removed, pt self fed chocolate pudding independently with adequate pace/volume and consumed water via straw without incident. Oral cavity cleared after bites. Wife states he seems to be doing better this morning. Regular solids not attempted as SLP and wife agreed purees are most efficient for him presently. Educated on general precautions to eat in upright posture. Will continue treatment for readiness to increase food texture.    HPI HPI: [name] is an  86 y.o. yo male w/ PMH significant for hypothyroidism, hyperlipidemia, hereditary spherocytosis and recent diagnosis of dementia presenting with one week of progressively worsening confusion.  MRI brain on 2/28 reported: "Left temporal lobe is abnormal with increased signal on T2 and FLAIR and cortical thickening and medially and anteriorly. There is a small area of non masslike enhancement in the left anterior temporal lobe. The patient was febrile on admission. 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 Infiltrating tumor also in the differential.      SLP Plan  Continue with current plan of care       Recommendations  Diet recommendations: Dysphagia 1 (puree);Thin liquid Liquids provided via: Cup;Straw Medication Administration: Crushed with puree Supervision: Patient able to self feed;Full supervision/cueing for  compensatory strategies Compensations: Minimize environmental distractions;Slow rate;Small sips/bites Postural Changes and/or Swallow Maneuvers: Seated upright 90 degrees                Oral Care Recommendations: Oral care BID Follow up Recommendations: 24 hour supervision/assistance SLP Visit Diagnosis: Dysphagia, unspecified (R13.10) Plan: Continue with current plan of care       GO                Houston Siren 10/16/2019, 12:11 PM

## 2019-10-16 NOTE — Procedures (Signed)
LP Procedure Note:  Patient has been seen and examined.  Chart has been reviewed.  LP is being performed to to evaluate for HSV.Marland Kitchen  Procedure has been explained to patient/family including risks and benefits.  Consent has been signed by patient/family and witnessed.   Blood pressure 123/66, pulse (!) 57, temperature 98.5 F (36.9 C), temperature source Oral, resp. rate 18, height 5\' 4"  (1.626 m), weight 53.5 kg, SpO2 96 %.   Current Facility-Administered Medications:  .  0.9 %  sodium chloride infusion, , Intravenous, PRN, Aldine Contes, MD, Last Rate: 10 mL/hr at 10/15/19 0616, 250 mL at 10/15/19 0616 .  acetaminophen (TYLENOL) tablet 650 mg, 650 mg, Oral, Q6H PRN **OR** acetaminophen (TYLENOL) suppository 650 mg, 650 mg, Rectal, Q6H PRN, Sherry Ruffing, Marissa M, MD .  acyclovir (ZOVIRAX) 500 mg in dextrose 5 % 100 mL IVPB, 500 mg, Intravenous, Q12H, Erenest Blank, RPH, Last Rate: 110 mL/hr at 10/16/19 1042, 500 mg at 10/16/19 1042 .  levETIRAcetam (KEPPRA) IVPB 500 mg/100 mL premix, 500 mg, Intravenous, Q12H, Amie Portland, MD, Last Rate: 400 mL/hr at 10/16/19 1010, 500 mg at 10/16/19 1010 .  levothyroxine (SYNTHROID) tablet 125 mcg, 125 mcg, Oral, QAC breakfast, Aslam, Sadia, MD, 125 mcg at 10/16/19 0526 .  lidocaine (XYLOCAINE) 1 % (with pres) injection 10 mL, 10 mL, Infiltration, Once, Layanna Charo, Karena Addison R, MD .  potassium chloride SA (KLOR-CON) CR tablet 20 mEq, 20 mEq, Oral, TID, Seawell, Jaimie A, DO, 20 mEq at 10/16/19 1049  Recent Labs    10/14/19 0704 10/14/19 0704 10/15/19 0439 10/16/19 0303 10/16/19 0852  WBC 7.3   < > 12.4* 9.9  --   HGB 12.3*   < > 13.8 12.4*  --   HCT 37.1*   < > 41.2 37.3*  --   PLT 206   < > 187 201  --   INR 1.3*  --   --   --  1.2   < > = values in this interval not displayed.    CT of head:  Patient was placed in the lateral decub/sitting position.  Area was cleaned with betadine and anesthetized with lidocaine.  Under sterile conditions 20G LP  needle was placed at approximately L3-4 without difficulty.  Opening pressure  Not checked. Approximately 12cc of clear fluid was obtained and sent for studies.  No complications were noted.    Karena Addison [facility] [phone]

## 2019-10-16 NOTE — Progress Notes (Addendum)
OT Cancellation Note  Patient Details Name: Eric Larsen MRN: [id number] DOB: 09/07/1930   Cancelled Treatment:    Reason Eval/Treat Not Completed: Patient at procedure or test/ unavailable (EEG), will follow up for OT eval as able.  Checked back this PM, RN reports pt to have lumbar puncture very shortly. Will follow up for OT eval as schedule permits.  Lou Cal, OT [facility] Pager [phone] Office [phone]   Raymondo Band 10/16/2019, 8:53 AM

## 2019-10-16 NOTE — Progress Notes (Signed)
Results for Eric Larsen, Eric Larsen (MRN [id number]) as of 10/16/2019 17:13  Ref. Range 10/16/2019 15:22  WBC, CSF Latest Ref Range: 0 - 5 /cu mm 84 (HH)   Dr. Lorraine Lax notified. No new orders received. Will continue to monitor pt.

## 2019-10-16 NOTE — Progress Notes (Signed)
LTM discontinued, results pending. Some redness noted at F8, no skin break down.

## 2019-10-16 NOTE — Progress Notes (Signed)
Late Entry    LTM maint complete -  FP1,FP2,P7,F7,F8  mild skin redness at FP1,FP2, F8

## 2019-10-16 NOTE — Progress Notes (Signed)
IV consult placed for new IV start. Pt has pulled out 2 IVs in a day. No meds are due until 3/1 at 10am. RN notified, and to place order for IV closer to time meds are to be given. Will remain available if any changes in med needs.  Randol Kern, RN VAST

## 2019-10-17 LAB — PATHOLOGIST SMEAR REVIEW

## 2019-10-17 LAB — BASIC METABOLIC PANEL
Anion gap: 7 (ref 5–15)
BUN: 20 mg/dL (ref 8–23)
CO2: 26 mmol/L (ref 22–32)
Calcium: 8.7 mg/dL — ABNORMAL LOW (ref 8.9–10.3)
Chloride: 105 mmol/L (ref 98–111)
Creatinine, Ser: 0.89 mg/dL (ref 0.61–1.24)
GFR calc Af Amer: >60 mL/min (ref >60)
GFR calc non Af Amer: >60 mL/min (ref >60)
Glucose, Bld: 117 mg/dL — ABNORMAL HIGH (ref 70–99)
Potassium: 3.8 mmol/L (ref 3.5–5.1)
Sodium: 138 mmol/L (ref 135–145)

## 2019-10-17 LAB — CBC
HCT: 38.6 % — ABNORMAL LOW (ref 39.0–52.0)
Hemoglobin: 13.3 g/dL (ref 13.0–17.0)
MCH: 34.3 pg — ABNORMAL HIGH (ref 26.0–34.0)
MCHC: 34.5 g/dL (ref 30.0–36.0)
MCV: 99.5 fL (ref 80.0–100.0)
Platelets: 231 10*3/uL (ref 150–400)
RBC: 3.88 MIL/uL — ABNORMAL LOW (ref 4.22–5.81)
RDW: 19.6 % — ABNORMAL HIGH (ref 11.5–15.5)
WBC: 9.3 10*3/uL (ref 4.0–10.5)
nRBC: 0 % (ref 0.0–0.2)

## 2019-10-17 MED ORDER — SENNOSIDES-DOCUSATE SODIUM 8.6-50 MG PO TABS
1.0000 | ORAL_TABLET | Freq: Two times a day (BID) | ORAL | Status: DC
  Administered 2019-10-17 – 2019-10-18 (×3): 1 via ORAL
  Filled 2019-10-17 (×3): qty 1

## 2019-10-17 MED ORDER — CALCIUM CARBONATE-VITAMIN D 500-200 MG-UNIT PO TABS
1.0000 | ORAL_TABLET | Freq: Two times a day (BID) | ORAL | Status: DC
  Administered 2019-10-17 – 2019-10-18 (×2): 1 via ORAL
  Filled 2019-10-17 (×2): qty 1

## 2019-10-17 MED ORDER — POLYETHYLENE GLYCOL 3350 17 G PO PACK: 17.0000 g | PACK | Freq: Every day | ORAL | Status: DC | PRN

## 2019-10-17 MED ORDER — SODIUM CHLORIDE 0.9 % IV SOLN: INTRAVENOUS | Status: DC

## 2019-10-17 MED ORDER — FOLIC ACID 1 MG PO TABS
1000.0000 ug | ORAL_TABLET | Freq: Every day | ORAL | Status: DC
  Administered 2019-10-17 – 2019-10-18 (×2): 1 mg via ORAL
  Filled 2019-10-17 (×2): qty 1

## 2019-10-17 NOTE — Progress Notes (Signed)
Physical Therapy Treatment Patient Details Name: Eric Larsen MRN: [id number] DOB: 1930/10/14 Today's Date: 10/17/2019    History of Present Illness Eric Larsen is an  86 y.o. yo male w/ PMH significant for hypothyroidism, hyperlipidemia, hereditary spherocytosis and recent diagnosis of dementia presenting with one week of progressively worsening confusion.  MRI brain on 2/28 reported: "Left temporal lobe is abnormal with increased signal on T2 and FLAIR and cortical thickening and medially and anteriorly. There is a small area of non masslike enhancement in the left anterior temporal lobe. The patient was febrile on admission. 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 Infiltrating tumor also in the differential."     PT Comments    Patient received in bed, pleasantly confused and oriented to self only but able to follow simple commands well enough for participation in PT today. See below for mobility and assist levels. Overall mobility improved today however he has quite limited functional activity tolerance- able to perform functional transfers with as little as MinAx2 when fresh but can require as much as Max-totalAx2 when fatigued. Able to tolerate sitting on stedy seat for approximately 4 minutes before fatiguing today. He was returned to bed and positioned to comfort with all needs met, bed alarm active and spouse present. Continue to recommend CIR.     Follow Up Recommendations  CIR     Equipment Recommendations  Rolling walker with 5" wheels;3in1 (PT)    Recommendations for Other Services       Precautions / Restrictions Precautions Precautions: Fall Restrictions Weight Bearing Restrictions: No    Mobility  Bed Mobility Overal bed mobility: Needs Assistance Bed Mobility: Rolling;Supine to Sit;Sit to Supine Rolling: Max assist Sidelying to sit: Max assist Supine to sit: Max assist Sit to supine: Total assist;+2 for physical assistance   General  bed mobility comments: increased tmie and effort, used rail and PT as an anchor to scoot hips forward at EOB  Transfers Overall transfer level: Needs assistance Equipment used: 2 person hand held assist;Ambulation equipment used Transfers: Sit to/from Stand Sit to Stand: Min assist;Max assist;+2 physical assistance         General transfer comment: min A +2 sit to stand from bed, max A +2 from [place] seat (pt fatigued)  Ambulation/Gait             General Gait Details: deferred- fatigue after sitting on stedy seats for 3-4 minutes   Stairs             Wheelchair Mobility    Modified Rankin (Stroke Patients Only)       Balance Overall balance assessment: Needs assistance Sitting-balance support: Feet supported Sitting balance-[name]: Poor   Postural control: Posterior lean Standing balance support: Bilateral upper extremity supported Standing balance-[name]: Poor                              Cognition Arousal/Alertness: Awake/alert Behavior During Therapy: WFL for tasks assessed/performed Overall Cognitive Status: Impaired/Different from baseline Area of Impairment: Orientation;Attention;Following commands;Safety/judgement;Awareness;Problem solving                 Orientation Level: Disoriented to;Place;Time;Situation Current Attention Level: Focused   Following Commands: Follows one step commands with increased time Safety/Judgement: Decreased awareness of safety Awareness: Intellectual Problem Solving: Slow processing;Decreased initiation;Difficulty sequencing;Requires verbal cues;Requires tactile cues General Comments: Made attempts at answering questions, however verbalizations were largely unintelligible, difficulty with word finding  Exercises      General Comments        Pertinent Vitals/Pain Pain Assessment: Faces Faces Pain Scale: Hurts a little bit Pain Location: generalized Pain Descriptors /  Indicators: Grimacing Pain Intervention(s): Limited activity within patient's tolerance;Monitored during session;Repositioned    Home Living Family/patient expects to be discharged to:: Private residence Living Arrangements: Spouse/significant other Available Help at Discharge: Family;Available 24 hours/day Type of Home: House Home Access: Stairs to enter Entrance Stairs-Rails: Right;Left;Can reach both Home Layout: Two level;Able to live on main level with bedroom/bathroom Home Equipment: Shower seat;Grab bars - tub/shower;Walker - 4 wheels      Prior Function Level of Independence: Needs assistance  Gait / Transfers Assistance Needed: Walks independently; assist needed recently to go up and down the flight of stairs to reach bedroom ADL's / Homemaking Assistance Needed: was independent with ADLs/selfcare, toileting, self feeding Comments: Had gone up and down the flight of steps in his home as recently as 2/26   PT Goals (current goals can now be found in the care plan section) Acute Rehab PT Goals Patient Stated Goal: Unable to state PT Goal Formulation: With family Time For Goal Achievement: 10/29/19 Potential to Achieve Goals: Good Progress towards PT goals: Progressing toward goals    Frequency    Min 3X/week      PT Plan Current plan remains appropriate    Co-evaluation              AM-PAC PT "6 Clicks" Mobility   Outcome Measure  Help needed turning from your back to your side while in a flat bed without using bedrails?: A Lot Help needed moving from lying on your back to sitting on the side of a flat bed without using bedrails?: A Lot Help needed moving to and from a bed to a chair (including a wheelchair)?: A Lot Help needed standing up from a chair using your arms (e.g., wheelchair or bedside chair)?: A Lot Help needed to walk in hospital room?: Total Help needed climbing 3-5 steps with a railing? : Total 6 Click Score: 10    End of Session    Activity Tolerance: Patient tolerated treatment well;Patient limited by fatigue Patient left: in bed;with call bell/phone within reach;with family/visitor present;with bed alarm set   PT Visit Diagnosis: Unsteadiness on feet (R26.81);Other abnormalities of gait and mobility (R26.89);Other symptoms and signs involving the nervous system (R29.898)     Time: 1103-1594 PT Time Calculation (min) (ACUTE ONLY): 30 min  Charges:  $Therapeutic Activity: 8-22 mins(co-tx with OT)                     Windell Norfolk, DPT, PN1   Supplemental Physical Therapist [place]    Pager [phone] Acute Rehab Office [phone]

## 2019-10-17 NOTE — Progress Notes (Signed)
NEUROLOGY PROGRESS NOTE    Subjective: Patient easily awakes to voice.  Patient is able to follow simple commands.  Patient remains confused.  He is unable to recognize his wife and or if he is in the hospital.  Exam: Vitals:   10/17/19 0612 10/17/19 1000  BP: 124/68 108/64  Pulse: 60 (!) 54  Resp: 18 18  Temp: 98.2 F (36.8 C) (!) 97.5 F (36.4 C)  SpO2: 94% 100%    ROS--unable to obtain secondary to confusion   Physical Exam  Constitutional: Appears well-developed and well-nourished.  Eyes: No scleral injection HENT: No OP obstrucion Head: Normocephalic.  Cardiovascular: Normal rate and regular rhythm.  Respiratory: Effort normal, non-labored breathing GI: Soft.  No distension. There is no tenderness.  Skin: WDI Neuro:  Mental Status: Alert, not oriented.  Dysarthric but able to follow commands.  At times I cannot understand what he was trying to say-more due to dysarthria. Cranial Nerves: II:  Visual fields grossly normal,  III,IV, VI: ptosis not present, extra-ocular motions intact bilaterally pupils equal, round, reactive to light and accommodation V,VII: smile symmetric, facial light touch sensation normal bilaterally VIII: hearing normal bilaterally IX,X: Palate rises midline XI: bilateral shoulder shrug XII: midline tongue extension Motor: All extremities remain antigravity.  Upper extremities are stronger than lower extremities. Sensory: Pinprick and light touch intact throughout, bilaterally Deep Tendon Reflexes: 2+ and symmetric throughout no Achilles Plantars: Right: downgoing   Left: downgoing   Medications:  Scheduled: . levothyroxine  125 mcg Oral QAC breakfast  . lidocaine  10 mL Infiltration Once  . senna-docusate  1 tablet Oral BID   Continuous: . sodium chloride 250 mL (10/15/19 0616)  . acyclovir 500 mg (10/17/19 1026)  . levETIRAcetam 500 mg (10/17/19 1006)    Pertinent Labs/Diagnostics: Results for FERMIN, HOSSEINI (MRN [id number])  as of 10/17/2019 11:43  Ref. Range 10/16/2019 15:22  Appearance, CSF Latest Ref Range: CLEAR  CLEAR  Glucose, CSF Latest Ref Range: 40 - 70 mg/dL 56  RBC Count, CSF Latest Ref Range: 0 /cu mm 6 (H)  WBC, CSF Latest Ref Range: 0 - 5 /cu mm 84 (HH)  Segmented Neutrophils-CSF Latest Ref Range: 0 - 6 % 0  Lymphs, CSF Latest Ref Range: 40 - 80 % 87 (H)  Monocyte-Macrophage-Spinal Fluid Latest Ref Range: 15 - 45 % 13 (L)  Eosinophils, CSF Latest Ref Range: 0 - 1 % 0  Color, CSF Latest Ref Range: COLORLESS  COLORLESS  Supernatant Unknown NOT INDICATED  Total  Protein, CSF Latest Ref Range: 15 - 45 mg/dL 139 (H)  Tube # Unknown 3   CSF culture-negative CSF pathologist smear and HSV pending   Overnight EEG with video  Result Date: 10/16/2019  IMPRESSION: This study showed epileptogenicity and cortical dysfunction in left anterior temporal region. Additionally, there is evidence of mild to moderate diffuse encephalopathy, non specific to etiology. No seizures were seen throughout the recording. [place] PA-C [facility] [phone]  Assessment:  This is an 86 year old male presented to the hospital with increasing confusion for 1 week in the setting of recently diagnosis of dementia.  Some improvement was noted after adjustments to levothyroxine.  Confusion continued with generalized weakness, urinary incontinence and worsening of cough.  Currently patient is arousable and able to follow simple commands but remains confused.  Thus far CSF HSV by PCR pending.  CSF shows to be predominanlyt lymphocytic.  Impression: -Likely acute encephalopathy secondary to HSV encephalitis  Recommendations: -  Continue Keppra IV -Continue acyclovir IV until HSV lab comes back -We will continue to follow.    10/17/2019, 11:38 AM

## 2019-10-17 NOTE — Progress Notes (Signed)
VAST consulted to obtain IV access. Pt with extremely long hair on arms that would interfere with proper dressing placement. Attempted to use hospital clippers, but broken. Utilized sterile suture scissors to remove as much hair as possible. No nicks or cuts. IV placed as charted.

## 2019-10-17 NOTE — Progress Notes (Addendum)
Inpatient Rehab Admissions:  Inpatient Rehab Consult received.  I met with patient and his wife at the bedside for rehabilitation assessment and to discuss goals and expectations of an inpatient rehab admission.  They are hopeful for CIR admission pending medical readiness.  Will reach out to MD today for possible admission.   Addendum: HSV results still pending, per Dr. Darrick Meigs.  Will look towards possible admission tomorrow/Thursday pending medical readiness and bed availability.   Signed: Shann Medal, PT, DPT Admissions Coordinator [phone] 10/17/19  10:49 AM

## 2019-10-17 NOTE — Progress Notes (Addendum)
   NAME:  Eric Larsen, MRN:  [id number], DOB:  07/06/1931, LOS: 3 ADMISSION DATE:  10/14/2019   Subjective/Interm history  No overnight events VSS Updated on CSF results. Objective   Blood pressure 125/74, pulse 66, temperature 98.1 F (36.7 C), resp. rate 18, height 5\' 4"  (1.626 m), weight 53.5 kg, SpO2 99 %.     Intake/Output Summary (Last 24 hours) at 10/17/2019 0602 Last data filed at 10/17/2019 0600 Gross per 24 hour  Intake 872.31 ml  Output 300 ml  Net 572.31 ml   Filed Weights   10/14/19 0615  Weight: 53.5 kg    Examination: GENERAL: in no acute distress CARDIAC: heart RRR.  PULMONARY: acyanotic. Lung sounds clear to auscultation. NEURO: much more alert today and improved word finding.  Consults:  Neuro  Significant Diagnostic Tests:   2/28 MRI brain>>thickening and increased T2 signal in the left anterior and medial temporal lobe.. findings most consistent with herpes encephalitis in the setting of febrile illness. Can not rule out infiltrating tumor.  EEG: epileptogenicity and cortical dysfunction in the left anterior temporal region.  LP CSF analysis: protein 139 (elevated), glucose 56(wnl), WBC 84 (elevated), Lymphocytes 87 (elevated), 0 neurotrophils and eosinophils  Micro Data:  CSF culture>> NGTD HSV DNA>> pending  Antimicrobials:  Acyclovir 2/28>>  Labs    CBC Latest Ref Rng & Units 10/16/2019 10/15/2019 10/14/2019  WBC 4.0 - 10.5 K/uL 9.9 12.4(H) 7.3  Hemoglobin 13.0 - 17.0 g/dL 12.4(L) 13.8 12.3(L)  Hematocrit 39.0 - 52.0 % 37.3(L) 41.2 37.1(L)  Platelets 150 - 400 K/uL 201 187 206   BMP Latest Ref Rng & Units 10/16/2019 10/15/2019 10/14/2019  Glucose 70 - 99 mg/dL 107(H) 95 105(H)  BUN 8 - 23 mg/dL 23 22 23   Creatinine 0.61 - 1.24 mg/dL 0.95 1.04 1.06  Sodium 135 - 145 mmol/L 140 139 136  Potassium 3.5 - 5.1 mmol/L 3.3(L) 3.4(L) 3.9  Chloride 98 - 111 mmol/L 105 103 101  CO2 22 - 32 mmol/L 26 24 26   Calcium 8.9 - 10.3 mg/dL 8.5(L) 8.7(L)  8.7(L)    Summary  86 yo male with PMH of hypothyroidism and hereditary spherocytosis who is presenting with 1w hx of progressive confusion, weakness, urinary incontinence and cough.  Assessment & Plan:  Active Problems:   Sepsis (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   Underwent LP 3/1. 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 HSV DNA pending Overall mental status improving. Plan Continue acyclovir until hsv returns--day #3/14 Continue keppra Consider PICC placement once HSV returns  Physical deconditioning. PT recommending CIR who is ready to accept.   Dementia. Continue to hold donepezil until mental status improves.  Best practice:  CODE STATUS: FULL Diet: dysphagia 1 DVT for prophylaxis: SCDs Dispo: CIR. Will need PICC prior to discharge if requiring 2w of acyclovir   Mitzi Hansen, MD INTERNAL MEDICINE RESIDENT PGY-1 PAGER #: [phone] 10/17/19  6:02 AM

## 2019-10-17 NOTE — Evaluation (Signed)
Occupational Therapy Evaluation Patient Details Name: Eric Larsen MRN: [id number] DOB: 30-Mar-1931 Today's Date: 10/17/2019    History of Present Illness Eric Larsen is an  86 y.o. yo male w/ PMH significant for hypothyroidism, hyperlipidemia, hereditary spherocytosis and recent diagnosis of dementia presenting with one week of progressively worsening confusion.  MRI brain on 2/28 reported: "Left temporal lobe is abnormal with increased signal on T2 and FLAIR and cortical thickening and medially and anteriorly. There is a small area of non masslike enhancement in the left anterior temporal lobe. The patient was febrile on admission. 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 Infiltrating tumor also in the differential."    Clinical Impression   Pt with decline in function and safety with ADLs and ADL mobility with impaired strength, balance, endurance and cognition. Pt with hx of dementia, however his wife staes this current confusion is not his baseline. Pt madeattempts at answering questions, however verbalizations were largely unintelligible, difficulty with word finding. [name] reports that he was independent with ADLs/selfcare, toileting and self feeding and used a cane for mobility. Pt currently requires max A + 2 to sit EOB, min A with LB ADLs, max - total A with LB ADLs, total A with toileting and min - max A +2 using Stedy for sit - stand. Pt would benefit from acute OT services to address impairments to maximize level of function and safety    Follow Up Recommendations  CIR    Equipment Recommendations  Other (comment)(TBD at next venue of care)    Recommendations for Other Services Rehab consult     Precautions / Restrictions Precautions Precautions: Fall Restrictions Weight Bearing Restrictions: No      Mobility Bed Mobility Overal bed mobility: Needs Assistance Bed Mobility: Rolling;Supine to Sit;Sit to Supine Rolling: Max assist Sidelying to  sit: Max assist Supine to sit: Max assist Sit to supine: Total assist;+2 for physical assistance   General bed mobility comments: increased tmie and effort, used rail and PT as an anchor to scoot hips forward at EOB  Transfers Overall transfer level: Needs assistance Equipment used: 2 person hand held assist;Ambulation equipment used Transfers: Sit to/from Stand Sit to Stand: Min assist;Max assist;+2 physical assistance         General transfer comment: min A +2 sit to stand from bed, max A +2 from [place] seat (pt fatigued)    Balance Overall balance assessment: Needs assistance Sitting-balance support: Feet supported Sitting balance-[name]: Poor   Postural control: Posterior lean Standing balance support: Bilateral upper extremity supported Standing balance-[name]: Poor                             ADL either performed or assessed with clinical judgement   ADL Overall ADL's : Needs assistance/impaired Eating/Feeding: Set up;Supervision/ safety;Sitting;Bed level   Grooming: Wash/dry hands;Wash/dry face;Min guard;Sitting   Upper Body Bathing: Minimal assistance   Lower Body Bathing: Maximal assistance   Upper Body Dressing : Minimal assistance   Lower Body Dressing: Total assistance   Toilet Transfer: Minimal assistance;Maximal assistance;+2 for physical assistance Toilet Transfer Details (indicate cue type and reason): sit - stand from EOB min A +2, max A +2 from [place]- Clothing Manipulation and Hygiene: Total assistance;Bed level       Functional mobility during ADLs: Minimal assistance;Maximal assistance;+2 for physical assistance       Vision Baseline Vision/History: Wears glasses Patient Visual Report: No change from baseline  Perception     Praxis      Pertinent Vitals/Pain Pain Assessment: Faces Faces Pain Scale: Hurts a little bit Pain Location: generalized Pain Descriptors / Indicators: Grimacing Pain  Intervention(s): Limited activity within patient's tolerance;Monitored during session;Repositioned     Hand Dominance Right   Extremity/Trunk Assessment Upper Extremity Assessment Upper Extremity Assessment: Generalized weakness   Lower Extremity Assessment Lower Extremity Assessment: Defer to PT evaluation   Cervical / Trunk Assessment Cervical / Trunk Assessment: Kyphotic   Communication Communication Communication: HOH   Cognition Arousal/Alertness: Awake/alert Behavior During Therapy: WFL for tasks assessed/performed Overall Cognitive Status: Impaired/Different from baseline Area of Impairment: Orientation;Attention;Following commands;Safety/judgement;Awareness;Problem solving                 Orientation Level: Disoriented to;Place;Time;Situation Current Attention Level: Focused   Following Commands: Follows one step commands with increased time Safety/Judgement: Decreased awareness of safety Awareness: Intellectual Problem Solving: Slow processing;Decreased initiation;Difficulty sequencing;Requires verbal cues;Requires tactile cues General Comments: Made attempts at answering questions, however verbalizations were largely unintelligible, difficulty with word finding   General Comments       Exercises     Shoulder Instructions      Home Living Family/patient expects to be discharged to:: Private residence Living Arrangements: Spouse/significant other Available Help at Discharge: Family;Available 24 hours/day Type of Home: House Home Access: Stairs to enter [employer] of Steps: 4 Entrance Stairs-Rails: Right;Left;Can reach both Home Layout: Two level;Able to live on main level with bedroom/bathroom Alternate Level Stairs-Number of Steps: Flight Alternate Level Stairs-Rails: Right Bathroom Shower/Tub: Occupational psychologist: Standard     Home Equipment: Shower seat;Grab bars - tub/shower;Walker - 4 wheels          Prior  Functioning/Environment Level of Independence: Needs assistance  Gait / Transfers Assistance Needed: Walks independently; assist needed recently to go up and down the flight of stairs to reach bedroom ADL's / [place] Needed: was independent with ADLs/selfcare, toileting, self feeding   Comments: Had gone up and down the flight of steps in his home as recently as 2/26        OT Problem List: Decreased strength;Impaired balance (sitting and/or standing);Decreased cognition;Decreased safety awareness;Decreased activity tolerance;Decreased knowledge of use of DME or AE      OT Treatment/Interventions: Self-care/ADL training;DME and/or AE instruction;Therapeutic activities;Balance training;Therapeutic exercise;Patient/family education    OT Goals(Current goals can be found in the care plan section) Acute Rehab OT Goals Patient Stated Goal: Unable to state OT Goal Formulation: With patient/family Time For Goal Achievement: 10/31/19 Potential to Achieve Goals: Good ADL Goals Pt Will Perform Grooming: with set-up;with supervision;sitting Pt Will Perform Upper Body Bathing: with min guard assist;sitting Pt Will Perform Lower Body Bathing: with mod assist;sitting/lateral leans Pt Will Perform Upper Body Dressing: with min guard assist;sitting Pt Will Transfer to Toilet: with min assist;stand pivot transfer;bedside commode  OT Frequency: Min 2X/week   Barriers to D/C:            Co-evaluation              AM-PAC OT "6 Clicks" Daily Activity     Outcome Measure Help from another person eating meals?: None Help from another person taking care of personal grooming?: A Little Help from another person toileting, which includes using toliet, bedpan, or urinal?: Total Help from another person bathing (including washing, rinsing, drying)?: A Lot Help from another person to put on and taking off regular upper body clothing?: A Little Help from another person  to put on and  taking off regular lower body clothing?: Total 6 Click Score: 14   End of Session Equipment Utilized During Treatment: Other (comment)(Stedy)  Activity Tolerance: Patient limited by fatigue Patient left: in bed;with call bell/phone within reach;with bed alarm set;with family/visitor present  OT Visit Diagnosis: Unsteadiness on feet (R26.81);Other abnormalities of gait and mobility (R26.89);Muscle weakness (generalized) (M62.81);Other symptoms and signs involving cognitive function                Time: [id number] OT Time Calculation (min): 32 min Charges:  OT General Charges $OT Visit: 1 Visit OT Evaluation $OT Eval Moderate Complexity: 1 Mod    Britt Bottom 10/17/2019, 2:06 PM

## 2019-10-18 ENCOUNTER — Inpatient Hospital Stay (HOSPITAL_COMMUNITY)
Admission: RE | Admit: 2019-10-18 | Discharge: 2019-11-17 | DRG: 091 | Disposition: A | Discharge status: Skilled Nursing Facility | Payer: Medicare Other | Source: Intra-hospital | Attending: Physical Medicine & Rehabilitation | Admitting: Physical Medicine & Rehabilitation

## 2019-10-18 ENCOUNTER — Inpatient Hospital Stay: Payer: Self-pay

## 2019-10-18 ENCOUNTER — Encounter (HOSPITAL_COMMUNITY): Payer: Self-pay | Admitting: Internal Medicine

## 2019-10-18 ENCOUNTER — Encounter (HOSPITAL_COMMUNITY): Payer: Self-pay | Admitting: Physical Medicine & Rehabilitation

## 2019-10-18 ENCOUNTER — Other Ambulatory Visit: Payer: Self-pay

## 2019-10-18 DIAGNOSIS — Z66 Do not resuscitate: Secondary | ICD-10-CM | POA: Diagnosis not present

## 2019-10-18 DIAGNOSIS — R0989 Other specified symptoms and signs involving the circulatory and respiratory systems: Secondary | ICD-10-CM | POA: Diagnosis not present

## 2019-10-18 DIAGNOSIS — R269 Unspecified abnormalities of gait and mobility: Secondary | ICD-10-CM | POA: Diagnosis present

## 2019-10-18 DIAGNOSIS — D62 Acute posthemorrhagic anemia: Secondary | ICD-10-CM | POA: Diagnosis present

## 2019-10-18 DIAGNOSIS — R5383 Other fatigue: Secondary | ICD-10-CM | POA: Diagnosis not present

## 2019-10-18 DIAGNOSIS — R4701 Aphasia: Secondary | ICD-10-CM | POA: Diagnosis present

## 2019-10-18 DIAGNOSIS — E039 Hypothyroidism, unspecified: Secondary | ICD-10-CM | POA: Diagnosis present

## 2019-10-18 DIAGNOSIS — M40209 Unspecified kyphosis, site unspecified: Secondary | ICD-10-CM | POA: Diagnosis present

## 2019-10-18 DIAGNOSIS — E876 Hypokalemia: Secondary | ICD-10-CM | POA: Diagnosis not present

## 2019-10-18 DIAGNOSIS — D509 Iron deficiency anemia, unspecified: Secondary | ICD-10-CM | POA: Diagnosis present

## 2019-10-18 DIAGNOSIS — R4189 Other symptoms and signs involving cognitive functions and awareness: Secondary | ICD-10-CM | POA: Diagnosis present

## 2019-10-18 DIAGNOSIS — Z515 Encounter for palliative care: Secondary | ICD-10-CM

## 2019-10-18 DIAGNOSIS — F0390 Unspecified dementia without behavioral disturbance: Secondary | ICD-10-CM | POA: Diagnosis present

## 2019-10-18 DIAGNOSIS — G252 Other specified forms of tremor: Secondary | ICD-10-CM | POA: Diagnosis present

## 2019-10-18 DIAGNOSIS — M810 Age-related osteoporosis without current pathological fracture: Secondary | ICD-10-CM | POA: Diagnosis present

## 2019-10-18 DIAGNOSIS — Z681 Body mass index (BMI) 19 or less, adult: Secondary | ICD-10-CM

## 2019-10-18 DIAGNOSIS — R9401 Abnormal electroencephalogram [EEG]: Secondary | ICD-10-CM | POA: Diagnosis not present

## 2019-10-18 DIAGNOSIS — G479 Sleep disorder, unspecified: Secondary | ICD-10-CM

## 2019-10-18 DIAGNOSIS — R05 Cough: Secondary | ICD-10-CM

## 2019-10-18 DIAGNOSIS — G0490 Encephalitis and encephalomyelitis, unspecified: Secondary | ICD-10-CM | POA: Diagnosis present

## 2019-10-18 DIAGNOSIS — Z79899 Other long term (current) drug therapy: Secondary | ICD-10-CM

## 2019-10-18 DIAGNOSIS — R739 Hyperglycemia, unspecified: Secondary | ICD-10-CM | POA: Diagnosis present

## 2019-10-18 DIAGNOSIS — B004 Herpesviral encephalitis: Secondary | ICD-10-CM | POA: Diagnosis present

## 2019-10-18 DIAGNOSIS — R059 Cough, unspecified: Secondary | ICD-10-CM

## 2019-10-18 DIAGNOSIS — G4700 Insomnia, unspecified: Secondary | ICD-10-CM | POA: Diagnosis present

## 2019-10-18 DIAGNOSIS — E46 Unspecified protein-calorie malnutrition: Secondary | ICD-10-CM | POA: Diagnosis present

## 2019-10-18 DIAGNOSIS — R1310 Dysphagia, unspecified: Secondary | ICD-10-CM

## 2019-10-18 DIAGNOSIS — E8809 Other disorders of plasma-protein metabolism, not elsewhere classified: Secondary | ICD-10-CM

## 2019-10-18 DIAGNOSIS — M25519 Pain in unspecified shoulder: Secondary | ICD-10-CM | POA: Diagnosis present

## 2019-10-18 DIAGNOSIS — Z20822 Contact with and (suspected) exposure to covid-19: Secondary | ICD-10-CM | POA: Diagnosis present

## 2019-10-18 DIAGNOSIS — K5900 Constipation, unspecified: Secondary | ICD-10-CM

## 2019-10-18 DIAGNOSIS — R531 Weakness: Secondary | ICD-10-CM | POA: Diagnosis present

## 2019-10-18 DIAGNOSIS — D539 Nutritional anemia, unspecified: Secondary | ICD-10-CM

## 2019-10-18 DIAGNOSIS — D580 Hereditary spherocytosis: Secondary | ICD-10-CM | POA: Diagnosis present

## 2019-10-18 DIAGNOSIS — Z79890 Hormone replacement therapy (postmenopausal): Secondary | ICD-10-CM

## 2019-10-18 DIAGNOSIS — R1312 Dysphagia, oropharyngeal phase: Secondary | ICD-10-CM | POA: Diagnosis present

## 2019-10-18 DIAGNOSIS — R159 Full incontinence of feces: Secondary | ICD-10-CM | POA: Diagnosis present

## 2019-10-18 DIAGNOSIS — I959 Hypotension, unspecified: Secondary | ICD-10-CM | POA: Diagnosis not present

## 2019-10-18 DIAGNOSIS — F0280 Dementia in other diseases classified elsewhere without behavioral disturbance: Secondary | ICD-10-CM | POA: Diagnosis present

## 2019-10-18 DIAGNOSIS — R54 Age-related physical debility: Secondary | ICD-10-CM | POA: Diagnosis present

## 2019-10-18 DIAGNOSIS — B1009 Other human herpesvirus encephalitis: Secondary | ICD-10-CM

## 2019-10-18 LAB — HSV DNA BY PCR (REFERENCE LAB)
HSV 1 DNA: POSITIVE
HSV 2 DNA: NEGATIVE

## 2019-10-18 MED ORDER — CHLORHEXIDINE GLUCONATE CLOTH 2 % EX PADS
6.0000 | MEDICATED_PAD | Freq: Every day | CUTANEOUS | Status: DC
  Administered 2019-10-18 – 2019-11-08 (×20): 6 via TOPICAL

## 2019-10-18 MED ORDER — LEVOTHYROXINE SODIUM 25 MCG PO TABS
125.0000 ug | ORAL_TABLET | Freq: Every day | ORAL | Status: DC
  Administered 2019-10-19 – 2019-11-01 (×14): 125 ug via ORAL
  Filled 2019-10-18 (×14): qty 1

## 2019-10-18 MED ORDER — DEXTROSE 5 % IV SOLN: 500.0000 mg | Freq: Two times a day (BID) | INTRAVENOUS | Status: DC

## 2019-10-18 MED ORDER — LEVETIRACETAM IN NACL 500 MG/100ML IV SOLN
500.0000 mg | Freq: Two times a day (BID) | INTRAVENOUS | Status: DC
  Administered 2019-10-18 – 2019-11-02 (×31): 500 mg via INTRAVENOUS
  Filled 2019-10-18 (×32): qty 100

## 2019-10-18 MED ORDER — DIPHENHYDRAMINE HCL 12.5 MG/5ML PO ELIX
12.5000 mg | ORAL_SOLUTION | Freq: Four times a day (QID) | ORAL | Status: DC | PRN
  Administered 2019-11-02: 25 mg via ORAL
  Filled 2019-10-18: qty 10

## 2019-10-18 MED ORDER — ENOXAPARIN SODIUM 40 MG/0.4ML ~~LOC~~ SOLN
40.0000 mg | SUBCUTANEOUS | Status: DC
  Administered 2019-10-18 – 2019-11-16 (×29): 40 mg via SUBCUTANEOUS
  Filled 2019-10-18 (×30): qty 0.4

## 2019-10-18 MED ORDER — SODIUM CHLORIDE 0.9% FLUSH
10.0000 mL | Freq: Two times a day (BID) | INTRAVENOUS | Status: DC
  Administered 2019-10-23 – 2019-10-26 (×5): 10 mL
  Administered 2019-10-26 – 2019-10-27 (×2): 20 mL
  Administered 2019-10-28 – 2019-10-30 (×3): 10 mL
  Administered 2019-10-31 – 2019-11-01 (×2): 20 mL
  Administered 2019-11-02 – 2019-11-08 (×7): 10 mL

## 2019-10-18 MED ORDER — DEXTROSE 5 % IV SOLN
500.0000 mg | Freq: Two times a day (BID) | INTRAVENOUS | Status: DC
  Administered 2019-10-18 – 2019-10-19 (×3): 500 mg via INTRAVENOUS
  Filled 2019-10-18 (×4): qty 10

## 2019-10-18 MED ORDER — FLEET ENEMA 7-19 GM/118ML RE ENEM
1.0000 | ENEMA | Freq: Once | RECTAL | Status: AC | PRN
  Administered 2019-11-01: 1 via RECTAL
  Filled 2019-10-18: qty 1

## 2019-10-18 MED ORDER — POLYETHYLENE GLYCOL 3350 17 G PO PACK
17.0000 g | PACK | Freq: Every day | ORAL | Status: DC | PRN
  Administered 2019-10-24 – 2019-10-31 (×3): 17 g via ORAL
  Filled 2019-10-18 (×4): qty 1

## 2019-10-18 MED ORDER — ACETAMINOPHEN 325 MG PO TABS
325.0000 mg | ORAL_TABLET | ORAL | Status: DC | PRN
  Administered 2019-10-22 – 2019-10-27 (×7): 650 mg via ORAL
  Filled 2019-10-18 (×9): qty 2

## 2019-10-18 MED ORDER — PROCHLORPERAZINE EDISYLATE 10 MG/2ML IJ SOLN: 5.0000 mg | Freq: Four times a day (QID) | INTRAMUSCULAR | Status: DC | PRN

## 2019-10-18 MED ORDER — LACTATED RINGERS IV BOLUS
500.0000 mL | Freq: Once | INTRAVENOUS | Status: AC
  Administered 2019-10-18: 500 mL via INTRAVENOUS

## 2019-10-18 MED ORDER — FLUTICASONE PROPIONATE 50 MCG/ACT NA SUSP
1.0000 | Freq: Every day | NASAL | Status: DC
  Administered 2019-10-19 – 2019-11-17 (×22): 1 via NASAL
  Filled 2019-10-18: qty 16

## 2019-10-18 MED ORDER — SENNOSIDES-DOCUSATE SODIUM 8.6-50 MG PO TABS: 1.0000 | ORAL_TABLET | Freq: Two times a day (BID) | ORAL | Status: DC | PRN

## 2019-10-18 MED ORDER — SODIUM CHLORIDE 0.9% FLUSH
10.0000 mL | INTRAVENOUS | Status: DC | PRN
  Administered 2019-10-26 – 2019-11-05 (×2): 10 mL

## 2019-10-18 MED ORDER — LEVETIRACETAM IN NACL 500 MG/100ML IV SOLN: 500.0000 mg | Freq: Two times a day (BID) | INTRAVENOUS | 0 refills | Status: DC

## 2019-10-18 MED ORDER — SODIUM CHLORIDE 0.9% FLUSH: 10.0000 mL | INTRAVENOUS | Status: DC | PRN

## 2019-10-18 MED ORDER — PROCHLORPERAZINE 25 MG RE SUPP: 12.5000 mg | Freq: Four times a day (QID) | RECTAL | Status: DC | PRN

## 2019-10-18 MED ORDER — BISACODYL 10 MG RE SUPP
10.0000 mg | Freq: Every day | RECTAL | Status: DC | PRN
  Administered 2019-10-25: 10 mg via RECTAL
  Filled 2019-10-18: qty 1

## 2019-10-18 MED ORDER — FOLIC ACID 1 MG PO TABS
1000.0000 ug | ORAL_TABLET | Freq: Every day | ORAL | Status: DC
  Administered 2019-10-19 – 2019-11-03 (×16): 1 mg via ORAL
  Filled 2019-10-18 (×16): qty 1

## 2019-10-18 MED ORDER — CALCIUM CARBONATE-VITAMIN D 500-200 MG-UNIT PO TABS
1.0000 | ORAL_TABLET | Freq: Two times a day (BID) | ORAL | Status: DC
  Administered 2019-10-18 – 2019-11-01 (×26): 1 via ORAL
  Filled 2019-10-18 (×28): qty 1

## 2019-10-18 MED ORDER — SENNOSIDES-DOCUSATE SODIUM 8.6-50 MG PO TABS
1.0000 | ORAL_TABLET | Freq: Every day | ORAL | Status: DC
  Administered 2019-10-18 – 2019-10-30 (×13): 1 via ORAL
  Filled 2019-10-18 (×14): qty 1

## 2019-10-18 MED ORDER — GUAIFENESIN-DM 100-10 MG/5ML PO SYRP
5.0000 mL | ORAL_SOLUTION | Freq: Four times a day (QID) | ORAL | Status: DC | PRN
  Administered 2019-10-31 (×2): 10 mL via ORAL
  Filled 2019-10-18 (×2): qty 10

## 2019-10-18 MED ORDER — CHLORHEXIDINE GLUCONATE CLOTH 2 % EX PADS: 6.0000 | MEDICATED_PAD | Freq: Every day | CUTANEOUS | Status: DC

## 2019-10-18 MED ORDER — ALUM & MAG HYDROXIDE-SIMETH 200-200-20 MG/5ML PO SUSP: 30.0000 mL | ORAL | Status: DC | PRN

## 2019-10-18 MED ORDER — PROCHLORPERAZINE MALEATE 5 MG PO TABS: 5.0000 mg | ORAL_TABLET | Freq: Four times a day (QID) | ORAL | Status: DC | PRN

## 2019-10-18 NOTE — H&P (Signed)
Physical Medicine and Rehabilitation Admission H&P    Chief Complaint  Patient presents with  . 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 Seizures    HPI:  Eric Larsen is an 86 year old male with history of osteoporosis, gait disorder, heriditary spherocytosis, recent diagnosis of dementia who was admitted on 10/14/2019 with intermittent confusion and progressive weakness x1 week.  History taken from wife due to cognition.  Wife also reports 6 month history of problems with memory and weight loss. He was found to be febrile with temp of 102.6 and MRI brain done showing left anterior medial temporal lobe thickening and T2 hyperintensity gyri with edema and small nonmasslike enhancement left anterior temporal lobe.  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  Empiric IV acyclovir was initiated.  TSH- 2.289 and levothyroxine increased to 125 mcg/day. EEG done showing sharp waves in left anterior temporal lesion with mild to moderate diffuse encephalopathy.  He was loaded with Keppra but continued to have issues with confusion and bouts of lethargy.  Hospital course further complicated by dysphagia due to tendency to hold orals and therefore he was placed on a dysphagia 1, thin liquid diet.  LT-EEG was negative for seizures but did show epileptogenicity and is to continue Keppra.  Blood cultures x2 done in 1+ for staph felt to be contaminant. LP was ordered for work-up and showed elevated protein-139 and lymphocytosis with WBC 84.  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  Patient with resultant cognitive deficits with unintelligible language, difficulty following commands, and significant weakness requiring STEDY to stand.  CIR recommended due to functional decline.  Please see preadmission assessment from earlier today as well.  Review of Systems  Unable to perform ROS: Mental acuity   Past Medical History:  Diagnosis  Date  . Hypothyroidism   . Osteoporosis   . Thyroid disease     Past Surgical History:  Procedure Laterality Date  . CHOLECYSTECTOMY    . HIP FRACTURE SURGERY    . INTRAMEDULLARY (IM) NAIL INTERTROCHANTERIC Right 08/13/2017   Procedure: INTRAMEDULLARY (IM) NAIL INTERTROCHANTRIC;  Surgeon: Earnestine Leys, MD;  Location: [facility] ORS;  Service: Orthopedics;  Laterality: Right;  . SHOULDER SURGERY      Family History  Problem Relation Age of Onset  . Hereditary spherocytosis Father   . Hereditary spherocytosis Sister     Social History:  Lives with wife (married X 17 years). Daughters live out of town. Retired Tax Forensic psychologist. He was independent with cane and held on to walls when walking. He smoked for 30 years quit > 20 years ago?  He has never used smokeless tobacco. He has a mixed drink at nights and drinks beer occasionally. Per reports he does not use drugs.   Allergies: No Known Allergies    Medications Prior to Admission  Medication Sig Dispense Refill  . acetaminophen (TYLENOL) 325 MG tablet Take 2 tablets (650 mg total) by mouth every 6 (six) hours as needed for mild pain or headache (or Fever >/= 101).    . calcium-vitamin D (OSCAL WITH D) 500-200 MG-UNIT TABS tablet Take 1 tablet by mouth 2 (two) times daily with a meal.    . Cholecalciferol 25 MCG (1000 UT) CHEW Chew 2,000 Units by mouth daily.    Marland Kitchen donepezil (ARICEPT) 5 MG tablet Take 1 tablet by mouth at bedtime.    . folic acid (FOLVITE) [id number] MCG tablet Take 400 mcg by mouth  daily.    . folic acid (FOLVITE) Q000111Q MCG tablet Take 800 mcg by mouth daily.    . Multiple Vitamin (MULTIVITAMIN) capsule Take 1 capsule by mouth daily.    . Multiple Vitamins-Minerals (PRESERVISION AREDS PO) Take 1 tablet by mouth every evening.    . Niacin (VITAMIN B-3 PO) Take 1 tablet by mouth daily.    Marland Kitchen SYNTHROID 125 MCG tablet Take 125 mcg by mouth daily.    Marland Kitchen enoxaparin (LOVENOX) 40 MG/0.4ML injection Inject 0.4 mLs (40 mg total) into the skin  daily for 14 days. (Patient not taking: Reported on 10/14/2019) 14 Syringe 0  . ferrous sulfate 325 (65 FE) MG tablet Take 1 tablet (325 mg total) by mouth daily with breakfast. (Patient not taking: Reported on 09/07/2017) 30 tablet 0  . HYDROcodone-acetaminophen (NORCO/VICODIN) 5-325 MG tablet Take 1 tablet by mouth every 4 (four) hours as needed for moderate pain or severe pain. (Patient not taking: Reported on 09/07/2017) 30 tablet 0  . metoprolol tartrate (LOPRESSOR) 25 MG tablet Take 0.5 tablets (12.5 mg total) by mouth 2 (two) times daily. (Patient not taking: Reported on 09/07/2017) 15 tablet 0  . polyethylene glycol (MIRALAX / GLYCOLAX) packet Take 17 g by mouth daily. (Patient not taking: Reported on 09/07/2017) 30 each 0  . senna (SENOKOT) 8.6 MG TABS tablet Take 1 tablet (8.6 mg total) by mouth 2 (two) times daily. (Patient not taking: Reported on 09/07/2017) 60 each 0    Drug Regimen Review  Drug regimen was reviewed and remains appropriate with no significant issues identified  Home: Home Living Family/patient expects to be discharged to:: Private residence Living Arrangements: Spouse/significant other Available Help at Discharge: Family, Available 24 hours/day Type of Home: House Home Access: Stairs to enter [employer] of Steps: 4 Entrance Stairs-Rails: Right, Left, Can reach both Home Layout: Two level, Able to live on main level with bedroom/bathroom Alternate Level Stairs-Number of Steps: Flight Alternate Level Stairs-Rails: Right Bathroom Shower/Tub: Multimedia programmer: Standard Home Equipment: Civil engineer, contracting, Grab bars - tub/shower, Environmental consultant - 4 wheels   Functional History: Prior Function Level of Independence: Needs assistance Gait / Transfers Assistance Needed: Walks independently; assist needed recently to go up and down the flight of stairs to reach bedroom ADL's / Homemaking Assistance Needed: was independent with ADLs/selfcare, toileting, self  feeding Comments: Had gone up and down the flight of steps in his home as recently as 2/26  Functional Status:  Mobility: Bed Mobility Overal bed mobility: Needs Assistance Bed Mobility: Rolling, Supine to Sit, Sit to Supine Rolling: Max assist Sidelying to sit: Max assist Supine to sit: Max assist Sit to supine: Total assist, +2 for physical assistance General bed mobility comments: increased tmie and effort, used rail and PT as an anchor to scoot hips forward at EOB Transfers Overall transfer level: Needs assistance Equipment used: 2 person hand held assist, Ambulation equipment used Transfer via Lift Equipment: Stedy Transfers: Sit to/from [employer] to Stand: Min assist, Max assist, +2 physical assistance General transfer comment: min A +2 sit to stand from bed, max A +2 from [place] seat (pt fatigued) Ambulation/Gait Assistive device: 2 person hand held assist General Gait Details: deferred- fatigue after sitting on stedy seats for 3-4 minutes    ADL: ADL Overall ADL's : Needs assistance/impaired Eating/Feeding: Set up, Supervision/ safety, Sitting, Bed level Grooming: Wash/dry hands, Wash/dry face, Min guard, Sitting Upper Body Bathing: Minimal assistance Lower Body Bathing: Maximal assistance Upper Body Dressing : Minimal assistance Lower  Body Dressing: Total assistance Toilet Transfer: Minimal assistance, Maximal assistance, +2 for physical assistance Toilet Transfer Details (indicate cue type and reason): sit - stand from EOB min A +2, max A +2 from [place]- Clothing Manipulation and Hygiene: Total assistance, Bed level Functional mobility during ADLs: Minimal assistance, Maximal assistance, +2 for physical assistance  Cognition: Cognition Overall Cognitive Status: Impaired/Different from baseline Orientation Level: Oriented to person, Disoriented to place, Disoriented to time, Disoriented to situation Cognition Arousal/Alertness: Awake/alert Behavior  During Therapy: WFL for tasks assessed/performed Overall Cognitive Status: Impaired/Different from baseline Area of Impairment: Orientation, Attention, Following commands, Safety/judgement, Awareness, Problem solving Orientation Level: Disoriented to, Place, Time, Situation Current Attention Level: Focused Following Commands: Follows one step commands with increased time Safety/Judgement: Decreased awareness of safety Awareness: Intellectual Problem Solving: Slow processing, Decreased initiation, Difficulty sequencing, Requires verbal cues, Requires tactile cues General Comments: Made attempts at answering questions, however verbalizations were largely unintelligible, difficulty with word finding   Blood pressure 92/77, pulse 83, temperature 97.6 F (36.4 C), temperature source Oral, resp. rate 17, height 5\' 4"  (1.626 m), weight 53.5 kg, SpO2 97 %. Physical Exam  Vitals reviewed. Constitutional: He appears well-developed.  Frail  HENT:  Head: Normocephalic and atraumatic.  Eyes: EOM are normal. Right eye exhibits no discharge. Left eye exhibits no discharge.  Neck: No tracheal deviation present. No thyromegaly present.  Respiratory: Effort normal. No respiratory distress.  GI: Soft. He exhibits no distension.  Musculoskeletal:     Comments: No edema or tenderness in extremities  Neurological: He is alert.  Oriented to person name only, is able to recognize wife, but unable to identify her name. Motor: Grossly 4+/5 throughout  Skin: Skin is warm and dry.  Psychiatric: His speech is delayed. He is slowed. Cognition and memory are impaired.    Results for orders placed or performed during the hospital encounter of 10/14/19 (from the past 48 hour(s))  Protein and glucose, CSF     Status: Abnormal   Collection Time: 10/16/19  3:22 PM  Result Value Ref Range   Glucose, CSF 56 40 - 70 mg/dL   Total  Protein, CSF 139 (H) 15 - 45 mg/dL    Comment: Performed at [facility] [address], [place], [place] [address]  CSF cell count with differential     Status: Abnormal   Collection Time: 10/16/19  3:22 PM  Result Value Ref Range   Tube # 3    Color, CSF COLORLESS COLORLESS   Appearance, CSF CLEAR CLEAR   Supernatant NOT INDICATED    RBC Count, CSF 6 (H) 0 /cu mm   WBC, CSF 84 (HH) 0 - 5 /cu mm    Comment: CRITICAL RESULT CALLED TO, READ BACK BY AND VERIFIED WITH: C YAP RN [id number] K FORSYTH    Segmented Neutrophils-CSF 0 0 - 6 %   Lymphs, CSF 87 (H) 40 - 80 %   Monocyte-Macrophage-Spinal Fluid 13 (L) 15 - 45 %   Eosinophils, CSF 0 0 - 1 %    Comment: Performed at [facility] [address], [place], [place] [address]  Pathologist smear review     Status: None   Collection Time: 10/16/19  3:22 PM  Result Value Ref Range   Path Review Lymphocytosis.     Comment: Reviewed by Chrystie Nose. Saralyn Pilar, M.D. 10/17/2019. Performed at [facility], [place] [address], [place], [place] [address]   CSF culture with Stat gram  stain     Status: None (Preliminary result)   Collection Time: 10/16/19  3:23 PM   Specimen: CSF; Cerebrospinal Fluid  Result Value Ref Range   Specimen Description CSF    Special Requests Normal    Gram Stain      WBC PRESENT, PREDOMINANTLY MONONUCLEAR NO ORGANISMS SEEN CYTOSPIN SMEAR    Culture      NO GROWTH 2 DAYS Performed at [facility] [address] [place], [place] [address]    Report Status PENDING   Herpes simplex virus (HSV), DNA by PCR Cerebrospinal Fluid     Status: None   Collection Time: 10/16/19  3:23 PM  Result Value Ref Range   Source of Sample CSF    HSV 1 DNA Positive Negative    Comment: CRITICAL RESULT CALLED TO, READ BACK BY AND VERIFIED WITH: NORMAN CHAVIS,RN AT [id number] 10/18/2019 BY ZBEECH. Performed at [facility], [place] [address] [place], [place] [address]    HSV 2 DNA Negative Negative    Comment: (NOTE) This test was developed and its performance characteristics determined by  [employer]. It has not been cleared or approved by the U.S. Food and Drug Administration. The FDA has determined that such clearance or approval is not necessary. This test is used for clinical purposes. It should not be regarded as investigational or research. Called to [facility] on 10/17/2019 at 23:49 ET for test(s) HSV-1 DNA Performed At: [facility] [place], [place] [id number] Rush Farmer MD [id number]   CBC     Status: Abnormal   Collection Time: 10/17/19  5:06 AM  Result Value Ref Range   WBC 9.3 4.0 - 10.5 K/uL   RBC 3.88 (L) 4.22 - 5.81 MIL/uL   Hemoglobin 13.3 13.0 - 17.0 g/dL   HCT 38.6 (L) 39.0 - 52.0 %   MCV 99.5 80.0 - 100.0 fL   MCH 34.3 (H) 26.0 - 34.0 pg   MCHC 34.5 30.0 - 36.0 g/dL   RDW 19.6 (H) 11.5 - 15.5 %   Platelets 231 150 - 400 K/uL   nRBC 0.0 0.0 - 0.2 %    Comment: Performed at [facility], [place] [address] [place], [place] [id number]  Basic metabolic panel     Status: Abnormal   Collection Time: 10/17/19  5:06 AM  Result Value Ref Range   Sodium 138 135 - 145 mmol/L   Potassium 3.8 3.5 - 5.1 mmol/L   Chloride 105 98 - 111 mmol/L   CO2 26 22 - 32 mmol/L   Glucose, Bld 117 (H) 70 - 99 mg/dL    Comment: Glucose reference range applies only to samples taken after fasting for at least 8 hours.   BUN 20 8 - 23 mg/dL   Creatinine, Ser 0.89 0.61 - 1.24 mg/dL   Calcium 8.7 (L) 8.9 - 10.3 mg/dL   GFR calc non Af Amer >60 >60 mL/min   GFR calc Af Amer >60 >60 mL/min   Anion gap 7 5 - 15    Comment: Performed at [facility] [address] [place], [place] [address]   [place] EKG SITE RITE  Result Date: 10/18/2019 If [facility] image not attached, placement could not be confirmed due to current cardiac rhythm.      Medical Problem List and Plan: 1.  Cognitive deficits with unintelligible language, weakness, secondary to herpes encephalitis.  -patient may shower  -ELOS/Goals: 9-14  days/Supervision.  Admit  to CIR  Cont IV acyclovir 2.  Antithrombotics: -DVT/anticoagulation:  Pharmaceutical: Lovenox  -antiplatelet therapy: NA 3. Pain Management: PRN medications 4. Mood: Team supporst  -antipsychotic agents: NA 5. Neuropsych: This patient is not capable of making decisions on his own behalf. 6. Skin/Wound Care: Routine pressure relief measures.  7. Fluids/Electrolytes/Nutrition: Monitor I/O. CMP ordered. 8. Hypothyroid: Now on increased supplement since 3/01 to 126mcg. 9. Epileptogenic potential: Continue Keppra 500 mg bid for seizure 10. Hypokalemia: has resolved with supplementation. CMP ordered for tomorrow AM. 11. Macrocytic anemia: CBC ordered for tomorrow  Bary Leriche, PA-C 10/18/2019  I have personally performed a face to face diagnostic evaluation, including, but not limited to relevant history and physical exam findings, of this patient and developed relevant assessment and plan.  Additionally, I have reviewed and concur with the physician assistant's documentation above.  Delice Lesch, MD, ABPMR  The patient's status has not changed. The original post admission physician evaluation remains appropriate, and any changes from the pre-admission screening or documentation from the acute chart are noted above.   Delice Lesch, MD, ABPMR

## 2019-10-18 NOTE — Progress Notes (Signed)
Physical Therapy Treatment Patient Details Name: Eric Larsen MRN: [id number] DOB: [birth year] Today's Date: 10/18/2019    History of Present Illness Mr. Tab Larsen is an  86 y.o. yo male w/ PMH significant for hypothyroidism, hyperlipidemia, hereditary spherocytosis and recent diagnosis of dementia presenting with one week of progressively worsening confusion.  MRI brain on 2/28 reported: "Left temporal lobe is abnormal with increased signal on T2 and FLAIR and cortical thickening and medially and anteriorly. There is a small area of non masslike enhancement in the left anterior temporal lobe. The patient was febrile on admission. 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 Infiltrating tumor also in the differential."     PT Comments    Patient received in bed, cognition basically the same as last session with attempts to verbalize but only really stating simple one word answers sometimes not related to the question posed to him. See below for mobility/assist levels. Tolerated much more mobility today, had to stand 6 times due to having multiple BMs in sitting/standing and for pericare- still needed modAx2 in and out of stedy for rising to upright and maintaining balance but able to tolerate each standing period for approximately 2 minutes (3 at best). Very fatigued at EOS. He was left up in the chair with all needs met, spouse present and nursing staff aware of technique for return to bed later in day. Continue to recommend CIR.     Follow Up Recommendations  CIR     Equipment Recommendations  Rolling walker with 5" wheels;3in1 (PT)    Recommendations for Other Services       Precautions / Restrictions Precautions Precautions: Fall Restrictions Weight Bearing Restrictions: No    Mobility  Bed Mobility Overal bed mobility: Needs Assistance Bed Mobility: Supine to Sit     Supine to sit: Max assist     General bed mobility comments: increased time and  effort, used rail and PT as an anchor to initiate scooting hips forward but ultimately needed MaxA to assist in rotating to EOB with chuck  Transfers Overall transfer level: Needs assistance Equipment used: 2 person hand held assist;Ambulation equipment used Transfers: Sit to/from Stand Sit to Stand: Mod assist;+2 physical assistance         General transfer comment: performed 6 stands with ModAx2 in and out of stedy today (had multiple BMs in sitting/standing and needed to stand multiple times for RN to provide pericare. Able to tolerate each standing period for approximately 2-3 minute at most.  Ambulation/Gait             General Gait Details: unable   Stairs             Wheelchair Mobility    Modified Rankin (Stroke Patients Only)       Balance Overall balance assessment: Needs assistance Sitting-balance support: Feet supported Sitting balance-[name]: Poor   Postural control: Posterior lean Standing balance support: Bilateral upper extremity supported Standing balance-[name]: Poor                              Cognition Arousal/Alertness: Awake/alert Behavior During Therapy: WFL for tasks assessed/performed Overall Cognitive Status: Impaired/Different from baseline Area of Impairment: Orientation;Attention;Following commands;Safety/judgement;Awareness;Problem solving                 Orientation Level: Disoriented to;Place;Time;Situation Current Attention Level: Focused   Following Commands: Follows one step commands with increased time Safety/Judgement: Decreased awareness of safety Awareness: Intellectual  Problem Solving: Slow processing;Decreased initiation;Difficulty sequencing;Requires verbal cues;Requires tactile cues General Comments: attempted to answer questions but still difficult to understand and when he did answer sometimes it was not related to the questions; perseverating more on lines/leads more today, Max  cues and Mod TC for correct sequencing      Exercises      General Comments        Pertinent Vitals/Pain Pain Assessment: Faces Faces Pain Scale: Hurts a little bit Pain Location: generalized Pain Descriptors / Indicators: Grimacing Pain Intervention(s): Limited activity within patient's tolerance;Monitored during session    Home Living                      Prior Function            PT Goals (current goals can now be found in the care plan section) Acute Rehab PT Goals Patient Stated Goal: Unable to state PT Goal Formulation: With family Time For Goal Achievement: 10/29/19 Potential to Achieve Goals: Good Progress towards PT goals: Progressing toward goals    Frequency    Min 3X/week      PT Plan Current plan remains appropriate    Co-evaluation              AM-PAC PT "6 Clicks" Mobility   Outcome Measure  Help needed turning from your back to your side while in a flat bed without using bedrails?: A Lot Help needed moving from lying on your back to sitting on the side of a flat bed without using bedrails?: A Lot Help needed moving to and from a bed to a chair (including a wheelchair)?: A Lot Help needed standing up from a chair using your arms (e.g., wheelchair or bedside chair)?: A Lot Help needed to walk in hospital room?: Total Help needed climbing 3-5 steps with a railing? : Total 6 Click Score: 10    End of Session   Activity Tolerance: Patient tolerated treatment well;Patient limited by fatigue Patient left: in chair;with call bell/phone within reach;with chair alarm set Nurse Communication: Mobility status;Need for lift equipment PT Visit Diagnosis: Unsteadiness on feet (R26.81);Other abnormalities of gait and mobility (R26.89);Other symptoms and signs involving the nervous system (R29.898)     Time: 3220-2542 PT Time Calculation (min) (ACUTE ONLY): 38 min  Charges:  $Therapeutic Activity: 38-52 mins                      Windell Norfolk, DPT, PN1   Supplemental Physical Therapist [place]    Pager [phone] Acute Rehab Office [phone]

## 2019-10-18 NOTE — Progress Notes (Signed)
PMR Admission Coordinator Pre-Admission Assessment  Patient: Eric Larsen is an 86 y.o., male MRN: [id number] DOB: 03/30/1931 Height: 5' 4" (162.6 cm) Weight: 53.5 kg  Insurance Information HMO:     PPO:      PCP:      IPA:      80/20:      OTHER:  PRIMARY: Medicare A and B      Policy#: [id number]      Subscriber: pt CM Name:       Phone#:      Fax#:  Pre-Cert#: verified online      Employer:  Benefits:  Phone #:      Name:  Eff. Date: A 05/17/98, B 01/16/99     Deduct: $1484      Out of Pocket Max: n/a      Life Max: n/a CIR: 100%      SNF: 20 full days Outpatient: 80%     Co-Pay: 20% Home Health: 100%      Co-Pay:  DME: 80%     Co-Pay: 20% Providers:  SECONDARY:       Policy#:       Subscriber:  CM Name:       Phone#:      Fax#:  Pre-Cert#:       Employer:  Benefits:  Phone #:      Name:  Eff. Date:      Deduct:       Out of Pocket Max:       Life Max:  CIR:       SNF:  Outpatient:      Co-Pay:  Home Health:       Co-Pay:  DME:      Co-Pay:   Medicaid Application Date:       Case Manager:  Disability Application Date:       Case Worker:   The "Data Collection Information Summary" for patients in Inpatient Rehabilitation Facilities with attached "Privacy Act Statement-Health Care Records" was provided and verbally reviewed with: Patient and Family  Emergency Contact Information Contact Information    Name Relation Home Work Mobile   Resendes,[name] Lee Spouse [phone]  [phone]      Current Medical History  Patient Admitting Diagnosis: encephalopathy  History of Present Illness: Pt is an 86 y/o male admitted to [facility] on 10/14/2019 with a one week history of progressive confusion, urinary incontinence, and generalized weakness.  PMH significant for spherocytosis and dementia, hypothyroidism.  Pt febrile on admission (102.6), CT of head/neck and chest xray without acute intracranial findings or active infection.  MRI on 2/28 showed increased T2 signal in  left anterior and medial temporal lobe consistent with herpes encephalitis.  EEG showed epileptogenicity and cortical dysfunction in left anterior temporal region.  LP showed elevated protein (139), WBC (84), and lymphocytes (87), again consistent with HSV encephalitis.  Pt with ongoing confusion.  Recommendation for 2-3 week course of acyclovir, and keppra.  Therapy evaluations were completed and pt was recommended for CIR.      Patient's medical record from [facility] has been reviewed by the rehabilitation admission coordinator and physician.  Past Medical History  Past Medical History:  Diagnosis Date  . Hypothyroidism   . Osteoporosis   . Thyroid disease     Family History   family history includes Hereditary spherocytosis in his father and sister.  Prior Rehab/Hospitalizations Has the patient had prior rehab or hospitalizations prior to admission? No  Has the patient   had major surgery during 100 days prior to admission? No   Current Medications  Current Facility-Administered Medications:  .  0.9 %  sodium chloride infusion, , Intravenous, PRN, Narendra, Nischal, MD, Last Rate: 10 mL/hr at 10/15/19 0616, 250 mL at 10/15/19 0616 .  0.9 %  sodium chloride infusion, , Intravenous, Continuous, Seawell, Jaimie A, DO, Last Rate: 75 mL/hr at 10/17/19 1436, New Bag at 10/17/19 1436 .  acetaminophen (TYLENOL) tablet 650 mg, 650 mg, Oral, Q6H PRN, 650 mg at 10/18/19 0526 **OR** acetaminophen (TYLENOL) suppository 650 mg, 650 mg, Rectal, Q6H PRN, Krienke, Marissa M, MD .  acyclovir (ZOVIRAX) 500 mg in dextrose 5 % 100 mL IVPB, 500 mg, Intravenous, Q12H, Ledford, James L, RPH, Last Rate: 110 mL/hr at 10/18/19 0928, 500 mg at 10/18/19 0928 .  calcium-vitamin D (OSCAL WITH D) 500-200 MG-UNIT per tablet 1 tablet, 1 tablet, Oral, BID WC, Seawell, Jaimie A, DO, 1 tablet at 10/18/19 0816 .  folic acid (FOLVITE) tablet 1 mg, 1,000 mcg, Oral, Daily, Seawell, Jaimie A, DO, 1 mg at 10/18/19  0816 .  lactated ringers bolus 500 mL, 500 mL, Intravenous, Once, Seawell, Jaimie A, DO .  levETIRAcetam (KEPPRA) IVPB 500 mg/100 mL premix, 500 mg, Intravenous, Q12H, Arora, Ashish, MD, Last Rate: 400 mL/hr at 10/18/19 0819, 500 mg at 10/18/19 0819 .  levothyroxine (SYNTHROID) tablet 125 mcg, 125 mcg, Oral, QAC breakfast, Aslam, Sadia, MD, 125 mcg at 10/18/19 0526 .  lidocaine (XYLOCAINE) 1 % (with pres) injection 10 mL, 10 mL, Infiltration, Once, Aroor, Sushanth R, MD .  polyethylene glycol (MIRALAX / GLYCOLAX) packet 17 g, 17 g, Oral, Daily PRN, Seawell, Jaimie A, DO .  senna-docusate (Senokot-S) tablet 1 tablet, 1 tablet, Oral, BID PRN, Seawell, Jaimie A, DO  Patients Current Diet:  Diet Order            DIET - DYS 1 Room service appropriate? No; Fluid consistency: Thin  Diet effective now              Precautions / Restrictions Precautions Precautions: Fall Restrictions Weight Bearing Restrictions: No   Has the patient had 2 or more falls or a fall with injury in the past year? No  Prior Activity Level Community (5-7x/wk): was active with his wife, retired, occasional use of SPC prior to admission, was getting ready to start HHPT for strengthening   Prior Functional Level Self Care: Did the patient need help bathing, dressing, using the toilet or eating? Independent  Indoor Mobility: Did the patient need assistance with walking from room to room (with or without device)? Independent  Stairs: Did the patient need assistance with internal or external stairs (with or without device)? Needed some help  Functional Cognition: Did the patient need help planning regular tasks such as shopping or remembering to take medications? Needed some help  Home Assistive Devices / Equipment Home Assistive Devices/Equipment: Cane (specify quad or straight), Walker (specify type) Home Equipment: Shower seat, Grab bars - tub/shower, Walker - 4 wheels  Prior Device Use: Indicate devices/aids  used by the patient prior to current illness, exacerbation or injury? occasional SPC, immediately PTA using RW  Current Functional Level Cognition  Overall Cognitive Status: Impaired/Different from baseline Current Attention Level: Focused Orientation Level: Oriented to person, Disoriented to place, Disoriented to time, Disoriented to situation Following Commands: Follows one step commands with increased time Safety/Judgement: Decreased awareness of safety General Comments: Made attempts at answering questions, however verbalizations were largely unintelligible, difficulty with word finding      Extremity Assessment (includes Sensation/Coordination)  Upper Extremity Assessment: Generalized weakness  Lower Extremity Assessment: Defer to PT evaluation    ADLs  Overall ADL's : Needs assistance/impaired Eating/Feeding: Set up, Supervision/ safety, Sitting, Bed level Grooming: Wash/dry hands, Wash/dry face, Min guard, Sitting Upper Body Bathing: Minimal assistance Lower Body Bathing: Maximal assistance Upper Body Dressing : Minimal assistance Lower Body Dressing: Total assistance Toilet Transfer: Minimal assistance, Maximal assistance, +2 for physical assistance Toilet Transfer Details (indicate cue type and reason): sit - stand from EOB min A +2, max A +2 from Stedy seat Toileting- Clothing Manipulation and Hygiene: Total assistance, Bed level Functional mobility during ADLs: Minimal assistance, Maximal assistance, +2 for physical assistance    Mobility  Overal bed mobility: Needs Assistance Bed Mobility: Rolling, Supine to Sit, Sit to Supine Rolling: Max assist Sidelying to sit: Max assist Supine to sit: Max assist Sit to supine: Total assist, +2 for physical assistance General bed mobility comments: increased tmie and effort, used rail and PT as an anchor to scoot hips forward at EOB    Transfers  Overall transfer level: Needs assistance Equipment used: 2 person hand held assist,  Ambulation equipment used Transfer via Lift Equipment: Stedy Transfers: Sit to/from Stand Sit to Stand: Min assist, Max assist, +2 physical assistance General transfer comment: min A +2 sit to stand from bed, max A +2 from Stedy seat (pt fatigued)    Ambulation / Gait / Stairs / Wheelchair Mobility  Ambulation/Gait Assistive device: 2 person hand held assist General Gait Details: deferred- fatigue after sitting on stedy seats for 3-4 minutes    Posture / Balance Balance Overall balance assessment: Needs assistance Sitting-balance support: Feet supported Sitting balance-[name]: Poor Postural control: Posterior lean Standing balance support: Bilateral upper extremity supported Standing balance-[name]: Poor    Special needs/care consideration BiPAP/CPAP no CPM no Continuous Drip IV no Dialysis no        Days n/a Life Vest no Oxygen no Special Bed no Trach Size no Wound Vac (area) no      Location n/a Skin intact                              Location  Bowel mgmt: incontinent Bladder mgmt: incontinent Diabetic mgmt: no Behavioral consideration yes, sundowns Chemo/radiation no   Previous Home Environment (from acute therapy documentation) Living Arrangements: Spouse/significant other Available Help at Discharge: Family, Available 24 hours/day Type of Home: House Home Layout: Two level, Able to live on main level with bedroom/bathroom Alternate Level Stairs-Rails: Right Alternate Level Stairs-Number of Steps: Flight Home Access: Stairs to enter Entrance Stairs-Rails: Right, Left, Can reach both Entrance Stairs-Number of Steps: 4 Bathroom Shower/Tub: Walk-in shower Bathroom Toilet: Standard Home Care Services: No  Discharge Living Setting Plans for Discharge Living Setting: Patient's home Type of Home at Discharge: House Discharge Home Layout: Two level, Able to live on main level with bedroom/bathroom Alternate Level Stairs-Rails: Right Alternate Level  Stairs-Number of Steps: 14 Discharge Home Access: Stairs to enter Entrance Stairs-Rails: Can reach both Entrance Stairs-Number of Steps: 4 Discharge Bathroom Shower/Tub: Walk-in shower Discharge Bathroom Toilet: Standard Discharge Bathroom Accessibility: Yes How Accessible: Accessible via walker Does the patient have any problems obtaining your medications?: No  Social/Family/Support Systems Patient Roles: Spouse Anticipated Caregiver: spouse, [name]  Anticipated Caregiver's Contact Information: [phone] Ability/Limitations of Caregiver: supervision to min assist Caregiver Availability: 24/7 Discharge Plan Discussed with Primary Caregiver: Yes Is Caregiver In Agreement with   Plan?: Yes Does Caregiver/Family have Issues with Lodging/Transportation while Pt is in Rehab?: No  Goals/Additional Needs Patient/Family Goal for Rehab: PT/OT supervision, SLP min assist Expected length of stay: 21-24 days Dietary Needs: dys 1/thin Special Service Needs: IV abx 2-3 weeks Pt/Family Agrees to Admission and willing to participate: Yes Program Orientation Provided & Reviewed with Pt/Caregiver Including Roles  & Responsibilities: Yes  Decrease burden of Care through IP rehab admission: n/a  Possible need for SNF placement upon discharge: not anticipated  Patient Condition: I have reviewed medical records from [facility], spoken with CSW, and patient and spouse. I met with patient at the bedside for inpatient rehabilitation assessment.  Patient will benefit from ongoing PT, OT and SLP, can actively participate in 3 hours of therapy a day 5 days of the week, and can make measurable gains during the admission.  Patient will also benefit from the coordinated team approach during an Inpatient Acute Rehabilitation admission.  The patient will receive intensive therapy as well as Rehabilitation physician, nursing, social worker, and care management interventions.  Due to bladder management,  bowel management, safety, skin/wound care, disease management, medication administration, pain management and patient education the patient requires 24 hour a day rehabilitation nursing.  The patient is currently max +2 to total +2 with mobility and basic ADLs.  Discharge setting and therapy post discharge at home is anticipated.  Patient has agreed to participate in the Acute Inpatient Rehabilitation Program and will admit today.  Preadmission Screen Completed By:  Cas Tracz E Tarika Mckethan, PT, DPT 10/18/2019 10:56 AM ______________________________________________________________________   Discussed status with Dr. Patel on 10/18/19  at 10:56 AM  and received approval for admission today.  Admission Coordinator:  Katelynne Revak E Haliyah Fryman, PT, DPT time 10:56 AM /Date 10/18/19    Assessment/Plan: Diagnosis: Herpes encephalitis 1. Does the need for close, 24 hr/day Medical supervision in concert with the patient's rehab needs make it unreasonable for this patient to be served in a less intensive setting? Yes 2. Co-Morbidities requiring supervision/potential complications: Thyroid disease, osteoporosis 3. Due to bladder management, bowel management, safety, skin/wound care, disease management, medication administration, pain management and patient education, does the patient require 24 hr/day rehab nursing? Yes 4. Does the patient require coordinated care of a physician, rehab nurse, PT, OT, and SLP to address physical and functional deficits in the context of the above medical diagnosis(es)? Yes Addressing deficits in the following areas: balance, endurance, locomotion, strength, transferring, bowel/bladder control, bathing, dressing, toileting, cognition, language, swallowing and psychosocial support 5. Can the patient actively participate in an intensive therapy program of at least 3 hrs of therapy 5 days a week? Yes 6. The potential for patient to make measurable gains while on inpatient rehab is  excellent 7. Anticipated functional outcomes upon discharge from inpatient rehab: modified independent and supervision PT, modified independent and supervision OT, modified independent and supervision SLP 8. Estimated rehab length of stay to reach the above functional goals is: 9-14 days. 9. Anticipated discharge destination: Home 10. Overall Rehab/Functional Prognosis: excellent   MD Signature: Ankit Patel, MD, ABPMR   above functional goals is: 9-14 days. 9. Anticipated discharge destination: Home 10. Overall Rehab/Functional Prognosis: excellent     MD Signature: Delice Lesch, MD, ABPMR

## 2019-10-18 NOTE — Progress Notes (Signed)
Peripherally Inserted Central Catheter/Midline Placement  The IV Nurse has discussed with the patient and/or persons authorized to consent for the patient, the purpose of this procedure and the potential benefits and risks involved with this procedure.  The benefits include less needle sticks, lab draws from the catheter, and the patient may be discharged home with the catheter. Risks include, but not limited to, infection, bleeding, blood clot (thrombus formation), and puncture of an artery; nerve damage and irregular heartbeat and possibility to perform a PICC exchange if needed/ordered by physician.  Alternatives to this procedure were also discussed.  Bard Power PICC patient education guide, fact sheet on infection prevention and patient information card has been provided to patient /or left at bedside.    PICC/Midline Placement Documentation  PICC Single Lumen 10/18/19 PICC Right Brachial 38 cm 0 cm (Active)  Indication for Insertion or Continuance of Line Prolonged intravenous therapies 10/18/19 1503  Exposed Catheter (cm) 0 cm 10/18/19 1503  Site Assessment Clean;Dry;Intact 10/18/19 1503  Line Status Flushed;Blood return noted 10/18/19 1503  Dressing Type Transparent 10/18/19 1503  Dressing Status Clean;Dry;Intact;Antimicrobial disc in place;Other (Comment) 10/18/19 1503  Dressing Intervention New dressing 10/18/19 1503  Dressing Change Due 10/25/19 10/18/19 1503    Telephone consent signed by wife   [name] 10/18/2019, 3:05 PM

## 2019-10-18 NOTE — Progress Notes (Signed)
Inpatient Rehab Admissions Coordinator:   I have a bed available and approval from DR. Christian for pt to admit to CIR today, once PICC is placed for IV abx.  Will let pt/family and CM know.    Shann Medal, PT, DPT Admissions Coordinator [phone] 10/18/19  9:57 AM

## 2019-10-18 NOTE — H&P (Signed)
Physical Medicine and Rehabilitation Admission H&P    Chief Complaint  Patient presents with  . 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 Seizures    HPI:  Eric Larsen is an 86 year old male with history of osteoporosis, gait disorder, heriditary spherocytosis, recent diagnosis of dementia who was admitted on 10/14/2019 with intermittent confusion and progressive weakness x1 week.  History taken from wife due to cognition.  Wife also reports 6 month history of problems with memory and weight loss. He was found to be febrile with temp of 102.6 and MRI brain done showing left anterior medial temporal lobe thickening and T2 hyperintensity gyri with edema and small nonmasslike enhancement left anterior temporal lobe.  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  Empiric IV acyclovir was initiated.  TSH- 2.289 and levothyroxine increased to 125 mcg/day. EEG done showing sharp waves in left anterior temporal lesion with mild to moderate diffuse encephalopathy.  He was loaded with Keppra but continued to have issues with confusion and bouts of lethargy.  Hospital course further complicated by dysphagia due to tendency to hold orals and therefore he was placed on a dysphagia 1, thin liquid diet.  LT-EEG was negative for seizures but did show epileptogenicity and is to continue Keppra.  Blood cultures x2 done in 1+ for staph felt to be contaminant. LP was ordered for work-up and showed elevated protein-139 and lymphocytosis with WBC 84.  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  Patient with resultant cognitive deficits with unintelligible language, difficulty following commands, and significant weakness requiring STEDY to stand.  CIR recommended due to functional decline.  Please see preadmission assessment from earlier today as well.  Review of Systems  Unable to perform ROS: Mental acuity   Past Medical History:  Diagnosis  Date  . Hypothyroidism   . Osteoporosis   . Thyroid disease     Past Surgical History:  Procedure Laterality Date  . CHOLECYSTECTOMY    . HIP FRACTURE SURGERY    . INTRAMEDULLARY (IM) NAIL INTERTROCHANTERIC Right 08/13/2017   Procedure: INTRAMEDULLARY (IM) NAIL INTERTROCHANTRIC;  Surgeon: Earnestine Leys, MD;  Location: [facility] ORS;  Service: Orthopedics;  Laterality: Right;  . SHOULDER SURGERY      Family History  Problem Relation Age of Onset  . Hereditary spherocytosis Father   . Hereditary spherocytosis Sister     Social History:  Lives with wife (married X 17 years). Daughters live out of town. Retired Tax Forensic psychologist. He was independent with cane and held on to walls when walking. He smoked for 30 years quit > 20 years ago?  He has never used smokeless tobacco. He has a mixed drink at nights and drinks beer occasionally. Per reports he does not use drugs.   Allergies: No Known Allergies    Medications Prior to Admission  Medication Sig Dispense Refill  . acetaminophen (TYLENOL) 325 MG tablet Take 2 tablets (650 mg total) by mouth every 6 (six) hours as needed for mild pain or headache (or Fever >/= 101).    . calcium-vitamin D (OSCAL WITH D) 500-200 MG-UNIT TABS tablet Take 1 tablet by mouth 2 (two) times daily with a meal.    . Cholecalciferol 25 MCG (1000 UT) CHEW Chew 2,000 Units by mouth daily.    Marland Kitchen donepezil (ARICEPT) 5 MG tablet Take 1 tablet by mouth at bedtime.    . folic acid (FOLVITE) [id number] MCG tablet Take 400 mcg by mouth  daily.    . folic acid (FOLVITE) Q000111Q MCG tablet Take 800 mcg by mouth daily.    . Multiple Vitamin (MULTIVITAMIN) capsule Take 1 capsule by mouth daily.    . Multiple Vitamins-Minerals (PRESERVISION AREDS PO) Take 1 tablet by mouth every evening.    . Niacin (VITAMIN B-3 PO) Take 1 tablet by mouth daily.    Marland Kitchen SYNTHROID 125 MCG tablet Take 125 mcg by mouth daily.    Marland Kitchen enoxaparin (LOVENOX) 40 MG/0.4ML injection Inject 0.4 mLs (40 mg total) into the skin  daily for 14 days. (Patient not taking: Reported on 10/14/2019) 14 Syringe 0  . ferrous sulfate 325 (65 FE) MG tablet Take 1 tablet (325 mg total) by mouth daily with breakfast. (Patient not taking: Reported on 09/07/2017) 30 tablet 0  . HYDROcodone-acetaminophen (NORCO/VICODIN) 5-325 MG tablet Take 1 tablet by mouth every 4 (four) hours as needed for moderate pain or severe pain. (Patient not taking: Reported on 09/07/2017) 30 tablet 0  . metoprolol tartrate (LOPRESSOR) 25 MG tablet Take 0.5 tablets (12.5 mg total) by mouth 2 (two) times daily. (Patient not taking: Reported on 09/07/2017) 15 tablet 0  . polyethylene glycol (MIRALAX / GLYCOLAX) packet Take 17 g by mouth daily. (Patient not taking: Reported on 09/07/2017) 30 each 0  . senna (SENOKOT) 8.6 MG TABS tablet Take 1 tablet (8.6 mg total) by mouth 2 (two) times daily. (Patient not taking: Reported on 09/07/2017) 60 each 0    Drug Regimen Review  Drug regimen was reviewed and remains appropriate with no significant issues identified  Home: Home Living Family/patient expects to be discharged to:: Private residence Living Arrangements: Spouse/significant other Available Help at Discharge: Family, Available 24 hours/day Type of Home: House Home Access: Stairs to enter [employer] of Steps: 4 Entrance Stairs-Rails: Right, Left, Can reach both Home Layout: Two level, Able to live on main level with bedroom/bathroom Alternate Level Stairs-Number of Steps: Flight Alternate Level Stairs-Rails: Right Bathroom Shower/Tub: Multimedia programmer: Standard Home Equipment: Civil engineer, contracting, Grab bars - tub/shower, Environmental consultant - 4 wheels   Functional History: Prior Function Level of Independence: Needs assistance Gait / Transfers Assistance Needed: Walks independently; assist needed recently to go up and down the flight of stairs to reach bedroom ADL's / Homemaking Assistance Needed: was independent with ADLs/selfcare, toileting, self  feeding Comments: Had gone up and down the flight of steps in his home as recently as 2/26  Functional Status:  Mobility: Bed Mobility Overal bed mobility: Needs Assistance Bed Mobility: Rolling, Supine to Sit, Sit to Supine Rolling: Max assist Sidelying to sit: Max assist Supine to sit: Max assist Sit to supine: Total assist, +2 for physical assistance General bed mobility comments: increased tmie and effort, used rail and PT as an anchor to scoot hips forward at EOB Transfers Overall transfer level: Needs assistance Equipment used: 2 person hand held assist, Ambulation equipment used Transfer via Lift Equipment: Stedy Transfers: Sit to/from [employer] to Stand: Min assist, Max assist, +2 physical assistance General transfer comment: min A +2 sit to stand from bed, max A +2 from Miller seat (pt fatigued) Ambulation/Gait Assistive device: 2 person hand held assist General Gait Details: deferred- fatigue after sitting on stedy seats for 3-4 minutes    ADL: ADL Overall ADL's : Needs assistance/impaired Eating/Feeding: Set up, Supervision/ safety, Sitting, Bed level Grooming: Wash/dry hands, Wash/dry face, Min guard, Sitting Upper Body Bathing: Minimal assistance Lower Body Bathing: Maximal assistance Upper Body Dressing : Minimal assistance Lower  Body Dressing: Total assistance Toilet Transfer: Minimal assistance, Maximal assistance, +2 for physical assistance Toilet Transfer Details (indicate cue type and reason): sit - stand from EOB min A +2, max A +2 from [place]- Clothing Manipulation and Hygiene: Total assistance, Bed level Functional mobility during ADLs: Minimal assistance, Maximal assistance, +2 for physical assistance  Cognition: Cognition Overall Cognitive Status: Impaired/Different from baseline Orientation Level: Oriented to person, Disoriented to place, Disoriented to time, Disoriented to situation Cognition Arousal/Alertness: Awake/alert Behavior  During Therapy: WFL for tasks assessed/performed Overall Cognitive Status: Impaired/Different from baseline Area of Impairment: Orientation, Attention, Following commands, Safety/judgement, Awareness, Problem solving Orientation Level: Disoriented to, Place, Time, Situation Current Attention Level: Focused Following Commands: Follows one step commands with increased time Safety/Judgement: Decreased awareness of safety Awareness: Intellectual Problem Solving: Slow processing, Decreased initiation, Difficulty sequencing, Requires verbal cues, Requires tactile cues General Comments: Made attempts at answering questions, however verbalizations were largely unintelligible, difficulty with word finding   Blood pressure 92/77, pulse 83, temperature 97.6 F (36.4 C), temperature source Oral, resp. rate 17, height 5\' 4"  (1.626 m), weight 53.5 kg, SpO2 97 %. Physical Exam  Vitals reviewed. Constitutional: He appears well-developed.  Frail  HENT:  Head: Normocephalic and atraumatic.  Eyes: EOM are normal. Right eye exhibits no discharge. Left eye exhibits no discharge.  Neck: No tracheal deviation present. No thyromegaly present.  Respiratory: Effort normal. No respiratory distress.  GI: Soft. He exhibits no distension.  Musculoskeletal:     Comments: No edema or tenderness in extremities  Neurological: He is alert.  Oriented to person name only, is able to recognize wife, but unable to identify her name. Motor: Grossly 4+/5 throughout  Skin: Skin is warm and dry.  Psychiatric: His speech is delayed. He is slowed. Cognition and memory are impaired.    Results for orders placed or performed during the hospital encounter of 10/14/19 (from the past 48 hour(s))  Protein and glucose, CSF     Status: Abnormal   Collection Time: 10/16/19  3:22 PM  Result Value Ref Range   Glucose, CSF 56 40 - 70 mg/dL   Total  Protein, CSF 139 (H) 15 - 45 mg/dL    Comment: Performed at [facility] [address] [place], [place] [address]  CSF cell count with differential     Status: Abnormal   Collection Time: 10/16/19  3:22 PM  Result Value Ref Range   Tube # 3    Color, CSF COLORLESS COLORLESS   Appearance, CSF CLEAR CLEAR   Supernatant NOT INDICATED    RBC Count, CSF 6 (H) 0 /cu mm   WBC, CSF 84 (HH) 0 - 5 /cu mm    Comment: CRITICAL RESULT CALLED TO, READ BACK BY AND VERIFIED WITH: C YAP RN [id number] K FORSYTH    Segmented Neutrophils-CSF 0 0 - 6 %   Lymphs, CSF 87 (H) 40 - 80 %   Monocyte-Macrophage-Spinal Fluid 13 (L) 15 - 45 %   Eosinophils, CSF 0 0 - 1 %    Comment: Performed at [facility] [address], [place], [place] [address]  Pathologist smear review     Status: None   Collection Time: 10/16/19  3:22 PM  Result Value Ref Range   Path Review Lymphocytosis.     Comment: Reviewed by Chrystie Nose. Saralyn Pilar, M.D. 10/17/2019. Performed at [facility], [place] [address], [place], [place] [address]   CSF culture with Stat gram  stain     Status: None (Preliminary result)   Collection Time: 10/16/19  3:23 PM   Specimen: CSF; Cerebrospinal Fluid  Result Value Ref Range   Specimen Description CSF    Special Requests Normal    Gram Stain      WBC PRESENT, PREDOMINANTLY MONONUCLEAR NO ORGANISMS SEEN CYTOSPIN SMEAR    Culture      NO GROWTH 2 DAYS Performed at [facility] [address], [place], [place] [address]    Report Status PENDING   Herpes simplex virus (HSV), DNA by PCR Cerebrospinal Fluid     Status: None   Collection Time: 10/16/19  3:23 PM  Result Value Ref Range   Source of Sample CSF    HSV 1 DNA Positive Negative    Comment: CRITICAL RESULT CALLED TO, READ BACK BY AND VERIFIED WITH: NORMAN CHAVIS,RN AT [id number] 10/18/2019 BY ZBEECH. Performed at [facility], [place] [address] [place], [place] [address]    HSV 2 DNA Negative Negative    Comment: (NOTE) This test was developed and its performance characteristics determined by  [employer]. It has not been cleared or approved by the U.S. Food and Drug Administration. The FDA has determined that such clearance or approval is not necessary. This test is used for clinical purposes. It should not be regarded as investigational or research. Called to [facility] on 10/17/2019 at 23:49 ET for test(s) HSV-1 DNA Performed At: [facility] [place], [place] [id number] Rush Farmer MD [id number]   CBC     Status: Abnormal   Collection Time: 10/17/19  5:06 AM  Result Value Ref Range   WBC 9.3 4.0 - 10.5 K/uL   RBC 3.88 (L) 4.22 - 5.81 MIL/uL   Hemoglobin 13.3 13.0 - 17.0 g/dL   HCT 38.6 (L) 39.0 - 52.0 %   MCV 99.5 80.0 - 100.0 fL   MCH 34.3 (H) 26.0 - 34.0 pg   MCHC 34.5 30.0 - 36.0 g/dL   RDW 19.6 (H) 11.5 - 15.5 %   Platelets 231 150 - 400 K/uL   nRBC 0.0 0.0 - 0.2 %    Comment: Performed at [facility], [place] [address], [place], [place] [id number]  Basic metabolic panel     Status: Abnormal   Collection Time: 10/17/19  5:06 AM  Result Value Ref Range   Sodium 138 135 - 145 mmol/L   Potassium 3.8 3.5 - 5.1 mmol/L   Chloride 105 98 - 111 mmol/L   CO2 26 22 - 32 mmol/L   Glucose, Bld 117 (H) 70 - 99 mg/dL    Comment: Glucose reference range applies only to samples taken after fasting for at least 8 hours.   BUN 20 8 - 23 mg/dL   Creatinine, Ser 0.89 0.61 - 1.24 mg/dL   Calcium 8.7 (L) 8.9 - 10.3 mg/dL   GFR calc non Af Amer >60 >60 mL/min   GFR calc Af Amer >60 >60 mL/min   Anion gap 7 5 - 15    Comment: Performed at [facility] [address], [place], [place] [address]   [place] EKG SITE RITE  Result Date: 10/18/2019 If [facility] image not attached, placement could not be confirmed due to current cardiac rhythm.      Medical Problem List and Plan: 1.  Cognitive deficits with unintelligible language, weakness, secondary to herpes encephalitis.  -patient may shower  -ELOS/Goals: 9-14  days/Supervision.  Admit  to CIR  Cont IV acyclovir 2.  Antithrombotics: -DVT/anticoagulation:  Pharmaceutical: Lovenox  -antiplatelet therapy: NA 3. Pain Management: PRN medications 4. Mood: Team supporst  -antipsychotic agents: NA 5. Neuropsych: This patient is not capable of making decisions on his own behalf. 6. Skin/Wound Care: Routine pressure relief measures.  7. Fluids/Electrolytes/Nutrition: Monitor I/O. CMP ordered. 8. Hypothyroid: Now on increased supplement since 3/01 to 175mcg. 9. Epileptogenic potential: Continue Keppra 500 mg bid for seizure 10. Hypokalemia: has resolved with supplementation. CMP ordered for tomorrow AM. 11. Macrocytic anemia: CBC ordered for tomorrow  Bary Leriche, PA-C 10/18/2019  I have personally performed a face to face diagnostic evaluation, including, but not limited to relevant history and physical exam findings, of this patient and developed relevant assessment and plan.  Additionally, I have reviewed and concur with the physician assistant's documentation above.  Delice Lesch, MD, ABPMR

## 2019-10-18 NOTE — Progress Notes (Signed)
   NAME:  Eric Larsen, MRN:  [id number], DOB:  07-02-1931, LOS: 4 ADMISSION DATE:  10/14/2019   Subjective/Interm history  No overnight events VSS Updated on +HSV on CSF  Objective   Blood pressure 115/64, pulse 69, temperature 97.8 F (36.6 C), temperature source Oral, resp. rate 16, height 5\' 4"  (1.626 m), weight 53.5 kg, SpO2 100 %.     Intake/Output Summary (Last 24 hours) at 10/18/2019 0552 Last data filed at 10/18/2019 0200 Gross per 24 hour  Intake 1738.81 ml  Output 275 ml  Net 1463.81 ml   Filed Weights   10/14/19 0615  Weight: 53.5 kg    Examination: GENERAL: in no acute distress CARDIAC: heart RRR.  PULMONARY: acyanotic. Lung sounds clear to auscultation. NEURO: alert but less conversational this morning and more word finding difficulty  Significant Diagnostic Tests:   2/28 MRI brain>>thickening and increased T2 signal in the left anterior and medial temporal lobe.. findings most consistent with herpes encephalitis in the setting of febrile illness. Can not rule out infiltrating tumor.  EEG: epileptogenicity and cortical dysfunction in the left anterior temporal region.  LP CSF analysis: protein 139 (elevated), glucose 56(wnl), WBC 84 (elevated), Lymphocytes 87 (elevated), 0 neurotrophils and eosinophils  Micro Data:  CSF culture>> NGTD HSV DNA>> positive  Antimicrobials:  Acyclovir 2/28>>  Labs    CBC Latest Ref Rng & Units 10/17/2019 10/16/2019 10/15/2019  WBC 4.0 - 10.5 K/uL 9.3 9.9 12.4(H)  Hemoglobin 13.0 - 17.0 g/dL 13.3 12.4(L) 13.8  Hematocrit 39.0 - 52.0 % 38.6(L) 37.3(L) 41.2  Platelets 150 - 400 K/uL 231 201 187   BMP Latest Ref Rng & Units 10/17/2019 10/16/2019 10/15/2019  Glucose 70 - 99 mg/dL 117(H) 107(H) 95  BUN 8 - 23 mg/dL 20 23 22   Creatinine 0.61 - 1.24 mg/dL 0.89 0.95 1.04  Sodium 135 - 145 mmol/L 138 140 139  Potassium 3.5 - 5.1 mmol/L 3.8 3.3(L) 3.4(L)  Chloride 98 - 111 mmol/L 105 105 103  CO2 22 - 32 mmol/L 26 26 24   Calcium 8.9  - 10.3 mg/dL 8.7(L) 8.5(L) 8.7(L)    Summary  86 yo male with PMH of hypothyroidism and hereditary spherocytosis who is presenting with 1w hx of progressive confusion, weakness, urinary incontinence and cough.  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   Remains afebrile. Mental status a little worse this morning in comparison to yesterday however may have been because he just woke up. Plan Will require 2-3w course of acyclovir--day #4/14 Continue keppra Consult placed for PICC placement  Physical deconditioning. PT recommending CIR who is ready to accept.   Best practice:  CODE STATUS: FULL Diet: dysphagia 1 DVT for prophylaxis: SCDs Dispo: medically stable to discharge to CIR once PICC is placed   Mitzi Hansen, MD [facility] PGY-1 PAGER #: [phone] 10/18/19  5:52 AM

## 2019-10-18 NOTE — Progress Notes (Signed)
Upon rounding patient noted to be agitated, anxious, confused oriented to self, pulling at IV site ( PICC line site was wrapped in Kerlix dressing, noted patient throwing leg onto side railing,and refusing to wear clothing.Tele sitter monitoring initiated for safety precautionary measures. Floor mats and bed alarm on medium setting, redirected and attempts made to reorient patient. Closely monitor and assisted

## 2019-10-18 NOTE — PMR Pre-admission (Signed)
PMR Admission Coordinator Pre-Admission Assessment  Patient: Eric Larsen is an 86 y.o., male MRN: [id number] DOB: 11-Sep-1930 Height: _0  (162.6 cm) Weight: 53.5 kg  Insurance Information HMO:     PPO:      PCP:      IPA:      80/20:      OTHER:  PRIMARY: Medicare A and B      Policy#: [id number]      Subscriber: pt CM Name:       Phone#:      Fax#:  Pre-Cert#: verified Civil engineer, contracting:  Benefits:  Phone #:      Name:  Eff. Date: A 05/17/98, B 01/16/99     Deduct: $1484      Out of Pocket Max: n/a      Life Max: n/a CIR: 100%      SNF: 20 full days Outpatient: 80%     Co-Pay: 20% Home Health: 100%      Co-Pay:  DME: 80%     Co-Pay: 20% Providers:  SECONDARY:       Policy#:       Subscriber:  CM Name:       Phone#:      Fax#:  Pre-Cert#:       Employer:  Benefits:  Phone #:      Name:  Eff. Date:      Deduct:       Out of Pocket Max:       Life Max:  CIR:       SNF:  Outpatient:      Co-Pay:  Home Health:       Co-Pay:  DME:      Co-Pay:   Medicaid Application Date:       Case Manager:  Disability Application Date:       Case Worker:   The "Data Collection Information Summary" for patients in Inpatient Rehabilitation Facilities with attached "Privacy Act [place] Records" was provided and verbally reviewed with: Patient and Family  Emergency Contact Information Contact Information    Name Relation Home Work Mobile   [name], [name] Spouse [phone]  [phone]      Current Medical History  Patient Admitting Diagnosis: encephalopathy  History of Present Illness: Pt is an 86 y/o male admitted to [facility] on 10/14/2019 with a one week history of progressive confusion, urinary incontinence, and generalized weakness.  PMH significant for spherocytosis and dementia, hypothyroidism.  Pt febrile on admission (102.6), CT of head/neck and chest xray without acute intracranial findings or active infection.  MRI on 2/28 showed increased T2 signal in  left anterior and medial temporal lobe consistent with herpes encephalitis.  EEG showed epileptogenicity and cortical dysfunction in left anterior temporal region.  LP showed elevated protein (139), WBC (84), and lymphocytes (87), again consistent with HSV encephalitis.  Pt with ongoing confusion.  Recommendation for 2-3 week course of acyclovir, and keppra.  Therapy evaluations were completed and pt was recommended for CIR.      Patient[facility] has been reviewed by the rehabilitation admission coordinator and physician.  Past Medical History  Past Medical History:  Diagnosis Date  . Hypothyroidism   . Osteoporosis   . Thyroid disease     Family History   family history includes Hereditary spherocytosis in his father and sister.  Prior Rehab/Hospitalizations Has the patient had prior rehab or hospitalizations prior to admission? No  Has the patient  had major surgery during 100 days prior to admission? No   Current Medications  Current Facility-Administered Medications:  .  0.9 %  sodium chloride infusion, , Intravenous, PRN, Aldine Contes, MD, Last Rate: 10 mL/hr at 10/15/19 0616, 250 mL at 10/15/19 0616 .  0.9 %  sodium chloride infusion, , Intravenous, Continuous, Seawell, Jaimie A, DO, Last Rate: 75 mL/hr at 10/17/19 1436, New Bag at 10/17/19 1436 .  acetaminophen (TYLENOL) tablet 650 mg, 650 mg, Oral, Q6H PRN, 650 mg at 10/18/19 0526 **OR** acetaminophen (TYLENOL) suppository 650 mg, 650 mg, Rectal, Q6H PRN, Asencion Noble, MD .  acyclovir (ZOVIRAX) 500 mg in dextrose 5 % 100 mL IVPB, 500 mg, Intravenous, Q12H, Erenest Blank, RPH, Last Rate: 110 mL/hr at 10/18/19 0928, 500 mg at 10/18/19 0928 .  calcium-vitamin D (OSCAL WITH D) 500-200 MG-UNIT per tablet 1 tablet, 1 tablet, Oral, BID WC, Seawell, Jaimie A, DO, 1 tablet at 10/18/19 0816 .  folic acid (FOLVITE) tablet 1 mg, 1,000 mcg, Oral, Daily, Seawell, Jaimie A, DO, 1 mg at 10/18/19  0816 .  lactated ringers bolus 500 mL, 500 mL, Intravenous, Once, Seawell, Jaimie A, DO .  levETIRAcetam (KEPPRA) IVPB 500 mg/100 mL premix, 500 mg, Intravenous, Q12H, Amie Portland, MD, Last Rate: 400 mL/hr at 10/18/19 0819, 500 mg at 10/18/19 0819 .  levothyroxine (SYNTHROID) tablet 125 mcg, 125 mcg, Oral, QAC breakfast, Aslam, Sadia, MD, 125 mcg at 10/18/19 0526 .  lidocaine (XYLOCAINE) 1 % (with pres) injection 10 mL, 10 mL, Infiltration, Once, Aroor, Karena Addison R, MD .  polyethylene glycol (MIRALAX / GLYCOLAX) packet 17 g, 17 g, Oral, Daily PRN, Seawell, Jaimie A, DO .  senna-docusate (Senokot-S) tablet 1 tablet, 1 tablet, Oral, BID PRN, Seawell, Jaimie A, DO  Patients Current Diet:  Diet Order            DIET - DYS 1 Room service appropriate? No; Fluid consistency: Thin  Diet effective now              Precautions / Restrictions Precautions Precautions: Fall Restrictions Weight Bearing Restrictions: No   Has the patient had 2 or more falls or a fall with injury in the past year? No  Prior Activity Level Community (5-7x/wk): was active with his wife, retired, occasional use of SPC prior to admission, was getting ready to start HHPT for strengthening   Prior Functional Level Self Care: Did the patient need help bathing, dressing, using the toilet or eating? Independent  Indoor Mobility: Did the patient need assistance with walking from room to room (with or without device)? Independent  Stairs: Did the patient need assistance with internal or external stairs (with or without device)? Needed some help  Functional Cognition: Did the patient need help planning regular tasks such as shopping or remembering to take medications? Needed some help  Home Assistive Devices / Labette Devices/Equipment: Cane (specify quad or straight), Walker (specify type) Home Equipment: Shower seat, Grab bars - tub/shower, Environmental consultant - 4 wheels  Prior Device Use: Indicate devices/aids  used by the patient prior to current illness, exacerbation or injury? occasional SPC, immediately PTA using RW  Current Functional Level Cognition  Overall Cognitive Status: Impaired/Different from baseline Current Attention Level: Focused Orientation Level: Oriented to person, Disoriented to place, Disoriented to time, Disoriented to situation Following Commands: Follows one step commands with increased time Safety/Judgement: Decreased awareness of safety General Comments: Made attempts at answering questions, however verbalizations were largely unintelligible, difficulty with word finding  Extremity Assessment (includes Sensation/Coordination)  Upper Extremity Assessment: Generalized weakness  Lower Extremity Assessment: Defer to PT evaluation    ADLs  Overall ADL's : Needs assistance/impaired Eating/Feeding: Set up, Supervision/ safety, Sitting, Bed level Grooming: Wash/dry hands, Wash/dry face, Min guard, Sitting Upper Body Bathing: Minimal assistance Lower Body Bathing: Maximal assistance Upper Body Dressing : Minimal assistance Lower Body Dressing: Total assistance Toilet Transfer: Minimal assistance, Maximal assistance, +2 for physical assistance Toilet Transfer Details (indicate cue type and reason): sit - stand from EOB min A +2, max A +2 from [place]- Clothing Manipulation and Hygiene: Total assistance, Bed level Functional mobility during ADLs: Minimal assistance, Maximal assistance, +2 for physical assistance    Mobility  Overal bed mobility: Needs Assistance Bed Mobility: Rolling, Supine to Sit, Sit to Supine Rolling: Max assist Sidelying to sit: Max assist Supine to sit: Max assist Sit to supine: Total assist, +2 for physical assistance General bed mobility comments: increased tmie and effort, used rail and PT as an anchor to scoot hips forward at EOB    Transfers  Overall transfer level: Needs assistance Equipment used: 2 person hand held assist,  Ambulation equipment used Transfer via Lift Equipment: Stedy Transfers: Sit to/from [employer] to Stand: Min assist, Max assist, +2 physical assistance General transfer comment: min A +2 sit to stand from bed, max A +2 from [place] seat (pt fatigued)    Ambulation / Gait / Stairs / Wheelchair Mobility  Ambulation/Gait Assistive device: 2 person hand held assist General Gait Details: deferred- fatigue after sitting on stedy seats for 3-4 minutes    Posture / Balance Balance Overall balance assessment: Needs assistance Sitting-balance support: Feet supported Sitting balance-[name]: Poor Postural control: Posterior lean Standing balance support: Bilateral upper extremity supported Standing balance-[name]: Poor    Special needs/care consideration BiPAP/CPAP no CPM no Continuous Drip IV no Dialysis no        Days n/a Life Vest no Oxygen no Special Bed no Trach Size no Wound Vac (area) no      Location n/a Skin intact                              Location  Bowel mgmt: incontinent Bladder mgmt: incontinent Diabetic mgmt: no Behavioral consideration yes, sundowns Chemo/radiation no   Previous Home Environment (from acute therapy documentation) Living Arrangements: Spouse/significant other Available Help at Discharge: Family, Available 24 hours/day Type of Home: House Home Layout: Two level, Able to live on main level with bedroom/bathroom Alternate Level Stairs-Rails: Right Alternate Level Stairs-Number of Steps: Flight Home Access: Stairs to enter Entrance Stairs-Rails: Right, Left, Can reach both Entrance Stairs-Number of Steps: 4 Bathroom Shower/Tub: Multimedia programmer: Standard Home Care Services: No  Discharge Living Setting Plans for Discharge Living Setting: Patient's home Type of Home at Discharge: House Discharge Home Layout: Two level, Able to live on main level with bedroom/bathroom Alternate Level Stairs-Rails: Right Alternate Level  Stairs-Number of Steps: 14 Discharge Home Access: Stairs to enter Entrance Stairs-Rails: Can reach both Entrance Stairs-Number of Steps: 4 Discharge Bathroom Shower/Tub: Walk-in shower Discharge Bathroom Toilet: Standard Discharge Bathroom Accessibility: Yes How Accessible: Accessible via walker Does the patient have any problems obtaining your medications?: No  Social/Family/Support Systems Patient Roles: Spouse Anticipated Caregiver: spouse, [name]  Anticipated Ambulance person Information: [phone] Ability/Limitations of Caregiver: supervision to min assist Caregiver Availability: 24/7 Discharge Plan Discussed with Primary Caregiver: Yes Is Caregiver In Agreement with  Plan?: Yes Does Caregiver/Family have Issues with Lodging/Transportation while Pt is in Rehab?: No  Goals/Additional Needs Patient/Family Goal for Rehab: PT/OT supervision, SLP min assist Expected length of stay: 21-24 days Dietary Needs: dys 1/thin Special Service Needs: IV abx 2-3 weeks Pt/Family Agrees to Admission and willing to participate: Yes Program Orientation Provided & Reviewed with Pt/Caregiver Including Roles  & Responsibilities: Yes  Decrease burden of Care through IP rehab admission: n/a  Possible need for SNF placement upon discharge: not anticipated  Patient Condition: I have reviewed medical records from [facility], spoken with CSW, and patient and spouse. I met with patient at the bedside for inpatient rehabilitation assessment.  Patient will benefit from ongoing PT, OT and SLP, can actively participate in 3 hours of therapy a day 5 days of the week, and can make measurable gains during the admission.  Patient will also benefit from the coordinated team approach during an Inpatient Acute Rehabilitation admission.  The patient will receive intensive therapy as well as Rehabilitation physician, nursing, social worker, and care management interventions.  Due to bladder management,  bowel management, safety, skin/wound care, disease management, medication administration, pain management and patient education the patient requires 24 hour a day rehabilitation nursing.  The patient is currently max +2 to total +2 with mobility and basic ADLs.  Discharge setting and therapy post discharge at home is anticipated.  Patient has agreed to participate in the Acute Inpatient Rehabilitation Program and will admit today.  Preadmission Screen Completed By:  Michel Santee, PT, DPT 10/18/2019 10:56 AM ______________________________________________________________________   Discussed status with Dr. Posey Pronto on 10/18/19  at 10:56 AM  and received approval for admission today.  Admission Coordinator:  Michel Santee, PT, DPT time 10:56 AM Sudie Grumbling 10/18/19    Assessment/Plan: Diagnosis: Herpes encephalitis 1. Does the need for close, 24 hr/day Medical supervision in concert with the patient's rehab needs make it unreasonable for this patient to be served in a less intensive setting? Yes 2. Co-Morbidities requiring supervision/potential complications: Thyroid disease, osteoporosis 3. Due to bladder management, bowel management, safety, skin/wound care, disease management, medication administration, pain management and patient education, does the patient require 24 hr/day rehab nursing? Yes 4. Does the patient require coordinated care of a physician, rehab nurse, PT, OT, and SLP to address physical and functional deficits in the context of the above medical diagnosis(es)? Yes Addressing deficits in the following areas: balance, endurance, locomotion, strength, transferring, bowel/bladder control, bathing, dressing, toileting, cognition, language, swallowing and psychosocial support 5. Can the patient actively participate in an intensive therapy program of at least 3 hrs of therapy 5 days a week? Yes 6. The potential for patient to make measurable gains while on inpatient rehab is  excellent 7. Anticipated functional outcomes upon discharge from inpatient rehab: modified independent and supervision PT, modified independent and supervision OT, modified independent and supervision SLP 8. Estimated rehab length of stay to reach the above functional goals is: 9-14 days. 9. Anticipated discharge destination: Home 10. Overall Rehab/Functional Prognosis: excellent   MD Signature: Delice Lesch, MD, ABPMR

## 2019-10-18 NOTE — Progress Notes (Signed)
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  Treatment duration is usually 14 days of IV acyclovir, however times oral vanciclovir is also used.  Asked internal medicine service team to discuss with ID appropriate treatment duration and antibiotic choice.

## 2019-10-18 NOTE — Progress Notes (Signed)
Pharmacy Antibiotic Note  Eric Larsen is a 86 y.o. male admitted on 10/14/2019 with altered mental status.  Pharmacy has been consulted for Acyclovir dosing for r/o HSV encephalitis. WBC WNL. CrCl ~45, stable. CSF positive for HSV 1, Day 4 of antiviral medications.   Plan: Continue Acyclovir 10 mg/kg IV q12h Trend WBC, temp, renal function  F/U conversion to PO valtrex and LOT  Height: 5\' 4"  (162.6 cm) Weight: 118 lb (53.5 kg) IBW/kg (Calculated) : 59.2  Temp (24hrs), Avg:97.7 F (36.5 C), Min:97.5 F (36.4 C), Max:97.8 F (36.6 C)  Recent Labs  Lab 10/14/19 0704 10/14/19 0849 10/15/19 0439 10/16/19 0303 10/17/19 0506  WBC 7.3  --  12.4* 9.9 9.3  CREATININE 1.06  --  1.04 0.95 0.89  LATICACIDVEN 1.3 1.0  --   --   --     Estimated Creatinine Clearance: 45.1 mL/min (by C-G formula based on SCr of 0.89 mg/dL).    No Known Allergies  [name] A. Levada Dy, PharmD, BCPS, FNKF Clinical Pharmacist [place] Please utilize Amion for appropriate phone number to reach the unit pharmacist ([place])

## 2019-10-18 NOTE — Plan of Care (Signed)
  Problem: Education: Goal: Knowledge of General Education information will improve Description: Including pain rating scale, medication(s)/side effects and non-pharmacologic comfort measures Outcome: Progressing   Problem: Elimination: Goal: Will not experience complications related to bowel motility Outcome: Progressing   Problem: Safety: Goal: Ability to remain free from injury will improve Outcome: Progressing   

## 2019-10-18 NOTE — Progress Notes (Signed)
Physical Medicine and Rehabilitation Consult     Reason for Consult: Encephalitis Referring Physician: Dr.Narendra     HPI: Eric Larsen is a 86 y.o. male with history of hypothyroidism, hereditary spherocytosis, gait disorder--left shoulder and bilateral hip Fx in the past,  recent diagnosis of dementia, 1 week history of intermittent confusion with progressive weakness.  Wife also reports progressive weight loss with weakness for past 6 months, difficulty managing fiances as well as problems with memory.  Uses cane and hold on to the walls for walking.    He was admitted on 10/14/2019 for work-up and was febrile in ED with temp of 102.6.  MRI brain showed left anterior and medial temporal lobe with thickening and T2 hyperintense gyri with edema and small area of non-mass-like enhancement left anterior temporal lobe.  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  EEG showed sharp waves in sharp waves in left anterior temporal lesion with mild to moderate diffuse encephalopathy and she was loaded with Keppra.  He continues to have issues with confusion and on LT-EEG. LP ordered for work up.  Swallow evaluations revealed dysphagia with tendency to hold orals therefore placed on dysphagia 1, thin liquids.  Physical therapy evaluations completed revealing deficits in mobility and CIR recommended due to functional decline     Review of Systems  Unable to perform ROS: Mental acuity          Past Medical History:  Diagnosis Date  . Hypothyroidism    . Osteoporosis    . Thyroid disease             Past Surgical History:  Procedure Laterality Date  . HIP FRACTURE SURGERY      . INTRAMEDULLARY (IM) NAIL INTERTROCHANTERIC Right 08/13/2017    Procedure: INTRAMEDULLARY (IM) NAIL INTERTROCHANTRIC;  Surgeon: Earnestine Leys, MD;  Location: [facility] ORS;  Service: Orthopedics;  Laterality: Right;  . SHOULDER SURGERY               Family History   Problem Relation Age of Onset  . Hereditary spherocytosis Father    . Hereditary spherocytosis Sister        Social History: Married--2nd marriage X 3 years and daughters live out of town. Retired Air traffic controller.  Per reports that he smoked for about 30 years. He has never used smokeless tobacco. Per reports--has a mixed drink at nights with beer occasionally.  He does not use drugs.      Allergies: No Known Allergies            Medications Prior to Admission  Medication Sig Dispense Refill  . acetaminophen (TYLENOL) 325 MG tablet Take 2 tablets (650 mg total) by mouth every 6 (six) hours as needed for mild pain or headache (or Fever >/= 101).      . calcium-vitamin D (OSCAL WITH D) 500-200 MG-UNIT TABS tablet Take 1 tablet by mouth 2 (two) times daily with a meal.      . Cholecalciferol 25 MCG (1000 UT) CHEW Chew 2,000 Units by mouth daily.      Marland Kitchen donepezil (ARICEPT) 5 MG tablet Take 1 tablet by mouth at bedtime.      . folic acid (FOLVITE) [id number] MCG tablet Take 400 mcg by mouth daily.      . folic acid (FOLVITE) [id number] MCG tablet Take 800 mcg by mouth daily.      . Multiple Vitamin (MULTIVITAMIN) capsule Take 1 capsule by  mouth daily.      . Multiple Vitamins-Minerals (PRESERVISION AREDS PO) Take 1 tablet by mouth every evening.      . Niacin (VITAMIN B-3 PO) Take 1 tablet by mouth daily.      Marland Kitchen SYNTHROID 125 MCG tablet Take 125 mcg by mouth daily.      Marland Kitchen enoxaparin (LOVENOX) 40 MG/0.4ML injection Inject 0.4 mLs (40 mg total) into the skin daily for 14 days. (Patient not taking: Reported on 10/14/2019) 14 Syringe 0  . ferrous sulfate 325 (65 FE) MG tablet Take 1 tablet (325 mg total) by mouth daily with breakfast. (Patient not taking: Reported on 09/07/2017) 30 tablet 0  . HYDROcodone-acetaminophen (NORCO/VICODIN) 5-325 MG tablet Take 1 tablet by mouth every 4 (four) hours as needed for moderate pain or severe pain. (Patient not taking: Reported on 09/07/2017) 30 tablet 0  . metoprolol tartrate  (LOPRESSOR) 25 MG tablet Take 0.5 tablets (12.5 mg total) by mouth 2 (two) times daily. (Patient not taking: Reported on 09/07/2017) 15 tablet 0  . polyethylene glycol (MIRALAX / GLYCOLAX) packet Take 17 g by mouth daily. (Patient not taking: Reported on 09/07/2017) 30 each 0  . senna (SENOKOT) 8.6 MG TABS tablet Take 1 tablet (8.6 mg total) by mouth 2 (two) times daily. (Patient not taking: Reported on 09/07/2017) 60 each 0      Home: Home Living Family/patient expects to be discharged to:: Private residence Living Arrangements: Spouse/significant other Available Help at Discharge: Family, Available 24 hours/day Type of Home: House Home Access: Stairs to enter [employer] of Steps: 4 Entrance Stairs-Rails: Right, Left, Can reach both Home Layout: Two level, Able to live on main level with bedroom/bathroom Alternate Level Stairs-Number of Steps: Flight Alternate Level Stairs-Rails: Right Bathroom Shower/Tub: Insurance claims handler: Civil engineer, contracting, Grab bars - tub/shower, Environmental consultant - 4 wheels  Functional History: Prior Function Level of Independence: Needs assistance Gait / Transfers Assistance Needed: Walks independently; assist needed recently to go up and down the flight of stairs to reach bedroom Comments: Had gone up and down the flight of steps in his home as recently as 2/26 Functional Status:  Mobility: Bed Mobility Overal bed mobility: Needs Assistance Bed Mobility: Rolling, Sidelying to Sit Rolling: Max assist Sidelying to sit: Max assist General bed mobility comments: Max assist and use fo bed pad to cradle hips for roll; Max assist to elevate trunk to sitting Transfers Overall transfer level: Needs assistance Equipment used: 2 person hand held assist Transfers: Sit to/from Stand Sit to Stand: Max assist, +2 physical assistance, +2 safety/equipment General transfer comment: Max assist to steady with rise to stand; [name] noted to have good knee and hip  extension to stand, however significant posterior lean, requiring Max assist to  shift weight anteriorly over feet Ambulation/Gait Assistive device: 2 person hand held assist General Gait Details: Attempted to march in place at EOB with +2 assist; multimodal cueing for weight shifts into stance R and L; able to lift R foot, unable to clear floor with L foot   ADL:   Cognition: Cognition Overall Cognitive Status: Impaired/Different from baseline Orientation Level: Oriented to person, Disoriented to place, Disoriented to time, Disoriented to situation Cognition Arousal/Alertness: Lethargic(But more awake once sitting EOB) Behavior During Therapy: WFL for tasks assessed/performed Overall Cognitive Status: Impaired/Different from baseline Area of Impairment: Orientation, Attention, Following commands, Safety/judgement, Awareness, Problem solving Orientation Level: Disoriented to, Place, Time, Situation Following Commands: Follows one step commands with increased time Problem Solving: Slow processing,  Decreased initiation, Difficulty sequencing, Requires verbal cues, Requires tactile cues General Comments: Made attempts at answering questions, however verbalizations were largely unintelligible   Blood pressure 116/64, pulse (!) 59, temperature 98.4 F (36.9 C), resp. rate 16, height 5\' 4"  (1.626 m), weight 53.5 kg, SpO2 95 %.   Physical Exam  Constitutional: He appears lethargic. He appears cachectic. He has a sickly appearance.  HEENT: Head is normocephalic, atraumatic, PERRLA, EOMI, sclera anicteric, ext ear canals clear Mouth/Throat: Mucous membranes are dry.  Neck: Supple without JVD or lymphadenopathy Heart: Reg rate and rhythm. No murmurs rubs or gallops Chest: CTA bilaterally without wheezes, rales, or rhonchi; no distress Abdomen: Soft, non-tender, non-distended, bowel sounds positive. Extremities: No clubbing, cyanosis, or edema. Pulses are 2+ Skin: Clean and intact without signs  of breakdown Neuro: Oriented to self only. Tangential speech in response to other questions. Moderate to severe dysarthria with language of confusion noted and answered yes to most questions. Able to follow simple motor commands with verbal/tactile cues.  Difficulty following manual muscle testing but grossly 4-/5 in upper extremities and 3/5 in lower extremities.  Musculoskeletal: Full ROM, No pain with AROM or PROM in the neck, trunk, or extremities. Posture appropriate Psych: Pt's affect is appropriate. Pt is cooperative   Lab Results Last 24 Hours       Results for orders placed or performed during the hospital encounter of 10/14/19 (from the past 24 hour(s))  Glucose, capillary     Status: None    Collection Time: 10/15/19 11:32 AM  Result Value Ref Range    Glucose-Capillary 90 70 - 99 mg/dL  CBC     Status: Abnormal    Collection Time: 10/16/19  3:03 AM  Result Value Ref Range    WBC 9.9 4.0 - 10.5 K/uL    RBC 3.64 (L) 4.22 - 5.81 MIL/uL    Hemoglobin 12.4 (L) 13.0 - 17.0 g/dL    HCT 37.3 (L) 39.0 - 52.0 %    MCV 102.5 (H) 80.0 - 100.0 fL    MCH 34.1 (H) 26.0 - 34.0 pg    MCHC 33.2 30.0 - 36.0 g/dL    RDW 19.9 (H) 11.5 - 15.5 %    Platelets 201 150 - 400 K/uL    nRBC 0.0 0.0 - 0.2 %  Basic metabolic panel     Status: Abnormal    Collection Time: 10/16/19  3:03 AM  Result Value Ref Range    Sodium 140 135 - 145 mmol/L    Potassium 3.3 (L) 3.5 - 5.1 mmol/L    Chloride 105 98 - 111 mmol/L    CO2 26 22 - 32 mmol/L    Glucose, Bld 107 (H) 70 - 99 mg/dL    BUN 23 8 - 23 mg/dL    Creatinine, Ser 0.95 0.61 - 1.24 mg/dL    Calcium 8.5 (L) 8.9 - 10.3 mg/dL    GFR calc non Af Amer >60 >60 mL/min    GFR calc Af Amer >60 >60 mL/min    Anion gap 9 5 - 15       Imaging Results (Last 48 hours)  EEG   Result Date: 10/15/2019 Lora Havens, MD     10/15/2019  1:08 PM Patient Name: Agapito Weinhardt MRN: [id number] Epilepsy Attending: Lora Havens Referring Physician/Provider:  Dr Kerney Elbe Date: 10/15/2019 Duration: 25.02 mins Patient history: 86 year old male with progressive confusion. MRI showed left temporal lobe is abnormal with increased signal on T2 and  FLAIR and cortical thickening and medially and anteriorly. There is a small area of non masslike enhancement in the left anterior temporal lobe. EEG to evaluate for seizure. Level of alertness: Lethargic, asleep AEDs during EEG study: LEV Technical aspects: This EEG study was done with scalp electrodes positioned according to the 10-20 International system of electrode placement. Electrical activity was acquired at a sampling rate of 500Hz  and reviewed with a high frequency filter of 70Hz  and a low frequency filter of 1Hz . EEG data were recorded continuously and digitally stored. DESCRIPTION: No clear posterior dominant rhythm was seen. Sleep was characterized by vertex waves, maximal frontocentral. EEG showed continuous generalized, maximal left temporal region, 3-6hz  theta-delta slowing. Frequent sharp waves were seen in left anterior temporal ( F7/T7) region. Hyperventilation and photic stimulation were not performed. ABNORMALITY - Sharp waves, left anterior temporal, maximal F7/T7 - Continuous slow, generalized and maximal left anterior temporal IMPRESSION: This study showed epileptogenicity and cortical dysfunction in left anterior temporal region. Additionally, there is evidence of mild to moderate diffuse encephalopathy, non specific to etiology. No seizures were seen throughout the recording. Dr Rory Percy was immediately notified. Lora Havens    DG Abd 1 View   Result Date: 10/15/2019 CLINICAL DATA:  Altered level of consciousness, MRI clearance EXAM: ABDOMEN - 1 VIEW COMPARISON:  None. FINDINGS: Supine frontal views of the abdomen and pelvis are obtained. Portions of the left hemidiaphragm are excluded by collimation. Cholecystectomy clips overlie right upper quadrant. Postsurgical changes from left hip arthroplasty  and right hip ORIF. No other metallic foreign bodies. Bowel gas pattern is unremarkable. Lung bases are clear. IMPRESSION: 1. Postsurgical changes as above. 2. Unremarkable bowel gas pattern. Electronically Signed   By: Randa Ngo M.D.   On: 10/15/2019 02:30    MR BRAIN WO CONTRAST   Result Date: 10/15/2019 CLINICAL DATA:  New diagnosis of dementia which is worsening recently. EXAM: MRI HEAD WITHOUT CONTRAST TECHNIQUE: Multiplanar, multiecho pulse sequences of the brain and surrounding structures were obtained without intravenous contrast. COMPARISON:  Head CT yesterday. FINDINGS: Brain: Diffusion imaging does not show any acute or subacute infarction. No focal abnormality affects the brainstem or cerebellum. Cerebral hemispheres show pronounced atrophy without lobar predominance. There are chronic small-vessel ischemic changes of the deep white matter. Ventricles appear in proportion to the sulci. There is confluent abnormal signal with sulcal thickening in the anterior and medial left temporal lobe. The appearance is worrisome for development of a primary brain neoplasm. Acute inflammation is felt less likely given this appearance, which does not show vasogenic edema. None the less, postcontrast imaging would be recommended to assess for enhancement within the lesion. Vascular: Major vessels at the base of the brain show flow. Skull and upper cervical spine: Negative Sinuses/Orbits: Clear/normal Other: None IMPRESSION: Generalized atrophy with chronic small-vessel ischemic changes of the white matter. Focal abnormality of the left anterior and medial temporal lobe with thickening and increased T2 signal. 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 Postcontrast imaging is recommended for further evaluation. Whereas the possibility of cerebritis does exist (herpes encephalitis), the pattern is much more suggestive of neoplastic infiltration. Electronically  Signed   By: Nelson Chimes M.D.   On: 10/15/2019 04:35    MR BRAIN W WO CONTRAST   Result Date: 10/15/2019 CLINICAL DATA:  Progressive confusion. Abnormal left temporal lobe on MRI. EXAM: MRI HEAD WITH CONTRAST TECHNIQUE: Multiplanar, multiecho pulse sequences of the brain and surrounding structures were obtained with intravenous  contrast. CONTRAST:  22mL GADAVIST GADOBUTROL 1 MMOL/ML IV SOLN COMPARISON:  Unenhanced MRI brain earlier today FINDINGS: Postcontrast images were obtained to the brain and are mildly degraded by motion. Left temporal lobe abnormality showed increased signal on T2 and FLAIR with thickening of the medial and anterior temporal cortex. There is mild enhancement of the left anterior temporal lobe without masslike features. Remainder of the thickening in the left temporal lobe does not show abnormal enhancement. Left temporal lobe is dilated but less so than that seen on the right. This is likely due to edema of the left temporal lobe. No other areas of abnormal enhancement. Generalized atrophy with ventricular enlargement as noted previously. IMPRESSION: Left temporal lobe is abnormal with increased signal on T2 and FLAIR and cortical thickening and medially and anteriorly. There is a small area of non masslike enhancement in the left anterior temporal lobe. The patient was febrile on admission. 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 Infiltrating tumor also in the differential. Electronically Signed   By: Franchot Gallo M.D.   On: 10/15/2019 09:11    Overnight EEG with video   Result Date: 10/16/2019 Lora Havens, MD     10/16/2019  8:34 AM Patient Name: Eric Larsen MRN: [id number] Epilepsy Attending: Lora Havens Referring Physician/Provider: Dr Amie [place] Duration: 10/15/2019 1257 to 2/29/2021 0830   Patient history: 86 year old male with progressive confusion. MRI showed left temporal lobe is abnormal with increased signal on T2 and FLAIR and cortical  thickening and medially and anteriorly. There is a small area of non masslike enhancement in the left anterior temporal lobe. EEG to evaluate for seizure.   Level of alertness: Lethargic, asleep   AEDs during EEG study: LEV   Technical aspects: This EEG study was done with scalp electrodes positioned according to the 10-20 International system of electrode placement. Electrical activity was acquired at a sampling rate of 500Hz  and reviewed with a high frequency filter of 70Hz  and a low frequency filter of 1Hz . EEG data were recorded continuously and digitally stored.   DESCRIPTION: No clear posterior dominant rhythm was seen. Sleep was characterized by vertex waves, maximal frontocentral. EEG showed continuous generalized, maximal left temporal region, 3-6hz  theta-delta slowing. Frequent sharp waves were seen in left anterior temporal ( F7/T7) region. Hyperventilation and photic stimulation were not performed.   ABNORMALITY - Sharp waves, left anterior temporal, maximal F7/T7 - Continuous slow, generalized and maximal left anterior temporal   IMPRESSION: This study showed epileptogenicity and cortical dysfunction in left anterior temporal region. Additionally, there is evidence of mild to moderate diffuse encephalopathy, non specific to etiology. No seizures were seen throughout the recording. 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 Does the need for close, 24 hr/day medical supervision in concert with the patient's rehab needs make it unreasonable for this patient to be served in a less intensive setting? Yes 2. Co-Morbidities requiring supervision/potential complications: hypothyroidism, hereditary spherocytosis, gait disorder, left shoulder and bilateral hip fracture in the past, recent diagnosis of dementia 3. Due to bladder management, bowel management, safety, skin/wound care, disease management, medication  administration, pain management and patient education, does the patient require 24 hr/day rehab nursing? Yes 4. Does the patient require coordinated care of a physician, rehab nurse, therapy disciplines of PT, OT, SLP to address physical and functional deficits in the context of the above medical diagnosis(es)? Yes Addressing deficits  in the following areas: balance, endurance, locomotion, strength, transferring, bowel/bladder control, bathing, dressing, feeding, grooming, toileting, cognition, speech, language, swallowing and psychosocial support 5. Can the patient actively participate in an intensive therapy program of at least 3 hrs of therapy per day at least 5 days per week? Yes 6. The potential for patient to make measurable gains while on inpatient rehab is excellent 7. Anticipated functional outcomes upon discharge from inpatient rehab are min assist  with PT, min assist with OT, supervision with SLP. 8. Estimated rehab length of stay to reach the above functional goals is: 16-20 days 9. Anticipated discharge destination: Home 10. Overall Rehab/Functional Prognosis: excellent   RECOMMENDATIONS: This patient's condition is appropriate for continued rehabilitative care in the following setting: CIR Patient has agreed to participate in recommended program. Yes Note that insurance prior authorization may be required for reimbursement for recommended care.   Comment: Mr. Pelon would be an excellent CIR candidate once medically cleared; plan for LP today and undergoing EEG monitoring to determine cause of encephalitis. He will have the support of his wife at home.   Thank you for this consult.   Bary Leriche, PA-C 10/16/2019    I have personally performed a face to face diagnostic evaluation, including, but not limited to relevant history and physical exam findings, of this patient and developed relevant assessment and plan.  Additionally, I have reviewed and concur with the physician  assistant's documentation above.   Leeroy Cha, MD

## 2019-10-18 NOTE — IPOC Note (Signed)
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) Active Problems:   Encephalitis   Hypoalbuminemia due to protein-calorie malnutrition ([place])   Microcytic anemia   Dysphagia     Functional Problem List: Nursing Behavior, Bladder, Bowel, Endurance, Medication Management, Nutrition, Pain, Safety, Skin Integrity  PT Balance, Safety, Endurance, Motor  OT Balance, Perception, Safety, Cognition, Behavior, Endurance, Sensory  SLP Cognition, Perception, Nutrition  TR         Basic ADL's: OT Eating, Grooming, Bathing, Dressing, Toileting     Advanced  ADL's: OT       Transfers: PT Bed Mobility, Bed to Chair, Car, Furniture, Floor  OT Toilet, Metallurgist: PT Ambulation, Stairs     Additional Impairments: OT None  SLP Swallowing, Communication, Social Cognition comprehension, expression Problem Solving, Memory, Attention, Awareness  TR      Anticipated Outcomes Item Anticipated Outcome  Self Feeding supervision  Swallowing  Mod A   Basic self-care  min assist  Toileting  min assist   Bathroom Transfers min assist  Bowel/Bladder  manage bowel and bladder with mod assist  Transfers  MinA LRAD  Locomotion  MinA LRAD  Communication  Mod A  Cognition  Mod A  Pain  no pain or less than 3  Safety/Judgment  remain free of falls, skin breakdown and infection   Therapy Plan: PT Intensity: Minimum of 1-2 x/day ,45 to 90 minutes PT Frequency: 5 out of 7 days PT Duration Estimated Length of Stay: 21-24 days OT Intensity: Minimum of 1-2 x/day, 45 to 90 minutes OT Frequency: 5 out of 7 days OT Duration/Estimated Length of Stay: 21-23 days SLP Intensity: Minumum of 1-2 x/day, 30 to 90 minutes SLP Frequency: 3 to 5 out of  7 days SLP Duration/Estimated Length of Stay: 3 weeks    Team Interventions: Nursing Interventions Patient/Family Education, Bladder Management, Bowel Management, Disease Management/Prevention, Pain Management, Skin Care/Wound Management, Dysphagia/Aspiration Precaution Training, Discharge Planning, Psychosocial Support, Cognitive Remediation/Compensation, Medication Management  PT interventions Ambulation/gait training, DME/adaptive equipment instruction, Neuromuscular re-education, Stair training, UE/LE Strength taining/ROM, Discharge planning, Skin care/wound management, Therapeutic Activities, UE/LE Coordination activities, Cognitive remediation/compensation, Disease management/prevention, Functional mobility training, Patient/family education, Splinting/orthotics, Therapeutic Exercise, Visual/perceptual remediation/compensation, Medical illustrator training  OT Interventions Balance/vestibular training, Discharge planning, Pain management, Self Care/advanced ADL retraining, Therapeutic Activities, Cognitive remediation/compensation, Disease mangement/prevention, Functional mobility training, Patient/family education, Therapeutic Exercise, Visual/perceptual remediation/compensation, DME/adaptive equipment instruction, Community reintegration, Neuromuscular re-education, UE/LE Strength taining/ROM  SLP Interventions Cognitive remediation/compensation, Speech/Language facilitation, Functional tasks, Patient/family education, Dysphagia/aspiration precaution training  TR Interventions    SW/CM Interventions Discharge Planning, Psychosocial Support, Patient/Family Education, Disease Management/Prevention   Barriers to Discharge MD  Medical stability and IV antiviral  Nursing Incontinence incontinent x 2  PT Decreased caregiver support, Incontinence    OT      SLP      SW Decreased caregiver support, Incontinence, Behavior Wife reports her husband can be stubborn and when he wants to do , he will  however if he does not, he will not coorperate   Team Discharge Planning: Destination: PT-Home ,OT-   , SLP-(TBD) Projected Follow-up: PT-Home health PT, 24 hour supervision/assistance, OT-  Home health OT, 24 hour supervision/assistance, SLP-(TBD) Projected Equipment Needs: PT-3 in 1 bedside comode, Rolling walker with 5" wheels, Tub/shower seat, OT-  To be determined, SLP-To be determined Equipment Details: PT-has SPC and transport chair at home, OT-  Patient/family involved in discharge planning: PT- Family member/caregiver,  OT-Patient, Family member/caregiver, SLP-Patient unable/family or caregive not available  MD ELOS: 14-17 days. Medical Rehab Prognosis:  Good Assessment: 86 year old male with history of osteoporosis, gait disorder, heriditary spherocytosis, recent diagnosis of dementia who was admitted on 10/14/2019 with intermittent confusion and progressive weakness x1 week.  Wife also reports 6 month history of problems with memory and weight loss. He was found to be febrile with temp of 102.6 and MRI brain done showing left anterior medial temporal lobe thickening and T2 hyperintensity gyri with edema and small nonmasslike enhancement left anterior temporal lobe.  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  Empiric IV acyclovir was initiated.  TSH- 2.289 and levothyroxine increased to 125 mcg/day. EEG done showing sharp waves in left anterior temporal lesion with mild to moderate diffuse encephalopathy.  He was loaded with Keppra but continued to have issues with confusion and bouts of lethargy.  Hospital course further complicated by dysphagia due to tendency to hold orals and therefore he was placed on a dysphagia 1, thin liquid diet.  LT-EEG was negative for seizures but did show epileptogenicity and is to continue Keppra.  Blood cultures x2 done in 1+ for staph felt to be contaminant. LP was ordered for work-up and showed elevated protein-139 and lymphocytosis with WBC 84.  Culture  is currently pending and to continue on IV acyclovir for encephalopathy due to HSV encephalitis.  Patient with resultant cognitive deficits with unintelligible language, difficulty following commands, and significant weakness requiring STEDY to stand.  Will set goals for Min A with PT/OT and Mod A with SLP.     Due to the current state of emergency, patients may not be receiving their 3-hours of Medicare-mandated therapy.  See Team Conference Notes for weekly updates to the plan of care

## 2019-10-18 NOTE — Progress Notes (Addendum)
  Speech Language Pathology Treatment: Dysphagia  Patient Details Name: Eric Larsen MRN: [id number] DOB: 10/17/1930 Today's Date: 10/18/2019 Time: [id number] SLP Time Calculation (min) (ACUTE ONLY): 26 min  Assessment / Plan / Recommendation Clinical Impression  Pt encountered sitting upright in chair for skilled ST targeting swallowing. Pt resting upon entry, but able to be roused to participate in this session. Wife present at bedside. Today's session with a focus on diet texture analysis, compensatory strategy training and family education. Per wife, patient tolerating dysphagia 1 solids "very well." She denies observance of any coughing or choking with puree solids or thin liquids.  Pt with some lower dentition, missing upper (per wife, upper denture plate is currently lost). Pt seen with ice chips and thin liquids via straw and cup sips: adequate oral phase, wet voice quality noted after the first cup swallow, this resolved with subsequent swallows.   Pt seen with puree solids (applesauce): oral phase notable for some bolus holding, but patient initiated the swallow with fading verbal cues to do so. Adequate oral clearance achieved. ST trialed upgraded solid texture with very small diced peaches, patient with moderately prolonged mastication, some difficulty with bolus cohesion and residue primarily on central sulcus of lingual surface following the swallow. Pt appeared unaware of the residue, but able to clear it with liquid rinse. Reduced bolus awareness and waxing/waning attention present as concerns for upgrading diet texture at this time. Pt able to follow commands to perform lingual sweep for oral clearance. D/w wife re: various textures and the differences between them. Wife verbalized understanding, stating patient seems to be doing well and like puree solids. Recommend that patient continue dysphagia 1 solids and thin liquids, ensure alertness for PO intake, check oral cavity for  pocketing, meds crushed in puree. Wife edu re: compensatory strategies for swallow. ST to follow briefly for diet tolerance and appropriateness for texture upgrade.    HPI HPI: Eric Larsen is an  86 y.o. yo male w/ PMH significant for hypothyroidism, hyperlipidemia, hereditary spherocytosis and recent diagnosis of dementia presenting with one week of progressively worsening confusion.  MRI brain on 2/28 reported: "Left temporal lobe is abnormal with increased signal on T2 and FLAIR and cortical thickening and medially and anteriorly. There is a small area of non masslike enhancement in the left anterior temporal lobe. The patient was febrile on admission. 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 Infiltrating tumor also in the differential.      SLP Plan  Continue with current plan of care       Recommendations  Diet recommendations: Dysphagia 1 (puree);Thin liquid Medication Administration: Crushed with puree Supervision: Full supervision/cueing for compensatory strategies Compensations: Minimize environmental distractions;Slow rate;Small sips/bites Postural Changes and/or Swallow Maneuvers: Seated upright 90 degrees                Oral Care Recommendations: Oral care BID Follow up Recommendations: 24 hour supervision/assistance SLP Visit Diagnosis: Dysphagia, unspecified (R13.10) Plan: Continue with current plan of care       [place] 10/18/2019, 1:38 PM  Marina Goodell, M.Ed., [place] [phone]: Acute Rehab office [phone] - pager

## 2019-10-18 NOTE — Progress Notes (Signed)
Placed on Tele- monitoring per standard protocol refer to documentation

## 2019-10-18 NOTE — Progress Notes (Signed)
Transfer Note:   Traveling Method:Bed Transferring Unit: CIR  Mental Orientation: Alert Telemetry:  Assessment:  Skin:  IV: PICC RUA Pain:  Tubes: Safety Measures: Safety Fall Prevention Plan has been given, discussed and signed Admission: Completed 56M Orientation: Patient has been orientated to the room, unit and staff.  Family:  Transferring Incident:   Orders have been reviewed and implemented. Will continue to monitor the patient. Call light has been placed within reach and bed alarm has been activated.  Shela Commons, RN

## 2019-10-18 NOTE — Progress Notes (Addendum)
Patient and wife arrived to unit and given orientation to rehab procedures. Patient alert and responding to voice but sleepy. Tray brought down from prior floor but patient not wanting to eat at this time. No complaints at this time.   Skin assessment - red area (blanchable) noted on inner ankle of L foot, also red blanchable area over bony prominence spine mid back, foam placed. Foam placed on buttocks for protection.

## 2019-10-18 NOTE — Discharge Summary (Signed)
Name: Eric Larsen MRN: [id number] DOB: 1931-06-09 86 y.o. PCP: Eric Gene, MD  Date of Admission: 10/14/2019  6:09 AM Date of Discharge: 10/16/19 Attending Physician: Lucious Groves, DO  Discharge Diagnosis: 1. HSV encephalitis 2. Functional decline due to HSV encephalitis  Discharge Medications: Allergies as of 10/18/2019   No Known Allergies      Medication List     STOP taking these medications    acetaminophen 325 MG tablet Commonly known as: TYLENOL   enoxaparin 40 MG/0.4ML injection Commonly known as: LOVENOX   HYDROcodone-acetaminophen 5-325 MG tablet Commonly known as: NORCO/VICODIN   metoprolol tartrate 25 MG tablet Commonly known as: LOPRESSOR       TAKE these medications    acyclovir 500 mg in dextrose 5 % 100 mL Inject 500 mg into the vein every 12 (twelve) hours for 11 days.   calcium-vitamin D 500-200 MG-UNIT Tabs tablet Commonly known as: OSCAL WITH D Take 1 tablet by mouth 2 (two) times daily with a meal.   Cholecalciferol 25 MCG (1000 UT) Chew Chew 2,000 Units by mouth daily.   donepezil 5 MG tablet Commonly known as: ARICEPT Take 1 tablet by mouth at bedtime.   ferrous sulfate 325 (65 FE) MG tablet Take 1 tablet (325 mg total) by mouth daily with breakfast.   folic acid [id number] MCG tablet Commonly known as: FOLVITE Take 800 mcg by mouth daily.   folic acid [id number] MCG tablet Commonly known as: FOLVITE Take 400 mcg by mouth daily.   levETIRAcetam 500 MG/100ML Soln Commonly known as: KEPRRA Inject 100 mLs (500 mg total) into the vein every 12 (twelve) hours.   multivitamin capsule Take 1 capsule by mouth daily.   polyethylene glycol 17 g packet Commonly known as: MIRALAX / GLYCOLAX Take 17 g by mouth daily.   PRESERVISION AREDS PO Take 1 tablet by mouth every evening.   senna 8.6 MG Tabs tablet Commonly known as: SENOKOT Take 1 tablet (8.6 mg total) by mouth 2 (two) times daily.   Synthroid 125 MCG  tablet Generic drug: levothyroxine Take 125 mcg by mouth daily.   VITAMIN B-3 PO Take 1 tablet by mouth daily.        Disposition and follow-up:   Mr.Eric Larsen was discharged from [facility] in Stable condition.  At the hospital follow up visit please address:  1.  HSV encephalitis. Please ensure completion of 14d course of acyclovir. 2.  Deconditioning. Please ensure continued rehabilitation.    Hospital Course: 86 yo gentleman who presented with one week history of progressive confusion, urinary incontinence and generalized weakness. He was noted to be febrile on admission. Brain MRI revealed temporal lobe findings consistent with HSV encephalitis. EEG showed epileptiform behavior in the temporal lobe however he did not have any known seizures during his hospitalization. He was started empirically on Acyclovir IV and Keppra. He underwent a lumbar puncture on 10/16/19 which returned positive for HSV-1 on day of discharge. Due to extended IV antiviral requirement for treatment, PICC line will be placed prior to discharge to inpatient rehabilitation. Will require 3w of acyclovir for treatment as recommended by ID. He was evaluated by PT during his hospitalization who recommends inpatient rehabilitation due to functional decline. Additionally, his hospital course was complicated by dysphagia which was evaluated by ST who recommends a dysphagia 1 diet.   Discharge Vitals:   BP 92/77 (BP Location: Left Arm)   Pulse 73   Temp 97.6 F (36.4 C) (Oral)  Resp 15   Ht 5\' 4"  (1.626 m)   Wt 53.5 kg   SpO2 97%   BMI 20.25 kg/m   Pertinent Labs, Studies, and Procedures:  10/15/19 MRI brain: Left temporal lobe is abnormal with increased signal on T2 and FLAIR and cortical thickening and medially and anteriorly. There is a small area of non masslike enhancement in the left anterior temporal lobe.  EEG: This study showed epileptogenicity and cortical dysfunction in left  anterior temporal region.     SignedMitzi Hansen, MD 10/18/2019, 1:12 PM   Pager: [phone]

## 2019-10-19 ENCOUNTER — Inpatient Hospital Stay (HOSPITAL_COMMUNITY): Payer: Medicare Other

## 2019-10-19 ENCOUNTER — Inpatient Hospital Stay (HOSPITAL_COMMUNITY): Payer: Medicare Other | Admitting: Speech Pathology

## 2019-10-19 ENCOUNTER — Inpatient Hospital Stay (HOSPITAL_COMMUNITY): Payer: Medicare Other | Admitting: Occupational Therapy

## 2019-10-19 DIAGNOSIS — R1310 Dysphagia, unspecified: Secondary | ICD-10-CM

## 2019-10-19 DIAGNOSIS — B004 Herpesviral encephalitis: Secondary | ICD-10-CM

## 2019-10-19 DIAGNOSIS — E039 Hypothyroidism, unspecified: Secondary | ICD-10-CM

## 2019-10-19 DIAGNOSIS — R9401 Abnormal electroencephalogram [EEG]: Secondary | ICD-10-CM

## 2019-10-19 DIAGNOSIS — E46 Unspecified protein-calorie malnutrition: Secondary | ICD-10-CM

## 2019-10-19 DIAGNOSIS — D509 Iron deficiency anemia, unspecified: Secondary | ICD-10-CM

## 2019-10-19 DIAGNOSIS — E8809 Other disorders of plasma-protein metabolism, not elsewhere classified: Secondary | ICD-10-CM

## 2019-10-19 LAB — CSF CULTURE W GRAM STAIN
Culture: NO GROWTH
Special Requests: NORMAL

## 2019-10-19 LAB — COMPREHENSIVE METABOLIC PANEL
ALT: 24 U/L (ref 0–44)
AST: 27 U/L (ref 15–41)
Albumin: 3 g/dL — ABNORMAL LOW (ref 3.5–5.0)
Alkaline Phosphatase: 48 U/L (ref 38–126)
Anion gap: 6 (ref 5–15)
BUN: 15 mg/dL (ref 8–23)
CO2: 28 mmol/L (ref 22–32)
Calcium: 9 mg/dL (ref 8.9–10.3)
Chloride: 104 mmol/L (ref 98–111)
Creatinine, Ser: 0.81 mg/dL (ref 0.61–1.24)
GFR calc Af Amer: >60 mL/min (ref >60)
GFR calc non Af Amer: >60 mL/min (ref >60)
Glucose, Bld: 106 mg/dL — ABNORMAL HIGH (ref 70–99)
Potassium: 3.6 mmol/L (ref 3.5–5.1)
Sodium: 138 mmol/L (ref 135–145)
Total Bilirubin: 2.8 mg/dL — ABNORMAL HIGH (ref 0.3–1.2)
Total Protein: 5.5 g/dL — ABNORMAL LOW (ref 6.5–8.1)

## 2019-10-19 LAB — CBC WITH DIFFERENTIAL/PLATELET
Abs Immature Granulocytes: 0.08 10*3/uL — ABNORMAL HIGH (ref 0.00–0.07)
Basophils Absolute: 0.1 10*3/uL (ref 0.0–0.1)
Basophils Relative: 1 %
Eosinophils Absolute: 0.2 10*3/uL (ref 0.0–0.5)
Eosinophils Relative: 2 %
HCT: 34.8 % — ABNORMAL LOW (ref 39.0–52.0)
Hemoglobin: 11.8 g/dL — ABNORMAL LOW (ref 13.0–17.0)
Immature Granulocytes: 1 %
Lymphocytes Relative: 11 %
Lymphs Abs: 1.1 10*3/uL (ref 0.7–4.0)
MCH: 33.3 pg (ref 26.0–34.0)
MCHC: 33.9 g/dL (ref 30.0–36.0)
MCV: 98.3 fL (ref 80.0–100.0)
Monocytes Absolute: 0.7 10*3/uL (ref 0.1–1.0)
Monocytes Relative: 7 %
Neutro Abs: 8 10*3/uL — ABNORMAL HIGH (ref 1.7–7.7)
Neutrophils Relative %: 78 %
Platelets: 221 10*3/uL (ref 150–400)
RBC: 3.54 MIL/uL — ABNORMAL LOW (ref 4.22–5.81)
RDW: 19.3 % — ABNORMAL HIGH (ref 11.5–15.5)
WBC: 10.1 10*3/uL (ref 4.0–10.5)
nRBC: 0 % (ref 0.0–0.2)

## 2019-10-19 LAB — CULTURE, BLOOD (ROUTINE X 2)
Culture: NO GROWTH
Special Requests: ADEQUATE

## 2019-10-19 LAB — VITAMIN B1: Vitamin B1 (Thiamine): 176.1 nmol/L (ref 66.5–200.0)

## 2019-10-19 MED ORDER — NON FORMULARY: 0.3000 mg | Freq: Every day | Status: DC

## 2019-10-19 MED ORDER — PRO-STAT SUGAR FREE PO LIQD
30.0000 mL | Freq: Two times a day (BID) | ORAL | Status: DC
  Administered 2019-10-19 – 2019-11-01 (×26): 30 mL via ORAL
  Filled 2019-10-19 (×27): qty 30

## 2019-10-19 MED ORDER — MELATONIN 3 MG PO TABS
3.0000 mg | ORAL_TABLET | Freq: Every day | ORAL | Status: DC
  Administered 2019-10-19 – 2019-10-29 (×11): 3 mg via ORAL
  Filled 2019-10-19 (×14): qty 1

## 2019-10-19 MED ORDER — DEXTROSE 5 % IV SOLN
500.0000 mg | Freq: Three times a day (TID) | INTRAVENOUS | Status: DC
  Administered 2019-10-20 – 2019-10-30 (×31): 500 mg via INTRAVENOUS
  Filled 2019-10-19 (×34): qty 10

## 2019-10-19 NOTE — Evaluation (Signed)
Speech Language Pathology Assessment and Plan  Patient Details  Name: Ary Lavine MRN: [id number] Date of Birth: [birth year]  SLP Diagnosis: Cognitive Impairments;Speech and Language deficits;Dysphagia  Rehab Potential: Fair ELOS: 3 weeks 3 weeks   Today's Date: 10/19/2019 SLP Individual Time: 9292-4462 SLP Individual Time Calculation (min): 60 min   Problem List:  Patient Active Problem List   Diagnosis Date Noted  . Hypoalbuminemia due to protein-calorie malnutrition ([place])   . Microcytic anemia   . Dysphagia   . Encephalitis 10/18/2019  . Abnormal EEG   . Hypothyroidism   . Macrocytic anemia   . 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 Hip fracture ([place]) 08/12/2017  . Hallux valgus of left foot 04/28/2017  . Hammer toe of left foot 04/28/2017  . Corn of toe 04/28/2017  . History of total left hip replacement 09/19/2015  . Spherocytosis (familial) ([place]) 09/17/2015  . Mixed hyperlipidemia 06/19/2015  . Benign prostatic hyperplasia with lower urinary tract symptoms 06/19/2015  . History of fracture of vertebra 11/06/2014  . History of colon polyps 08/08/2014  . Pathological fracture 11/01/2013  . Toenail fungus 01/20/2013  . Vitamin D deficiency 09/27/2012  . Primary hypothyroidism 09/27/2012  . Osteoporosis 09/27/2012  . Spherocytosis ([place]) 12/04/2011  . Skin cancer 12/04/2011   Past Medical History:  Past Medical History:  Diagnosis Date  . Hypothyroidism   . Osteoporosis   . Thyroid disease    Past Surgical History:  Past Surgical History:  Procedure Laterality Date  . CHOLECYSTECTOMY    . HIP FRACTURE SURGERY    . INTRAMEDULLARY (IM) NAIL INTERTROCHANTERIC Right 08/13/2017   Procedure: INTRAMEDULLARY (IM) NAIL INTERTROCHANTRIC;  Surgeon: Earnestine Leys, MD;  Location: [facility] ORS;  Service: Orthopedics;  Laterality: Right;  . SHOULDER SURGERY      Assessment / Plan / Recommendation Clinical Impression Zyrus Hetland is an  86 year old male with history of osteoporosis, gait disorder, heriditary spherocytosis, with recent diagnosis of dementia (10/10/19). Pt admitted 10/14/2019 with intermittent confusion and progressive weakness x1 week.  History taken from wife due to cognition.  Wife also reports 6 month history of problems with memory and weight loss d/t choking with food items.  He was found to be febrile with temp of 102.6 and MRI brain done showing left anterior medial temporal lobe thickening and T2 hyperintensity gyri with edema and small nonmasslike enhancement left anterior temporal lobe.  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   EEG done showing sharp waves in left anterior temporal lesion with mild to moderate diffuse encephalopathy.  He was loaded with Keppra but continued to have issues with confusion and bouts of lethargy.  Hospital course further complicated by dysphagia due to tendency to hold orals and therefore he was placed on a dysphagia 1, thin liquid diet.    Patient with resultant cognitive deficits with unintelligible language, difficulty following commands, and significant weakness requiring STEDY to stand.  CIR recommended due to functional decline and was admitted to CIR on 10/18/19.   Comprehensive cognitive linguistic evaluation and bedside swallow evaluations were completed on 10/19/19.   It is important to note that pt was has been experiencing memory deficits for > 1 year. On 10/19/19, pt was dx with moderate dementia as he scored 18 out 20 on SLUMS. Pt's cognitive deficits are more severe at this time. He now presents with severe cognitive deficits in sustained attention, task initiation, basic problem solving, orientation and his responses are mumbled and perseverative. Uncertain at  this time if speech-language deficits are related to overall confused state or perhaps related to true language impairments. Will need ongoing assessments as mentation clears.   During the bedside swallow  evaluation, pt presented with immediate throat clears when consuming thin liquids via cup and puree. Pt also demonstrates oral phase impairments c/b prolonged mastication that is related to inattention to bolus. Recommend instrumental swallow study to assess any impairments in swallow, assess airway protection and recommend least restrictive diet. Of note, pt has history of choking prior to admission and was only consuming protein drinks d/t choking on solids foods.   Skilled St is required to target the above mentioned deficits, increase functional independence and reduce caregiver burden.    Skilled Therapeutic Interventions          See the above results from evaluation.   SLP Assessment  Patient will need skilled Speech Lanaguage Pathology Services during CIR admission    Recommendations  SLP Diet Recommendations: Dysphagia 1 (Puree);Thin Liquid Administration via: Cup;Straw Medication Administration: Crushed with puree Supervision: Full supervision/cueing for compensatory strategies;Staff to assist with self feeding Compensations: Minimize environmental distractions;Slow rate;Small sips/bites Postural Changes and/or Swallow Maneuvers: Seated upright 90 degrees Oral Care Recommendations: Oral care BID Recommendations for Other Services: Neuropsych consult Patient destination: (TBD) Follow up Recommendations: (TBD) Equipment Recommended: To be determined    SLP Frequency 3 to 5 out of 7 days   SLP Duration  SLP Intensity  SLP Treatment/Interventions 3 weeks  Minumum of 1-2 x/day, 30 to 90 minutes  Cognitive remediation/compensation;Speech/Language facilitation;Functional tasks;Patient/family education;Dysphagia/aspiration precaution training    Pain Pain Assessment Pain Scale: 0-10 Pain Score: 0-No pain  Prior Functioning Cognitive/Linguistic Baseline: Baseline deficits Baseline deficit details: progressive memory deficits > 1 year, recent dx of moderate dementia  (10/10/19) Type of Home: House  Lives With: Spouse Available Help at Discharge: Family;Available 24 hours/day Vocation: Retired  Programmer, systems Overall Cognitive Status: Impaired/Different from baseline Arousal/Alertness: Awake/alert Orientation Level: Oriented to person Attention: Focused Focused Attention: Impaired Memory: Impaired Memory Impairment: Decreased short term memory;Storage deficit;Retrieval deficit;Decreased recall of new information Decreased Short Term Memory: Verbal basic;Functional basic Awareness: Impaired Awareness Impairment: Intellectual impairment Problem Solving: Impaired Problem Solving Impairment: Verbal basic;Functional basic Executive Function: (all impacted by lower level deficits) Safety/Judgment: Impaired  Comprehension Auditory Comprehension Overall Auditory Comprehension: Impaired Yes/No Questions: Impaired Basic Biographical Questions: 0-25% accurate Basic Immediate Environment Questions: 0-24% accurate Commands: Impaired One Step Basic Commands: 0-24% accurate Visual Recognition/Discrimination Discrimination: Not tested Reading Comprehension Reading Status: Not tested Expression Expression Primary Mode of Expression: Verbal Verbal Expression Overall Verbal Expression: Impaired Initiation: Impaired Written Expression Dominant Hand: Right Written Expression: Not tested Oral Motor Oral Motor/Sensory Function Overall Oral Motor/Sensory Function: Within functional limits    Bedside Swallowing Assessment General Date of Onset: 10/15/19 Previous Swallow Assessment: BSSE on acute Diet Prior to this Study: Dysphagia 1 (puree);Thin liquids Temperature Spikes Noted: No Respiratory Status: Room air History of Recent Intubation: No Behavior/Cognition: Requires cueing;Lethargic/Drowsy;Distractible;Confused;Doesn't follow directions Oral Cavity - Dentition: Missing dentition Self-Feeding Abilities: Total assist Patient  Positioning: Upright in bed Baseline Vocal Quality: Normal Volitional Cough: Cognitively unable to elicit Volitional Swallow: Unable to elicit  Oral Care Assessment   Ice Chips Ice chips: Within functional limits Presentation: Spoon Thin Liquid Thin Liquid: Impaired Presentation: Cup;Straw Pharyngeal  Phase Impairments: Throat Clearing - Immediate;Suspected delayed Swallow;Wet Vocal Quality Nectar Thick Nectar Thick Liquid: Not tested Honey Thick Honey Thick Liquid: Not tested Puree Puree: Impaired Presentation: Spoon Oral Phase Impairments: Impaired mastication;Poor  awareness of bolus;Reduced lingual movement/coordination Oral Phase Functional Implications: Prolonged oral transit;Oral holding Pharyngeal Phase Impairments: Throat Clearing - Immediate;Suspected delayed Swallow Solid Solid: Not tested BSE Assessment Risk for Aspiration Impact on safety and function: Moderate aspiration risk Other Related Risk Factors: Cognitive impairment;Lethargy  Short Term Goals: Week 1: SLP Short Term Goal 1 (Week 1): Pt will sustain attention to basic task for 3 minutes with Max A cues. SLP Short Term Goal 2 (Week 1): Pt will initiate basic familiar tasks in 2 out of 5 opportunities with Max A cues. SLP Short Term Goal 3 (Week 1): Pt will follow 1 step directions in 2 out of 5 opportunities with Max A cues. SLP Short Term Goal 4 (Week 1): Pt will participate in basic problem solving tasks targeting basic ADLs with Max A cues. SLP Short Term Goal 5 (Week 1): Pt will consume least restrictive diet with minimal s/s of dsyphagia and aspiration.  Refer to Care Plan for Long Term Goals  Recommendations for other services: Neuropsych  Discharge Criteria: Patient will be discharged from SLP if patient refuses treatment 3 consecutive times without medical reason, if treatment goals not met, if there is a change in medical status, if patient makes no progress towards goals or if patient is  discharged from hospital.  The above assessment, treatment plan, treatment alternatives and goals were discussed and mutually agreed upon: No family available/patient unable  Meliya Mcconahy 10/19/2019, 12:15 PM

## 2019-10-19 NOTE — Progress Notes (Signed)
[facility] PHYSICAL MEDICINE & REHABILITATION PROGRESS NOTE  Subjective/Complaints: Patient seen sitting up in bed this morning.  Per report, noted to be agitated at the beginning of the night-telesitter ordered..  No reported issues thereafter.  ROS: Unable to assess due to cognition.  Objective: Vital Signs: Blood pressure 123/75, pulse 72, temperature 98.1 F (36.7 C), resp. rate 18, height 5\' 4"  (1.626 m), weight 57.2 kg, SpO2 99 %. [place] EKG SITE RITE  Result Date: 10/18/2019 If Site Rite image not attached, placement could not be confirmed due to current cardiac rhythm.  Recent Labs    10/17/19 0506 10/19/19 0516  WBC 9.3 10.1  HGB 13.3 11.8*  HCT 38.6* 34.8*  PLT 231 221   Recent Labs    10/17/19 0506 10/19/19 0516  NA 138 138  K 3.8 3.6  CL 105 104  CO2 26 28  GLUCOSE 117* 106*  BUN 20 15  CREATININE 0.89 0.81  CALCIUM 8.7* 9.0    Physical Exam: BP 123/75 (BP Location: Left Arm)   Pulse 72   Temp 98.1 F (36.7 C)   Resp 18   Ht 5\' 4"  (1.626 m)   Wt 57.2 kg   SpO2 99%   BMI 21.63 kg/m  Constitutional: No distress . Vital signs reviewed. Frail.  HENT: Normocephalic.  Atraumatic. Eyes: EOMI. No discharge. Cardiovascular: No JVD. Respiratory: Normal effort.  No stridor. GI: Non-distended. Skin: Warm and dry.  Intact. Psych: Unable to assess due to mentation.  Slowed. Musc: No edema in extremities.  No tenderness in extremities. Neurological: He is alert.  Inconsistently follows demonstrated commands with increased time Motor: Grossly 4+/5 throughout   Assessment/Plan: 1. 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  Physiatrist is providing close team supervision and 24 hour management of active medical problems listed below.  Physiatrist and rehab team continue to assess barriers to discharge/monitor patient progress toward functional and  medical goals  Care Tool:  Bathing              Bathing assist       Upper Body Dressing/Undressing Upper body dressing   What is the patient wearing?: Hospital gown only    Upper body assist Assist Level: Moderate Assistance - Patient 50 - 74%    Lower Body Dressing/Undressing Lower body dressing            Lower body assist       Toileting Toileting    Toileting assist Assist for toileting: Moderate Assistance - Patient 50 - 74%     Transfers Chair/bed transfer  Transfers assist           Locomotion Ambulation   Ambulation assist              Walk 10 feet activity   Assist           Walk 50 feet activity   Assist           Walk 150 feet activity   Assist           Walk 10 feet on uneven surface  activity   Assist           Wheelchair     Assist               Wheelchair 50 feet with 2 turns activity    Assist  Wheelchair 150 feet activity     Assist            Medical Problem List and Plan: 1.  Cognitive deficits with unintelligible language, weakness, secondary to herpes encephalitis.  Begin CIR evaluations             Cont IV acyclovir 2.  Antithrombotics: -DVT/anticoagulation:  Pharmaceutical: Lovenox             -antiplatelet therapy: NA 3. Pain Management: PRN medications 4. Mood: Team support             -antipsychotic agents: NA 5. Neuropsych: This patient is not capable of making decisions on his own behalf.  Telesitter for safety 6. Skin/Wound Care: Routine pressure relief measures.  7. Fluids/Electrolytes/Nutrition: Monitor I/O.   BMP within acceptable range on 3/4 8. Hypothyroid: Continue Synthroid 134mcg. 9. Epileptogenic potential: Continue Keppra 500 mg bid for seizure  No breakthrough seizures from admission 10. Hypokalemia: Resolved 11. Macrocytic anemia:   Hemoglobin 11.8 on 3/4  Continue to monitor 12.  Hypoalbuminemia  Supplement  initiated on 3/4 13.  Dysphagia  D1 thins, advance diet as tolerated  LOS: 1 days A FACE TO FACE EVALUATION WAS PERFORMED  Seba Madole Lorie Phenix 10/19/2019, 8:32 AM

## 2019-10-19 NOTE — Care Management (Addendum)
[facility] Individual Statement of Services  Patient Name:  Eric Larsen  Date:  10/19/2019  Welcome to the [name].  Our goal is to provide you with an individualized program based on your diagnosis and situation, designed to meet your specific needs.  With this comprehensive rehabilitation program, you will be expected to participate in at least 3 hours of rehabilitation therapies Monday-Friday, with modified therapy programming on the weekends.  Your rehabilitation program will include the following services:  Physical Therapy (PT), Occupational Therapy (OT), Speech Therapy (ST), 24 hour per day rehabilitation nursing, Therapeutic Recreaction (TR), Neuropsychology, Case Management (Social Worker), Rehabilitation Medicine, Nutrition Services and Pharmacy Services  Weekly team conferences will be held on Wednesdays to discuss your progress.  Your Social Industrial/product designer will talk with you frequently to get your input and to update you on team discussions.  Team conferences with you and your family in attendance may also be held.  Expected length of stay: 21 days Overall anticipated outcome:  Minimal assistance  Depending on your progress and recovery, your program may change. Your Social Industrial/product designer will coordinate services and will keep you informed of any changes. Your Social Worker's/Care Manager's name and contact numbers are listed  below.  The following services may also be recommended but are not provided by the [name]:    [name] will be made to provide these services after discharge if needed.  Arrangements include referral to agencies that provide these services.  Your insurance has been verified to be:  Medicare Your primary doctor is:  Jennette Kettle, MD  Pertinent information will be shared with your doctor  and your insurance company.  Social Worker:  Loralee Pacas, [place] or ([phone] Care Manager: Dorien Chihuahua, RN [phone] or [phone]  Information discussed with and copy given to patient by: Margarito Liner, 10/19/2019, 9:22 AM

## 2019-10-19 NOTE — Evaluation (Signed)
Occupational Therapy Assessment and Plan  Patient Details  Name: Eric Larsen MRN: [id number] Date of Birth: 19-Dec-1930  OT Diagnosis: abnormal posture, altered mental status, cognitive deficits and muscle weakness (generalized) Rehab Potential: Rehab Potential (ACUTE ONLY): Good ELOS: 21-23 days   Today's Date: 10/19/2019 OT Individual Time: 2248-2500 OT Individual Time Calculation (min): 58 min     Problem List:  Patient Active Problem List   Diagnosis Date Noted  . Hypoalbuminemia due to protein-calorie malnutrition ([place])   . Microcytic anemia   . Dysphagia   . Encephalitis 10/18/2019  . Abnormal EEG   . Hypothyroidism   . Macrocytic anemia   . 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 Hip fracture ([place]) 08/12/2017  . Hallux valgus of left foot 04/28/2017  . Hammer toe of left foot 04/28/2017  . Corn of toe 04/28/2017  . History of total left hip replacement 09/19/2015  . Spherocytosis (familial) ([place]) 09/17/2015  . Mixed hyperlipidemia 06/19/2015  . Benign prostatic hyperplasia with lower urinary tract symptoms 06/19/2015  . History of fracture of vertebra 11/06/2014  . History of colon polyps 08/08/2014  . Pathological fracture 11/01/2013  . Toenail fungus 01/20/2013  . Vitamin D deficiency 09/27/2012  . Primary hypothyroidism 09/27/2012  . Osteoporosis 09/27/2012  . Spherocytosis ([place]) 12/04/2011  . Skin cancer 12/04/2011    Past Medical History:  Past Medical History:  Diagnosis Date  . Hypothyroidism   . Osteoporosis   . Thyroid disease    Past Surgical History:  Past Surgical History:  Procedure Laterality Date  . CHOLECYSTECTOMY    . HIP FRACTURE SURGERY    . INTRAMEDULLARY (IM) NAIL INTERTROCHANTERIC Right 08/13/2017   Procedure: INTRAMEDULLARY (IM) NAIL INTERTROCHANTRIC;  Surgeon: Earnestine Leys, MD;  Location: [facility] ORS;  Service: Orthopedics;  Laterality: Right;  . SHOULDER SURGERY      Assessment & Plan Clinical  Impression: Patient is a 86 y.o. year old male with recent admission to the hospital on 10/14/2019 with intermittent confusion and progressive weakness x1 week.  History taken from wife due to cognition.  Wife also reports 6 month history of problems with memory and weight loss. He was found to be febrile with temp of 102.6 and MRI brain done showing left anterior medial temporal lobe thickening and T2 hyperintensity gyri with edema and small nonmasslike enhancement left anterior temporal lobe.  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  Empiric IV acyclovir was initiated.  TSH- 2.289 and levothyroxine increased to 125 mcg/day. EEG done showing sharp waves in left anterior temporal lesion with mild to moderate diffuse encephalopathy.  He was loaded with Keppra but continued to have issues with confusion and bouts of lethargy.  Patient transferred to CIR on 10/18/2019 .    Patient currently requires total with basic self-care skills secondary to muscle weakness, decreased midline orientation, decreased initiation, decreased attention, decreased awareness, decreased problem solving, decreased safety awareness, decreased memory and delayed processing and decreased sitting balance, decreased standing balance, decreased postural control and decreased balance strategies.  Prior to hospitalization, patient could complete ADLs with independent .  Patient will benefit from skilled intervention to decrease level of assist with basic self-care skills and increase independence with basic self-care skills prior to discharge home with care partner.  Anticipate patient will require minimal physical assistance and follow up home health.  OT - End of Session Activity Tolerance: Tolerates 30+ min activity with multiple rests Endurance Deficit: Yes Endurance Deficit Description: Pt reporting fatigue at end of  selfcare tasks, returned to bed to rest OT Assessment Rehab Potential (ACUTE ONLY): Good OT Patient  demonstrates impairments in the following area(s): Balance;Perception;Safety;Cognition;Behavior;Endurance;Sensory OT Basic ADL's Functional Problem(s): Eating;Grooming;Bathing;Dressing;Toileting OT Transfers Functional Problem(s): Toilet;Tub/Shower OT Additional Impairment(s): None OT Plan OT Intensity: Minimum of 1-2 x/day, 45 to 90 minutes OT Frequency: 5 out of 7 days OT Duration/Estimated Length of Stay: 21-23 days OT Treatment/Interventions: Balance/vestibular training;Discharge planning;Pain management;Self Care/advanced ADL retraining;Therapeutic Activities;Cognitive remediation/compensation;Disease mangement/prevention;Functional mobility training;Patient/family education;Therapeutic Exercise;Visual/perceptual remediation/compensation;DME/adaptive equipment instruction;Community reintegration;Neuromuscular re-education;UE/LE Strength taining/ROM OT Self Feeding Anticipated Outcome(s): supervision OT Basic Self-Care Anticipated Outcome(s): min assist OT Toileting Anticipated Outcome(s): min assist OT Bathroom Transfers Anticipated Outcome(s): min assist OT Recommendation Follow Up Recommendations: Home health OT;24 hour supervision/assistance Equipment Recommended: To be determined   Skilled Therapeutic Intervention Pt worked on selfcare retraining sit to stand from the EOB.  He needed max instructional cueing for initiation of bathing tasks with washcloth prepared and presented to him most of the time.  He was able to spontaneously use both UEs to remove soap bottle cap and replace, but needed mod facilitation to pour the soap in the wash pan with the RUE.  Max assist was needed for initial transfer to the EOB as well as for static sitting balance.  He did progress to needing more mod assist for static sitting but during bathing he required max secondary to increased posterior and right LOB.  He was unable to answer questions consistently as he responses were mostly non-intelligible.   Overall he needed step by step max demonstrational cueing to wash with perseveration at times on washing his neck and face as well as the left forearm.  He demonstrates severe posterior bias in sitting with cervical flexion and protraction present.  Total +2 (pt 20%) for sit to stand and standing during washing of peri area and buttocks as well as when donning new brief and pants.  He was not able to reach down to either foot or tolerate crossing one over the opposite knee for LB dressing tasks.  Max assist to [employer] with transition back to the bed at completion of dressing for safety and secondary to pt fatigue.  Pt's spouse in at end of session.  Discussed prior functional level and history of dementia as well as home setup.  He has a walk-in shower on the first floor with regular height toilet.  She reports using a BSC in the shower as needed and also placing it over the toilet since he started to decline.  Pt left with safety alarm in place and call button and phone in reach with spouse present.  She requested to talk to the MD if possible and this was relayed to nursing as MD is currently out of the building.    OT Evaluation Precautions/Restrictions  Precautions Precautions: Fall Precaution Comments: dementia, slow processing/initiation/poor attention Restrictions Weight Bearing Restrictions: No  Pain Pain Assessment Pain Scale: Faces Pain Score: 0-No pain Faces Pain Scale: No hurt Patients Stated Pain Goal: 0 Home Living/Prior Functioning Home Living Family/patient expects to be discharged to:: Private residence Living Arrangements: Spouse/significant other Available Help at Discharge: Family, Available 24 hours/day Type of Home: House Home Access: Stairs to enter [employer] of Steps: 4 Entrance Stairs-Rails: Right, Left, Can reach both Home Layout: Two level, Able to live on main level with bedroom/bathroom Alternate Level Stairs-Number of Steps:  Flight Alternate Level Stairs-Rails: Right Bathroom Shower/Tub: Multimedia programmer: Standard Bathroom Accessibility: Yes  Lives With: Spouse IADL History  Occupation: Retired Prior Function Level of Independence: Independent with basic ADLs  Able to [place]?: Yes Vocation: Retired Comments: Had gone up and down the flight of steps in his home as recently as 2/26 ADL ADL Grooming: Maximal assistance Where Assessed-Grooming: Edge of bed Upper Body Bathing: Maximal assistance Where Assessed-Upper Body Bathing: Edge of bed Lower Body Bathing: Other (comment)(total +2) Where Assessed-Lower Body Bathing: Edge of bed Upper Body Dressing: Maximal assistance Where Assessed-Upper Body Dressing: Edge of bed Lower Body Dressing: Other (Comment)(total +2) Toileting: Other (Comment)(total +2) Toilet Transfer: Other (comment)(total +2) Toilet Transfer Equipment: Bedside commode Vision Baseline Vision/History: Wears glasses Patient Visual Report: No change from baseline Vision Assessment?: (unable to accurately assess with examine closer in treatments) Perception  Perception: Impaired Spatial Orientation: LOB to the right and posterior in sitting, pt unable to recognize LOB when asked Praxis Praxis: Intact Cognition Overall Cognitive Status: Impaired/Different from baseline Arousal/Alertness: Awake/alert Orientation Level: Person;Place;Situation Person: Oriented(He could not state his last name when asked, but did state his first name) Place: Disoriented Situation: Disoriented Memory: Impaired Memory Impairment: Decreased recall of new information;Decreased short term memory;Storage deficit Decreased Short Term Memory: Functional basic Attention: Focused Focused Attention: Impaired Focused Attention Impairment: Functional basic;Verbal basic Awareness: Impaired Awareness Impairment: Intellectual impairment Problem Solving: Impaired Problem Solving Impairment:  Functional basic Executive Function: (all impacted by lower level deficits) Safety/Judgment: Impaired Comments: Pt with dysarthria at times when asked specific questions, he could not state clear intelligible words. Sensation Sensation Light Touch: Not tested Hot/Cold: Not tested Proprioception: Not tested Stereognosis: Not tested Additional Comments: Sensation not formally assessed with examine closer during functional treatment Coordination Gross Motor Movements are Fluid and Coordinated: No Fine Motor Movements are Fluid and Coordinated: Not tested Motor  Motor Motor: Abnormal postural alignment and control Motor - Skilled Clinical Observations: Pt able to move BUEs for selfcare tasks with with overall slower movement and likely generalized weakness Mobility  Bed Mobility Bed Mobility: Supine to Sit;Sit to Supine Supine to Sit: Maximal Assistance - Patient - Patient 25-49% Sit to Supine: Maximal Assistance - Patient 25-49% Transfers Sit to Stand: 2 Helpers Stand to Sit: 2 Helpers  Trunk/Postural Assessment  Cervical Assessment Cervical Assessment: Exceptions to WFL(cervicl protraction and flexion at rest) Thoracic Assessment Thoracic Assessment: Exceptions to WFL(moderate thoracic kyphosis at rest with scapular adduction bilaterally) Lumbar Assessment Lumbar Assessment: Exceptions to WFL(posterior pelvic tilt at rest) Postural Control Postural Control: Deficits on evaluation Righting Reactions: LOB posteriorly and to the right  Balance Balance Balance Assessed: Yes Static Sitting Balance Static Sitting - Balance Support: Feet supported Static Sitting - Level of Assistance: 2: Max assist Dynamic Sitting Balance Dynamic Sitting - Balance Support: During functional activity Dynamic Sitting - Level of Assistance: 2: Max assist Static Standing Balance Static Standing - Level of Assistance: 1: +2 Total assist Dynamic Standing Balance Dynamic Standing - Balance Support:  During functional activity Dynamic Standing - Level of Assistance: 1: +2 Total assist Extremity/Trunk Assessment RUE Assessment RUE Assessment: Exceptions to [facility] General Strength Comments: Strength not formally assessed but pt at least 3+/5 noted during functional use.  Will look at closer in treatment.  He likley will exhibit limitations with shoulder flexion secondary to posture and scapular positioning. LUE Assessment LUE Assessment: Exceptions to [facility] General Strength Comments: Strength not formally assessed but pt at least 3+/5 noted during functional use.  Will look at closer in treatment.  He likley will exhibit limitations with shoulder flexion secondary to posture and scapular positioning.  Refer to Care Plan for Long Term Goals  Recommendations for other services: Neuropsych   Discharge Criteria: Patient will be discharged from OT if patient refuses treatment 3 consecutive times without medical reason, if treatment goals not met, if there is a change in medical status, if patient makes no progress towards goals or if patient is discharged from hospital.  The above assessment, treatment plan, treatment alternatives and goals were discussed and mutually agreed upon: by patient and by family  Avish Torry  OTR/L 10/19/2019, 5:02 PM

## 2019-10-19 NOTE — Progress Notes (Signed)
PHARMACY NOTE:  ANTIMICROBIAL RENAL DOSAGE ADJUSTMENT  Current antimicrobial regimen includes a mismatch between antimicrobial dosage and estimated renal function.  As per policy approved by the Pharmacy & Therapeutics and Medical Executive Committees, the antimicrobial dosage will be adjusted accordingly.  Current antimicrobial dosage:  Acyclovir 500mg  IV Q12H  Indication: HSV encephalitis   Renal Function:  Estimated Creatinine Clearance: 53 mL/min (by C-G formula based on SCr of 0.81 mg/dL). []      On intermittent HD, scheduled: []      On CRRT    Antimicrobial dosage has been changed to:  Acyclovir 500mg  IV Q8H  Eric Larsen, PharmD, BCPS, [place] 10/19/2019, 10:03 PM

## 2019-10-19 NOTE — Plan of Care (Signed)
  Problem: Consults Goal: RH GENERAL PATIENT EDUCATION Description: See Patient Education module for education specifics. Outcome: Progressing   Problem: RH BOWEL ELIMINATION Goal: RH STG MANAGE BOWEL WITH ASSISTANCE Description: STG Manage Bowel with Assistance. Mod  Outcome: Progressing   Problem: RH BLADDER ELIMINATION Goal: RH STG MANAGE BLADDER WITH ASSISTANCE Description: STG Manage Bladder With Assistance. Mod Outcome: Progressing   Problem: RH SKIN INTEGRITY Goal: RH STG MAINTAIN SKIN INTEGRITY WITH ASSISTANCE Description: STG Maintain Skin Integrity With Assistance. Mod Outcome: Progressing   Problem: RH SAFETY Goal: RH STG ADHERE TO SAFETY PRECAUTIONS W/ASSISTANCE/DEVICE Description: STG Adhere to Safety Precautions With Assistance/Device. Mod Outcome: Progressing   Problem: RH PAIN MANAGEMENT Goal: RH STG PAIN MANAGED AT OR BELOW PT'S PAIN GOAL Description: Less than 3 Outcome: Progressing

## 2019-10-19 NOTE — Progress Notes (Signed)
Received a call from internal medicine resident who reports that ID recommends 3 week course of IV acyclovir. End date of 3/21 added to antibiotic regimen.

## 2019-10-19 NOTE — Evaluation (Signed)
Physical Therapy Assessment and Plan  Patient Details  Name: Eric Larsen MRN: [id number] Date of Birth: 1931/01/30  PT Diagnosis: Abnormal posture, Abnormality of gait, Cognitive deficits, Difficulty walking, Impaired cognition and Muscle weakness Rehab Potential: Good ELOS: 21-24 days   Today's Date: 10/19/2019 PT Individual Time: 9242-6834 PT Individual Time Calculation (min): 56 min    Problem List:  Patient Active Problem List   Diagnosis Date Noted  . Hypoalbuminemia due to protein-calorie malnutrition ([place])   . Microcytic anemia   . Dysphagia   . Encephalitis 10/18/2019  . Abnormal EEG   . Hypothyroidism   . Macrocytic anemia   . 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 Hip fracture ([place]) 08/12/2017  . Hallux valgus of left foot 04/28/2017  . Hammer toe of left foot 04/28/2017  . Corn of toe 04/28/2017  . History of total left hip replacement 09/19/2015  . Spherocytosis (familial) ([place]) 09/17/2015  . Mixed hyperlipidemia 06/19/2015  . Benign prostatic hyperplasia with lower urinary tract symptoms 06/19/2015  . History of fracture of vertebra 11/06/2014  . History of colon polyps 08/08/2014  . Pathological fracture 11/01/2013  . Toenail fungus 01/20/2013  . Vitamin D deficiency 09/27/2012  . Primary hypothyroidism 09/27/2012  . Osteoporosis 09/27/2012  . Spherocytosis ([place]) 12/04/2011  . Skin cancer 12/04/2011    Past Medical History:  Past Medical History:  Diagnosis Date  . Hypothyroidism   . Osteoporosis   . Thyroid disease    Past Surgical History:  Past Surgical History:  Procedure Laterality Date  . CHOLECYSTECTOMY    . HIP FRACTURE SURGERY    . INTRAMEDULLARY (IM) NAIL INTERTROCHANTERIC Right 08/13/2017   Procedure: INTRAMEDULLARY (IM) NAIL INTERTROCHANTRIC;  Surgeon: Earnestine Leys, MD;  Location: [facility] ORS;  Service: Orthopedics;  Laterality: Right;  . SHOULDER SURGERY      Assessment & Plan Clinical Impression:  Eric Larsen is an 86 year old male with history of osteoporosis, gait disorder, heriditary spherocytosis, recent diagnosis of dementia who was admitted on 10/14/2019 with intermittent confusion and progressive weakness x1 week.  History taken from wife due to cognition.  Wife also reports 6 month history of problems with memory and weight loss. He was found to be febrile with temp of 102.6 and MRI brain done showing left anterior medial temporal lobe thickening and T2 hyperintensity gyri with edema and small nonmasslike enhancement left anterior temporal lobe.  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  Empiric IV acyclovir was initiated.  TSH- 2.289 and levothyroxine increased to 125 mcg/day. EEG done showing sharp waves in left anterior temporal lesion with mild to moderate diffuse encephalopathy.  He was loaded with Keppra but continued to have issues with confusion and bouts of lethargy.  Hospital course further complicated by dysphagia due to tendency to hold orals and therefore he was placed on a dysphagia 1, thin liquid diet.  LT-EEG was negative for seizures but did show epileptogenicity and is to continue Keppra.  Blood cultures x2 done in 1+ for staph felt to be contaminant. LP was ordered for work-up and showed elevated protein-139 and lymphocytosis with WBC 84.  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  Patient with resultant cognitive deficits with unintelligible language, difficulty following commands, and significant weakness requiring STEDY to stand.  CIR recommended due to functional decline.  Please see preadmission assessment from earlier today as well.  Patient currently requires Max assist x2  with mobility secondary  to muscle weakness, unbalanced muscle activation, decreased coordination and decreased motor planning, decreased initiation, decreased attention, decreased awareness, decreased problem solving, decreased  safety awareness, decreased memory and delayed processing and decreased sitting balance, decreased standing balance, decreased postural control and decreased balance strategies.  Prior to hospitalization, patient was mod(I) for ambulation with SPC, Mod assist from family for stairs  with mobility and lived with Spouse in a House home.  Home access is 4Stairs to enter.  Patient will benefit from skilled PT intervention to maximize safe functional mobility, minimize fall risk and decrease caregiver burden for planned discharge home with 24 hour assist.  Anticipate patient will benefit from follow up [facility] at discharge.  PT - End of Session Activity Tolerance: Decreased this session Endurance Deficit: Yes PT Assessment Rehab Potential (ACUTE/IP ONLY): Good PT Barriers to Discharge: Decreased caregiver support;Incontinence PT Patient demonstrates impairments in the following area(s): Balance;Safety;Endurance;Motor PT Transfers Functional Problem(s): Bed Mobility;Bed to Chair;Car;Furniture;Floor PT Locomotion Functional Problem(s): Ambulation;Stairs PT Plan PT Intensity: Minimum of 1-2 x/day ,45 to 90 minutes PT Frequency: 5 out of 7 days PT Duration Estimated Length of Stay: 21-24 days PT Treatment/Interventions: Ambulation/gait training;DME/adaptive equipment instruction;Neuromuscular re-education;Stair training;UE/LE Strength taining/ROM;Discharge planning;Skin care/wound management;Therapeutic Activities;UE/LE Coordination activities;Cognitive remediation/compensation;Disease management/prevention;Functional mobility training;Patient/family education;Splinting/orthotics;Therapeutic Exercise;Visual/perceptual remediation/compensation;Balance/vestibular training PT Transfers Anticipated Outcome(s): MinA LRAD PT Locomotion Anticipated Outcome(s): MinA LRAD PT Recommendation Follow Up Recommendations: Home health PT;24 hour supervision/assistance Patient destination: Home Equipment Recommended: 3 in 1  bedside comode;Rolling walker with 5" wheels;Tub/shower seat Equipment Details: has SPC and transport chair at home  Skilled Therapeutic Intervention  Patient received in bed, asleep but able to wake and participatory in therapy today. Generally required maxAx1 for all bed mobility, able to pivot to chair with totalAx1 for balance/safety with heavy cues for sequencing and standby of second person for safety. Attempted WC propulsion however limited by cognitive deficits. Did well today, able to gait train x35f with hallway railing with totalAx1 and close WC follow, also tolerated stair training with M[place]ion[place] B railings for safety. Does have definite posterior bias with dynamic activities. Fatigues easily, but well known to this therapist and is improving. Does have history of dementia and needed increased time for processing as well as simple and repetitive cues to participate in simple activities. Spouse educated on performance this session, pleased with progress thus far. He was left up in the chair with all needs met, seatbelt alarm active, and spouse present.   PT Evaluation Precautions/Restrictions Precautions Precautions: Fall Precaution Comments: dementia, slow processing/initiation/poor attention Restrictions Weight Bearing Restrictions: No General Chart Reviewed: Yes Response to Previous Treatment: Patient with no complaints from previous session.;Patient unable to report, no changes reported from family or staff Family/Caregiver Present: Yes Vital Signs  Pain Pain Assessment Pain Scale: Faces Pain Score: 0-No pain Faces Pain Scale: No hurt Patients Stated Pain Goal: 0 Home Living/Prior Functioning Home Living Available Help at Discharge: Family;Available 24 hours/day Type of Home: House Home Access: Stairs to enter [employer] Steps: 4 Entrance Stairs-Rails: Right;Left;Can reach both Home Layout: Two level;Able to live on main level with  bedroom/bathroom Alternate Level Stairs-Number of Steps: Flight Alternate Level Stairs-Rails: Right Bathroom Shower/Tub: WMultimedia programmer Standard Bathroom Accessibility: Yes  Lives With: Spouse Prior Function Level of Independence: Requires assistive device for independence;Other (comment)(needed assist on steps from family)  Able to Take Stairs?: Yes Vocation: Retired Comments: Had gone up and down the flight of steps in his home as recently as 2/26 Vision/Perception  Cognition Overall Cognitive Status: Impaired/Different from baseline Arousal/Alertness: Awake/alert Orientation Level: Oriented to person Attention: Focused Focused Attention: Impaired Focused Attention Impairment: Verbal basic;Functional basic Memory: Impaired Memory Impairment: Decreased short term memory;Storage deficit;Retrieval deficit;Decreased recall of new information Decreased Short Term Memory: Verbal basic;Functional basic Safety/Judgment: Impaired Sensation Sensation Light Touch: Not tested Proprioception: Impaired by gross assessment Additional Comments: inconsistent reporting due to cognitive status Coordination Gross Motor Movements are Fluid and Coordinated: No Fine Motor Movements are Fluid and Coordinated: Not tested Motor  Motor Motor: Abnormal postural alignment and control;Motor perseverations Motor - Skilled Clinical Observations: structural kyphotic posture, perseverated on fidgeting with shirt/small items around him in the room  Mobility Bed Mobility Bed Mobility: Supine to Sit Supine to Sit: Maximal Assistance - Patient - Patient 25-49% Transfers Transfers: Sit to Stand;Stand to Sit;Stand Pivot Transfers Sit to Stand: Maximal Assistance - Patient 25-49% Stand to Sit: Maximal Assistance - Patient 25-49% Stand Pivot Transfers: Maximal Assistance - Patient 25 - 49%;2 Helpers(standby of second person for safety) Stand Pivot Transfer Details: Tactile cues for  initiation;Verbal cues for technique;Visual cues/gestures for sequencing;Verbal cues for precautions/safety;Manual facilitation for weight shifting Transfer (Assistive device): 1 person hand held assist Locomotion  Gait Ambulation: Yes Gait Assistance: 2 Helpers Gait Distance (Feet): 10 Feet Assistive device: 1 person hand held assist;Other (Comment)(railing in hallway) Gait Assistance Details: Verbal cues for sequencing;Verbal cues for gait pattern;Verbal cues for technique;Verbal cues for precautions/safety Gait Gait: Yes Gait Pattern: Impaired Gait Pattern: Step-to pattern;Decreased step length - right;Decreased step length - left;Decreased stance time - right;Decreased stance time - left;Decreased stride length;Decreased dorsiflexion - right;Decreased dorsiflexion - left;Decreased trunk rotation;Narrow base of support;Poor foot clearance - left;Poor foot clearance - right;Trunk flexed Stairs / Additional Locomotion Stairs: Yes Stairs Assistance: 2 Helpers;Maximal Assistance - Patient 25 - 49% Stair Management Technique: Two rails;Step to pattern;Forwards Number of Stairs: 2 Height of Stairs: 6 Wheelchair Mobility Wheelchair Mobility: No(unable to follow cues/sequencing due to cognition deficits)  Trunk/Postural Assessment  Cervical Assessment Cervical Assessment: Exceptions to WFL(extreme structural kyphosis) Thoracic Assessment Thoracic Assessment: Exceptions to WFL(extreme structural kyphosis) Lumbar Assessment Lumbar Assessment: Exceptions to WFL(extreme structural kyphosis) Postural Control Postural Control: Deficits on evaluation  Balance Balance Balance Assessed: Yes Static Sitting Balance Static Sitting - Level of Assistance: 4: Min assist Dynamic Sitting Balance Dynamic Sitting - Level of Assistance: 3: Mod assist Static Standing Balance Static Standing - Level of Assistance: 2: Max assist Dynamic Standing Balance Dynamic Standing - Level of Assistance: 1: +2 Total  assist Extremity Assessment      RLE Assessment General Strength Comments: did not follow cues for MMT- grossly observed 3+/5 LLE Assessment General Strength Comments: did not follow cues for MMT- grossly observed 3+/5    Refer to Care Plan for Long Term Goals  Recommendations for other services: None   Discharge Criteria: Patient will be discharged from PT if patient refuses treatment 3 consecutive times without medical reason, if treatment goals not met, if there is a change in medical status, if patient makes no progress towards goals or if patient is discharged from hospital.  The above assessment, treatment plan, treatment alternatives and goals were discussed and mutually agreed upon: by family  Ann Lions PT, DPT, PN1   Supplemental Physical Therapist [place]    Pager [phone] Acute Rehab Office [phone]

## 2019-10-19 NOTE — Progress Notes (Addendum)
Discussed with ID, Dr. [name], length of time needed for acyclovir for the treatment of HSV encephalitis--recommending 3w. Communicated this with the provider on rehab,Dr. Posey Pronto, where the patient is currently staying.  Mitzi Hansen, MD

## 2019-10-19 NOTE — Progress Notes (Signed)
Inpatient Rehabilitation  Patient information reviewed and entered into eRehab system by Raquel Racey M. Wandalee Klang, M.A., CCC/SLP, PPS Coordinator.  Information including medical coding, functional ability and quality indicators will be reviewed and updated through discharge.    

## 2019-10-20 ENCOUNTER — Inpatient Hospital Stay (HOSPITAL_COMMUNITY): Payer: Medicare Other | Admitting: Occupational Therapy

## 2019-10-20 ENCOUNTER — Inpatient Hospital Stay (HOSPITAL_COMMUNITY): Payer: Medicare Other

## 2019-10-20 ENCOUNTER — Encounter (HOSPITAL_COMMUNITY): Payer: Medicare Other | Admitting: Speech Pathology

## 2019-10-20 DIAGNOSIS — D539 Nutritional anemia, unspecified: Secondary | ICD-10-CM

## 2019-10-20 NOTE — Progress Notes (Signed)
Occupational Therapy Session Note  Patient Details  Name: Eric Larsen MRN: [id number] Date of Birth: [birth year]  Today's Date: 10/20/2019 OT Individual Time: [id number] OT Individual Time Calculation (min): 42 min    Short Term Goals: Week 1:  OT Short Term Goal 1 (Week 1): Pt will maintain static sitting balance with supervision for at least 3 mins in perparation for selfcare tasks. OT Short Term Goal 2 (Week 1): Pt will complete UB bathing with no more than min instructional cueing for initiation and sequencing. OT Short Term Goal 3 (Week 1): Pt will complete LB bathing sit to stand with max assist and use of AE PRN. OT Short Term Goal 4 (Week 1): Pt will complete toilet transfer with max assist using RW for support. OT Short Term Goal 5 (Week 1): Pt will donn a pullover shirt with no more than min assist in supported sitting for two consecutive trials.  Skilled Therapeutic Interventions/Progress Updates:    Treatment session with focus on postural control in sitting and standing, functional transfers, and following one step commands.  Pt received asleep in bed requiring increased time and sternal rub to arouse, even then pt maintaining eyes closed for majority of session.  Completed bed mobility with mod-max assist to come to sitting EOB.  Engaged in washing face in sitting with focus on sitting balance, pt requiring min-mod assist for sitting balance due to Rt lean with difficulty correcting.  Squat pivot transfer to Lt Mod assist with +2 for safety.  Engaged in visual scanning task in sitting to replicate simple ABAB pattern with pegs, pt required max question cues and field of 2 when choosing next peg.  Completed sit > stand min assist for facilitation of weight shift, requiring up to max assist to achieve somewhat full upright standing posture - pt with flexed knees, hips, and forward rounded shoulders in standing.  +2 facilitation and stability with upright standing.  Pt able to increase  posture to more upright to remove 2 pegs from board before requiring seated rest break.  Returned to w/c and then bed with Max assist squat pivot to Rt +2.  Returned to supine and nurse tech in to assess vitals.  Therapy Documentation Precautions:  Precautions Precautions: Fall Precaution Comments: dementia, slow processing/initiation/poor attention Restrictions Weight Bearing Restrictions: No Pain:  Pt with c/o pain in standing, unable to state where.  Returned to sitting and supine at end of session.   Therapy/Group: Individual Therapy  [name] 10/20/2019, 12:08 PM

## 2019-10-20 NOTE — Progress Notes (Signed)
[place] PHYSICAL MEDICINE & REHABILITATION PROGRESS NOTE  Subjective/Complaints: Patient seen sitting up in bed this morning.  No reported issues overnight.  He appears more alert and attempting to communicate more.  Appears to have slightly more reliable yes/no responses.  ROS: Unable to assess due to cognition.  Objective: Vital Signs: Blood pressure 130/66, pulse (!) 59, temperature 98.2 F (36.8 C), resp. rate 16, height 5\' 4"  (1.626 m), weight 57.2 kg, SpO2 100 %. No results found. Recent Labs    10/19/19 0516  WBC 10.1  HGB 11.8*  HCT 34.8*  PLT 221   Recent Labs    10/19/19 0516  NA 138  K 3.6  CL 104  CO2 28  GLUCOSE 106*  BUN 15  CREATININE 0.81  CALCIUM 9.0    Physical Exam: BP 130/66 (BP Location: Left Arm)   Pulse (!) 59   Temp 98.2 F (36.8 C)   Resp 16   Ht 5\' 4"  (1.626 m)   Wt 57.2 kg   SpO2 100%   BMI 21.65 kg/m  Constitutional: No distress . Vital signs reviewed.  Frail. HENT: Normocephalic.  Atraumatic. Eyes: EOMI. No discharge. Cardiovascular: No JVD. Respiratory: Normal effort.  No stridor. GI: Non-distended. Skin: Warm and dry.  Intact. Psych: Unable to assess due to mentation.   Musc: No edema in extremities.  No tenderness in extremities. Neurological: Alert Motor: Appears to be 4+/5 throughout   Assessment/Plan: 1. 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  Physiatrist is providing close team supervision and 24 hour management of active medical problems listed below.  Physiatrist and rehab team continue to assess barriers to discharge/monitor patient progress toward functional and medical goals  Care Tool:  Bathing    Body parts bathed by patient: Right arm, Left arm, Chest, Abdomen, Face, Left upper leg, Right upper leg   Body parts bathed by helper: Left lower leg, Right lower leg, Front perineal area,  Buttocks     Bathing assist Assist Level: 2 Helpers     Upper Body Dressing/Undressing Upper body dressing   What is the patient wearing?: Pull over shirt    Upper body assist Assist Level: Maximal Assistance - Patient 25 - 49%    Lower Body Dressing/Undressing Lower body dressing      What is the patient wearing?: Incontinence brief     Lower body assist Assist for lower body dressing: Total Assistance - Patient < 25%     Toileting Toileting    Toileting assist Assist for toileting: 2 Helpers     Transfers Chair/bed transfer  Transfers assist     Chair/bed transfer assist level: Total Assistance - Patient < 25%     Locomotion Ambulation   Ambulation assist      Assist level: 2 helpers Assistive device: Other (comment)(railing in hallway) Max distance: 3ft   Walk 10 feet activity   Assist     Assist level: 2 helpers Assistive device: Other (comment)(railing in the hallway)   Walk 50 feet activity   Assist Walk 50 feet with 2 turns activity did not occur: Safety/medical concerns(fatigued)         Walk 150 feet activity   Assist Walk 150 feet activity did not occur: Safety/medical concerns(fatigued)         Walk 10 feet on uneven surface  activity   Assist Walk 10 feet on uneven surfaces activity did not occur: Safety/medical  concerns(fatigued)         Wheelchair     Assist Will patient use wheelchair at discharge?: Yes Type of Wheelchair: Manual    Wheelchair assist level: Dependent - Patient 0%(attempted, unable to follow due to cognitive status)      Wheelchair 50 feet with 2 turns activity    Assist        Assist Level: Dependent - Patient 0%   Wheelchair 150 feet activity     Assist     Assist Level: Dependent - Patient 0%      Medical Problem List and Plan: 1.  Cognitive deficits with unintelligible language, weakness, secondary to herpes encephalitis.  Continue CIR             Cont IV  acyclovir until 3/21 2.  Antithrombotics: -DVT/anticoagulation:  Pharmaceutical: Lovenox             -antiplatelet therapy: NA 3. Pain Management: PRN medications 4. Mood: Team support             -antipsychotic agents: NA 5. Neuropsych: This patient is not capable of making decisions on his own behalf.  Telesitter for safety 6. Skin/Wound Care: Routine pressure relief measures.  7. Fluids/Electrolytes/Nutrition: Monitor I/O.   BMP within acceptable range on 3/4, labs ordered for Monday 8. Hypothyroid: Continue Synthroid 149mcg. 9. Epileptogenic potential: Continue Keppra 500 mg bid for seizure  No seizures from admission-3/5 10. Hypokalemia: Resolved 11. Macrocytic anemia:   Hemoglobin 11.8 on 3/4, labs ordered for Monday  Continue to monitor 12.  Hypoalbuminemia  Supplement initiated on 3/4 13.  Dysphagia  D1 thins, advance diet as tolerated  LOS: 2 days A FACE TO FACE EVALUATION WAS PERFORMED  Debralee Braaksma Lorie Phenix 10/20/2019, 7:57 AM

## 2019-10-20 NOTE — Progress Notes (Signed)
Occupational Therapy Session Note  Patient Details  Name: Eric Larsen MRN: [id number] Date of Birth: 1931-03-18  Today's Date: 10/20/2019 OT Individual Time: 1420-1505 OT Individual Time Calculation (min): 45 min    Short Term Goals: Week 1:  OT Short Term Goal 1 (Week 1): Pt will maintain static sitting balance with supervision for at least 3 mins in perparation for selfcare tasks. OT Short Term Goal 2 (Week 1): Pt will complete UB bathing with no more than min instructional cueing for initiation and sequencing. OT Short Term Goal 3 (Week 1): Pt will complete LB bathing sit to stand with max assist and use of AE PRN. OT Short Term Goal 4 (Week 1): Pt will complete toilet transfer with max assist using RW for support. OT Short Term Goal 5 (Week 1): Pt will donn a pullover shirt with no more than min assist in supported sitting for two consecutive trials.  Skilled Therapeutic Interventions/Progress Updates:    Treatment session with focus on arousal, attention to task, and sequencing with familiar and novel tasks.  Pt received upright in bed, more alert than AM session.  Pt declined OOB activity, but agreeable to activity seated EOB.  Max assist bed mobility and max assist for sitting balance due to posterior lean.  Engaged in table top task to facilitate upright sitting posture while following one step commands.  Pt required hand over hand to initiate reaching to complete simple pattern in Connect 4 grid.  Engaged in oral care seated EOB with max multimodal cues and assist to complete.  Pt unable to initiate task, but once toothbrush placed in mouth pt able to brush teeth.  Cues for rinsing and spitting.  Returned to bed Max assist and returned to semi-reclined position.  [name] present throughout session providing insight to pt condition and encouragement to pt to engage.  Therapy Documentation Precautions:  Precautions Precautions: Fall Precaution Comments: dementia, slow  processing/initiation/poor attention Restrictions Weight Bearing Restrictions: No Pain:  Pt with no c/o pain   Therapy/Group: Individual Therapy  [name] 10/20/2019, 3:22 PM

## 2019-10-20 NOTE — Progress Notes (Signed)
Modified Barium Swallow Progress Note  Patient Details  Name: Eric Larsen MRN: [id number] Date of Birth: [birth year]  Today's Date: 10/20/2019  Modified Barium Swallow completed.  Full report located under Chart Review in the Imaging Section.  Brief recommendations include the following:  Clinical Impression  Pt presents with severe oral dysphagia and mild to moderate pharyngeal dysphagia. Pt's oral phase is severely long (>20 seconds with single bolus of puree). Pt's cognitive deficits decrease his attention to all boluses and promotes oral holding. In addition when pt is attentive to bolus he has severely decreased lingual manipulation, decreased bolus cohesion, reduced posterior propulsion, premature spillage to vallecula and piecemeal swallows with nectar thick liquids, thin liquids and puree. Pt's pharyngeal phase is c/b reduced base of tongue strength, delayed swallow initiation to vallecula and reduced pharyngeal parestalsis. As a result, pt has moderate to severe vallecular residue throughout all consistencies. Pt's altered mental status prevents him from following any directions or using any compensatory strategies. Recommmend pt continue consuming puree diet with thin liquids, medicine crushed in puree and full nursing supervision to increase attention to bolus.   Swallow Evaluation Recommendations       SLP Diet Recommendations: Dysphagia 1 (Puree) solids;Thin liquid   Liquid Administration via: Cup;Straw   Medication Administration: Crushed with puree   Supervision: Full assist for feeding;Full supervision/cueing for compensatory strategies   Compensations: Minimize environmental distractions;Slow rate;Small sips/bites   Postural Changes: Seated upright at 90 degrees   Oral Care Recommendations: Oral care BID        Eric Larsen 10/20/2019,2:31 PM

## 2019-10-20 NOTE — Plan of Care (Signed)
  Problem: Consults Goal: RH GENERAL PATIENT EDUCATION Description: See Patient Education module for education specifics. Outcome: Progressing   Problem: RH BOWEL ELIMINATION Goal: RH STG MANAGE BOWEL WITH ASSISTANCE Description: STG Manage Bowel with Assistance. Mod  Outcome: Progressing   Problem: RH BLADDER ELIMINATION Goal: RH STG MANAGE BLADDER WITH ASSISTANCE Description: STG Manage Bladder With Assistance. Mod Outcome: Progressing   Problem: RH SKIN INTEGRITY Goal: RH STG MAINTAIN SKIN INTEGRITY WITH ASSISTANCE Description: STG Maintain Skin Integrity With Assistance. Mod Outcome: Progressing   Problem: RH SAFETY Goal: RH STG ADHERE TO SAFETY PRECAUTIONS W/ASSISTANCE/DEVICE Description: STG Adhere to Safety Precautions With Assistance/Device. Mod Outcome: Progressing   Problem: RH PAIN MANAGEMENT Goal: RH STG PAIN MANAGED AT OR BELOW PT'S PAIN GOAL Description: Less than 3 Outcome: Progressing

## 2019-10-20 NOTE — Care Management (Signed)
Patient Details  Name: Eric Larsen MRN: [id number] Date of Birth: 02/13/1931  Today's Date: 10/20/2019  Problem List:  Patient Active Problem List   Diagnosis Date Noted  . Hypoalbuminemia due to protein-calorie malnutrition ([place])   . Microcytic anemia   . Dysphagia   . Encephalitis 10/18/2019  . Abnormal EEG   . Hypothyroidism   . Macrocytic anemia   . 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 Hip fracture ([place]) 08/12/2017  . Hallux valgus of left foot 04/28/2017  . Hammer toe of left foot 04/28/2017  . Corn of toe 04/28/2017  . History of total left hip replacement 09/19/2015  . Spherocytosis (familial) ([place]) 09/17/2015  . Mixed hyperlipidemia 06/19/2015  . Benign prostatic hyperplasia with lower urinary tract symptoms 06/19/2015  . History of fracture of vertebra 11/06/2014  . History of colon polyps 08/08/2014  . Pathological fracture 11/01/2013  . Toenail fungus 01/20/2013  . Vitamin D deficiency 09/27/2012  . Primary hypothyroidism 09/27/2012  . Osteoporosis 09/27/2012  . Spherocytosis ([place]) 12/04/2011  . Skin cancer 12/04/2011   Past Medical History:  Past Medical History:  Diagnosis Date  . Hypothyroidism   . Osteoporosis   . Thyroid disease    Past Surgical History:  Past Surgical History:  Procedure Laterality Date  . CHOLECYSTECTOMY    . HIP FRACTURE SURGERY    . INTRAMEDULLARY (IM) NAIL INTERTROCHANTERIC Right 08/13/2017   Procedure: INTRAMEDULLARY (IM) NAIL INTERTROCHANTRIC;  Surgeon: Earnestine Leys, MD;  Location: [facility] ORS;  Service: Orthopedics;  Laterality: Right;  . SHOULDER SURGERY     Social History:  reports that he has never smoked. He has never used smokeless tobacco. He reports current alcohol use. He reports that he does not use drugs.  Family / Support Systems Marital Status: Married Patient Roles: Spouse, Parent Spouse/Significant Other: [name] Children: Son Anticipated Caregiver: spouse, [name]   Ability/Limitations of Caregiver: supervision to min assist Caregiver Availability: 24/7  Social History Preferred language: English Religion: Moravian Read: Yes Write: Yes Employment Status: Retired Date Retired/Disabled/Unemployed: Tax Attorney   Abuse/Neglect Abuse/Neglect Assessment Can Be Completed: Unable to assess, patient is non-responsive or altered mental status  Emotional Status Pt's affect, behavior and adjustment status: The patient is alert but not oriented and restricted affect,with impulsive, unsafe behaviors noted. Psychiatric History: Dementia  Patient / Family Perceptions, Expectations & Goals Pt/Family understanding of illness & functional limitations: Wife appears to have a fair understanding of current health status Premorbid pt/family roles/activities: Patient was independent with ADLs and needed help with shopping, stairs and reminders to take medications appropriately Anticipated changes in roles/activities/participation: Wife may need to provide more assistance due to cognitive deficits Pt/family expectations/goals: Wife would like for the patient to return to his pre-admission level of activity  Adult nurse Living Arrangements: Spouse/significant other Support Systems: Spouse/significant other Type of Residence: Private residence Insurance Resources: Medicare Money Management: Spouse Does the patient have any problems obtaining your medications?: No Home Management: Wife will manage the home: cooking, Archivist Preliminary Plans: Return home with wife Sw Barriers to Discharge: Decreased caregiver support, Incontinence, Behavior Sw Barriers to Discharge Comments: Wife reports her husband can be stubborn and when he wants to do , he will however if he does not, he will not coorperate Social Work Anticipated Follow Up Needs: HH/OP Expected length of stay: 21 days  Clinical Impression The  patient is alert and follows commands. Appears to understand directions and attempts to  follow although actions are very delayed and instructions required to be repeated as well as verbal and tactile cues. Wife appears to have a fair understanding of current health and notes that she can manage the physical portion but has concerns about taking the patient home if his cognition does not clear. Encouraged to review questions and concerns with the physician. Dorien Chihuahua B 10/20/2019, 4:16 AM

## 2019-10-20 NOTE — Progress Notes (Signed)
Speech Language Pathology Daily Session Note  Patient Details  Name: Eric Larsen MRN: [id number] Date of Birth: [birth year]  Today's Date: 10/20/2019 SLP Individual Time: 1330-1400 SLP Individual Time Calculation (min): 30 min  Short Term Goals: Week 1: SLP Short Term Goal 1 (Week 1): Pt will sustain attention to basic task for 3 minutes with Max A cues. SLP Short Term Goal 2 (Week 1): Pt will initiate basic familiar tasks in 2 out of 5 opportunities with Max A cues. SLP Short Term Goal 3 (Week 1): Pt will follow 1 step directions in 2 out of 5 opportunities with Max A cues. SLP Short Term Goal 4 (Week 1): Pt will participate in basic problem solving tasks targeting basic ADLs with Max A cues. SLP Short Term Goal 5 (Week 1): Pt will consume least restrictive diet with minimal s/s of dsyphagia and aspiration.  Skilled Therapeutic Interventions:  Skilled treatment session targeted caregiver education with pt's wife. Education provided on MBS and images were reviewed with her. POC was discussed to target cognition and dysphagia. Pt and wife agreeable and all questions answered to her satisfaction. Pt left upright in bed with wife present.      Pain    Therapy/Group: Individual Therapy  Farah Lepak 10/20/2019, 2:34 PM

## 2019-10-20 NOTE — Progress Notes (Signed)
Physical Therapy Session Note  Patient Details  Name: Eric Larsen MRN: [id number] Date of Birth: 03/26/1931  Today's Date: 10/20/2019 PT Individual Time: 0800-0858 PT Individual Time Calculation (min): 58 min   Short Term Goals: Week 1:  PT Short Term Goal 1 (Week 1): Will be able to perform bed mobility with ModAx1 PT Short Term Goal 2 (Week 1): Will be able to stand for 5-7 minutes with BUE support and ModA PT Short Term Goal 3 (Week 1): Will be able to transfer with LRAD and ModAx1  Skilled Therapeutic Interventions/Progress Updates:   Received pt supine in bed finishing breakfast with NT present, PT took over with care, pt agreeable to therapy, and denied pain during session. Session focused on functional mobility/transfers, dressing, LE strength, dynamic standing balance, ambulation, and improved endurance with activity. Pt rolled to L and R with max A and use of bedrails and required total assist to doff soiled incontinence brief and don clean one. Pt rolled to L and R again to don pants with +2 assist. Pt transferred supine<>sitting EOB with max A. Pt initially requiring mod A to maintain static sitting balance fading to CGA. Doffed scrub top and donned pull over shirt with max A +2 to maintain sitting balance. Pt transferred stand<>pivot bed<>WC max A without AD +2 for safety. Pt transported to gym in [facility] total assist for time management purposes. Pt transferred sit<>stand max A +2 with RW. However pt with strong posterior lean and unable to stand completely upright despite verbal and tactile cues. Pt unable to follow cues to scoot hips forward and for anterior weight shifting prior to standing, requiring total assist to scoot hips forward. Pt ambulated 32ft with 3 musketeer assist. Pt with narrow BOS, posterior lean, and decreased bilateral stride length. Pt required verbal cues to correct and tacile cues to shift hips anteriorly. Pt performed seated knee extension and hip flexion 1x8  bilaterally. Pt required verbal and tactile cues for technique and quad activation. Pt required increased time due to slow processing and distractions in gym. Pt transported back to room in [facility] total assist. Therapist provided pt with blanket. Concluded session with pt sitting in WC for SLP, needs within reach, and seatbelt alarm on.   Therapy Documentation Precautions:  Precautions Precautions: Fall Precaution Comments: dementia, slow processing/initiation/poor attention Restrictions Weight Bearing Restrictions: No  Therapy/Group: Individual Therapy Alfonse Alpers PT, DPT   10/20/2019, 7:32 AM

## 2019-10-21 ENCOUNTER — Inpatient Hospital Stay (HOSPITAL_COMMUNITY): Payer: Medicare Other | Admitting: Physical Therapy

## 2019-10-21 ENCOUNTER — Inpatient Hospital Stay (HOSPITAL_COMMUNITY): Payer: Medicare Other | Admitting: Occupational Therapy

## 2019-10-21 ENCOUNTER — Inpatient Hospital Stay (HOSPITAL_COMMUNITY): Payer: Medicare Other | Admitting: Speech Pathology

## 2019-10-21 MED ORDER — LIDOCAINE 5 % EX PTCH
1.0000 | MEDICATED_PATCH | CUTANEOUS | Status: DC
  Administered 2019-10-21 – 2019-11-16 (×21): 1 via TRANSDERMAL
  Filled 2019-10-21 (×22): qty 1

## 2019-10-21 NOTE — Progress Notes (Signed)
Pharmacy Antimicrobial Note  Eric Larsen is a 86 y.o. male admitted on 10/18/2019 with altered mental status.  Pharmacy has been consulted for Acyclovir dosing for HSV 1 encephalitis. WBC WNL. CrCl ~53, stable. CSF positive for HSV 1, Day 7 of antiviral medications. Plan to treat for 3 weeks through 3/21.   Plan: Continue Acyclovir 500mg  IV q8h Monitor WBC, temp, & renal function   Height: 5\' 4"  (162.6 cm) Weight: 126 lb 1.7 oz (57.2 kg) IBW/kg (Calculated) : 59.2  Temp (24hrs), Avg:98.4 F (36.9 C), Min:98.1 F (36.7 C), Max:98.8 F (37.1 C)  Recent Labs  Lab 10/14/19 0849 10/15/19 0439 10/16/19 0303 10/17/19 0506 10/19/19 0516  WBC  --  12.4* 9.9 9.3 10.1  CREATININE  --  1.04 0.95 0.89 0.81  LATICACIDVEN 1.0  --   --   --   --     Estimated Creatinine Clearance: 53 mL/min (by C-G formula based on SCr of 0.81 mg/dL).    No Known Allergies  Kennon Holter, PharmD PGY1 Ambulatory Care Pharmacy Resident

## 2019-10-21 NOTE — Progress Notes (Signed)
Occupational Therapy Session Note  Patient Details  Name: Eric Larsen MRN: [id number] Date of Birth: 1930/09/07  Today's Date: 10/21/2019 OT Individual Time: [id number] OT Individual Time Calculation (min): 45 min    Short Term Goals: Week 1:  OT Short Term Goal 1 (Week 1): Pt will maintain static sitting balance with supervision for at least 3 mins in perparation for selfcare tasks. OT Short Term Goal 2 (Week 1): Pt will complete UB bathing with no more than min instructional cueing for initiation and sequencing. OT Short Term Goal 3 (Week 1): Pt will complete LB bathing sit to stand with max assist and use of AE PRN. OT Short Term Goal 4 (Week 1): Pt will complete toilet transfer with max assist using RW for support. OT Short Term Goal 5 (Week 1): Pt will donn a pullover shirt with no more than min assist in supported sitting for two consecutive trials.  Skilled Therapeutic Interventions/Progress Updates: Patient leaning slightly left asleep in wheel chair upon approach for OT and participated until he could not stay aroused as follows:        Patient difficult to engage and required much encouragement and waking up when he fell off to sleep  Oral care in w/c at sink= moderate assistance and encouragement to continue brushing thoroughly Sit to stand at sink in prep for washing bottom= total A x2 Washing of periarea and buttocks= total A x2 but patient able to maintain standing with max A for about 5 minutes for pericare and with encouragement to participate in thoracic extension standing at sink.    Pt maintained kyphotic posture throughout and unable to straighten upright, even with assist  W/c to bed transfer= total A x2 Bed positioning on his left side= total A  Patient maintained kphotic neck even when lying down.   This clinician postiioned neck with pillows  Call bell and phone within reach.   Though patient did not demonstrate understanding of purpose of how to  use.  Continue OT Plan of Care     Therapy Documentation Precautions:  Precautions Precautions: Fall Precaution Comments: dementia, slow processing/initiation/poor attention Restrictions Weight Bearing Restrictions: (P) No General: General OT Amount of Missed Time: 30 Minutes  Pain:denied      Therapy/Group: Individual Therapy  Alfredia Ferguson [facility] 10/21/2019, 2:40 PM

## 2019-10-21 NOTE — Progress Notes (Signed)
Physical Therapy Session Note  Patient Details  Name: Eric Larsen MRN: [id number] Date of Birth: 09/24/1930  Today's Date: 10/21/2019 PT Individual Time: 0800-0913 PT Individual Time Calculation (min): 73 min   Short Term Goals: Week 1:  PT Short Term Goal 1 (Week 1): Will be able to perform bed mobility with ModAx1 PT Short Term Goal 2 (Week 1): Will be able to stand for 5-7 minutes with BUE support and ModA PT Short Term Goal 3 (Week 1): Will be able to transfer with LRAD and ModAx1  Skilled Therapeutic Interventions/Progress Updates:  Pt was seen bedside in the am. Pt was sleeping but arouses to stimuli. Pt able to follow simple commands with increased processing time. Pt performed multiple rolls with side rail and max A to assist with donning pants. Pt transferred supine to edge of bed with side rail and max A with verbal cues. Pt transferred sit to stand with mod A and verbal cues. Pt transferred to w/c with max  A and verbal cues. Pt transported to rehab gym. Pt transferred w/c to mat with max A and verbal cues. Pt tolerated edge of mat for about 40 minutes with c/s to min A and verbal cues with UE support. Pt tends to lean posteriorly to the R able to correct with verbal and tactile cues. Pt ate breakfast sitting on edge of mat. Pt transferred mat to w/c with max A and verbal cues. Pt returned to room and left sitting up in w/c with seat belt alarm in place and call bell within reach.    Therapy Documentation Precautions:  Precautions Precautions: Fall Precaution Comments: dementia, slow processing/initiation/poor attention Restrictions Weight Bearing Restrictions: No General:   Pain: No c/o pain.    Therapy/Group: Individual Therapy  Dub Amis 10/21/2019, 9:12 AM

## 2019-10-21 NOTE — Progress Notes (Signed)
[facility] PHYSICAL MEDICINE & REHABILITATION PROGRESS NOTE  Subjective/Complaints: No complaints initially. When asked if he has pain, indicates pain in left shoulder and nods yes to applying lidocaine patch.  Nods yes to sleeping well at night.   ROS: Unable to assess due to cognition.  Objective: Vital Signs: Blood pressure 125/72, pulse 72, temperature 100.2 F (37.9 C), resp. rate 20, height 5\' 4"  (1.626 m), weight 57.2 kg, SpO2 96 %. DG Swallowing Func-Speech Pathology  Result Date: 10/20/2019 Objective Swallowing Evaluation: Type of Study: MBS-Modified Barium Swallow Study  Patient Details Name: Eric Larsen MRN: [id number] Date of Birth: 05-10-1931 Today's Date: 10/20/2019 Time: SLP Start Time (ACUTE ONLY): 1309 -SLP Stop Time (ACUTE ONLY): 1335 SLP Time Calculation (min) (ACUTE ONLY): 26 min Past Medical History: Past Medical History: Diagnosis Date . Hypothyroidism  . Osteoporosis  . Thyroid disease  Past Surgical History: Past Surgical History: Procedure Laterality Date . CHOLECYSTECTOMY   . HIP FRACTURE SURGERY   . INTRAMEDULLARY (IM) NAIL INTERTROCHANTERIC Right 08/13/2017  Procedure: INTRAMEDULLARY (IM) NAIL INTERTROCHANTRIC;  Surgeon: Earnestine Leys, MD;  Location: [facility] ORS;  Service: Orthopedics;  Laterality: Right; . SHOULDER SURGERY   HPI: [name] is an  86 y.o. yo male w/ PMH significant for hypothyroidism, hyperlipidemia, hereditary spherocytosis and recent diagnosis of dementia presenting with one week of progressively worsening confusion.  MRI brain on 2/28 reported: "Left temporal lobe is abnormal with increased signal on T2 and FLAIR and cortical thickening and medially and anteriorly. There is a small area of non masslike enhancement in the left anterior temporal lobe. The patient was febrile on admission. 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 Infiltrating tumor also in the differential.  Subjective: Pt was awake but confused Assessment / Plan /  Recommendation CHL IP CLINICAL IMPRESSIONS 10/20/2019 Clinical Impression Pt presents with severe oral dysphagia and mild to moderate pharyngeal dysphagia. Pt's oral phase is severely long (>20 seconds with single bolus of puree). Pt's cognitive deficits decrease his attention to all boluses and promotes oral holding. In addition when pt is attentive to bolus he has severely decreased lingual manipulation, decreased bolus cohesion, reduced posterior propulsion, premature spillage to vallecula and piecemeal swallows with nectar thick liquids, thin liquids and puree. Pt's pharyngeal phase is c/b reduced base of tongue strength, delayed swallow initiation to vallecula and reduced pharyngeal parestalsis. As a result, pt has moderate to severe vallecular residue throughout all consistencies. Pt's altered mental status prevents him from following any directions or using any compensatory strategies. Recommmend pt continue consuming puree diet with thin liquids, medicine crushed in puree and full nursing supervision to increase attention to bolus. SLP Visit Diagnosis -- Attention and concentration deficit following -- Frontal lobe and executive function deficit following -- Impact on safety and function Mild aspiration risk   CHL IP TREATMENT RECOMMENDATION 10/20/2019 Treatment Recommendations Therapy as outlined in treatment plan below   Prognosis 10/15/2019 Prognosis for Safe Diet Advancement Fair Barriers to Reach Goals Cognitive deficits;Language deficits Barriers/Prognosis Comment -- CHL IP DIET RECOMMENDATION 10/20/2019 SLP Diet Recommendations Dysphagia 1 (Puree) solids;Thin liquid Liquid Administration via Cup;Straw Medication Administration Crushed with puree Compensations Minimize environmental distractions;Slow rate;Small sips/bites Postural Changes Seated upright at 90 degrees   CHL IP OTHER RECOMMENDATIONS 10/20/2019 Recommended Consults -- Oral Care Recommendations Oral care BID Other Recommendations --   CHL IP FOLLOW  UP RECOMMENDATIONS 10/18/2019 Follow up Recommendations 24 hour supervision/assistance   CHL IP FREQUENCY AND DURATION 10/15/2019 Speech Therapy Frequency (ACUTE ONLY) min 2x/week Treatment  Duration 2 weeks      CHL IP ORAL PHASE 10/20/2019 Oral Phase Impaired Oral - Pudding Teaspoon -- Oral - Pudding Cup -- Oral - Honey Teaspoon -- Oral - Honey Cup -- Oral - Nectar Teaspoon Holding of bolus;Piecemeal swallowing;Premature spillage;Decreased bolus cohesion;Delayed oral transit;Weak lingual manipulation;Incomplete tongue to palate contact;Lingual/palatal residue;Reduced posterior propulsion Oral - Nectar Cup Incomplete tongue to palate contact;Weak lingual manipulation;Holding of bolus;Lingual/palatal residue;Piecemeal swallowing;Decreased bolus cohesion;Premature spillage;Delayed oral transit;Reduced posterior propulsion Oral - Nectar Straw -- Oral - Thin Teaspoon Weak lingual manipulation;Incomplete tongue to palate contact;Reduced posterior propulsion;Holding of bolus;Lingual/palatal residue;Piecemeal swallowing;Delayed oral transit;Decreased bolus cohesion;Premature spillage Oral - Thin Cup Weak lingual manipulation;Lingual pumping;Reduced posterior propulsion;Incomplete tongue to palate contact;Holding of bolus;Lingual/palatal residue;Piecemeal swallowing;Delayed oral transit;Decreased bolus cohesion;Premature spillage Oral - Thin Straw -- Oral - Puree Impaired mastication;Weak lingual manipulation;Incomplete tongue to palate contact;Reduced posterior propulsion;Holding of bolus;Lingual/palatal residue;Piecemeal swallowing;Delayed oral transit;Decreased bolus cohesion;Premature spillage Oral - Mech Soft -- Oral - Regular -- Oral - Multi-Consistency -- Oral - Pill -- Oral Phase - Comment --  CHL IP PHARYNGEAL PHASE 10/20/2019 Pharyngeal Phase Impaired Pharyngeal- Pudding Teaspoon -- Pharyngeal -- Pharyngeal- Pudding Cup -- Pharyngeal -- Pharyngeal- Honey Teaspoon -- Pharyngeal -- Pharyngeal- Honey Cup -- Pharyngeal --  Pharyngeal- Nectar Teaspoon Delayed swallow initiation-vallecula;Reduced pharyngeal peristalsis;Reduced tongue base retraction;Pharyngeal residue - valleculae;Pharyngeal residue - pyriform Pharyngeal -- Pharyngeal- Nectar Cup Delayed swallow initiation-vallecula;Reduced pharyngeal peristalsis;Reduced tongue base retraction;Pharyngeal residue - valleculae;Pharyngeal residue - pyriform Pharyngeal -- Pharyngeal- Nectar Straw -- Pharyngeal -- Pharyngeal- Thin Teaspoon Delayed swallow initiation-vallecula;Reduced pharyngeal peristalsis;Reduced tongue base retraction;Pharyngeal residue - valleculae;Pharyngeal residue - pyriform Pharyngeal -- Pharyngeal- Thin Cup Delayed swallow initiation-vallecula;Reduced pharyngeal peristalsis;Reduced tongue base retraction;Pharyngeal residue - pyriform;Pharyngeal residue - valleculae Pharyngeal -- Pharyngeal- Thin Straw Delayed swallow initiation-vallecula;Reduced pharyngeal peristalsis;Reduced tongue base retraction;Pharyngeal residue - valleculae;Pharyngeal residue - pyriform Pharyngeal -- Pharyngeal- Puree Delayed swallow initiation-vallecula;Reduced pharyngeal peristalsis;Reduced tongue base retraction;Pharyngeal residue - valleculae Pharyngeal -- Pharyngeal- Mechanical Soft -- Pharyngeal -- Pharyngeal- Regular -- Pharyngeal -- Pharyngeal- Multi-consistency -- Pharyngeal -- Pharyngeal- Pill -- Pharyngeal -- Pharyngeal Comment --  CHL IP CERVICAL ESOPHAGEAL PHASE 10/20/2019 Cervical Esophageal Phase WFL Pudding Teaspoon -- Pudding Cup -- Honey Teaspoon -- Honey Cup -- Nectar Teaspoon -- Nectar Cup -- Nectar Straw -- Thin Teaspoon -- Thin Cup -- Thin Straw -- Puree -- Mechanical Soft -- Regular -- Multi-consistency -- Pill -- Cervical Esophageal Comment -- [name] Overton 10/20/2019, 2:32 PM              Recent Labs    10/19/19 0516  WBC 10.1  HGB 11.8*  HCT 34.8*  PLT 221   Recent Labs    10/19/19 0516  NA 138  K 3.6  CL 104  CO2 28  GLUCOSE 106*  BUN 15  CREATININE  0.81  CALCIUM 9.0    Physical Exam: BP 125/72 (BP Location: Left Arm)   Pulse 72   Temp 100.2 F (37.9 C)   Resp 20   Ht 5\' 4"  (1.626 m)   Wt 57.2 kg   SpO2 96%   BMI 21.65 kg/m  Constitutional: No distress . Vital signs reviewed.  Frail. Sitting up in chair, poor posture, neck bent forward. Cachetic.  HENT: Normocephalic.  Atraumatic. Eyes: EOMI. No discharge. Cardiovascular: No JVD. Respiratory: Normal effort.  No stridor. GI: Non-distended. Skin: Warm and dry.  Intact. Psych: Unable to assess due to mentation.   Musc: No edema in extremities.  No tenderness in extremities. Neurological: Alert Motor: Appears to be 4+/5 throughout  Assessment/Plan: 1. 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  Physiatrist is providing close team supervision and 24 hour management of active medical problems listed below.  Physiatrist and rehab team continue to assess barriers to discharge/monitor patient progress toward functional and medical goals  Care Tool:  Bathing    Body parts bathed by patient: Right arm, Left arm, Chest, Abdomen, Face, Left upper leg, Right upper leg   Body parts bathed by helper: Left lower leg, Right lower leg, Front perineal area, Buttocks     Bathing assist Assist Level: 2 Helpers     Upper Body Dressing/Undressing Upper body dressing   What is the patient wearing?: Button up shirt    Upper body assist Assist Level: Total Assistance - Patient < 25%    Lower Body Dressing/Undressing Lower body dressing      What is the patient wearing?: Incontinence brief     Lower body assist Assist for lower body dressing: Total Assistance - Patient < 25%     Toileting Toileting    Toileting assist Assist for toileting: 2 Helpers     Transfers Chair/bed transfer  Transfers assist     Chair/bed transfer assist level: Maximal Assistance  - Patient 25 - 49%     Locomotion Ambulation   Ambulation assist      Assist level: 2 helpers Assistive device: Other (comment)(3 Land) Max distance: 71ft   Walk 10 feet activity   Assist     Assist level: 2 helpers Assistive device: Other (comment)(railing in the hallway)   Walk 50 feet activity   Assist Walk 50 feet with 2 turns activity did not occur: Safety/medical concerns(fatigued)         Walk 150 feet activity   Assist Walk 150 feet activity did not occur: Safety/medical concerns(fatigued)         Walk 10 feet on uneven surface  activity   Assist Walk 10 feet on uneven surfaces activity did not occur: Safety/medical concerns(fatigued)         Wheelchair     Assist Will patient use wheelchair at discharge?: Yes Type of Wheelchair: Manual    Wheelchair assist level: Dependent - Patient 0%      Wheelchair 50 feet with 2 turns activity    Assist        Assist Level: Dependent - Patient 0%   Wheelchair 150 feet activity     Assist     Assist Level: Dependent - Patient 0%      Medical Problem List and Plan: 1.  Cognitive deficits with unintelligible language, weakness, secondary to herpes encephalitis.  Continue CIR PT, OT, SLP             Cont IV acyclovir until 3/21 2.  Antithrombotics: -DVT/anticoagulation:  Pharmaceutical: Lovenox             -antiplatelet therapy: NA 3. Pain Management: PRN medications. Lidocaine patch ordered for left shoulder.  4. Mood: Team support             -antipsychotic agents: NA 5. Neuropsych: This patient is not capable of making decisions on his own behalf.  Telesitter for safety 6. Skin/Wound Care: Routine pressure relief measures.  7. Fluids/Electrolytes/Nutrition: Monitor I/O.   BMP within acceptable range on 3/4, labs ordered for Monday 8. Hypothyroid: Continue Synthroid 128mcg. 9. Epileptogenic potential: Conitnue Keppra 500 mg bid for seizure  No seizures from  admission-3/5  10. Hypokalemia: Resolved 11. Macrocytic anemia:   Hemoglobin 11.8 on 3/4, labs ordered for Monday  Continue to monitor 12.  Hypoalbuminemia  Supplement initiated on 3/4 13.  Severe Oral Dysphagia and mild to moderate pharyngeal dysphagia:   D1 thins, advance diet as tolerated  LOS: 3 days A FACE TO FACE EVALUATION WAS PERFORMED  Martha Clan P Ryanna Teschner 10/21/2019, 2:41 PM

## 2019-10-21 NOTE — Progress Notes (Signed)
Speech Language Pathology Daily Session Note  Patient Details  Name: Eric Larsen MRN: [id number] Date of Birth: 1931/03/11  Today's Date: 10/21/2019 SLP Individual Time: [id number] SLP Individual Time Calculation (min): 39 min  Short Term Goals: Week 1: SLP Short Term Goal 1 (Week 1): Pt will sustain attention to basic task for 3 minutes with Max A cues. SLP Short Term Goal 2 (Week 1): Pt will initiate basic familiar tasks in 2 out of 5 opportunities with Max A cues. SLP Short Term Goal 3 (Week 1): Pt will follow 1 step directions in 2 out of 5 opportunities with Max A cues. SLP Short Term Goal 4 (Week 1): Pt will participate in basic problem solving tasks targeting basic ADLs with Max A cues. SLP Short Term Goal 5 (Week 1): Pt will consume least restrictive diet with minimal s/s of dsyphagia and aspiration.  Skilled Therapeutic Interventions:  Pt was seen for skilled ST targeting goals for dysphagia and cognition.  Pt was asleep upon therapist's arrival, nursing reports that pt has been difficult to arouse today.  Pt awakened briefly to loud voice and was repositioned to sitting as upright as possible to further maximize alertness.  Pt required max to total assist to consume minimal presentations of dys 1, thin liquid textures due to lethargy and inattentiveness to boluses.  Alternating solids and liquids, hyolaryngeal palpation, and verbal cues were all utilized to minimize timing of oral delay.  Due to lethargy, pt was only able to sustain his attention to meal for ~30 second intervals.  [name] was present and had questions regarding the nature of pt's swallowing and cognitive decline.  Skilled education was provided regarding the complex mechanics of swallowing and the role that cognition and arousal play in ability to safely consume POs.  All questions were answered to her satisfaction at this time.  PT was left in bed with wife at bedside.  Continue per current plan of care.     Pain Pain Assessment Pain Scale: 0-10 Pain Score: 0-No pain  Therapy/Group: Individual Therapy  Eric Larsen, Selinda Orion 10/21/2019, 2:46 PM

## 2019-10-22 MED ORDER — METHYLPHENIDATE HCL 5 MG PO TABS
5.0000 mg | ORAL_TABLET | Freq: Every day | ORAL | Status: DC
  Administered 2019-10-22 – 2019-10-26 (×5): 5 mg via ORAL
  Filled 2019-10-22 (×5): qty 1

## 2019-10-22 NOTE — Progress Notes (Signed)
[place] PHYSICAL MEDICINE & REHABILITATION PROGRESS NOTE  Subjective/Complaints: Sleeping soundly this morning.  Lethargy is affecting therapy sessions.  Pain well controlled in sessions yesterday.  Max to total Ax2 in OT yesterday for all ADLs.   ROS: Unable to assess due to cognition.  Objective: Vital Signs: Blood pressure 106/65, pulse 83, temperature 97.8 F (36.6 C), temperature source Oral, resp. rate 16, height 5\' 4"  (1.626 m), weight 57.2 kg, SpO2 95 %. No results found. No results for input(s): WBC, HGB, HCT, PLT in the last 72 hours. No results for input(s): NA, K, CL, CO2, GLUCOSE, BUN, CREATININE, CALCIUM in the last 72 hours.  Physical Exam: BP 106/65 (BP Location: Left Arm)   Pulse 83   Temp 97.8 F (36.6 C) (Oral)   Resp 16   Ht 5\' 4"  (1.626 m)   Wt 57.2 kg   SpO2 95%   BMI 21.65 kg/m  Constitutional: No distress . Vital signs reviewed.  Frail. Sleeping in bed soundly.  HENT: Normocephalic.  Atraumatic. Eyes: EOMI. No discharge. Cardiovascular: No JVD. Respiratory: Normal effort.  No stridor. GI: Non-distended. Skin: Warm and dry.  Intact. Psych: Unable to assess due to mentation.   Musc: No edema in extremities.  No tenderness in extremities. Neurological: Alert Motor: Appears to be 4+/5 throughout   Assessment/Plan: 1. 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  Physiatrist is providing close team supervision and 24 hour management of active medical problems listed below.  Physiatrist and rehab team continue to assess barriers to discharge/monitor patient progress toward functional and medical goals  Care Tool:  Bathing    Body parts bathed by patient: Right arm, Left arm, Chest, Abdomen, Face, Left upper leg, Right upper leg   Body parts bathed by helper: Left lower leg, Right lower leg, Front perineal area, Buttocks      Bathing assist Assist Level: 2 Helpers     Upper Body Dressing/Undressing Upper body dressing   What is the patient wearing?: Button up shirt    Upper body assist Assist Level: Total Assistance - Patient < 25%    Lower Body Dressing/Undressing Lower body dressing      What is the patient wearing?: Incontinence brief     Lower body assist Assist for lower body dressing: Total Assistance - Patient < 25%     Toileting Toileting    Toileting assist Assist for toileting: Dependent - Patient 0%     Transfers Chair/bed transfer  Transfers assist     Chair/bed transfer assist level: Dependent - Patient 0%     Locomotion Ambulation   Ambulation assist      Assist level: 2 helpers Assistive device: Other (comment)(3 Land) Max distance: 52ft   Walk 10 feet activity   Assist     Assist level: 2 helpers Assistive device: Other (comment)(railing in the hallway)   Walk 50 feet activity   Assist Walk 50 feet with 2 turns activity did not occur: Safety/medical concerns(fatigued)         Walk 150 feet activity   Assist Walk 150 feet activity did not occur: Safety/medical concerns(fatigued)         Walk 10 feet on uneven surface  activity   Assist Walk 10 feet on uneven surfaces activity did not occur: Safety/medical concerns(fatigued)         Wheelchair     Assist Will patient use wheelchair at discharge?: Yes  Type of Wheelchair: Manual    Wheelchair assist level: Dependent - Patient 0%      Wheelchair 50 feet with 2 turns activity    Assist        Assist Level: Dependent - Patient 0%   Wheelchair 150 feet activity     Assist     Assist Level: Dependent - Patient 0%      Medical Problem List and Plan: 1.  Cognitive deficits with unintelligible language, weakness, secondary to herpes encephalitis.  Continue CIR PT, OT, SLP             Cont IV acyclovir until 3/21 2.  Antithrombotics: -DVT/anticoagulation:   Pharmaceutical: Lovenox             -antiplatelet therapy: NA 3. Pain Management: PRN medications. Lidocaine patch ordered for left shoulder. Pain better controlled.  4. Mood: Team support             -antipsychotic agents: NA 5. Neuropsych: This patient is not capable of making decisions on his own behalf.  Telesitter for safety 6. Skin/Wound Care: Routine pressure relief measures.  7. Fluids/Electrolytes/Nutrition: Monitor I/O.   BMP within acceptable range on 3/4, labs ordered for Monday 8. Hypothyroid: Continue Synthroid 169mcg. 9. Epileptogenic potential: Conitnue Keppra 500 mg bid for seizure  No seizures from admission-3/5 10. Hypokalemia: Resolved 11. Macrocytic anemia:   Hemoglobin 11.8 on 3/4, labs ordered for Monday  Continue to monitor 12.  Hypoalbuminemia  Supplement initiated on 3/4 13.  Severe Oral Dysphagia and mild to moderate pharyngeal dysphagia:   D1 thins, advance diet as tolerated 14. Lethargy/decreased attention: Medications reviewed and he is not taking any overly sedating medications. Will trial low dose Ritalin to improve energy and arousal as currently having difficulty engaging with therapy.   15. Severe kyphosis: position with pillows when sleeping and sitting in chair, placed nursing order.   LOS: 4 days A FACE TO FACE EVALUATION WAS PERFORMED  Clide Deutscher Tziporah Knoke 10/22/2019, 11:47 AM

## 2019-10-23 ENCOUNTER — Inpatient Hospital Stay (HOSPITAL_COMMUNITY): Payer: Medicare Other | Admitting: Speech Pathology

## 2019-10-23 ENCOUNTER — Inpatient Hospital Stay (HOSPITAL_COMMUNITY): Payer: Medicare Other

## 2019-10-23 ENCOUNTER — Inpatient Hospital Stay (HOSPITAL_COMMUNITY): Payer: Medicare Other | Admitting: Occupational Therapy

## 2019-10-23 DIAGNOSIS — R1312 Dysphagia, oropharyngeal phase: Secondary | ICD-10-CM

## 2019-10-23 DIAGNOSIS — G0490 Encephalitis and encephalomyelitis, unspecified: Secondary | ICD-10-CM

## 2019-10-23 DIAGNOSIS — D62 Acute posthemorrhagic anemia: Secondary | ICD-10-CM

## 2019-10-23 LAB — CBC
HCT: 31.2 % — ABNORMAL LOW (ref 39.0–52.0)
Hemoglobin: 10.7 g/dL — ABNORMAL LOW (ref 13.0–17.0)
MCH: 33.8 pg (ref 26.0–34.0)
MCHC: 34.3 g/dL (ref 30.0–36.0)
MCV: 98.4 fL (ref 80.0–100.0)
Platelets: 272 10*3/uL (ref 150–400)
RBC: 3.17 MIL/uL — ABNORMAL LOW (ref 4.22–5.81)
RDW: 21 % — ABNORMAL HIGH (ref 11.5–15.5)
WBC: 8.4 10*3/uL (ref 4.0–10.5)
nRBC: 0 % (ref 0.0–0.2)

## 2019-10-23 LAB — BASIC METABOLIC PANEL
Anion gap: 8 (ref 5–15)
BUN: 28 mg/dL — ABNORMAL HIGH (ref 8–23)
CO2: 29 mmol/L (ref 22–32)
Calcium: 9.2 mg/dL (ref 8.9–10.3)
Chloride: 102 mmol/L (ref 98–111)
Creatinine, Ser: 0.98 mg/dL (ref 0.61–1.24)
GFR calc Af Amer: >60 mL/min (ref >60)
GFR calc non Af Amer: >60 mL/min (ref >60)
Glucose, Bld: 129 mg/dL — ABNORMAL HIGH (ref 70–99)
Potassium: 3.7 mmol/L (ref 3.5–5.1)
Sodium: 139 mmol/L (ref 135–145)

## 2019-10-23 NOTE — Plan of Care (Signed)
  Problem: Consults Goal: RH GENERAL PATIENT EDUCATION Description: See Patient Education module for education specifics. Outcome: Progressing   Problem: RH BOWEL ELIMINATION Goal: RH STG MANAGE BOWEL WITH ASSISTANCE Description: STG Manage Bowel with Assistance. Mod  Outcome: Progressing   Problem: RH BLADDER ELIMINATION Goal: RH STG MANAGE BLADDER WITH ASSISTANCE Description: STG Manage Bladder With Assistance. Mod Outcome: Progressing   Problem: RH SKIN INTEGRITY Goal: RH STG MAINTAIN SKIN INTEGRITY WITH ASSISTANCE Description: STG Maintain Skin Integrity With Assistance. Mod Outcome: Progressing   Problem: RH SAFETY Goal: RH STG ADHERE TO SAFETY PRECAUTIONS W/ASSISTANCE/DEVICE Description: STG Adhere to Safety Precautions With Assistance/Device. Mod Outcome: Progressing   Problem: RH PAIN MANAGEMENT Goal: RH STG PAIN MANAGED AT OR BELOW PT'S PAIN GOAL Description: Less than 3 Outcome: Progressing

## 2019-10-23 NOTE — Progress Notes (Signed)
Physical Therapy Session Note  Patient Details  Name: Eric Larsen MRN: [id number] Date of Birth: 1931/02/03  Today's Date: 10/23/2019 PT Individual Time: 1300-1350 PT Individual Time Calculation (min): 50 min  and Today's Date: 10/23/2019 PT Co-Treatment Time: 1350-1400(1350-1415 Co-treat with OT) PT Co-Treatment Time Calculation (min): 10 min  Short Term Goals: Week 1:  PT Short Term Goal 1 (Week 1): Will be able to perform bed mobility with ModAx1 PT Short Term Goal 2 (Week 1): Will be able to stand for 5-7 minutes with BUE support and ModA PT Short Term Goal 3 (Week 1): Will be able to transfer with LRAD and ModAx1  Skilled Therapeutic Interventions/Progress Updates:   Received pt supine in bed eating lunch with wife present at bedside, pt agreeable to therapy, and reported minor pain but was unable to verbalize pain location. Repositioning and distraction done to reduce pain. Session focused on functional mobility/transfers, LE strength, dynamic standing balance, and improved endurance with activity. Discussed home setup with wife and wife provided therapist with pictures of home setup. Pt transferred supine<>R sidelying with max A and verbal/tactile cues for hand placement on bedrail and LE management. While sitting EOB pt donned jacket with total assist. Pt transferred stand<>pivot bed<>WC total assist +2 without AD. Pt transported to gym in [facility] for time management purposes. Pt with extreme kyphosis sitting in WC. Worked on scapular retractions with mod A for manual facilitation of upright posture. Attempted sit<>stand max A in hallway with 1 rail on R however, pt unable to stand up and with strong posterior lean. Pt required max verbal cues for technique, hand placement on WC, anterior weight shifting, and to scoot hips forward in chair. Pt required extensive time due to poor motor planning. OT present for co-treatment. Pt transferred sit<>stand x3 trials with table in front for support max  A +2. While standing OT worked on Producer, television/film/video for visual feedback while PT focused on bilateral knee extension, anterior weight shifting, and general standing endurance with mod A to maintain standing balance. Pt required multiple rest breaks throughout session due to increased fatigue. Noted pt with soiled pants. Transported back to room total assist and performed stand<>pivot WC<>bed total assist +2 and sit<>supine mod A. Pt required total assist to doff soiled brief, perform peri-care, and to don clean incontinence brief. Concluded session with pt supine in bed, needs within reach, and bed alarm on.   Therapy Documentation Precautions:  Precautions Precautions: Fall Precaution Comments: dementia, slow processing/initiation/poor attention Restrictions Weight Bearing Restrictions: No  Therapy/Group: Individual Therapy Alfonse Alpers PT, DPT   10/23/2019, 7:39 AM

## 2019-10-23 NOTE — Progress Notes (Signed)
Speech Language Pathology Daily Session Note  Patient Details  Name: Eric Larsen MRN: [id number] Date of Birth: 1930/09/17  Today's Date: 10/23/2019 SLP Individual Time: [id number] SLP Individual Time Calculation (min): 50 min  Short Term Goals: Week 1: SLP Short Term Goal 1 (Week 1): Pt will sustain attention to basic task for 3 minutes with Max A cues. SLP Short Term Goal 2 (Week 1): Pt will initiate basic familiar tasks in 2 out of 5 opportunities with Max A cues. SLP Short Term Goal 3 (Week 1): Pt will follow 1 step directions in 2 out of 5 opportunities with Max A cues. SLP Short Term Goal 4 (Week 1): Pt will participate in basic problem solving tasks targeting basic ADLs with Max A cues. SLP Short Term Goal 5 (Week 1): Pt will consume least restrictive diet with minimal s/s of dsyphagia and aspiration.  Skilled Therapeutic Interventions: Pt was seen for skilled ST intervention targeting goals for Attention, initiation, following commands, basic problem solving, initiation, and diet tolerance. Pt was sleeping upon arrival of SLP. Extended multimodal cueing, including application of wet washcloth was required to maintain alertness initially. Pt frequently stated "no" or "Stop it lady" during this process. Wet voice quality noted upon arousal, however pt was unable to follow commands to clear throat or cough. Following oral care, pt was given bolus of applesauce, which he held orally. Material was suctioned from pt's mouth. Trials of orange magic cup (1/2tsp boluses) were accepted, and swallowed without oral holding, however, amount accepted decreased by pt using tongue to prevent spoon from being put in his mouth. Pt then began saying "no" when spoon was brought toward him.  Intermittent wet voice quality and weak nonproductive cough noted throughout session. Pt required ongoing multimodal cues to maintain attention to task, and only initiated saying "no" or shaking his head. Pt was able to  answer simple immediate environment yes/no questions. He was unable to tell me his name or his wife's name. Most utterances were unintelligible, dysfluent mutterings.  Pt follows one step commands inconsistently, frequently shaking his head or verbalizing "no".   Pt was left in bed with alarm on, all needs within reach. Continue ST per current plan of care.   Pain Pain Assessment Pain Scale: Faces Pain Score: 0-No pain Faces Pain Scale: No hurt  Therapy/Group: Individual Therapy   Tressie Ragin B. Quentin Ore, [facility], CCC-SLP Speech Language Pathologist  Shonna Chock 10/23/2019, 9:39 AM

## 2019-10-23 NOTE — Progress Notes (Signed)
Occupational Therapy Session Note  Patient Details  Name: Eric Larsen MRN: [id number] Date of Birth: Jul 20, 1931  Today's Date: 10/23/2019 OT Co-Treatment Time: 1400-1415(entire co-treat 1350-1415) OT Co-Treatment Time Calculation (min): 15 min   Short Term Goals: Week 1:  OT Short Term Goal 1 (Week 1): Pt will maintain static sitting balance with supervision for at least 3 mins in perparation for selfcare tasks. OT Short Term Goal 2 (Week 1): Pt will complete UB bathing with no more than min instructional cueing for initiation and sequencing. OT Short Term Goal 3 (Week 1): Pt will complete LB bathing sit to stand with max assist and use of AE PRN. OT Short Term Goal 4 (Week 1): Pt will complete toilet transfer with max assist using RW for support. OT Short Term Goal 5 (Week 1): Pt will donn a pullover shirt with no more than min assist in supported sitting for two consecutive trials.  Skilled Therapeutic Interventions/Progress Updates:    Co-treat with PT with focus on sit > stand, upright standing posture, and sequencing during sit > stand and functional tasks.  Utilized Geologist, engineering for visual feedback to attempt to facilitate increased upright standing posture, therapist placing hand on forehead and shoulders to facilitate more upright head/neck in sitting and standing to allow PT to engage pt in standing and reaching activity.  Returned to room, pt incontinent of bladder.  Squat pivot transfer total +2 back to bed.  Engaged in rolling for hygiene with pt requiring mod-max assist for rolling and total assist for clothing management.  Therapy Documentation Precautions:  Precautions Precautions: Fall Precaution Comments: dementia, slow processing/initiation/poor attention Restrictions Weight Bearing Restrictions: No Pain:  Pt with no c/o pain   Therapy/Group: Co-Treatment  Eric Larsen 10/23/2019, 3:23 PM

## 2019-10-23 NOTE — Progress Notes (Signed)
Occupational Therapy Session Note  Patient Details  Name: Eric Larsen MRN: [id number] Date of Birth: 1930/08/29  Today's Date: 10/23/2019 OT Individual Time: 1000-1057 OT Individual Time Calculation (min): 57 min    Short Term Goals: Week 1:  OT Short Term Goal 1 (Week 1): Pt will maintain static sitting balance with supervision for at least 3 mins in perparation for selfcare tasks. OT Short Term Goal 2 (Week 1): Pt will complete UB bathing with no more than min instructional cueing for initiation and sequencing. OT Short Term Goal 3 (Week 1): Pt will complete LB bathing sit to stand with max assist and use of AE PRN. OT Short Term Goal 4 (Week 1): Pt will complete toilet transfer with max assist using RW for support. OT Short Term Goal 5 (Week 1): Pt will donn a pullover shirt with no more than min assist in supported sitting for two consecutive trials.  Skilled Therapeutic Interventions/Progress Updates:    Treatment session with focus on attention to task, sequencing during familiar self-care tasks, transfers, and upright sitting balance.  Pt received supine in bed agreeable to therapy session.  Donned pants in bed with pt able to lift BLE from bed to allow therapist to thread pants, pt able to pull pants up legs with increased time.  Completed bed mobility to sit at EOB with max assist and mod-max to maintain sitting balance.  Completed sit > stand with therapist seated in arm chair in front of pt to facilitate weight shift and then pulled pants over hips total assist.  Completed squat pivot transfer to Lt with total assist.  Engaged in oral care in sitting at sink with setup and max multimodal cues for sequencing and ultimately requiring assist for completion of oral care due to decreased sequencing this session.  Therapist washed pt's hair at sink while encouraging upright sitting posture and attention to task.  Returned to bed squat pivot transfer to Rt with total +2 assist due to  decreased weight shifting and sequencing transferring to Rt.  Returned to semi-reclined in bed with all needs in reach.  Therapy Documentation Precautions:  Precautions Precautions: Fall Precaution Comments: dementia, slow processing/initiation/poor attention Restrictions Weight Bearing Restrictions: No Pain: Pain Assessment Pain Scale: Faces Faces Pain Scale: No hurt  Therapy/Group: Individual Therapy  [name] 10/23/2019, 12:23 PM

## 2019-10-23 NOTE — Progress Notes (Signed)
[place] PHYSICAL MEDICINE & REHABILITATION PROGRESS NOTE  Subjective/Complaints: Patient seen laying in bed this AM.  No reported issues overnight.  Sleep chart not updated.  ROS: Unable to assess due to cognition  Objective: Vital Signs: Blood pressure 105/60, pulse 76, temperature 98.2 F (36.8 C), temperature source Oral, resp. rate 16, height 5\' 4"  (1.626 m), weight 57.2 kg, SpO2 97 %. No results found. Recent Labs    10/23/19 0458  WBC 8.4  HGB 10.7*  HCT 31.2*  PLT 272   Recent Labs    10/23/19 0458  NA 139  K 3.7  CL 102  CO2 29  GLUCOSE 129*  BUN 28*  CREATININE 0.98  CALCIUM 9.2    Physical Exam: BP 105/60 (BP Location: Left Arm)   Pulse 76   Temp 98.2 F (36.8 C) (Oral)   Resp 16   Ht 5\' 4"  (1.626 m)   Wt 57.2 kg   SpO2 97%   BMI 21.65 kg/m  Constitutional: No distress . Vital signs reviewed.  Frail. HENT: Normocephalic.  Atraumatic. Eyes: EOMI. No discharge. Cardiovascular: No JVD. Respiratory: Normal effort.  No stridor. GI: Non-distended. Skin: Warm and dry.  Intact. Psych: Unable to assess due to mentation Musc: No edema in extremities.  No tenderness in extremities. Neurological: Alert  Motor: Appears to be 4+/5 throughout, unchanged   Assessment/Plan: 1. 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  Physiatrist is providing close team supervision and 24 hour management of active medical problems listed below.  Physiatrist and rehab team continue to assess barriers to discharge/monitor patient progress toward functional and medical goals  Care Tool:  Bathing    Body parts bathed by patient: Right arm, Left arm, Chest, Abdomen, Face, Left upper leg, Right upper leg   Body parts bathed by helper: Left lower leg, Right lower leg, Front perineal area, Buttocks     Bathing assist Assist Level: 2 Helpers     Upper Body  Dressing/Undressing Upper body dressing   What is the patient wearing?: Button up shirt    Upper body assist Assist Level: Total Assistance - Patient < 25%    Lower Body Dressing/Undressing Lower body dressing      What is the patient wearing?: Incontinence brief     Lower body assist Assist for lower body dressing: Total Assistance - Patient < 25%     Toileting Toileting    Toileting assist Assist for toileting: Dependent - Patient 0%     Transfers Chair/bed transfer  Transfers assist     Chair/bed transfer assist level: Dependent - Patient 0%     Locomotion Ambulation   Ambulation assist      Assist level: 2 helpers Assistive device: Other (comment)(3 Land) Max distance: 62ft   Walk 10 feet activity   Assist     Assist level: 2 helpers Assistive device: Other (comment)(railing in the hallway)   Walk 50 feet activity   Assist Walk 50 feet with 2 turns activity did not occur: Safety/medical concerns(fatigued)         Walk 150 feet activity   Assist Walk 150 feet activity did not occur: Safety/medical concerns(fatigued)         Walk 10 feet on uneven surface  activity   Assist Walk 10 feet on uneven surfaces activity did not occur: Safety/medical concerns(fatigued)         Wheelchair     Assist  Will patient use wheelchair at discharge?: Yes Type of Wheelchair: Manual    Wheelchair assist level: Dependent - Patient 0%      Wheelchair 50 feet with 2 turns activity    Assist        Assist Level: Dependent - Patient 0%   Wheelchair 150 feet activity     Assist     Assist Level: Dependent - Patient 0%      Medical Problem List and Plan: 1.  Cognitive deficits with unintelligible language, weakness, secondary to herpes encephalitis.  Continue CIR             Cont IV acyclovir until 3/21 2.  Antithrombotics: -DVT/anticoagulation:  Pharmaceutical: Lovenox             -antiplatelet therapy: NA 3.  Pain Management: PRN medications. Lidocaine patch ordered for left shoulder. Pain better controlled.  4. Mood: Team support             -antipsychotic agents: NA 5. Neuropsych: This patient is not capable of making decisions on his own behalf.  Telesitter for safety  See #14 6. Skin/Wound Care: Routine pressure relief measures.  7. Fluids/Electrolytes/Nutrition: Monitor I/O.   BMP within acceptable range except for glucose on 3/8 8. Hypothyroid: Continue Synthroid 14mcg. 9. Epileptogenic potential: Conitnue Keppra 500 mg bid for seizure  No seizures from admission-3/8 10. Hypokalemia: Resolved 11. Macrocytic anemia:   Hemoglobin 10.7 on 3/8  Vitamin B12 within normal limits  Folate ordered  Hemoccult ordered  Continue to monitor 12.  Hypoalbuminemia  Supplement initiated on 3/4 13.  Severe Oral Dysphagia and mild to moderate pharyngeal dysphagia:   D1 thins, advance diet as tolerated 14. Lethargy/decreased attention: Medications reviewed and he is not taking any overly sedating medications.   Trial low dose Ritalin to improve energy and arousal as currently having difficulty engaging with therapy.   15. Severe kyphosis: position with pillows when sleeping and sitting in chair, placed nursing order.   LOS: 5 days A FACE TO FACE EVALUATION WAS PERFORMED  Sian Joles Lorie Phenix 10/23/2019, 8:57 AM

## 2019-10-24 ENCOUNTER — Inpatient Hospital Stay (HOSPITAL_COMMUNITY): Payer: Medicare Other

## 2019-10-24 ENCOUNTER — Inpatient Hospital Stay (HOSPITAL_COMMUNITY): Payer: Medicare Other | Admitting: Speech Pathology

## 2019-10-24 ENCOUNTER — Inpatient Hospital Stay (HOSPITAL_COMMUNITY): Payer: Medicare Other | Admitting: Occupational Therapy

## 2019-10-24 LAB — FOLATE RBC
Folate, Hemolysate: 577 ng/mL
Folate, RBC: 1911 ng/mL (ref >498)
Hematocrit: 30.2 % — ABNORMAL LOW (ref 37.5–51.0)

## 2019-10-24 NOTE — Plan of Care (Signed)
  Problem: Consults Goal: RH GENERAL PATIENT EDUCATION Description: See Patient Education module for education specifics. Outcome: Progressing   Problem: RH BOWEL ELIMINATION Goal: RH STG MANAGE BOWEL WITH ASSISTANCE Description: STG Manage Bowel with Assistance. Mod  Outcome: Progressing   Problem: RH BLADDER ELIMINATION Goal: RH STG MANAGE BLADDER WITH ASSISTANCE Description: STG Manage Bladder With Assistance. Mod Outcome: Progressing   Problem: RH SKIN INTEGRITY Goal: RH STG MAINTAIN SKIN INTEGRITY WITH ASSISTANCE Description: STG Maintain Skin Integrity With Assistance. Mod Outcome: Progressing   Problem: RH SAFETY Goal: RH STG ADHERE TO SAFETY PRECAUTIONS W/ASSISTANCE/DEVICE Description: STG Adhere to Safety Precautions With Assistance/Device. Mod Outcome: Progressing   Problem: RH PAIN MANAGEMENT Goal: RH STG PAIN MANAGED AT OR BELOW PT'S PAIN GOAL Description: Less than 3 Outcome: Progressing

## 2019-10-24 NOTE — Progress Notes (Signed)
Occupational Therapy Session Note  Patient Details  Name: Eric Larsen MRN: [id number] Date of Birth: 07-02-1931  Today's Date: 10/24/2019 OT Individual Time: 1300-1345 OT Individual Time Calculation (min): 45 min    Short Term Goals: Week 1:  OT Short Term Goal 1 (Week 1): Pt will maintain static sitting balance with supervision for at least 3 mins in perparation for selfcare tasks. OT Short Term Goal 2 (Week 1): Pt will complete UB bathing with no more than min instructional cueing for initiation and sequencing. OT Short Term Goal 3 (Week 1): Pt will complete LB bathing sit to stand with max assist and use of AE PRN. OT Short Term Goal 4 (Week 1): Pt will complete toilet transfer with max assist using RW for support. OT Short Term Goal 5 (Week 1): Pt will donn a pullover shirt with no more than min assist in supported sitting for two consecutive trials.  Skilled Therapeutic Interventions/Progress Updates:    Treatment session with focus on bed mobility, sitting balance, and initiation with self-feeding.  Pt received upright in bed with nurse tech present to assess vitals.  Noted to have been incontinent of urine.  Engaged in rolling with max assist and multimodal cues for hand placement and sequencing to complete hygiene.  Max assist +2 to come to sitting EOB.  Engaged in self-feeding while seated EOB.  Therapist providing mod-max facilitation at lower trunk for upright posture as pt quite kyphotic.  Hand over hand to place spoon in mouth, alternating pt feeding and therapist feeding to increase success.  Returned to semi-reclined due to fatigue and pt's wife resumed assisting pt with feeding.  Therapy Documentation Precautions:  Precautions Precautions: Fall Precaution Comments: dementia, slow processing/initiation/poor attention Restrictions Weight Bearing Restrictions: No General:   Vital Signs: Therapy Vitals Temp: 98.6 F (37 C) Temp Source: Oral Pulse Rate: 88 Resp:  19 BP: 121/70 Patient Position (if appropriate): Lying Oxygen Therapy SpO2: 98 % O2 Device: Room Air Pain:  Pt with c/o pain in back with prolonged sitting, repositioned.   Therapy/Group: Individual Therapy  [name] 10/24/2019, 3:58 PM

## 2019-10-24 NOTE — Progress Notes (Signed)
Eric Larsen PHYSICAL MEDICINE & REHABILITATION PROGRESS NOTE  Subjective/Complaints: Patient seen lying in bed this morning.  No reported issues overnight.  Sleep chart not updated.  He answers "yes" to all questions.  ROS: Unable to assess due to cognition  Objective: Vital Signs: Blood pressure 136/75, pulse 84, temperature 98.3 F (36.8 C), temperature source Oral, resp. rate 15, height 5\' 4"  (1.626 m), weight 57.2 kg, SpO2 94 %. No results found. Recent Labs    10/23/19 0458  WBC 8.4  HGB 10.7*  HCT 31.2*  PLT 272   Recent Labs    10/23/19 0458  NA 139  K 3.7  CL 102  CO2 29  GLUCOSE 129*  BUN 28*  CREATININE 0.98  CALCIUM 9.2    Physical Exam: BP 136/75 (BP Location: Left Arm)   Pulse 84   Temp 98.3 F (36.8 C) (Oral)   Resp 15   Ht 5\' 4"  (1.626 m)   Wt 57.2 kg   SpO2 94%   BMI 21.65 kg/m  Constitutional: No distress . Vital signs reviewed.  Frail. HENT: Normocephalic.  Atraumatic. Eyes: EOMI. No discharge. Cardiovascular: No JVD. Respiratory: Normal effort.  No stridor. GI: Non-distended. Skin: Warm and dry.  Intact. Psych: Unable to assess due to mentation  Musc: No edema in extremities.  No tenderness in extremities. Neurological: Alert Motor: Appears to be 4+/5 throughout, stable  Assessment/Plan: 1. 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  Physiatrist is providing close team supervision and 24 hour management of active medical problems listed below.  Physiatrist and rehab team continue to assess barriers to discharge/monitor patient progress toward functional and medical goals  Care Tool:  Bathing    Body parts bathed by patient: Right arm, Left arm, Chest, Abdomen, Face, Left upper leg, Right upper leg   Body parts bathed by helper: Left lower leg, Right lower leg, Front perineal area, Buttocks     Bathing assist Assist  Level: 2 Helpers     Upper Body Dressing/Undressing Upper body dressing   What is the patient wearing?: Pull over shirt    Upper body assist Assist Level: Maximal Assistance - Patient 25 - 49%    Lower Body Dressing/Undressing Lower body dressing      What is the patient wearing?: Incontinence brief     Lower body assist Assist for lower body dressing: Total Assistance - Patient < 25%     Toileting Toileting    Toileting assist Assist for toileting: Dependent - Patient 0%     Transfers Chair/bed transfer  Transfers assist     Chair/bed transfer assist level: 2 Helpers     Locomotion Ambulation   Ambulation assist      Assist level: 2 helpers Assistive device: Other (comment)(3 Land) Max distance: 1ft   Walk 10 feet activity   Assist     Assist level: 2 helpers Assistive device: Other (comment)(railing in the hallway)   Walk 50 feet activity   Assist Walk 50 feet with 2 turns activity did not occur: Safety/medical concerns(fatigued)         Walk 150 feet activity   Assist Walk 150 feet activity did not occur: Safety/medical concerns(fatigued)         Walk 10 feet on uneven surface  activity   Assist Walk 10 feet on uneven surfaces activity did not occur: Safety/medical concerns(fatigued)         Wheelchair  Assist Will patient use wheelchair at discharge?: Yes Type of Wheelchair: Manual    Wheelchair assist level: Dependent - Patient 0%      Wheelchair 50 feet with 2 turns activity    Assist        Assist Level: Dependent - Patient 0%   Wheelchair 150 feet activity     Assist     Assist Level: Dependent - Patient 0%      Medical Problem List and Plan: 1.  Cognitive deficits with unintelligible language, weakness, secondary to herpes encephalitis.  Continue CIR             Cont IV acyclovir until 3/21 2.  Antithrombotics: -DVT/anticoagulation:  Pharmaceutical: Lovenox              -antiplatelet therapy: NA 3. Pain Management: PRN medications. Lidocaine patch ordered for left shoulder. Pain better controlled.  4. Mood: Team support             -antipsychotic agents: NA 5. Neuropsych: This patient is not capable of making decisions on his own behalf.  Telesitter for safety  See #14 6. Skin/Wound Care: Routine pressure relief measures.  7. Fluids/Electrolytes/Nutrition: Monitor I/O.   BMP within acceptable range except for glucose on 3/8 8. Hypothyroid: Continue Synthroid 188mcg. 9. Epileptogenic potential: Conitnue Keppra 500 mg bid for seizure  No seizures from admission-3/9 10. Hypokalemia: Resolved 11. Macrocytic anemia:   Hemoglobin 10.7 on 2/8  Vitamin B12 within normal limits  Folate pending  Hemoccult pending  Continue to monitor 12.  Hypoalbuminemia  Supplement initiated on 3/4 13.  Severe Oral Dysphagia and mild to moderate pharyngeal dysphagia:   D1 thins, advance diet as tolerated 14. Lethargy/decreased attention: Medications reviewed and he is not taking any overly sedating medications.   Trial low dose Ritalin to improve energy and arousal as currently having difficulty engaging with therapy.   15. Severe kyphosis: position with pillows when sleeping and sitting in chair, placed nursing order.   LOS: 6 days A FACE TO FACE EVALUATION WAS PERFORMED  Eric Larsen Lorie Phenix 10/24/2019, 9:01 AM

## 2019-10-24 NOTE — Progress Notes (Signed)
Speech Language Pathology Daily Session Note  Patient Details  Name: Eric Larsen MRN: [id number] Date of Birth: 1931/01/22  Today's Date: 10/24/2019 SLP Individual Time: 0900-(P) 0955 SLP Individual Time Calculation (min): (P) 55 min  Short Term Goals: Week 1: SLP Short Term Goal 1 (Week 1): Pt will sustain attention to basic task for 3 minutes with Max A cues. SLP Short Term Goal 2 (Week 1): Pt will initiate basic familiar tasks in 2 out of 5 opportunities with Max A cues. SLP Short Term Goal 3 (Week 1): Pt will follow 1 step directions in 2 out of 5 opportunities with Max A cues. SLP Short Term Goal 4 (Week 1): Pt will participate in basic problem solving tasks targeting basic ADLs with Max A cues. SLP Short Term Goal 5 (Week 1): Pt will consume least restrictive diet with minimal s/s of dsyphagia and aspiration.  Skilled Therapeutic Interventions: Pt was seen for skilled ST intervention targeting goals for attention, initiation, direction following, basic problem solving, and diet tolerance. Pt was pleasant and cooperative, and more alert than yesterday. Pt was sitting in wheelchair upon arrival of SLP. No family present at this time.  SLP facilitated session by providing Max asssist to complete basic ADLs - oral care with suction -  Pt was unable to verbalize or demonstrate steps. Max hand over hand assist. Max multimodal cues for application of  lotion. Pt able to answer simple yes and no questions, accurate for birth month, unable to tell me current month or year. Pt accepted a few bites of magic cup - weak throat clear and anterior leakage noted intermittently. Repetition and multimodal cues required for direction following.   Pt was left in wheelchair with alarm on, all needs within reach. Continue ST per current plan of care.   Pain Pain Assessment Pain Scale: 0-10 Pain Score: 0-No pain  Therapy/Group: Individual Therapy   Eric Larsen, [facility], CCC-SLP Speech Language  Pathologist  Shonna Chock 10/24/2019, 9:58 AM

## 2019-10-24 NOTE — Progress Notes (Signed)
Patient's wife concerned about patient's puffy under eyelids. Discussed with provider patient's poor nutritional intake and was instructed to give patient a boost/ensure and put warm compress on his eyes.

## 2019-10-24 NOTE — Progress Notes (Signed)
Physical Therapy Session Note  Patient Details  Name: Eric Larsen MRN: [id number] Date of Birth: February 18, 1931  Today's Date: 10/24/2019 PT Individual Time: 0800-0900 and 1445-1532 PT Individual Time Calculation (min): 60 min and 47 min  Short Term Goals: Week 1:  PT Short Term Goal 1 (Week 1): Will be able to perform bed mobility with ModAx1 PT Short Term Goal 2 (Week 1): Will be able to stand for 5-7 minutes with BUE support and ModA PT Short Term Goal 3 (Week 1): Will be able to transfer with LRAD and ModAx1  Skilled Therapeutic Interventions/Progress Updates:   Treatment Session 1: 0800-0900 60 min Received pt supine in bed, pt agreeable to therapy, and denied any pain during session. Session focused on functional mobility/transfers, LE strength, dynamic sitting/standing balance, motor control/planning, and improved endurance with activity. Pt with soiled brief. Doffed soiled brief and donned clean one with +2 assist. Pt rolled to L and R with max A and tactile cues for hand placement on bedrails and required total assist +2 to pull pants over hips. Pt transferred supine<>sitting EOB with total assist. Pt transferred bed<>WC squat<>pivot total assist +2 for safety. Pt with decreased initiation with transfers and poor motor planning despite max verbal and tactile cues. Pt transported to dayroom in [facility] total assist. Pt transferred sit<>stand x 3 trials total assist +2. Trial 1: attempted standing 3 musketeer style and Trial: 2/3 with table in front of pt. Pt with strong posterior lean and difficulty with hip extension resulting in pt "sitting in air" with +2 assist to remain up. Worked on achieving midline while sitting in [place]. Pt required max verbal and tactile cues to shift to R and to extend neck to look in mirror. Pt worked on dynamic sitting balance in [place] using mirror for visual feeback and maximal cues for upright posture and alignment. Worked on dynamic reaching otuside BOS for cones however  pt with increased difficulty with motor planning to reach for cones (only able to reach out and grab 1/2 cones). Pt transported back to room in [facility] total assist. Pt required total assist to reposition hips in WC. Concluded session with pt sitting in WC, needs within reach, and seatbelt alarm on. Therapist provided blanket for pt.   Treatment Session 2: 1445-1532 47 min Received pt supine in bed asleep, pt required max verbal stimuli to wake. Pt agreeable to therapy, and denied any pain during session. Session focused on functional mobility/transfers, LE strength, dynamic standing balance, motor control/planning, and improved endurance with activity. Pt rolled to L with max A and manual facilitation to position in hooklying. In L sidelying worked on R LE motor control to perform clamshell exercise however, pt with decreased motor control/planning and unable to perform. Pt transferred supine<>sitting EOB max A and required mod A fading to CGA to maintain static sitting balance. Pt transferred sit<>stand x1 +2 assist and blocking LEs to prevent pushing posteriorly for approximately 1 minute with max tactile cues to promote hip extension and neck extension with verbal cues for pt to look up at wife. Pt with continued strong posterior lean. Pt declined standing again due to increased fatigue. Worked on dynamic sitting balance reaching outside BOS to grab washcloth and hand to wife. Pt able to grab washcloth 1/10 trials and with increased difficulty motor planning to hand washcloth to wife. Pt transferred sit<>supine +2 assist for trunk and LE management. Pt repositioned in sidelying with pillows underneath for pressure relief. Concluded session with pt supine in bed, needs  within reach, and bed alarm on.    Therapy Documentation Precautions:  Precautions Precautions: Fall Precaution Comments: dementia, slow processing/initiation/poor attention Restrictions Weight Bearing Restrictions: No  Therapy/Group:  Individual Therapy Alfonse Alpers PT, DPT   10/24/2019, 7:30 AM

## 2019-10-24 NOTE — Progress Notes (Signed)
Patient's daughter (from [place]) called asking about wanting to get to a conflict resolution with the family. Charge RN was notified and spoke to the daughter. The daughter was told only the provider was able to give them information per unit secretary.

## 2019-10-24 NOTE — Progress Notes (Signed)
Pharmacy Antimicrobial Note  Eric Larsen is a 86 y.o. male admitted on 10/18/2019 with altered mental status.  Pharmacy has been consulted for Acyclovir dosing for HSV 1 encephalitis. WBC WNL. CSF positive for HSV 1, Day 10 of antiviral medication. Renal function is stable. Plan to treat for 3 weeks through 3/21.   Plan: Continue Acyclovir 500mg  IV q8h Monitor WBC, temp, & renal function   Height: 5\' 4"  (162.6 cm) Weight: 126 lb 1.7 oz (57.2 kg) IBW/kg (Calculated) : 59.2  Temp (24hrs), Avg:98.4 F (36.9 C), Min:97.6 F (36.4 C), Max:99.2 F (37.3 C)  Recent Labs  Lab 10/19/19 0516 10/23/19 0458  WBC 10.1 8.4  CREATININE 0.81 0.98    Estimated Creatinine Clearance: 43.8 mL/min (by C-G formula based on SCr of 0.98 mg/dL).    No Known Allergies  Thank you for involving pharmacy in this patient's care.  Renold Genta, PharmD, BCPS Clinical Pharmacist Clinical phone for 10/24/2019 until 3p is [phone] 10/24/2019 1:06 PM  **Pharmacist phone directory can be found on [email or web address] listed under [place]**

## 2019-10-25 ENCOUNTER — Inpatient Hospital Stay (HOSPITAL_COMMUNITY): Payer: Medicare Other | Admitting: Occupational Therapy

## 2019-10-25 ENCOUNTER — Inpatient Hospital Stay (HOSPITAL_COMMUNITY): Payer: Medicare Other | Admitting: Speech Pathology

## 2019-10-25 ENCOUNTER — Inpatient Hospital Stay (HOSPITAL_COMMUNITY): Payer: Medicare Other

## 2019-10-25 NOTE — Progress Notes (Signed)
Speech Language Pathology Daily Session Note  Patient Details  Name: Eric Larsen MRN: [id number] Date of Birth: June 02, 1931  Today's Date: 10/25/2019 SLP Individual Time: 1005-1100 SLP Individual Time Calculation (min): 55 min  Short Term Goals: Week 1: SLP Short Term Goal 1 (Week 1): Pt will sustain attention to basic task for 3 minutes with Max A cues. SLP Short Term Goal 2 (Week 1): Pt will initiate basic familiar tasks in 2 out of 5 opportunities with Max A cues. SLP Short Term Goal 3 (Week 1): Pt will follow 1 step directions in 2 out of 5 opportunities with Max A cues. SLP Short Term Goal 4 (Week 1): Pt will participate in basic problem solving tasks targeting basic ADLs with Max A cues. SLP Short Term Goal 5 (Week 1): Pt will consume least restrictive diet with minimal s/s of dysphagia and aspiration.  Skilled Therapeutic Interventions: Pt was seen for skilled ST intervention targeting aforementioned goals. Pt was more alert today, and made eye contact with SLP several times during this session. Pt initiated unzipping his jacket with min verbal cues, and was able to tell me his first name (unable to state last name). Pt continues to answer yes/no questions with increasing consistency, but was unable to participate in administration of MMSE due to language of confusion. He counted from 1-10, but was unable to count backwards from 10. Pt was provided with trials of graham cracker/pudding combination. He required cues to remain quiet with food in his mouth, but exhibited good attention to bolus without oral holding or residue. Recommend seeing pt at lunch with dys 2 solids, as this advanced consistency may encourage po intake.  Pt was left in wheelchair with alarm on, all needs within reach. Wife present during part of session today. Continue ST per current plan of care.  Pain Pain Assessment Pain Scale: Faces Pain Score: 0-No pain  Therapy/Group: Individual Therapy   Ayasha Ellingsen B.  Quentin Ore, [facility], [place] Speech Language Pathologist  Eric Larsen 10/25/2019, 12:12 PM

## 2019-10-25 NOTE — Progress Notes (Signed)
Occupational Therapy Session Note  Patient Details  Name: Eric Larsen MRN: [id number] Date of Birth: 27-Mar-1931  Today's Date: 10/25/2019 OT Individual Time: [id number] OT Individual Time Calculation (min): 55 min    Short Term Goals: Week 1:  OT Short Term Goal 1 (Week 1): Pt will maintain static sitting balance with supervision for at least 3 mins in perparation for selfcare tasks. OT Short Term Goal 2 (Week 1): Pt will complete UB bathing with no more than min instructional cueing for initiation and sequencing. OT Short Term Goal 3 (Week 1): Pt will complete LB bathing sit to stand with max assist and use of AE PRN. OT Short Term Goal 4 (Week 1): Pt will complete toilet transfer with max assist using RW for support. OT Short Term Goal 5 (Week 1): Pt will donn a pullover shirt with no more than min assist in supported sitting for two consecutive trials.  Skilled Therapeutic Interventions/Progress Updates:    Treatment session with focus on functional transfers, sit > stand, and upright standing posture.  Pt received supine in bed agreeable to therapy session.  Pt with language of confusion throughout session, requiring increased time to allow pt to attempt to communicate.  Completed bed mobility with max assist to come to sitting EOB.  Donned sweatshirt seated EOB with max multimodal cues for technique.  Squat pivot transfer to Rt with total assist, +2 for safety.  Engaged in sit > stand at table to provide UE support while standing to promote more upright posture.  Transitioned to attempting sit > stand with therapist seated in front of pt in arm chair with sheet around pt hips to facilitate upright posture in standing.  Engaged in sit > stand x3 with facilitation at hips for more upright posture, also utilizing mirror for visual feedback.  Pt tolerated standing 5-10 seconds at a time before requiring seated rest break.  Returned to room and transferred back to bed with +2 assist due to  fatigue.  Positioned pt in sidelying to allow for pressure relief on back.  Therapy Documentation Precautions:  Precautions Precautions: Fall Precaution Comments: dementia, slow processing/initiation/poor attention Restrictions Weight Bearing Restrictions: No Pain:  Pt with no c/o pain   Therapy/Group: Individual Therapy  [name] 10/25/2019, 3:13 PM

## 2019-10-25 NOTE — Patient Care Conference (Signed)
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 Lethargy   . Acute blood loss anemia   . Hypoalbuminemia due to protein-calorie malnutrition ([place])   . Microcytic anemia   . Dysphagia   . Encephalitis 10/18/2019  . Abnormal EEG   . Hypothyroidism   . Macrocytic anemia   . 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 Hip fracture ([place]) 08/12/2017  . Hallux valgus of left foot 04/28/2017  . Hammer toe of left foot 04/28/2017  . Corn of toe 04/28/2017  . History of total left hip replacement 09/19/2015  . Spherocytosis (familial) ([place]) 09/17/2015  . Mixed hyperlipidemia 06/19/2015  . Benign prostatic hyperplasia with lower urinary tract symptoms 06/19/2015  . History of fracture of vertebra 11/06/2014  . History of colon polyps 08/08/2014  . Pathological fracture 11/01/2013  . Toenail fungus 01/20/2013  . Vitamin D deficiency 09/27/2012  . Primary hypothyroidism 09/27/2012  . Osteoporosis 09/27/2012  . Spherocytosis ([place]) 12/04/2011  . Skin cancer 12/04/2011    Expected Discharge Date: Expected Discharge Date: 11/11/19  Team Members Present: Physician leading conference: Dr. Delice Lesch Care Coodinator Present: Nestor Lewandowsky, RN, BSN, CRRN;Genie Natausha Jungwirth, RN, MSN Nurse Present: Other (comment)(Kristy Lovena Le, RN) PT Present: Becky Sax, PT OT Present: [name], OT SLP Present: Colon Flattery, SLP PPS Coordinator present : Gunnar Fusi, SLP     Current Status/Progress Goal Weekly Team Focus   Bowel/Bladder   patient is incontinent of bowel and bladder, LBM 10/20/19  obtain continence of bowel and bladder, and have regular pattern of BM  q shift and PRN toileting   Swallow/Nutrition/ Hydration   Max A with cues required not to hold food orally  Max A  try to advance to Dys2   ADL's   Max - total +2 for bathing/dressing, Max - total +2 squat pivot transfers, mod-max assist for sitting balance/posture  Min A  ADL retraining, activity tolerance, sitting balance/posture, pt/family education   Mobility   bed mobility max A, bed<>chair transfers total assist +2, sit<>stand max/+2 assist, ambulation 4ft +2 assist  min A  functional mobility/bed mobility, transfers, motor control/planning, dynamic standing balance, endurance   Communication   Max A - language of confusion, able to answer yes/no questions, but was unable to complete MMSE today.  Max A  following directions, initiation, attention   Safety/Cognition/ Behavioral Observations  Max A  Max A  basic cognition   Pain   patient denies pain but when moving in bed observed facial grimicing and moaning  decrease pain during mobility and transfer  q shift and PRN pain assessment   Skin   patient has no skin issues  maintain good skin integrity  q shift and PRN skin assessment    Rehab Goals Patient on target to meet rehab goals: Yes Rehab Goals Revised: Goals set at Minimal assistance *See Care Plan and progress notes for long and short-term goals.     Barriers to Discharge  Current Status/Progress Possible Resolutions Date Resolved   Nursing                  PT  Decreased caregiver support;Incontinence;Home environment access/layout  3 STE              OT                  SLP Decreased caregiver support              SW Decreased caregiver support;Incontinence;Behavior;IV antibiotics Wife reports her husband can be stubborn and when he wants to do , he will however if he does not, he will not coorperate Wife reports  she can manage physical assistance however is concerned about mental state and cognitive issues          Discharge Planning/Teaching Needs:  Plan to go home with wife ? SNF if needs more care than wife can provide  TBD   Team Discussion: On antivirals until 3/21, abnormal EEG, med started, monitoring labs, started Ritalin over the weekend.  RN BM today.  OT alert, slow to initiate, max/tot +2 all B/D/transfers, max sitting balance, hand over hand feeding, wife here, min A goals, may have to downgrade goals.  PT max bed, tot/max+2 transfers, amb 8' +2 on eval, posterior lean, max +2 sit to stand, min A goals.  SLP language of confusion, goals max A, on puree diet, trials of D2.  May need SNF at DC.   Revisions to Treatment Plan: N/A     Medical Summary Current Status: Cognitive deficits with unintelligible language, weakness, secondary to herpes encephalitis Weekly Focus/Goal: Improve mobility, cognition, anemia, dysphagia, abnormal EEG  Barriers to Discharge: Medical stability;Other (comments)  Barriers to Discharge Comments: IV antiviral Possible Resolutions to Barriers: Therapies, follow labs, cont antiepilectics   Continued Need for Acute Rehabilitation Level of Care: The patient requires daily medical management by a physician with specialized training in physical medicine and rehabilitation for the following reasons: Direction of a multidisciplinary physical rehabilitation program to maximize functional independence : Yes Medical management of patient stability for increased activity during participation in an intensive rehabilitation regime.: Yes Analysis of laboratory values and/or radiology reports with any subsequent need for medication adjustment and/or medical intervention. : Yes   I attest that I was present, lead the team conference, and concur with the assessment and plan of the team.   Retta Diones 10/26/2019, 3:50 PM   Team conference was held via web/ teleconference  due to [place] - 19

## 2019-10-25 NOTE — Progress Notes (Signed)
[facility] PHYSICAL MEDICINE & REHABILITATION PROGRESS NOTE  Subjective/Complaints: Patient seen laying in bed this AM.  No reported issues overnight.  He has slightly increased interaction with minimal appropriate spontaneous responses.   ROS: Unable to assess due to cognition.  Objective: Vital Signs: Blood pressure (!) 137/98, pulse 72, temperature (!) 97.5 F (36.4 C), temperature source Oral, resp. rate 15, height 5\' 4"  (1.626 m), weight 57.2 kg, SpO2 97 %. No results found. Recent Labs    10/23/19 0458 10/23/19 1118  WBC 8.4  --   HGB 10.7*  --   HCT 31.2* 30.2*  PLT 272  --    Recent Labs    10/23/19 0458  NA 139  K 3.7  CL 102  CO2 29  GLUCOSE 129*  BUN 28*  CREATININE 0.98  CALCIUM 9.2    Physical Exam: BP (!) 137/98 (BP Location: Right Arm)   Pulse 72   Temp (!) 97.5 F (36.4 C) (Oral)   Resp 15   Ht 5\' 4"  (1.626 m)   Wt 57.2 kg   SpO2 97%   BMI 21.65 kg/m  Constitutional: No distress . Vital signs reviewed. Frail.  HENT: Normocephalic.  Atraumatic. Eyes: EOMI. No discharge. Cardiovascular: No JVD. Respiratory: Normal effort.  No stridor. GI: Non-distended. Skin: Warm and dry.  Intact. Psych: Unable to assess due to mentation Musc: No edema in extremities.  No tenderness in extremities. Neurological: Alert Motor: Appears to be 4+/5 throughout, unchanged  Assessment/Plan: 1. 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  Physiatrist is providing close team supervision and 24 hour management of active medical problems listed below.  Physiatrist and rehab team continue to assess barriers to discharge/monitor patient progress toward functional and medical goals  Care Tool:  Bathing    Body parts bathed by patient: Right arm, Left arm, Chest, Abdomen, Face, Left upper leg, Right upper leg   Body parts bathed by helper: Left lower leg,  Right lower leg, Front perineal area, Buttocks     Bathing assist Assist Level: 2 Helpers     Upper Body Dressing/Undressing Upper body dressing   What is the patient wearing?: Pull over shirt    Upper body assist Assist Level: Maximal Assistance - Patient 25 - 49%    Lower Body Dressing/Undressing Lower body dressing      What is the patient wearing?: Incontinence brief     Lower body assist Assist for lower body dressing: 2 Helpers     Toileting Toileting    Toileting assist Assist for toileting: Dependent - Patient 0%     Transfers Chair/bed transfer  Transfers assist     Chair/bed transfer assist level: 2 Helpers     Locomotion Ambulation   Ambulation assist      Assist level: 2 helpers Assistive device: Other (comment)(3 Land) Max distance: 22ft   Walk 10 feet activity   Assist     Assist level: 2 helpers Assistive device: Other (comment)(railing in the hallway)   Walk 50 feet activity   Assist Walk 50 feet with 2 turns activity did not occur: Safety/medical concerns(fatigued)         Walk 150 feet activity   Assist Walk 150 feet activity did not occur: Safety/medical concerns(fatigued)         Walk 10 feet on uneven surface  activity   Assist Walk 10 feet on uneven surfaces activity did not occur:  Safety/medical concerns(fatigued)         Wheelchair     Assist Will patient use wheelchair at discharge?: Yes Type of Wheelchair: Manual    Wheelchair assist level: Dependent - Patient 0%      Wheelchair 50 feet with 2 turns activity    Assist        Assist Level: Dependent - Patient 0%   Wheelchair 150 feet activity     Assist     Assist Level: Dependent - Patient 0%      Medical Problem List and Plan: 1.  Cognitive deficits with unintelligible language, weakness, secondary to herpes encephalitis.  Continue CIR             Cont IV acyclovir until 3/21  Team conference today to discuss  current and goals and coordination of care, home and environmental barriers, and discharge planning with nursing, case manager, and therapies.  2.  Antithrombotics: -DVT/anticoagulation:  Pharmaceutical: Lovenox             -antiplatelet therapy: NA 3. Pain Management: PRN medications. Lidocaine patch ordered for left shoulder. Pain better controlled.  4. Mood: Team support             -antipsychotic agents: NA 5. Neuropsych: This patient is not capable of making decisions on his own behalf.  Telesitter for safety  See #14 6. Skin/Wound Care: Routine pressure relief measures.  7. Fluids/Electrolytes/Nutrition: Monitor I/O.   BMP within acceptable range except for glucose on 3/8 8. Hypothyroid: Continue Synthroid 133mcg. 9. Epileptogenic potential: Conitnue Keppra 500 mg bid for seizure  No seizures from admission-3/10 10. Hypokalemia: Resolved 11. Macrocytic anemia:   Hemoglobin 10.7 on 3/8, will order labs for the end of the week  Vitamin B12 within normal limits  Folate WNL  Hemoccult pending  Continue to monitor 12.  Hypoalbuminemia  Supplement initiated on 3/4 13.  Severe Oral Dysphagia and mild to moderate pharyngeal dysphagia:   D1 thins, advance diet as tolerated 14. Lethargy/decreased attention: Medications reviewed and he is not taking any overly sedating medications.   Trial low dose Ritalin to improve energy and arousal as currently having difficulty engaging with therapy.   15. Severe kyphosis: position with pillows when sleeping and sitting in chair, placed nursing order.   LOS: 7 days A FACE TO FACE EVALUATION WAS PERFORMED  Rielly Corlett Lorie Phenix 10/25/2019, 8:15 AM

## 2019-10-25 NOTE — Progress Notes (Signed)
Physical Therapy Session Note  Patient Details  Name: Eric Larsen MRN: [id number] Date of Birth: Oct 25, 1930  Today's Date: 10/25/2019 PT Individual Time: [id number] and 1100-1125 PT Individual Time Calculation (min): 45 min and 25 min  Short Term Goals: Week 1:  PT Short Term Goal 1 (Week 1): Will be able to perform bed mobility with ModAx1 PT Short Term Goal 2 (Week 1): Will be able to stand for 5-7 minutes with BUE support and ModA PT Short Term Goal 3 (Week 1): Will be able to transfer with LRAD and ModAx1  Skilled Therapeutic Interventions/Progress Updates:   Treatment Session 1: [id number] 45 min Received pt supine in bed, pt agreeable to therapy, and denied any pain during session. Session focused on functional mobility/transfers, LE strength, motor control/planning, dynamic standing balance, and improved endurance with activity. Pt with soiled incontinence brief. Pt required total assist +2 to doff soiled incontinence brief, perform peri-care, and don clean incontinence brief. Pt rolled to L and R max A and required total assist +2 to pull pants over hips. Pt transferred supine<>sitting EOB max A. While sitting EOB RN administered medication while therapist provided mod A for sitting balance. Pt transferred bed<>WC via slideboard total assist +2. Pt with poor motor control/planning and unable to follow commands for anterior weight shifting, hand placement, and technique to assist with initiation of transfer. Donned pull over shirt total assist while sitting in WC. Pt transported to dayroom in [facility] total assist. Found new tilt in space WC for pt but did not have time to transfer pt over to new chair due to time constraints from tornado drill. Pt transported to hallway outside of room total assist per protocol. While seated in [facility] therapist assisted with donning jacket max A during tornado drill. Worked on Cytogeneticist specifically with knee flexion practicing putting on and taking LE  on and off legrests. Pt required max verbal cues and mod A for motor planning. Concluded session with pt sitting in WC, needs within reach, and seatbelt alarm on.   Treatment Session 2: 1100-1125 25 min Received pt sitting in [facility] with wife present at bedside, pt agreeable to therapy, and denied any pain during session. Session focused on functional mobility/transfers, LE strength, dynamic sitting balance, and improved endurance with activity. Pt transferred WC<>bed and bed<>TIS WC via slideboard with total assist +2 for safety. Pt with decreased initiation with transfer and unable to anteriorly weight shift despite cues. Pt required +2 assist to reposition hips in TIS WC. Pt performed bilateral knee extension and bilateral hip flexion 1x6 while seated in TIS WC with verbal and tactile cues for technique and motor planning. Concluded session with pt semi-reclined in TIS WC, needs within reach, and seatbelt alarm on.    Therapy Documentation Precautions:  Precautions Precautions: Fall Precaution Comments: dementia, slow processing/initiation/poor attention Restrictions Weight Bearing Restrictions: No   Therapy/Group: Individual Therapy Alfonse Alpers PT, DPT   10/25/2019, 7:34 AM

## 2019-10-26 ENCOUNTER — Inpatient Hospital Stay (HOSPITAL_COMMUNITY): Payer: Medicare Other

## 2019-10-26 ENCOUNTER — Inpatient Hospital Stay (HOSPITAL_COMMUNITY): Payer: Medicare Other | Admitting: Occupational Therapy

## 2019-10-26 ENCOUNTER — Inpatient Hospital Stay (HOSPITAL_COMMUNITY): Payer: Medicare Other | Admitting: Speech Pathology

## 2019-10-26 DIAGNOSIS — R5383 Other fatigue: Secondary | ICD-10-CM

## 2019-10-26 NOTE — Progress Notes (Signed)
Physical Therapy Weekly Progress Note  Patient Details  Name: Eric Larsen MRN: [id number] Date of Birth: September 07, 1930  Beginning of progress report period: October 19, 2019 End of progress report period: October 26, 2019  Today's Date: 10/26/2019 PT Individual Time: 4818-5631 PT Individual Time Calculation (min): 39 min   Patient has met 1 of 3 short term goals. Pt demonstrates recent increased progress with therapy, however continues to be limited by poor motor control/planning, decreased initiation and ability to follow commands, bilateral LE weakness, and decreased activity tolerance. Pt currently requires max A to roll L/R, max A to transfer supine<>sitting EOB, max A of 1 to transfer bed<>WC, min/mod A to transfer sit<>stand with RW, and is currently ambulating 58f with RW mod A+2 for safety/equipment. [name] visits every day and is very involved with care and has had practice assisting therapist with transfers/exercises during sessions.   Patient continues to demonstrate the following deficits muscle weakness and decreased sitting balance, decreased standing balance, decreased postural control and decreased balance strategies and therefore will continue to benefit from skilled PT intervention to increase functional independence with mobility.  Patient progressing toward long term goals..  Continue plan of care.  PT Short Term Goals Week 1:  PT Short Term Goal 1 (Week 1): Will be able to perform bed mobility with ModAx1 PT Short Term Goal 1 - Progress (Week 1): Progressing toward goal PT Short Term Goal 2 (Week 1): Will be able to stand for 5-7 minutes with BUE support and ModA PT Short Term Goal 2 - Progress (Week 1): Met PT Short Term Goal 3 (Week 1): Will be able to transfer with LRAD and ModAx1 PT Short Term Goal 3 - Progress (Week 1): Progressing toward goal Week 2:  PT Short Term Goal 1 (Week 2): Will be able to perform bed mobility mod A PT Short Term Goal 2 (Week 2): Pt will  perform bed<>chair transfer with LRAD mod A PT Short Term Goal 3 (Week 2): Pt will transfer sit<>stand with LRAD min A  Skilled Therapeutic Interventions/Progress Updates:  Ambulation/gait training;DME/adaptive equipment instruction;Neuromuscular re-education;Stair training;UE/LE Strength taining/ROM;Discharge planning;Skin care/wound management;Therapeutic Activities;UE/LE Coordination activities;Cognitive remediation/compensation;Disease management/prevention;Functional mobility training;Patient/family education;Splinting/orthotics;Therapeutic Exercise;Visual/perceptual remediation/compensation;Balance/vestibular training   Today's Interventions: Received pt sitting in TIS WC, pt agreeable to therapy, and denied any pain during session. [name] present at bedside. Session focused on functional mobility/transfers, LE strength, ambulation, dynamic standing balance, motor control/planning, NMR, and improved endurance with activity. Adjusted let rests to TIS for pt comfort. Pt ambulated 89fwith RW min/mod A +2 for equipment/WC follow. Pt required extensive time and max verbal cues for step sequence, RW safety, and technique. Pt transferred sit<>stand with RW x 2 trials max A. Pt initially with strong posterior lean but able to achieve upright position with increased time and cues while standing. While standing pt performed bilateral alternating marching x 6 reps with verbal and tactile cues for increased hip flexion. Pt required max A to scoot hips back in WC, however pt did attempt to assist with initiation of movement. Pt transferred sit<>stand with RW and worked on dynamic reaching outside BOS at various angles to reach for wife's hand x 4 reps. Pt required increased time and max verbal cues to reach and mod A to maintain static standing balance due to posterior lean. Concluded session with pt sitting in WC, needs within reach, and seatbelt alarm on.   Therapy Documentation Precautions:   Precautions Precautions: Fall Precaution Comments: dementia, slow processing/initiation/poor attention Restrictions Weight  Bearing Restrictions: No  Therapy/Group: Individual Therapy Alfonse Alpers PT, DPT   10/26/2019, 7:46 AM

## 2019-10-26 NOTE — Plan of Care (Signed)
  Problem: Consults Goal: RH GENERAL PATIENT EDUCATION Description: See Patient Education module for education specifics. Outcome: Progressing   Problem: RH BOWEL ELIMINATION Goal: RH STG MANAGE BOWEL WITH ASSISTANCE Description: STG Manage Bowel with Assistance. Mod  Outcome: Progressing   Problem: RH BLADDER ELIMINATION Goal: RH STG MANAGE BLADDER WITH ASSISTANCE Description: STG Manage Bladder With Assistance. Mod Outcome: Progressing   Problem: RH SKIN INTEGRITY Goal: RH STG MAINTAIN SKIN INTEGRITY WITH ASSISTANCE Description: STG Maintain Skin Integrity With Assistance. Mod Outcome: Progressing   Problem: RH SAFETY Goal: RH STG ADHERE TO SAFETY PRECAUTIONS W/ASSISTANCE/DEVICE Description: STG Adhere to Safety Precautions With Assistance/Device. Mod Outcome: Progressing   Problem: RH PAIN MANAGEMENT Goal: RH STG PAIN MANAGED AT OR BELOW PT'S PAIN GOAL Description: Less than 3 Outcome: Progressing

## 2019-10-26 NOTE — Progress Notes (Addendum)
Speech Language Pathology Daily Session Note  Patient Details  Name: Eric Larsen MRN: [id number] Date of Birth: [birth year]  Today's Date: 10/26/2019 SLP Individual Time: 1220-1305 SLP Individual Time Calculation (min): 45 min  Short Term Goals: Week 1: SLP Short Term Goal 1 (Week 1): Pt will sustain attention to basic task for 3 minutes with Max A cues. SLP Short Term Goal 2 (Week 1): Pt will initiate basic familiar tasks in 2 out of 5 opportunities with Max A cues. SLP Short Term Goal 3 (Week 1): Pt will follow 1 step directions in 2 out of 5 opportunities with Max A cues. SLP Short Term Goal 4 (Week 1): Pt will participate in basic problem solving tasks targeting basic ADLs with Max A cues. SLP Short Term Goal 5 (Week 1): Pt will consume least restrictive diet with minimal s/s of dsyphagia and aspiration.  Skilled Therapeutic Interventions:  Skilled treatment session trageted dysphagia and congition goals as well as dedicated caregiver education. SLP provided skilled observation of pt consuming trial tray of dysphagia 2 diet lunch tray as ordered by treating therapist on previous day. Pt with difficulty clearing dysphagia 2 boluses from oral cavity d/t incessant talking. Pt unable to be redirected to task of eating. With Mod A verbal cues, pt able to increase attention to residue and clear with extended time. Per wife's report, pt "has always been a slow eater."  In contrast pt consumed pureed items with complete oral clearing and more timely oral phase. Education provided to pt's wife with examples provided with dysphagia 2 items and puree items. [name] is very attentive to pt's needs and this also distractions pt's attention to oral bolus. Given that pt is able to clear oral residue when cued, will continue current diet with full nursing supervision and absolute silent during consumption. Education provided to pt's wife and nursing. Despite Total A cues and use of a timer, pt had  difficulty not talking for 1 minute in order to consume POs. Pt resistant to cues to continue consuming d/t continued off-topic comments. Pt's cognitive deficits continue to impede amount consumed. Pt handed off to PT.       Pain    Therapy/Group: Individual Therapy  Eric Larsen 10/26/2019, 4:21 PM

## 2019-10-26 NOTE — Progress Notes (Signed)
Occupational Therapy Session Note  Patient Details  Name: Eric Larsen MRN: [id number] Date of Birth: 1930-09-27  Today's Date: 10/26/2019 OT Individual Time: 0930-1030 OT Individual Time Calculation (min): 60 min    Short Term Goals: Week 1:  OT Short Term Goal 1 (Week 1): Pt will maintain static sitting balance with supervision for at least 3 mins in perparation for selfcare tasks. OT Short Term Goal 2 (Week 1): Pt will complete UB bathing with no more than min instructional cueing for initiation and sequencing. OT Short Term Goal 3 (Week 1): Pt will complete LB bathing sit to stand with max assist and use of AE PRN. OT Short Term Goal 4 (Week 1): Pt will complete toilet transfer with max assist using RW for support. OT Short Term Goal 5 (Week 1): Pt will donn a pullover shirt with no more than min assist in supported sitting for two consecutive trials.  Skilled Therapeutic Interventions/Progress Updates:    1:1 Pt did GREAT today. Pt did continue with language of confusion and nonsense words. Pt was alert and ready to participate. Pt able to wash periare and buttocks in sidelying with A for positioning and cues. Pt bridging in bed to assist Ot with donning new brief and pulling up pants. Pt came to EOB with bed rail with more than reasonable time and mod cues to continue with task but could successfully get to EOB with contact guard (with extra time!!). Pt able to sit EOB with contact guard while RN threaded IV through shirt and donned with min A to pull over trunk. Pt transferred into regular armed chair (for presentation of regular chair instead of TIS w/c) with contact guard again with more than reasonable amt of time. Pt participated in hair brushing and face washing with min A at the sink. Pt performed bilateral hand held ( on each side) ambulation in the hallway 3 doors down the hallway with min A - pt able to come into standing with contact guard with cues to continue with task. Pt  turned and sat in chair positioned in hallway. Then pt agreed to walk more. Pt again performed sit to stand with extra time with contact guard. RW provided and ambulated another 25 feet with RW with contact guard/ min A again at a slow pace. 2nd person present in session for safety and to manage IV pole but pt with much improved functional mobility today. A pt with shaving today. Left resting in the TIS w/c.  Therapy Documentation Precautions:  Precautions Precautions: Fall Precaution Comments: dementia, slow processing/initiation/poor attention Restrictions Weight Bearing Restrictions: No Pain: Pain Assessment Pain Scale: 0-10 Pain Score: 0-No pain   Therapy/Group: Individual Therapy  [name] [facility] 10/26/2019, 10:54 AM

## 2019-10-26 NOTE — Progress Notes (Signed)
[place] PHYSICAL MEDICINE & REHABILITATION PROGRESS NOTE  Subjective/Complaints: Patient seen laying in bed this morning.  No reported issues overnight.  He states "yes" to all questions.  ROS: Unable to assess due to cognition  Objective: Vital Signs: Blood pressure (!) 145/94, pulse 91, temperature 97.9 F (36.6 C), temperature source Oral, resp. rate 18, height 5\' 4"  (1.626 m), weight 48.1 kg, SpO2 100 %. No results found. Recent Labs    10/23/19 1118  HCT 30.2*   No results for input(s): NA, K, CL, CO2, GLUCOSE, BUN, CREATININE, CALCIUM in the last 72 hours.  Physical Exam: BP (!) 145/94 (BP Location: Left Arm)   Pulse 91   Temp 97.9 F (36.6 C) (Oral)   Resp 18   Ht 5\' 4"  (1.626 m)   Wt 48.1 kg Comment: 106  SpO2 100%   BMI 18.19 kg/m  Constitutional: No distress . Vital signs reviewed.  Frail. HENT: Normocephalic.  Atraumatic. Eyes: EOMI. No discharge. Cardiovascular: No JVD. Respiratory: Normal effort.  No stridor. GI: Non-distended. Skin: Warm and dry.  Intact. Psych: Unable to assess due to mentation Musc: No edema in extremities.  No tenderness in extremities. Neurological: Easily arousable Motor: Appears to be 4+/5 throughout, stable  Assessment/Plan: 1. 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  Physiatrist is providing close team supervision and 24 hour management of active medical problems listed below.  Physiatrist and rehab team continue to assess barriers to discharge/monitor patient progress toward functional and medical goals  Care Tool:  Bathing    Body parts bathed by patient: Right arm, Left arm, Chest, Abdomen, Face, Left upper leg, Right upper leg   Body parts bathed by helper: Left lower leg, Right lower leg, Front perineal area, Buttocks     Bathing assist Assist Level: 2 Helpers     Upper Body  Dressing/Undressing Upper body dressing   What is the patient wearing?: Pull over shirt    Upper body assist Assist Level: Total Assistance - Patient < 25%    Lower Body Dressing/Undressing Lower body dressing      What is the patient wearing?: Incontinence brief, Pants     Lower body assist Assist for lower body dressing: 2 Halliday assist Assist for toileting: Dependent - Patient 0%     Transfers Chair/bed transfer  Transfers assist     Chair/bed transfer assist level: 2 Helpers     Locomotion Ambulation   Ambulation assist      Assist level: 2 helpers Assistive device: Other (comment)(3 Land) Max distance: 51ft   Walk 10 feet activity   Assist     Assist level: 2 helpers Assistive device: Other (comment)(railing in the hallway)   Walk 50 feet activity   Assist Walk 50 feet with 2 turns activity did not occur: Safety/medical concerns(fatigued)         Walk 150 feet activity   Assist Walk 150 feet activity did not occur: Safety/medical concerns(fatigued)         Walk 10 feet on uneven surface  activity   Assist Walk 10 feet on uneven surfaces activity did not occur: Safety/medical concerns(fatigued)         Wheelchair     Assist Will patient use wheelchair at discharge?: Yes Type of Wheelchair: Manual    Wheelchair assist level: Dependent - Patient 0%  Wheelchair 50 feet with 2 turns activity    Assist        Assist Level: Dependent - Patient 0%   Wheelchair 150 feet activity     Assist     Assist Level: Dependent - Patient 0%      Medical Problem List and Plan: 1.  Cognitive deficits with unintelligible language, weakness, secondary to herpes encephalitis.  Continue CIR             Cont IV acyclovir until 3/21 2.  Antithrombotics: -DVT/anticoagulation:  Pharmaceutical: Lovenox             -antiplatelet therapy: NA 3. Pain Management: PRN  medications. Lidocaine patch ordered for left shoulder. Pain better controlled.  4. Mood: Team support             -antipsychotic agents: NA 5. Neuropsych: This patient is not capable of making decisions on his own behalf.  Telesitter for safety  See #14 6. Skin/Wound Care: Routine pressure relief measures.  7. Fluids/Electrolytes/Nutrition: Monitor I/O.   BMP within acceptable range except for glucose on 3/8 8. Hypothyroid: Continue Synthroid 175mcg. 9. Epileptogenic potential: Conitnue Keppra 500 mg bid for seizure  No seizures from admission-3/11 10. Hypokalemia: Resolved 11. Macrocytic anemia:   Hemoglobin 10.7 on 3/8, labs ordered for tomorrow  Vitamin B12 within normal limits  Folate WNL  Hemoccult remains pending  Continue to monitor 12.  Hypoalbuminemia  Supplement initiated on 3/4 13.  Severe Oral Dysphagia and mild to moderate pharyngeal dysphagia:   D1 thins, advance diet as tolerated 14. Lethargy/decreased attention: Medications reviewed and he is not taking any overly sedating medications.   Methylphenidate DC'd  Sleep chart ordered 15. Severe kyphosis: position with pillows when sleeping and sitting in chair, placed nursing order.  16.  Labile blood pressure  Monitor for trend  LOS: 8 days A FACE TO FACE EVALUATION WAS PERFORMED  Karan Ramnauth Lorie Phenix 10/26/2019, 8:34 AM

## 2019-10-26 NOTE — Progress Notes (Signed)
Occupational Therapy Session Note  Patient Details  Name: Eric Larsen MRN: [id number] Date of Birth: 29-Jul-1931  Today's Date: 10/26/2019 OT Individual Time: 1415-1500 OT Individual Time Calculation (min): 45 min    Short Term Goals: Week 1:  OT Short Term Goal 1 (Week 1): Pt will maintain static sitting balance with supervision for at least 3 mins in perparation for selfcare tasks. OT Short Term Goal 2 (Week 1): Pt will complete UB bathing with no more than min instructional cueing for initiation and sequencing. OT Short Term Goal 3 (Week 1): Pt will complete LB bathing sit to stand with max assist and use of AE PRN. OT Short Term Goal 4 (Week 1): Pt will complete toilet transfer with max assist using RW for support. OT Short Term Goal 5 (Week 1): Pt will donn a pullover shirt with no more than min assist in supported sitting for two consecutive trials.  Skilled Therapeutic Interventions/Progress Updates:    1:1 Pt received in the w/c. Pt taken into bathroom via w/c. Pt able to perform stand pivot transfer with grab bar with contact guard with more than reasonable amt of time. Pt took a really long time to descend onto the toilet. Pt did required max A to perform toileting tasks. Pt transferred back to w/c stand pivot with grab bar again with contact guard with increased time. Pt requires max cues throughout task to sustain attention to task to complete task thoroughly. Pt performed sit to stand at sink to engage in washing his hands at the sink. PT able to come into standing with contact guard but did required extra time and max cues to sequence washing his hands.   In the dayroom performed functional ambulation ~25 feet with RW with contact guard with improved wider BOS. In seated position participated in table top activity pt required max cues for working memory, attention to task and following directions with max cues.  Left resting up in his w/c with wife present.  Therapy  Documentation Precautions:  Precautions Precautions: Fall Precaution Comments: dementia, slow processing/initiation/poor attention Restrictions Weight Bearing Restrictions: No Pain: No c/o pain in session   Therapy/Group: Individual Therapy  [name] [facility] 10/26/2019, 3:43 PM

## 2019-10-26 NOTE — Progress Notes (Signed)
Patient's wife wanted to get patient's financial POA notarized. Relayed their request to the charge RN and PA. Notified the patient's wife it's a matter that needs to be addressed after patient's discharge.

## 2019-10-26 NOTE — Progress Notes (Signed)
Team Conference Report to [employer] discussion was reviewed with the patient and caregiver, including goals for minimal-moderate assistance, any changes in plan of care ?SNF due to lack of support at discharge and target discharge date.  Patient and caregiver express understanding and overwhelmed and need time to discuss plan of care/discharge destination.The patient has a target discharge date of 11/11/19. Or if SNF discharge chosen, discharge will be when bed offer is made. SNF placement process to begin when medically stable and patient/wife confirm decision.  Dorien Chihuahua B 10/26/2019, 1:55 PM

## 2019-10-27 ENCOUNTER — Inpatient Hospital Stay (HOSPITAL_COMMUNITY): Payer: Medicare Other

## 2019-10-27 ENCOUNTER — Inpatient Hospital Stay (HOSPITAL_COMMUNITY): Payer: Medicare Other | Admitting: Occupational Therapy

## 2019-10-27 ENCOUNTER — Inpatient Hospital Stay (HOSPITAL_COMMUNITY): Payer: Medicare Other | Admitting: Speech Pathology

## 2019-10-27 DIAGNOSIS — F0280 Dementia in other diseases classified elsewhere without behavioral disturbance: Secondary | ICD-10-CM

## 2019-10-27 DIAGNOSIS — Z515 Encounter for palliative care: Secondary | ICD-10-CM

## 2019-10-27 LAB — CBC WITH DIFFERENTIAL/PLATELET
Abs Immature Granulocytes: 0.07 10*3/uL (ref 0.00–0.07)
Basophils Absolute: 0.1 10*3/uL (ref 0.0–0.1)
Basophils Relative: 1 %
Eosinophils Absolute: 0.1 10*3/uL (ref 0.0–0.5)
Eosinophils Relative: 2 %
HCT: 29.7 % — ABNORMAL LOW (ref 39.0–52.0)
Hemoglobin: 10 g/dL — ABNORMAL LOW (ref 13.0–17.0)
Immature Granulocytes: 1 %
Lymphocytes Relative: 19 %
Lymphs Abs: 1.3 10*3/uL (ref 0.7–4.0)
MCH: 33.6 pg (ref 26.0–34.0)
MCHC: 33.7 g/dL (ref 30.0–36.0)
MCV: 99.7 fL (ref 80.0–100.0)
Monocytes Absolute: 0.6 10*3/uL (ref 0.1–1.0)
Monocytes Relative: 8 %
Neutro Abs: 4.7 10*3/uL (ref 1.7–7.7)
Neutrophils Relative %: 69 %
Platelets: 299 10*3/uL (ref 150–400)
RBC: 2.98 MIL/uL — ABNORMAL LOW (ref 4.22–5.81)
RDW: 23.4 % — ABNORMAL HIGH (ref 11.5–15.5)
WBC: 6.8 10*3/uL (ref 4.0–10.5)
nRBC: 0 % (ref 0.0–0.2)

## 2019-10-27 NOTE — Consult Note (Signed)
Consultation Note Date: 10/27/2019   Patient Name: Eric Larsen  DOB: Jul 27, 1931  MRN: [id number]  Age / Sex: 86 y.o., male  PCP: [name], MD Referring Physician: Jamse Arn, MD  Reason for Consultation: Establishing goals of care and Psychosocial/spiritual support  HPI/Patient Profile: 86 y.o. 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   Clinical Assessment and Goals of Care:  I have reviewed medical records including EPIC notes, labs and imaging, received report from Reesa Chew, PA-C, examined the patient and met at bedside with his wife [name] to discuss diagnosis prognosis, [place], EOL wishes, disposition and options.  I introduced Palliative Medicine as specialized medical care for people living with serious illness. It focuses on providing relief from the symptoms and stress of a serious illness.   We discussed a brief life review of the patient.  He is a veteran who served active duty in [place].  He and his wife are [place].  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  He and his wife [name] met at [place] many years ago.  Both of them had young children from previous marriages.  They waited until their children were grown before they married in 2004.  [name] has two daughters [place] and [place]) and a son in [place].   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  He retired on 08/22/15 and on 08/24/15 he had a bad fall.  As far as functional and nutritional status, over the last 6 months [name] notice his memory starting to slip and he lost weight.  He has begun to sundown (have increased confusion each evening).  [name] holds his hands to soothe him when he has these symptoms.  We discussed his current illness and what it means in  the larger context of his on-going co-morbidities.  [name] laments that she has not heard from the doctors.  She has only talked with Dr. Posey Pronto once since admission.  She does not feel she has a good understanding of his prognosis or his progress.  She explains that his children ask many medical questions that are difficult for her to answer and this causes a great deal of stress.    [name] states that she wants to take him home rather than send him to a facility.  Because of their finances, hiring additional private care in the home is not an option.  Per [name], if he can walk and he is continent of bowel and bladder she can take him home.  Currently he can walk, but is not continent.  [name] expresses concern because she was told that he will discharge soon and she does not have a plan in place yet.  She is going to the [place] in the morning to attempt to enroll him there.  Hopefully they will have resources that can help.  Much of our meeting was listening and offering support.  [name] was focused on understanding what [name] was attempting to say and making sense of it.  She was having difficulty focusing on our conversation and caring for him at the same time.  She felt strongly that he understood what we were saying and needed to be a part of the discussion.  At the end of our meeting we settled on several action items - I would obtain more information from the doctors (particularly the neurologist); We would set a time to talk with [name]'s children on the telephone to more thoroughly explain his condition to his children; I would return again tomorrow morning (rather than evening when he may be sun-downing).   Primary Decision Maker:  NEXT OF KIN Wife, [name]    SUMMARY OF RECOMMENDATIONS    PMT will follow up 3/13 to talk with [name] again and likely speak with the patient's daughters via phone. Need to understand from CIR MD what cognition the patient actually has - SLP  cognition eval? Discussed with Neuro this evening.  With respect to the HSV encephalitis Neuro felt it is likely that the patient is at his new cognitive baseline.  Code Status/Advance Care Planning:  Full - needs further discussion with [facility].     Symptom Management:   Would consider Haldol or Olanzapine (low dose for evening sun downing symptoms)  Additional Recommendations (Limitations, Scope, Preferences):  Full Scope Treatment  Palliative Prophylaxis:   Delirium Protocol  Psycho-social/Spiritual:   Desire for further Chaplaincy support: Not discussed.  Prognosis:  Unable to determine. He has had a steep decline with dementia due to HSV encephalitis.  I am hopeful that he has plateaued and will not continue with such a steep decline.  Hopefully he has months to years.   Discharge Planning: To Be Determined      Primary Diagnoses: Present on Admission: . Encephalitis . 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 The following aspects are pertinent.  Past Medical History:  Diagnosis Date  . Hypothyroidism   . Osteoporosis   . Thyroid disease    Social History   Socioeconomic History  . Marital status: Married    Spouse name: Not on file  . Number of children: Not on file  . Years of education: Not on file  . Highest education level: Not on file  Occupational History  . Not on file  Tobacco Use  . Smoking status: Never Smoker  . Smokeless tobacco: Never Used  Substance and Sexual Activity  . Alcohol use: Yes    Comment: one vodka tonic every evening  . Drug use: No  . Sexual activity: Not on file  Other Topics Concern  . Not on file  Social History Narrative  . Not on file   Social Determinants of Health   Financial Resource Strain:   . Difficulty of Paying Living Expenses:   Food Insecurity:   . Worried About Charity fundraiser in  the Last Year:   . Arboriculturist in the Last Year:   Transportation Needs:   . Film/video editor (Medical):   Marland Kitchen Lack of Transportation (Non-Medical):   Physical Activity:   . Days of Exercise per Week:   . Minutes of Exercise per Session:   Stress:   . Feeling of Stress :   Social Connections:   . Frequency of Communication with Friends and Family:   .  Frequency of Social Gatherings with Friends and Family:   . Attends Religious Services:   . Active Member of Clubs or Organizations:   . Attends Archivist Meetings:   Marland Kitchen Marital Status:    Family History  Problem Relation Age of Onset  . Hereditary spherocytosis Father   . Hereditary spherocytosis Sister    Scheduled Meds: . calcium-vitamin D  1 tablet Oral BID WC  . Chlorhexidine Gluconate Cloth  6 each Topical Daily  . enoxaparin (LOVENOX) injection  40 mg Subcutaneous Q24H  . feeding supplement (PRO-STAT SUGAR FREE 64)  30 mL Oral BID  . fluticasone  1 spray Each Nare Daily  . folic acid  8,453 mcg Oral Daily  . levothyroxine  125 mcg Oral QAC breakfast  . lidocaine  1 patch Transdermal Q24H  . Melatonin  3 mg Oral QHS  . senna-docusate  1 tablet Oral QHS  . sodium chloride flush  10-40 mL Intracatheter Q12H   Continuous Infusions: . acyclovir 500 mg (10/27/19 1421)  . levETIRAcetam 500 mg (10/27/19 0847)   PRN Meds:.acetaminophen, alum & mag hydroxide-simeth, bisacodyl, diphenhydrAMINE, guaiFENesin-dextromethorphan, polyethylene glycol, prochlorperazine **OR** prochlorperazine **OR** prochlorperazine, sodium chloride flush, sodium phosphate No Known Allergies  Review of Systems Patient unable to provide.  Physical Exam  Frail elderly gentleman - in wheelchair with waist belt on to keep him in chair. Awake, Alert, unable to participate in our discussion.  Had difficulty expressing his needs.   Vital Signs: BP 121/85 (BP Location: Left Arm)   Pulse 94   Temp 97.9 F (36.6 C) (Oral)   Resp 19    Ht '5\' 4"'  (1.626 m)   Wt 48.1 kg Comment: 106  SpO2 90%   BMI 18.19 kg/m  Pain Scale: Faces POSS *See Group Information*: 1-Acceptable,Awake and alert Pain Score: 0-No pain   SpO2: SpO2: 90 % O2 Device:SpO2: 90 % O2 Flow Rate: .   IO: Intake/output summary:   Intake/Output Summary (Last 24 hours) at 10/27/2019 1549 Last data filed at 10/27/2019 1337 Gross per 24 hour  Intake 170 ml  Output -  Net 170 ml    LBM: Last BM Date: 10/26/19 Baseline Weight: Weight: 57.2 kg Most recent weight: Weight: 48.1 kg(106)     Palliative Assessment/Data: 40%     Time In: 4:00  Time Out: 5:00 Time Total: 60 min Visit consisted of counseling and education dealing with the complex and emotionally intense issues surrounding the need for palliative care and symptom management in the setting of serious and potentially life-threatening illness. Greater than 50%  of this time was spent counseling and coordinating care related to the above assessment and plan.  Signed by: Florentina Jenny, PA-C Palliative Medicine  Please contact Palliative Medicine Team phone at [phone] for questions and concerns.  For individual provider: See Shea Evans

## 2019-10-27 NOTE — Progress Notes (Addendum)
Pharmacy Antimicrobial Note  Eric Larsen is a 86 y.o. male admitted on 10/18/2019 with altered mental status.  Pharmacy has been consulted for Acyclovir dosing for HSV 1 encephalitis.   Afebrile, WBC wnl. Last BMP 3/8, Renal function was stable. No BMP today, will order for tomorrow. Plan to treat for 3 weeks through 3/21. PO fluid intake not documented completely, eating 0-35% of meals.   Plan: Continue Acyclovir 500mg  IV q8h, last day 11/05/19 Monitor Q Wed/Sat BMP for renal fx while on acyclovir -spoke with MD about lab time change  If renal function worsens, add NS pre-hydration if patient can tolerate it (last ECHO 2018 EF 60% with HFpEF)  Height: 5\' 4"  (162.6 cm) Weight: 106 lb (48.1 kg)(106) IBW/kg (Calculated) : 59.2  Temp (24hrs), Avg:98.2 F (36.8 C), Min:98 F (36.7 C), Max:98.7 F (37.1 C)  Recent Labs  Lab 10/23/19 0458 10/27/19 0405  WBC 8.4 6.8  CREATININE 0.98  --     Estimated Creatinine Clearance: 36.8 mL/min (by C-G formula based on SCr of 0.98 mg/dL).    No Known Allergies  Antimicrobials   Acyclovir 2/28>>(3/21) Cefepime 2/27 Vanc 2/27 MTZ 2/27   Microbiology 10/16/19 CSF + HSV1    Benetta Spar, PharmD, BCPS, [facility] Clinical Pharmacist  Please check AMION for all [place] phone numbers After 10:00 PM, call [place] [phone]

## 2019-10-27 NOTE — Progress Notes (Signed)
[place] PHYSICAL MEDICINE & REHABILITATION PROGRESS NOTE  Subjective/Complaints: Patient seen sitting up in bed this AM, eating breakfast.  He slept fairly overnight per limited sleep chart.  After discussion with wife, palliative consult ordered.   ROS: Unable to assess due to cognition.   Objective: Vital Signs: Blood pressure 129/87, pulse 73, temperature 98.7 F (37.1 C), temperature source Axillary, resp. rate 18, height 5\' 4"  (1.626 m), weight 48.1 kg, SpO2 99 %. No results found. Recent Labs    10/27/19 0405  WBC 6.8  HGB 10.0*  HCT 29.7*  PLT 299   No results for input(s): NA, K, CL, CO2, GLUCOSE, BUN, CREATININE, CALCIUM in the last 72 hours.  Physical Exam: BP 129/87 (BP Location: Left Arm)   Pulse 73   Temp 98.7 F (37.1 C) (Axillary)   Resp 18   Ht 5\' 4"  (1.626 m)   Wt 48.1 kg Comment: 106  SpO2 99%   BMI 18.19 kg/m  Constitutional: No distress . Vital signs reviewed. Frail.  HENT: Normocephalic.  Atraumatic. Eyes: EOMI. No discharge. Cardiovascular: No JVD. Respiratory: Normal effort.  No stridor. GI: Non-distended. Skin: Warm and dry.  Intact. Psych: Unable to assess due to mentation Musc: No edema in extremities.  No tenderness in extremities. Neurological: Alert Motor: Appears to be 4+/5 throughout, unchanged  Assessment/Plan: 1. 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  Physiatrist is providing close team supervision and 24 hour management of active medical problems listed below.  Physiatrist and rehab team continue to assess barriers to discharge/monitor patient progress toward functional and medical goals  Care Tool:  Bathing    Body parts bathed by patient: Right arm, Left arm, Chest, Abdomen, Face, Left upper leg, Right upper leg   Body parts bathed by helper: Left lower leg, Right lower leg, Front perineal area,  Buttocks     Bathing assist Assist Level: 2 Helpers     Upper Body Dressing/Undressing Upper body dressing   What is the patient wearing?: Pull over shirt    Upper body assist Assist Level: Minimal Assistance - Patient > 75%    Lower Body Dressing/Undressing Lower body dressing      What is the patient wearing?: Incontinence brief, Pants     Lower body assist Assist for lower body dressing: Maximal Assistance - Patient 25 - 49%     Toileting Toileting    Toileting assist Assist for toileting: Dependent - Patient 0%     Transfers Chair/bed transfer  Transfers assist     Chair/bed transfer assist level: Contact Guard/Touching assist     Locomotion Ambulation   Ambulation assist      Assist level: 2 helpers Assistive device: Walker-rolling Max distance: 51ft   Walk 10 feet activity   Assist     Assist level: 2 helpers Assistive device: Walker-rolling   Walk 50 feet activity   Assist Walk 50 feet with 2 turns activity did not occur: Safety/medical concerns(fatigued)         Walk 150 feet activity   Assist Walk 150 feet activity did not occur: Safety/medical concerns(fatigued)         Walk 10 feet on uneven surface  activity   Assist Walk 10 feet on uneven surfaces activity did not occur: Safety/medical concerns(fatigued)         Wheelchair     Assist Will patient use wheelchair at discharge?: Yes Type of Wheelchair:  Manual    Wheelchair assist level: Dependent - Patient 0%      Wheelchair 50 feet with 2 turns activity    Assist        Assist Level: Dependent - Patient 0%   Wheelchair 150 feet activity     Assist     Assist Level: Dependent - Patient 0%      Medical Problem List and Plan: 1.  Cognitive deficits with unintelligible language, weakness, secondary to herpes encephalitis.  Continue CIR             Cont IV acyclovir until 3/21 2.  Antithrombotics: -DVT/anticoagulation:   Pharmaceutical: Lovenox             -antiplatelet therapy: NA 3. Pain Management: PRN medications. Lidocaine patch ordered for left shoulder. Pain better controlled.  4. Mood: Team support             -antipsychotic agents: NA 5. Neuropsych: This patient is not capable of making decisions on his own behalf.  Telesitter for safety  See #14 6. Skin/Wound Care: Routine pressure relief measures.  7. Fluids/Electrolytes/Nutrition: Monitor I/O.   BMP within acceptable range except for glucose on 3/8, labs ordered for Monday 8. Hypothyroid: Continue Synthroid 187mcg. 9. Epileptogenic potential: Conitnue Keppra 500 mg bid for seizure  No seizures from admission-3/12 10. Hypokalemia: Resolved 11. Macrocytic anemia:   Hemoglobin 10.0 on 3/12, labs ordered for Monday  Vitamin B12 within normal limits  Folate WNL  Hemoccult remains pending, discussed   Continue to monitor 12.  Hypoalbuminemia  Supplement initiated on 3/4 13.  Severe Oral Dysphagia and mild to moderate pharyngeal dysphagia:   D1 thins, advance diet as tolerated 14. Lethargy/decreased attention: Medications reviewed and he is not taking any overly sedating medications.   Methylphenidate DC'd, patient alert with deficits related to encephalitis  Sleep chart ordered, consider meds accordingly 15. Severe kyphosis: position with pillows when sleeping and sitting in chair, placed nursing order.  16.  Labile blood pressure  Controlled on 3/12  LOS: 9 days A FACE TO FACE EVALUATION WAS PERFORMED  Eric Larsen Eric Larsen 10/27/2019, 9:13 AM

## 2019-10-27 NOTE — Progress Notes (Signed)
Speech Language Pathology Weekly Progress and Session Note  Patient Details  Name: Eric Larsen MRN: [id number] Date of Birth: 04/16/1931  Beginning of progress report period: October 19, 2019 End of progress report period: October 27, 2019  Today's Date: 10/27/2019 SLP Individual Time: 1215-1300 SLP Individual Time Calculation (min): 45 min  Short Term Goals: Week 1: SLP Short Term Goal 1 (Week 1): Pt will sustain attention to basic task for 3 minutes with Max A cues. SLP Short Term Goal 1 - Progress (Week 1): Not met SLP Short Term Goal 2 (Week 1): Pt will initiate basic familiar tasks in 2 out of 5 opportunities with Max A cues. SLP Short Term Goal 2 - Progress (Week 1): Not met SLP Short Term Goal 3 (Week 1): Pt will follow 1 step directions in 2 out of 5 opportunities with Max A cues. SLP Short Term Goal 3 - Progress (Week 1): Not met SLP Short Term Goal 4 (Week 1): Pt will participate in basic problem solving tasks targeting basic ADLs with Max A cues. SLP Short Term Goal 4 - Progress (Week 1): Not met SLP Short Term Goal 5 (Week 1): Pt will consume least restrictive diet with minimal s/s of dsyphagia and aspiration. SLP Short Term Goal 5 - Progress (Week 1): Progressing toward goal    New Short Term Goals: Week 2: SLP Short Term Goal 1 (Week 2): Pt will consume dysphagia 2 diet with Mod A cues for use of compensatory swallow strategies to clear oral residue. SLP Short Term Goal 2 (Week 2): Pt will focus attention to basic task for ~ 1 minute with Max A multimodal cues for redirection. SLP Short Term Goal 3 (Week 2): Pt will perform basic counting tasks to 10 with Max A cues. SLP Short Term Goal 4 (Week 2): Pt will read information on calendar to state date with Max A cues.  Weekly Progress Updates: Pt has made minimal progress this reporting period and as a result he has not meet many of his STGs. Pt is however consuming dysphagia 2 diet with Mod A cues for complete oral clearing  and attention to bolus. Pt continues to have severe deficits in focused attention, memory, carryover of information, task initiation, problem solving, awareness and orientation. More specifically, pt's deficits in awareness and attention prevent him from consuming > 25% per meal. Pt continues to require skilled ST to target these deficits.   Education with pt's wife has been ongoing.    Intensity: Minumum of 1-2 x/day, 30 to 90 minutes Frequency: 3 to 5 out of 7 days Duration/Length of Stay: TBD Treatment/Interventions: Cognitive remediation/compensation;Speech/Language facilitation;Functional tasks;Patient/family education;Dysphagia/aspiration precaution training   Daily Session  Skilled Therapeutic Interventions:  Skilled treatment focused on targeted dysphagia and cognition goals. SLP faciltiated session by providing education to pt's wife on current plan to continue allowing dysphagia 2 and that pt requires absolute quiet when eating to reduce distractions. SLP set-up meal for pt and provided simple direction cues to promotoe attention to task of self-feeding. For example, pt was perseverative in taking off his safety belt. He is briefly responsive to direct cues, "[name] don't touch the belt, eat." As a result, he was able to feed himself ~ 75% of meal. Pt required Mod A verbal cues for attention to oral residue but he was able to clear residue with extended time. Pt didn't present with any overt s/s of aspiration. Education also provided to pt's wife on how to most effectively communicate with pt (i.e.,  decreasing verbal interaction appears the most helpful).      General    Pain    Therapy/Group: Individual Therapy  Eric Larsen 10/27/2019, 4:12 PM

## 2019-10-27 NOTE — Progress Notes (Addendum)
Provider called about a hemoccult lab order. Last BM was at 0200. RN and day NT are aware we need to collect one.

## 2019-10-27 NOTE — Plan of Care (Signed)
  Problem: Consults Goal: RH GENERAL PATIENT EDUCATION Description: See Patient Education module for education specifics. Outcome: Progressing   Problem: RH BOWEL ELIMINATION Goal: RH STG MANAGE BOWEL WITH ASSISTANCE Description: STG Manage Bowel with Assistance. Mod  Outcome: Progressing   Problem: RH BLADDER ELIMINATION Goal: RH STG MANAGE BLADDER WITH ASSISTANCE Description: STG Manage Bladder With Assistance. Mod Outcome: Progressing   Problem: RH SKIN INTEGRITY Goal: RH STG MAINTAIN SKIN INTEGRITY WITH ASSISTANCE Description: STG Maintain Skin Integrity With Assistance. Mod Outcome: Progressing   Problem: RH SAFETY Goal: RH STG ADHERE TO SAFETY PRECAUTIONS W/ASSISTANCE/DEVICE Description: STG Adhere to Safety Precautions With Assistance/Device. Mod Outcome: Progressing   Problem: RH PAIN MANAGEMENT Goal: RH STG PAIN MANAGED AT OR BELOW PT'S PAIN GOAL Description: Less than 3 Outcome: Progressing

## 2019-10-27 NOTE — Progress Notes (Signed)
Physical Therapy Session Note  Patient Details  Name: Eric Larsen MRN: [id number] Date of Birth: Apr 08, 1931  Today's Date: 10/27/2019 PT Individual Time: 3354-5625 and 1345-1425 PT Individual Time Calculation (min): 56 min and 40 min  Short Term Goals: Week 1:  PT Short Term Goal 1 (Week 1): Will be able to perform bed mobility with ModAx1 PT Short Term Goal 1 - Progress (Week 1): Progressing toward goal PT Short Term Goal 2 (Week 1): Will be able to stand for 5-7 minutes with BUE support and ModA PT Short Term Goal 2 - Progress (Week 1): Met PT Short Term Goal 3 (Week 1): Will be able to transfer with LRAD and ModAx1 PT Short Term Goal 3 - Progress (Week 1): Progressing toward goal Week 2:  PT Short Term Goal 1 (Week 2): Will be able to perform bed mobility mod A PT Short Term Goal 2 (Week 2): Pt will perform bed<>chair transfer with LRAD mod A PT Short Term Goal 3 (Week 2): Pt will transfer sit<>stand with LRAD min A  Skilled Therapeutic Interventions/Progress Updates:   Treatment Session 1: 6389-3734 56 min Received pt supine in bed asleep, pt agreeable to therapy once woken, and denied any pain during session. Session focused on functional mobility/transfers, dressing, simulated car transfer, LE strength, dynamic standing balance/coordination, and improved activity tolerance. Pt rolled to L and R with max A and use of bedrails and required max A to don pants. Pt transferred supine<>sitting EOB with max A and verbal cues for sequencing/motor planning. While sitting EOB, pt donned pull over shirt with max A. Pt transferred stand<>pivot bed<>TIS WC mod A without AD with 2nd person for safety/equipment. Pt transported to ortho gym in [place] total assist. Pt performed simulated car transfer without AD mod A with 2nd person for safety with increased time for technique, sequencing, and motor planning. Pt required max verbal cues for technique and turning and max A to bring LEs into car.  Worked on blocked practice sit<>stand transfers with RW with mod/max A x 3 trials. Pt with decreased motor planning requiring max verbal cues for hand placement when standing, anterior weight shift, and upright posture. Pt with continued strong posterior lean upon standing and demonstrating increased difficulty following commands today. Pt transported back to room in TIS WC total assist. Concluded session with pt semi-reclined in TIS WC, needs within reach, and seatbelt alarm on.   Treatment Session 2: 1345-1425 40 min Received pt sitting in TIS WC and wife present at bedside, pt agreeable to therapy, and denied any pain during session. Session focused on functional mobility/transfers, stair navigation, ambulation, LE strength, dynamic standing balance/coordination, and improved activity tolerance. Pt transported to gym in [facility] total assist for time management. Pt navigated 4 steps with 2 rails mod A ascending and descending with a step to pattern. Pt reqired max verbal cues and increased time to navigate stairs. Pt required cues for step sequence, hand placement on rails, turning technique, and motor planning. Pt ambulated 16f with RW min A +2 for WC follow/equipment. Pt required max verbal cues for RW safety, stepping technique, and upright posture. [facility]. Concluded session with [facility], seatbelt alarm on, and RN present attending to care.   Therapy Documentation Precautions:  Precautions Precautions: Fall Precaution Comments: dementia, slow processing/initiation/poor attention Restrictions Weight Bearing Restrictions: No  Therapy/Group: Individual Therapy AValley FallsPT, DPT  10/27/2019,  7:39 AM

## 2019-10-27 NOTE — Progress Notes (Signed)
Occupational Therapy Weekly Progress Note  Patient Details  Name: Eric Larsen MRN: [id number] Date of Birth: 07/18/1931  Beginning of progress report period: October 19, 2019 End of progress report period: October 27, 2019  Today's Date: 10/27/2019 OT Individual Time: 1100-1200 OT Individual Time Calculation (min): 60 min    Patient has met 2 of 5 short term goals.  Pt is making slow progress towards goals.  Pt can complete sit > stand with as little as min assist however more consistently requires mod assist and up to +2 for sit > stand especially during dynamic standing balance for hygiene and clothing management.  Pt is able to complete stand pivot transfers with mod-max assist, as little as min assist with RW on one instance but more consistently Mod assist +2 for safety and management of RW and IV.  Pt continues to demonstrate decreased initiation and sequencing, motor planning, and fatigue which limits progress towards goals.    Patient continues to demonstrate the following deficits: muscle weakness, decreased midline orientation, decreased initiation, decreased attention, decreased awareness, decreased problem solving, decreased safety awareness, decreased memory and delayed processing and decreased sitting balance, decreased standing balance, decreased postural control and decreased balance strategies and therefore will continue to benefit from skilled OT intervention to enhance overall performance with BADL and Reduce care partner burden.  Patient progressing toward long term goals..  Continue plan of care.  OT Short Term Goals Week 1:  OT Short Term Goal 1 (Week 1): Pt will maintain static sitting balance with supervision for at least 3 mins in perparation for selfcare tasks. OT Short Term Goal 1 - Progress (Week 1): Progressing toward goal OT Short Term Goal 2 (Week 1): Pt will complete UB bathing with no more than min instructional cueing for initiation and sequencing. OT Short  Term Goal 2 - Progress (Week 1): Progressing toward goal OT Short Term Goal 3 (Week 1): Pt will complete LB bathing sit to stand with max assist and use of AE PRN. OT Short Term Goal 3 - Progress (Week 1): Met OT Short Term Goal 4 (Week 1): Pt will complete toilet transfer with max assist using RW for support. OT Short Term Goal 4 - Progress (Week 1): Met OT Short Term Goal 5 (Week 1): Pt will donn a pullover shirt with no more than min assist in supported sitting for two consecutive trials. OT Short Term Goal 5 - Progress (Week 1): Progressing toward goal Week 2:  OT Short Term Goal 1 (Week 2): Pt will maintain static sitting balance with supervision for at least 3 mins in perparation for selfcare tasks. OT Short Term Goal 2 (Week 2): Pt will complete UB bathing with no more than min instructional cueing for initiation and sequencing. OT Short Term Goal 3 (Week 2): Pt will donn a pullover shirt with no more than min assist in supported sitting for two consecutive trials. OT Short Term Goal 4 (Week 2): Pt will complete toilet transfers with min assist for two consecutive trials OT Short Term Goal 5 (Week 2): Pt will complete LB dressing with mod assist at sit > stand level  Skilled Therapeutic Interventions/Progress Updates:    Treatment session with focus on initiation and sequencing during self-care tasks and functional mobility.  Pt received upright in w/c agreeable to therapy session.  [name] stated pt had a great day yesterday but did not sleep well overnight.  Engaged in oral care sitting at sink with increased time and cues for initiation,   but once he did place toothbrush in mouth was able to sequence oral care with improved initiation with rinse/spit.  Pt able to identify comb but unable to utilize comb, therefore total assist for combing hair.  Engaged in sit > stand at high low table with assist for hand placement to increase sequencing and independence with sit > stand.  Pt stood from w/c  with Mod assist faded to min during standing task.  Engaged in folding towels in standing with pt requiring increased time with aspects of folding but able to complete with only min cues.  Ambulated 15' with B hand held assist (+2) with good sequencing of stepping pattern, however increased forward flexion.  Attempted ambulating with RW, pt with increased upright posture, but requiring increased assistance and multimodal cues for sequencing and forward momentum.  Pt remained tilted back in TIS w/c for increased positioning/tolerance with seat belt alarm on and wife in room.  Therapy Documentation Precautions:  Precautions Precautions: Fall Precaution Comments: dementia, slow processing/initiation/poor attention Restrictions Weight Bearing Restrictions: No Pain:   Pt with no c/o pain  Therapy/Group: Individual Therapy  ,  10/27/2019, 9:04 AM   

## 2019-10-28 ENCOUNTER — Inpatient Hospital Stay (HOSPITAL_COMMUNITY): Payer: Medicare Other | Admitting: Occupational Therapy

## 2019-10-28 LAB — BASIC METABOLIC PANEL
Anion gap: 8 (ref 5–15)
BUN: 21 mg/dL (ref 8–23)
CO2: 30 mmol/L (ref 22–32)
Calcium: 9.6 mg/dL (ref 8.9–10.3)
Chloride: 96 mmol/L — ABNORMAL LOW (ref 98–111)
Creatinine, Ser: 0.88 mg/dL (ref 0.61–1.24)
GFR calc Af Amer: >60 mL/min (ref >60)
GFR calc non Af Amer: >60 mL/min (ref >60)
Glucose, Bld: 117 mg/dL — ABNORMAL HIGH (ref 70–99)
Potassium: 3.2 mmol/L — ABNORMAL LOW (ref 3.5–5.1)
Sodium: 134 mmol/L — ABNORMAL LOW (ref 135–145)

## 2019-10-28 LAB — CBC
HCT: 29.1 % — ABNORMAL LOW (ref 39.0–52.0)
Hemoglobin: 10.1 g/dL — ABNORMAL LOW (ref 13.0–17.0)
MCH: 34.4 pg — ABNORMAL HIGH (ref 26.0–34.0)
MCHC: 34.7 g/dL (ref 30.0–36.0)
MCV: 99 fL (ref 80.0–100.0)
Platelets: 297 10*3/uL (ref 150–400)
RBC: 2.94 MIL/uL — ABNORMAL LOW (ref 4.22–5.81)
RDW: 24 % — ABNORMAL HIGH (ref 11.5–15.5)
WBC: 6.9 10*3/uL (ref 4.0–10.5)
nRBC: 0 % (ref 0.0–0.2)

## 2019-10-28 MED ORDER — POTASSIUM CHLORIDE CRYS ER 20 MEQ PO TBCR
40.0000 meq | EXTENDED_RELEASE_TABLET | Freq: Two times a day (BID) | ORAL | Status: AC
  Administered 2019-10-28 – 2019-10-29 (×2): 40 meq via ORAL
  Filled 2019-10-28 (×2): qty 2

## 2019-10-28 MED ORDER — PREGABALIN 25 MG PO CAPS
25.0000 mg | ORAL_CAPSULE | Freq: Two times a day (BID) | ORAL | Status: DC
  Administered 2019-10-28 – 2019-10-29 (×3): 25 mg via ORAL
  Filled 2019-10-28 (×3): qty 1

## 2019-10-28 MED ORDER — OLANZAPINE 5 MG PO TBDP: 2.5000 mg | ORAL_TABLET | Freq: Every day | ORAL | Status: DC | PRN

## 2019-10-28 MED ORDER — OLANZAPINE 5 MG PO TBDP: 2.5000 mg | ORAL_TABLET | Freq: Every day | ORAL | Status: DC

## 2019-10-28 MED ORDER — TRAZODONE HCL 50 MG PO TABS
25.0000 mg | ORAL_TABLET | Freq: Every day | ORAL | Status: DC
  Administered 2019-10-28: 25 mg via ORAL
  Filled 2019-10-28: qty 1

## 2019-10-28 MED ORDER — OLANZAPINE 5 MG PO TBDP
2.5000 mg | ORAL_TABLET | Freq: Every day | ORAL | Status: DC
  Administered 2019-10-28: 2.5 mg via ORAL
  Filled 2019-10-28 (×3): qty 0.5

## 2019-10-28 NOTE — Progress Notes (Signed)
[facility] PHYSICAL MEDICINE & REHABILITATION PROGRESS NOTE  Subjective/Complaints:   Pt trying to express himself- had severe inability to make himself understood but did perseverate that didn't sleep well- his sleep chart agreed with this- will try trazodone low dose-  Also appeared like was saying legs hurt, but not clear- when gave him options, sounded like he was saying that had more nerve pain, not aching pain, gave him options and picked nerve pain Sx's 4x.     ROS: Unable to assess due to cognition. Word salad unless nodding head yes and no. And even that's not for sure.  Objective: Vital Signs: Blood pressure 140/70, pulse 100, temperature 97.7 F (36.5 C), temperature source Oral, resp. rate 18, height 5\' 4"  (1.626 m), weight 48.1 kg, SpO2 100 %. No results found. Recent Labs    10/27/19 0405 10/28/19 0125  WBC 6.8 6.9  HGB 10.0* 10.1*  HCT 29.7* 29.1*  PLT 299 297   Recent Labs    10/28/19 0125  NA 134*  K 3.2*  CL 96*  CO2 30  GLUCOSE 117*  BUN 21  CREATININE 0.88  CALCIUM 9.6    Physical Exam: BP 140/70 (BP Location: Left Arm)   Pulse 100   Temp 97.7 F (36.5 C) (Oral)   Resp 18   Ht 5\' 4"  (1.626 m)   Wt 48.1 kg Comment: 106  SpO2 100%   BMI 18.19 kg/m  Constitutional: No distress . Frail appearing elderly male sitting in recline in w/c, NAD  HENT: conjugate gaze; no tearing Cardiovascular: no JVD. Respiratory: CTA B/L- good air movement GI: soft, NT, ND, (+) hypoactive BS Skin: Warm and dry.  Intact. Psych: Unable to assess due to mentation- pt trying to communicate, but word salad.  Musc: No edema in extremities.  No tenderness in extremities. Neurological: Alert but not oriented Motor: Appears to be 4+/5 throughout, unchanged  Assessment/Plan: 1. 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  Physiatrist is providing close team  supervision and 24 hour management of active medical problems listed below.  Physiatrist and rehab team continue to assess barriers to discharge/monitor patient progress toward functional and medical goals  Care Tool:  Bathing    Body parts bathed by patient: Right arm, Left arm, Chest, Abdomen, Face, Left upper leg, Right upper leg   Body parts bathed by helper: Left lower leg, Right lower leg, Front perineal area, Buttocks     Bathing assist Assist Level: 2 Helpers     Upper Body Dressing/Undressing Upper body dressing   What is the patient wearing?: Pull over shirt    Upper body assist Assist Level: Maximal Assistance - Patient 25 - 49%    Lower Body Dressing/Undressing Lower body dressing      What is the patient wearing?: Incontinence brief, Pants     Lower body assist Assist for lower body dressing: Maximal Assistance - Patient 25 - 49%     Toileting Toileting    Toileting assist Assist for toileting: Dependent - Patient 0%     Transfers Chair/bed transfer  Transfers assist     Chair/bed transfer assist level: Minimal Assistance - Patient > 75%     Locomotion Ambulation   Ambulation assist      Assist level: 2 helpers Assistive device: Walker-rolling Max distance: 82ft   Walk 10 feet activity   Assist     Assist level: 2 helpers Assistive  device: Walker-rolling   Walk 50 feet activity   Assist Walk 50 feet with 2 turns activity did not occur: Safety/medical concerns(fatigued)         Walk 150 feet activity   Assist Walk 150 feet activity did not occur: Safety/medical concerns(fatigued)         Walk 10 feet on uneven surface  activity   Assist Walk 10 feet on uneven surfaces activity did not occur: Safety/medical concerns(fatigued)         Wheelchair     Assist Will patient use wheelchair at discharge?: Yes Type of Wheelchair: Manual    Wheelchair assist level: Dependent - Patient 0%      Wheelchair 50  feet with 2 turns activity    Assist        Assist Level: Dependent - Patient 0%   Wheelchair 150 feet activity     Assist     Assist Level: Dependent - Patient 0%      Medical Problem List and Plan: 1.  Cognitive deficits with unintelligible language, weakness, secondary to herpes encephalitis.  Continue CIR             Cont IV acyclovir until 3/21 2.  Antithrombotics: -DVT/anticoagulation:  Pharmaceutical: Lovenox             -antiplatelet therapy: NA 3. Pain Management: PRN medications. Lidocaine patch ordered for left shoulder. Pain better controlled.   3/13- pt appeared to keep pointing at legs like they hurt- will try Lyrica 25 mg BID- if has side effects, will stop-  4. Mood: Team support             -antipsychotic agents: NA 5. Neuropsych: This patient is not capable of making decisions on his own behalf.  Telesitter for safety  See #14 6. Skin/Wound Care: Routine pressure relief measures.  7. Fluids/Electrolytes/Nutrition: Monitor I/O.   BMP within acceptable range except for glucose on 3/8, labs ordered for Monday 8. Hypothyroid: Continue Synthroid 119mcg. 9. Epileptogenic potential: Conitnue Keppra 500 mg bid for seizure  No seizures from admission-3/12 10. Hypokalemia: Resolved 11. Macrocytic anemia:   Hemoglobin 10.0 on 3/12, labs ordered for Monday  Vitamin B12 within normal limits  Folate WNL  Hemoccult remains pending, discussed   Continue to monitor 12.  Hypoalbuminemia  Supplement initiated on 3/4 13.  Severe Oral Dysphagia and mild to moderate pharyngeal dysphagia:   D1 thins, advance diet as tolerated 14. Lethargy/decreased attention: Medications reviewed and he is not taking any overly sedating medications.   Methylphenidate DC'd, patient alert with deficits related to encephalitis  Sleep chart ordered, consider meds accordingly 15. Severe kyphosis: position with pillows when sleeping and sitting in chair, placed nursing order.  16.   Labile blood pressure  Controlled on 3/12 17/ Insomnia  3/13- sleep chart showed didn't sleep well last night- will try trazodone 25 mg QHS at most-  18. Hypokalemia  3/13- K+ 3.2- will replete 40 mEq x2 and recheck Monday.   LOS: 10 days A FACE TO FACE EVALUATION WAS PERFORMED  Derrich Gaby 10/28/2019, 3:36 PM

## 2019-10-28 NOTE — Progress Notes (Addendum)
Palliative follow up.  Sat at bedside with patient and his wife Eric Larsen.  Eric Larsen describes a rough evening of sundowning the previous evening.  We talked about utilizing low dose Olanzapine for appetite, sleep and symptoms of sundowning.  She is agreeable.  Eric Larsen began rubbing his head.  He nodded yes when asked if he had a headache.  His wife raises concerns - he has frequent headaches and this is new for him.  She asks if it could be more brain swelling?  How do we know if the acyclovir is working?  Is there blood work that can be done.  I explained that the acyclovir is in place for two weeks.  When its completed if he is symptomatic (worsening headaches, confusion,etc) additional work up can be undertaken.  We discussed the need for outpatient neurology follow up.  Eric Larsen would greatly appreciate a meeting with Eric Larsen's MD in [place].  Eric Larsen feels strongly that Eric Larsen understands everything that is being discussed.  She listens intently as he tries with frustration to express his thoughts.  What a difficult situation.  I ask Eric Larsen if we can involve Eric Larsen's children in a conversation about his health.  Perhaps I can answer any questions they may have?  Eric Larsen defer to [employer] you want to involve the children in your health situation?"  Eric Larsen replies "No".   I feel this is unfortunate and the couple will need all of the support and understanding they can get.  Further Eric Larsen expresses that she has difficulty answering the daughters questions to their satisfaction.  I would like to attempt to assist with that situation.  I mention [facility] as a potential resource to help with Eric Larsen after discharge.  She will investigate further.   Recommendations:  1.  Will the attending physician schedule a time with Eric Larsen to answer her medical questions? 2.  Recommend a follow up Neurology consult while he is at [facility] (This may answer several of [name]'s questions) as well as outpatient  Neuro follow up. 3.  Will trial Olanzapine 2.5 mg at 5 pm in the evening for appetite, sleep and agitation. 4.  Have not discussed code status yet.  I do not believe Eric Larsen is prepared for this yet.  I committed to check in again tomorrow or Monday.  Eric Jenny, PA-C Palliative Medicine Office:  [phone]  Greater than 50%  of this time was spent counseling and coordinating care related to the above assessment and plan. 30 min.

## 2019-10-28 NOTE — Progress Notes (Signed)
Occupational Therapy Session Note  Patient Details  Name: Eric Larsen MRN: [id number] Date of Birth: 08/04/1931  Today's Date: 10/28/2019 OT Individual Time: [id number] OT Individual Time Calculation (min): 60 min    Short Term Goals: Week 2:  OT Short Term Goal 1 (Week 2): Pt will maintain static sitting balance with supervision for at least 3 mins in perparation for selfcare tasks. OT Short Term Goal 2 (Week 2): Pt will complete UB bathing with no more than min instructional cueing for initiation and sequencing. OT Short Term Goal 3 (Week 2): Pt will donn a pullover shirt with no more than min assist in supported sitting for two consecutive trials. OT Short Term Goal 4 (Week 2): Pt will complete toilet transfers with min assist for two consecutive trials OT Short Term Goal 5 (Week 2): Pt will complete LB dressing with mod assist at sit > stand level  Skilled Therapeutic Interventions/Progress Updates:    Patient supine in bed, alert and cooperative, expressive impairment noted.  Supine to sit with min/mod A.  Facilitation needed for flexed posture and to maintain foot position during sit to stand as he tends to have extensor pattern with initiation of standing.  SPT with mod A and increased time for facilitated weight shift.  Stand to sit with focus on flexion.  Completed oral care with min a.  Donned [place] shirt with mod A.  Donned pants with mod A.  Dependent for clothing management in stance.   SPT to mat table again focusing on weight shift, flexion, limiting posterior lean.  He is able to maintain unsupported sitting with CS - significant kyphotic posture.   Completed trunk mobility activities.  Donning socks and shoes with cross leg  & promote maintaining hip and knee position with moderate input.  Returned to w/c with mod A.  He remained seated in TIS w/c in reclined position, seat belt alarm set and call bell in hand.    Therapy Documentation Precautions:  Precautions Precautions:  Fall Precaution Comments: dementia, slow processing/initiation/poor attention Restrictions Weight Bearing Restrictions: No General:   Vital Signs: Therapy Vitals Temp: 98 F (36.7 C) Pulse Rate: 73 Resp: 18 BP: 135/83 Patient Position (if appropriate): Lying Oxygen Therapy SpO2: 95 % O2 Device: Room Air   Therapy/Group: Individual Therapy  Carlos Levering 10/28/2019, 7:36 AM

## 2019-10-29 ENCOUNTER — Inpatient Hospital Stay (HOSPITAL_COMMUNITY): Payer: Medicare Other | Admitting: Speech Pathology

## 2019-10-29 NOTE — Progress Notes (Signed)
Patients wife, at the beginning of the shift, came to the off going nurse & stated that the zyprexa wasn't working because the patient was trying to remove his brief. Both nurses went into the room & adjusted his brief. Patient was a little agitated & was not following commands. His speech was nonsensical. He was made comfortable. The telesitter is in place. A little while later,,the telesitter called & stated that the patient was pulling at his IV. Patient was noted with the IV tubing around his arm & in his hand. It had been bandaged to keep him from getting to it but the bandage was compromised. It was removed & the tubing was positioned away from his hands. Patient was discouraged multiple times about the tubing, but he was not understanding. He did calm down & medication was given as ordered. No c/o pain or discomfort. His wife also informed nurses of an abrasion to the left lateral leg below the knee. The area was cleansed & a foam dressing was applied. At this time the patient is asleep & has been asleep since approximately 2200. No acute distress noted. Will continue to monitor

## 2019-10-29 NOTE — Progress Notes (Signed)
Eric Larsen PHYSICAL MEDICINE & REHABILITATION PROGRESS NOTE  Subjective/Complaints:   Pt slept better after 10-111 pm last night, however very sedated this AM.  Tried Trazodone 25 mg QHS last night- sounds like having hangover affect this AM- very sedated/lethargic (could also be lyrica 25 mg BID).  Per SLP has been sedated, difficult to arouse this AM- and was hard to give AM meds.    ROS: sedated, cannot due to cognition   Objective: Vital Signs: Blood pressure 116/82, pulse 74, temperature 98 F (36.7 C), resp. rate 18, height 5\' 4"  (1.626 m), weight 50.8 kg, SpO2 97 %. No results found. Recent Labs    10/27/19 0405 10/28/19 0125  WBC 6.8 6.9  HGB 10.0* 10.1*  HCT 29.7* 29.1*  PLT 299 297   Recent Labs    10/28/19 0125  NA 134*  K 3.2*  CL 96*  CO2 30  GLUCOSE 117*  BUN 21  CREATININE 0.88  CALCIUM 9.6    Physical Exam: BP 116/82 (BP Location: Left Arm)   Pulse 74   Temp 98 F (36.7 C)   Resp 18   Ht 5\' 4"  (1.626 m)   Wt 50.8 kg   SpO2 97%   BMI 19.22 kg/m  Constitutional: frail appearing male, sedated, lethargic- not speaking this AM, opened eyes x1 2nd time went in, but otherwise, very sleepy, NAD HENT: kept eyes closed most of time Cardiovascular:no JVD; quiet heard sounds Respiratory: CTA B/L- a little coarse at bases, but good air movement GI: soft, NT, ND, (+) hypoactive BS Skin: Warm and dry.  Intact. Psych: sedated Musc: No edema in extremities.  No tenderness in extremities. Neurological:sedated; lethargic- hangiver from trazodone? Motor: Appears to be 4+/5 throughout, unchanged  Assessment/Plan: 1. 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  Physiatrist is providing close team supervision and 24 hour management of active medical problems listed below.  Physiatrist and rehab team continue to assess barriers to  discharge/monitor patient progress toward functional and medical goals  Care Tool:  Bathing    Body parts bathed by patient: Right arm, Left arm, Chest, Abdomen, Face, Left upper leg, Right upper leg   Body parts bathed by helper: Left lower leg, Right lower leg, Front perineal area, Buttocks     Bathing assist Assist Level: 2 Helpers     Upper Body Dressing/Undressing Upper body dressing   What is the patient wearing?: Pull over shirt    Upper body assist Assist Level: Maximal Assistance - Patient 25 - 49%    Lower Body Dressing/Undressing Lower body dressing      What is the patient wearing?: Incontinence brief, Pants     Lower body assist Assist for lower body dressing: Maximal Assistance - Patient 25 - 49%     Toileting Toileting    Toileting assist Assist for toileting: Dependent - Patient 0%     Transfers Chair/bed transfer  Transfers assist     Chair/bed transfer assist level: Minimal Assistance - Patient > 75%     Locomotion Ambulation   Ambulation assist      Assist level: 2 helpers Assistive device: Walker-rolling Max distance: 44ft   Walk 10 feet activity   Assist     Assist level: 2 helpers Assistive device: Walker-rolling   Walk 50 feet activity   Assist Walk 50 feet with 2 turns activity did not occur: Safety/medical concerns(fatigued)  Walk 150 feet activity   Assist Walk 150 feet activity did not occur: Safety/medical concerns(fatigued)         Walk 10 feet on uneven surface  activity   Assist Walk 10 feet on uneven surfaces activity did not occur: Safety/medical concerns(fatigued)         Wheelchair     Assist Will patient use wheelchair at discharge?: Yes Type of Wheelchair: Manual    Wheelchair assist level: Dependent - Patient 0%      Wheelchair 50 feet with 2 turns activity    Assist        Assist Level: Dependent - Patient 0%   Wheelchair 150 feet activity     Assist      Assist Level: Dependent - Patient 0%      Medical Problem List and Plan: 1.  Cognitive deficits with unintelligible language, weakness, secondary to herpes encephalitis.  Continue CIR             Cont IV acyclovir until 3/21 2.  Antithrombotics: -DVT/anticoagulation:  Pharmaceutical: Lovenox             -antiplatelet therapy: NA 3. Pain Management: PRN medications. Lidocaine patch ordered for left shoulder. Pain better controlled.   3/13- pt appeared to keep pointing at legs like they hurt- will try Lyrica 25 mg BID- if has side effects, will stop-  3/14- too sedated this AM- will stop all med changes made.   4. Mood: Team support             -antipsychotic agents: NA 5. Neuropsych: This patient is not capable of making decisions on his own behalf.  Telesitter for safety  See #14 6. Skin/Wound Care: Routine pressure relief measures.  7. Fluids/Electrolytes/Nutrition: Monitor I/O.   BMP within acceptable range except for glucose on 3/8, labs ordered for Monday 8. Hypothyroid: Continue Synthroid 162mcg. 9. Epileptogenic potential: Conitnue Keppra 500 mg bid for seizure  No seizures from admission-3/12 10. Hypokalemia: Resolved 11. Macrocytic anemia:   Hemoglobin 10.0 on 3/12, labs ordered for Monday  Vitamin B12 within normal limits  Folate WNL  Hemoccult remains pending, discussed   Continue to monitor 12.  Hypoalbuminemia  Supplement initiated on 3/4 13.  Severe Oral Dysphagia and mild to moderate pharyngeal dysphagia:   D1 thins, advance diet as tolerated 14. Lethargy/decreased attention: Medications reviewed and he is not taking any overly sedating medications.   Methylphenidate DC'd, patient alert with deficits related to encephalitis  Sleep chart ordered, consider meds accordingly  3/14- pt sedated this AM- likely hangover from Lyrica 25 mg BID or trazodone 25 mg QHS or both- will stop both 15. Severe kyphosis: position with pillows when sleeping and sitting in  chair, placed nursing order.  16.  Labile blood pressure  Controlled on 3/12 17/ Insomnia  3/13- sleep chart showed didn't sleep well last night- will try trazodone 25 mg QHS at most-   3/14- will d/c trazodone- might benefit from melatonin, but will let Dr Posey Pronto address Monday 18. Hypokalemia  3/13- K+ 3.2- will replete 40 mEq x2 and recheck Monday.   3/14- labs scheduled for Monday.   LOS: 11 days A FACE TO FACE EVALUATION WAS PERFORMED  Eric Larsen 10/29/2019, 12:47 PM

## 2019-10-29 NOTE — Progress Notes (Signed)
Speech Language Pathology Daily Session Note  Patient Details  Name: Eric Larsen MRN: [id number] Date of Birth: September 04, 1930  Today's Date: 10/29/2019 SLP Individual Time: 0816-0900 SLP Individual Time Calculation (min): 44 min  Short Term Goals: Week 2: SLP Short Term Goal 1 (Week 2): Pt will consume dysphagia 2 diet with Mod A cues for use of compensatory swallow strategies to clear oral residue. SLP Short Term Goal 2 (Week 2): Pt will focus attention to basic task for ~ 1 minute with Max A multimodal cues for redirection. SLP Short Term Goal 3 (Week 2): Pt will perform basic counting tasks to 10 with Max A cues. SLP Short Term Goal 4 (Week 2): Pt will read information on calendar to state date with Max A cues.  Skilled Therapeutic Interventions: Pt was seen for skilled ST targeting dysphagia and cognition. Pt was very lethargic, requiring Max A multimodal cues in order to arouse, and also presenting with language of confusion. Total A for orientation to place and situation as well as basic self care tasks such as oral care and washing face. Pt's RN arrived, and a significant amount of time and Max A multimodal cues for arousal, initiation, and sustained attention from SLP required for intake and also oral clearance of crushed medications in puree. Suctioning removed a trace amount of gritty residue at end of session. Did not attempt to assist with breakfast, due to lethargy pt was not appropriate for further intake. Suspect that pt's oral deficits are exacerbated by pt's degree of lethargy and confusion today, as he appears to have been tolerating taking medicines crushed and Dys 2 diet previously (per chart review). Discussed with RN recommendation to only administer medications crushed if pt fully alert and checking or oral clearance afterward - provide suctioning and medications via alternative means if not fully alert today. Also spoke with MD. Pt left sitting upright in bed with alarm set  and needs within reach, telesitter activated. Continue per current plan of care.        Pain Pain Assessment Pain Scale: Faces Faces Pain Scale: No hurt  Therapy/Group: Individual Therapy  Arbutus Leas 10/29/2019, 8:47 AM

## 2019-10-30 ENCOUNTER — Inpatient Hospital Stay (HOSPITAL_COMMUNITY): Payer: Medicare Other | Admitting: Physical Therapy

## 2019-10-30 ENCOUNTER — Inpatient Hospital Stay (HOSPITAL_COMMUNITY): Payer: Medicare Other | Admitting: Speech Pathology

## 2019-10-30 ENCOUNTER — Encounter (HOSPITAL_COMMUNITY): Payer: Medicare Other | Admitting: Psychology

## 2019-10-30 ENCOUNTER — Inpatient Hospital Stay (HOSPITAL_COMMUNITY): Payer: Medicare Other | Admitting: Occupational Therapy

## 2019-10-30 DIAGNOSIS — F0280 Dementia in other diseases classified elsewhere without behavioral disturbance: Secondary | ICD-10-CM

## 2019-10-30 LAB — BASIC METABOLIC PANEL
Anion gap: 9 (ref 5–15)
BUN: 18 mg/dL (ref 8–23)
CO2: 28 mmol/L (ref 22–32)
Calcium: 9.8 mg/dL (ref 8.9–10.3)
Chloride: 99 mmol/L (ref 98–111)
Creatinine, Ser: 0.95 mg/dL (ref 0.61–1.24)
GFR calc Af Amer: >60 mL/min (ref >60)
GFR calc non Af Amer: >60 mL/min (ref >60)
Glucose, Bld: 113 mg/dL — ABNORMAL HIGH (ref 70–99)
Potassium: 3.9 mmol/L (ref 3.5–5.1)
Sodium: 136 mmol/L (ref 135–145)

## 2019-10-30 LAB — CBC WITH DIFFERENTIAL/PLATELET
Abs Immature Granulocytes: 0.09 10*3/uL — ABNORMAL HIGH (ref 0.00–0.07)
Basophils Absolute: 0.1 10*3/uL (ref 0.0–0.1)
Basophils Relative: 1 %
Eosinophils Absolute: 0.2 10*3/uL (ref 0.0–0.5)
Eosinophils Relative: 2 %
HCT: 32.4 % — ABNORMAL LOW (ref 39.0–52.0)
Hemoglobin: 11.2 g/dL — ABNORMAL LOW (ref 13.0–17.0)
Immature Granulocytes: 1 %
Lymphocytes Relative: 13 %
Lymphs Abs: 1.3 10*3/uL (ref 0.7–4.0)
MCH: 34.9 pg — ABNORMAL HIGH (ref 26.0–34.0)
MCHC: 34.6 g/dL (ref 30.0–36.0)
MCV: 100.9 fL — ABNORMAL HIGH (ref 80.0–100.0)
Monocytes Absolute: 0.8 10*3/uL (ref 0.1–1.0)
Monocytes Relative: 8 %
Neutro Abs: 7.9 10*3/uL — ABNORMAL HIGH (ref 1.7–7.7)
Neutrophils Relative %: 75 %
Platelets: 329 10*3/uL (ref 150–400)
RBC: 3.21 MIL/uL — ABNORMAL LOW (ref 4.22–5.81)
RDW: 25.4 % — ABNORMAL HIGH (ref 11.5–15.5)
WBC: 10.3 10*3/uL (ref 4.0–10.5)
nRBC: 0 % (ref 0.0–0.2)

## 2019-10-30 MED ORDER — METHYLPHENIDATE HCL 5 MG PO TABS
5.0000 mg | ORAL_TABLET | Freq: Every day | ORAL | Status: DC
  Administered 2019-10-30 – 2019-11-03 (×5): 5 mg via ORAL
  Filled 2019-10-30 (×5): qty 1

## 2019-10-30 MED ORDER — DEXTROSE 5 % IV SOLN
500.0000 mg | Freq: Two times a day (BID) | INTRAVENOUS | Status: AC
  Administered 2019-10-30 – 2019-11-05 (×13): 500 mg via INTRAVENOUS
  Filled 2019-10-30 (×15): qty 10

## 2019-10-30 NOTE — Consult Note (Signed)
Neuropsychological Consultation   Patient:   Eric Larsen   DOB:   07/25/1931  MR Number:  [id number]  Location:  [facility] [id number] [place] [address] Dept: [place]: [phone]           Date of Service:   10/30/2019  Start Time:   10 AM End Time:   11 AM  Provider/Observer:  Ilean Skill, Psy.D.       Clinical Neuropsychologist       Billing Code/Service: [phone]  Chief Complaint:    Eric Larsen is an 86 year old male who has a history of osteoporosis, gait disorder, hereditary spherocytosis, recent diagnosis of dementia who was admitted on 10/14/2019 with intermittent confusion and progressive weakness over the prior week.  Patient had significant cognitive deficits.  The patient's wife reported a 78-month history of problems with memory and weight loss.  He was found to be febrile with temp of 102.6 and MRI brain was completed showing left anterior medial temporal lobe thickening and T2 hyperintensity gyri with edema and small nonmasslike enhancement left anterior temporal lobe.  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  Aquaphor was initiated IV.  The patient has had significant cognitive deficits and encephalopathy.  The patient's wife reports that he had been having troubles with sundowning and worsening of symptoms over the prior 6 months.  Acute worsening recently.  The patient had significant expressive language deficits, difficulty following commands and significant weakness.  The patient's cognitive function appears to improve somewhat but he continues with significant/profound expressive language deficits that include circumlocutions and paraphasic errors.  Reason for Service:  The patient was referred for neuropsychological consultation due to significant cognitive deficits.  The patient's wife was there today for the visit.  Below is the  HPI for the current admission.  HPI:  Eric Larsen is an 86 year old male with history of osteoporosis, gait disorder, heriditary spherocytosis, recent diagnosis of dementia who was admitted on 10/14/2019 with intermittent confusion and progressive weakness x1 week.  History taken from wife due to cognition.  Wife also reports 6 month history of problems with memory and weight loss. He was found to be febrile with temp of 102.6 and MRI brain done showing left anterior medial temporal lobe thickening and T2 hyperintensity gyri with edema and small nonmasslike enhancement left anterior temporal lobe.  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  Empiric IV acyclovir was initiated.  TSH- 2.289 and levothyroxine increased to 125 mcg/day. EEG done showing sharp waves in left anterior temporal lesion with mild to moderate diffuse encephalopathy.  He was loaded with Keppra but continued to have issues with confusion and bouts of lethargy.  Hospital course further complicated by dysphagia due to tendency to hold orals and therefore he was placed on a dysphagia 1, thin liquid diet.  LT-EEG was negative for seizures but did show epileptogenicity and is to continue Keppra.  Blood cultures x2 done in 1+ for staph felt to be contaminant. LP was ordered for work-up and showed elevated protein-139 and lymphocytosis with WBC 84.  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  Patient with resultant cognitive deficits with unintelligible language, difficulty following commands, and significant weakness requiring STEDY to stand.  CIR recommended due to functional decline.  Please see preadmission assessment from earlier today as well.  Current Status:  The patient appeared to  be aware of what was going on for the most part and understanding his medical status.  However, there was significant and profound expressive language deficits that included circumlocutions  and paraphasic errors as well as halting of speech.  He had difficulty reading simple get well cards and following sentence structure.  Memory deficits and expressive language deficits were all noted.  Behavioral Observation: Eric Larsen  presents as a 86 y.o.-year-old Right Caucasian Male who appeared his stated age. his dress was Appropriate and he was Well Groomed and his manners were Appropriate to the situation.  his participation was indicative of Appropriate and Inattentive behaviors.  There were any physical disabilities noted.  he displayed an appropriate level of cooperation and motivation.     Interactions:    Active Inattentive  Attention:   abnormal and attention span appeared shorter than expected for age  Memory:   abnormal; global memory impairment noted  Visuo-spatial:  not examined  Speech (Volume):  low  Speech:   non-fluent aphasia; slurred  Thought Process:  Tangential  Though Content:  WNL; not suicidal and not homicidal  Orientation:   person, place and situation  Judgment:   Poor  Planning:   Poor  Affect:    Flat  Mood:    Dysphoric  Insight:   Shallow  Intelligence:   normal  Medical History:   Past Medical History:  Diagnosis Date  . Hypothyroidism   . Osteoporosis   . Thyroid disease     Psychiatric History:  No prior psychiatric history noted.  Family Med/Psych History:  Family History  Problem Relation Age of Onset  . Hereditary spherocytosis Father   . Hereditary spherocytosis Sister     Impression/DX:  Eric Larsen is an 86 year old male who has a history of osteoporosis, gait disorder, hereditary spherocytosis, recent diagnosis of dementia who was admitted on 10/14/2019 with intermittent confusion and progressive weakness over the prior week.  Patient had significant cognitive deficits.  The patient's wife reported a 23-month history of problems with memory and weight loss.  He was found to be febrile with temp of 102.6 and  MRI brain was completed showing left anterior medial temporal lobe thickening and T2 hyperintensity gyri with edema and small nonmasslike enhancement left anterior temporal lobe.  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  Aquaphor was initiated IV.  The patient has had significant cognitive deficits and encephalopathy.  The patient's wife reports that he had been having troubles with sundowning and worsening of symptoms over the prior 6 months.  Acute worsening recently.  The patient had significant expressive language deficits, difficulty following commands and significant weakness.  The patient's cognitive function appears to improve somewhat but he continues with significant/profound expressive language deficits that include circumlocutions and paraphasic errors.  The patient appeared to be aware of what was going on for the most part and understanding his medical status.  However, there was significant and profound expressive language deficits that included circumlocutions and paraphasic errors as well as halting of speech.  He had difficulty reading simple get well cards and following sentence structure.  Memory deficits and expressive language deficits were all noted.  The patient's wife reports that he had been having increasing cognitive difficulties but nothing to the degree that developed over the prior week of hospitalization.  The patient was having greater difficulty at night and according to her was "sundowning."  The patient has had acute exacerbation of his cognitive deficits likely directly related to  viral infection in the brain.  He has shown some improvements but continues with significant memory and expressive language deficits.  Disposition/Plan:  Will follow up with the patient next week.  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         Electronically Signed   _______________________ Ilean Skill,  Psy.D.

## 2019-10-30 NOTE — Progress Notes (Signed)
Physical Therapy Session Note  Patient Details  Name: Eric Larsen MRN: [id number] Date of Birth: May 06, 1931  Today's Date: 10/30/2019 PT Individual Time: 0807-0900 PT Individual Time Calculation (min): 53 min   Short Term Goals: Week 2:  PT Short Term Goal 1 (Week 2): Will be able to perform bed mobility mod A PT Short Term Goal 2 (Week 2): Pt will perform bed<>chair transfer with LRAD mod A PT Short Term Goal 3 (Week 2): Pt will transfer sit<>stand with LRAD min A  Skilled Therapeutic Interventions/Progress Updates:  Pt received in bed & agreeable to tx. When asked his preferred name pt unable to report "[name]" with nurse providing this information. Pt transfers supine>sit with total assist with therapist placing UE on bed rails total assist and extra time. Pt very rigid in all extremities and not assisting with movement. Pt requires up to max assist for sitting balance EOB 2/2 kyphosis curvature and pt leaning back, unable to anterior weight shift trunk. Therapist provides dependent assist for donning clean brief 2/2 urinary incontinence, pants, and shoes. Pt transfers sit<>stand EOB with max assist + 2, first with HHA, then attempting to place chair in front of pt to increase his comfort with task, then with RW. Pt demonstrates R lateral & posterior lean in standing, requiring significant assistance for manual facilitation for weight shifting L to allow increased ease of advancing RLE. Pt transfers bed>w/c and w/c>mat table with +2 assist with RW with max cuing for sequencing steps and overall movement. Transported pt to/from gym via w/c dependent assist for time management. While pt sat EOM, focused on sitting balance & anterior weight shifting by attempting to have pt retreive ball from floor but pt unable to follow commands, so focused on pt retrieving ball from PT student sitting in front of him with pt requiring MAX cuing to follow commands and assistance with placing BUE hands on ball. Pt  then ambulates 15 ft with RW & +2 assist with therapist still providing manual facilitation for weight shifting L and assistance with managing RW. At end of session pt left in TIS w/c with chair alarm donned, call bell in lap, telesitter in room.  Therapy Documentation Precautions:  Precautions Precautions: Fall Precaution Comments: dementia, slow processing/initiation/poor attention Restrictions Weight Bearing Restrictions: No  Pain: Pain Assessment Pain Scale: Faces Pain Score: 0-No pain    Therapy/Group: Individual Therapy  Waunita Schooner 10/30/2019, 12:30 PM

## 2019-10-30 NOTE — Progress Notes (Signed)
[facility]ICAL MEDICINE [facility] REHABILITATION PROGRESS NOTE  Subjective/Complaints: [name] continues to be sedated in the morning despite discontinuation of his sleep and pain medications. It has been very difficult for his nurse to get him to take his medications. Hgb improved this morning.   ROS: sedated, cannot due to cognition   Objective: Vital Signs: Blood pressure 122/74, pulse 81, temperature (!) 96.8 F (36 C), temperature source Axillary, resp. rate 16, height 5\' 4"  (1.626 m), weight 50.8 kg, SpO2 98 %. No results found. Recent Labs    10/28/19 0125 10/30/19 0340  WBC 6.9 10.3  HGB 10.1* 11.2*  HCT 29.1* 32.4*  PLT 297 329   Recent Labs    10/28/19 0125 10/30/19 0340  NA 134* 136  K 3.2* 3.9  CL 96* 99  CO2 30 28  GLUCOSE 117* 113*  BUN 21 18  CREATININE 0.88 0.95  CALCIUM 9.6 9.8    Physical Exam: BP 122/74 (BP Location: Left Arm)   Pulse 81   Temp (!) 96.8 F (36 C) (Axillary)   Resp 16   Ht 5\' 4"  (1.626 m)   Wt 50.8 kg   SpO2 98%   BMI 19.22 kg/m  Constitutional: frail appearing male, sedated, lethargic- not speaking this AM, opened eyes x1 2nd time went in, but otherwise, very sleepy, NAD HENT: kept eyes closed most of time Cardiovascular:no JVD; quiet heard sounds Respiratory: CTA B/L- a little coarse at bases, but good air movement GI: soft, NT, ND, (+) hypoactive BS Skin: Warm and dry.  Intact. Psych: sedated Musc: No edema in extremities.  No tenderness in extremities. Neurological:sedated; lethargic- hangiver from trazodone? Motor: Appears to be 4+/5 throughout, unchanged  Assessment/Plan: 1. 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  Physiatrist is providing close team supervision and 24 hour management of active medical problems listed below.  Physiatrist and rehab team continue to assess barriers to  discharge/monitor patient progress toward functional and medical goals  Care Tool:  Bathing    Body parts bathed by patient: Right arm, Left arm, Chest, Abdomen, Face, Left upper leg, Right upper leg   Body parts bathed by helper: Left lower leg, Right lower leg, Front perineal area, Buttocks     Bathing assist Assist Level: 2 Helpers     Upper Body Dressing/Undressing Upper body dressing   What is the patient wearing?: Pull over shirt    Upper body assist Assist Level: Maximal Assistance - Patient 25 - 49%    Lower Body Dressing/Undressing Lower body dressing      What is the patient wearing?: Incontinence brief, Pants     Lower body assist Assist for lower body dressing: Maximal Assistance - Patient 25 - 49%     Toileting Toileting    Toileting assist Assist for toileting: Dependent - Patient 0%     Transfers Chair/bed transfer  Transfers assist     Chair/bed transfer assist level: 2 Helpers     Locomotion Ambulation   Ambulation assist      Assist level: 2 helpers Assistive device: Walker-rolling Max distance: 15 ft   Walk 10 feet activity   Assist     Assist level: 2 helpers Assistive device: Walker-rolling   Walk 50 feet activity   Assist Walk 50 feet with 2 turns activity did not occur: Safety/medical concerns(fatigued)         Walk 150 feet activity  Assist Walk 150 feet activity did not occur: Safety/medical concerns(fatigued)         Walk 10 feet on uneven surface  activity   Assist Walk 10 feet on uneven surfaces activity did not occur: Safety/medical concerns(fatigued)         Wheelchair     Assist Will patient use wheelchair at discharge?: Yes Type of Wheelchair: Manual    Wheelchair assist level: Dependent - Patient 0%      Wheelchair 50 feet with 2 turns activity    Assist        Assist Level: Dependent - Patient 0%   Wheelchair 150 feet activity     Assist     Assist Level:  Dependent - Patient 0%      Medical Problem List and Plan: 1.  Cognitive deficits with unintelligible language, weakness, secondary to herpes encephalitis.  Continue CIR             Cont IV acyclovir until 3/21 2.  Antithrombotics: -DVT/anticoagulation:  Pharmaceutical: Lovenox             -antiplatelet therapy: NA 3. Pain Management: PRN medications. Lidocaine patch ordered for left shoulder. Pain better controlled.   3/13- pt appeared to keep pointing at legs like they hurt- will try Lyrica 25 mg BID- if has side effects, will stop-  3/14- too sedated this AM- will stop all med changes made.   4. Mood: Team support             -antipsychotic agents: NA 5. Neuropsych: This patient is not capable of making decisions on his own behalf.  Telesitter for safety  See #14 6. Skin/Wound Care: Routine pressure relief measures.  7. Fluids/Electrolytes/Nutrition: Monitor I/O.   BMP within acceptable range except for glucose on 3/8, labs ordered for Monday 8. Hypothyroid: Continue Synthroid 111mcg. 9. Epileptogenic potential: Conitnue Keppra 500 mg bid for seizure  No seizures from admission-3/12 10. Hypokalemia: Resolved 11. Macrocytic anemia:   Hemoglobin 10.0 on 3/12, labs ordered for Monday  Vitamin B12 within normal limits  Folate WNL  Hemoccult remains pending, discussed   Continue to monitor 12.  Hypoalbuminemia  Supplement initiated on 3/4 13.  Severe Oral Dysphagia and mild to moderate pharyngeal dysphagia:   D1 thins, advance diet as tolerated  Reviewed 3/15 SLP note: trialed D2 meal with thins, had prolonged AP transit and mild oral residue. Continue current diet.  14. Lethargy/decreased attention: Medications reviewed and he is not taking any overly sedating medications.   Methylphenidate DC'd, patient alert with deficits related to encephalitis  Sleep chart ordered, consider meds accordingly  3/14- pt sedated this AM- likely hangover from Lyrica 25 mg BID or trazodone 25 mg  QHS or both- will stop both  3/15: Still overly sedated. DC melatonin and zyprexa.  15. Severe kyphosis: position with pillows when sleeping and sitting in chair, placed nursing order.  16.  Labile blood pressure  Controlled on 3/12 17/ Insomnia  3/13- sleep chart showed didn't sleep well last night- will try trazodone 25 mg QHS at most-   3/14- will d/c trazodone- might benefit from melatonin, but will let Dr Posey Pronto address Monday  3/15: Avoid sleep agents at this time since they overly sedate patient.  18. Hypokalemia  3/13- K+ 3.2- will replete 40 mEq x2 and recheck Monday.   3/14- labs scheduled for Monday.   3/15: 3.9, stable.   LOS: 12 days A FACE TO FACE EVALUATION WAS PERFORMED  Izora Ribas 10/30/2019,  1:16 PM

## 2019-10-30 NOTE — Progress Notes (Signed)
Occupational Therapy Session Note  Patient Details  Name: Eric Larsen MRN: [id number] Date of Birth: March 08, 1931  Today's Date: 10/30/2019 OT Individual Time: 1305-1400 OT Individual Time Calculation (min): 55 min    Short Term Goals: Week 2:  OT Short Term Goal 1 (Week 2): Pt will maintain static sitting balance with supervision for at least 3 mins in perparation for selfcare tasks. OT Short Term Goal 2 (Week 2): Pt will complete UB bathing with no more than min instructional cueing for initiation and sequencing. OT Short Term Goal 3 (Week 2): Pt will donn a pullover shirt with no more than min assist in supported sitting for two consecutive trials. OT Short Term Goal 4 (Week 2): Pt will complete toilet transfers with min assist for two consecutive trials OT Short Term Goal 5 (Week 2): Pt will complete LB dressing with mod assist at sit > stand level  Skilled Therapeutic Interventions/Progress Updates:    Treatment session with focus on following one step commands during self-feeding and standing balance activity.  Pt received upright in w/c in handoff from SLP.  SLP reports pt having difficulty positioning spoon to mouth during self-feeding.  This therapist provided pt with angled spoon and fork to assist with positioning/angle of utensil to increase success.  Pt able to better feed self with adaptive utensil.  Educated pt's wife about keeping adaptive utensils in room to allow for increased success with self-feeding.  Pt initially resistant to therapy session but unable to verbalize reason.  Encouraged pt to engage in standing activity.  Mod assist +2 to facilitate anterior weight shift for sit > stand throughout session.  Facilitation at hips and neck to increase upright standing posture with pt unable to maintain without total assist.  Hand over hand provided during reaching activity to facilitate increased upright posture, pt unable to replicate activity without hand over hand assistance.   Returned to room and completed squat pivot transfer to Rt with max assist +2 back to bed.  Therapy Documentation Precautions:  Precautions Precautions: Fall Precaution Comments: dementia, slow processing/initiation/poor attention Restrictions Weight Bearing Restrictions: No General:   Vital Signs: Therapy Vitals Temp: 98.1 F (36.7 C) Pulse Rate: 91 Resp: 18 BP: (!) 95/57 Patient Position (if appropriate): Lying Oxygen Therapy SpO2: 99 % O2 Device: Room Air Pain:  Pt with no c/o pain   Therapy/Group: Individual Therapy  [name] 10/30/2019, 3:19 PM

## 2019-10-30 NOTE — Plan of Care (Signed)
  Problem: RH Expression Communication Goal: LTG Patient will increase speech intelligibility (SLP) Description: LTG: Patient will increase speech intelligibility at word/phrase/conversation level with cues, % of the time (SLP) Flowsheets (Taken 10/30/2019 1322) LTG: Patient will increase speech intelligibility (SLP): Moderate Assistance - Patient 50 - 74% Level: Phrase Percent of time patient will use intelligible speech: 75%

## 2019-10-30 NOTE — Progress Notes (Addendum)
Daily Progress Note   Patient Name: Eric Larsen       Date: 10/30/2019 DOB: 08-02-1931  Age: 86 y.o. MRN#: [id number] Attending Physician: Eric Arn, MD Primary Care Physician: Eric Gene, MD Admit Date: 10/18/2019  Reason for Consultation/Follow-up: Establishing goals of care and Psychosocial/spiritual support   Patient was overly sedated for the last 36 hours.  Likely due to a combination of Lyrica, Olanzapine and Trazdone.  He is now back to baseline.  Subjective:  On my entering the room - Eric Larsen is leaning over Eric Larsen trying to understand what he is saying and respond to his needs.    She expresses how helpful it was that she was able to speak with Rehab MD.  We discussed her plans - Eric Larsen can walk with little assistance.  If he is continent of bowel and bladder she can take him home with her.  Eric Chew, PA explains that Eric Larsen will put Eric Larsen on a schedule - she recommends that every 4 hours and 30 minutes after every meal take [name] to the toilet.   We discuss this further - Eric Larsen states there are no funds to hire private assistance and there is no family to help at home.  She will be the sole support.  She does not want to put [name] in a SNF as they will not be able to understand his needs and she will not be able to visit him.  She is hopeful that the [place] will help.  She's going to the [place] at 7:30 am.  We discussed [facility].  They have had difficulty supporting their existing patient base given COVID and social distancing restrictions.  [facility] patient's are only going to [facility] once a week per Olin Hauser.  I listened and tried to offer support.  Eric Larsen was appreciative of my visit.     She asked for on-going Palliative follow up.    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  Now much improved.  Able to walk but has expressive aphasia and his cognition has been severely impacted.  Incontinent of bowel and bladder. +Episodes of sundowning.   Length of Stay: 12  Current Medications: Scheduled Meds:  . calcium-vitamin D  1 tablet Oral BID WC  . Chlorhexidine Gluconate Cloth  6  each Topical Daily  . enoxaparin (LOVENOX) injection  40 mg Subcutaneous Q24H  . feeding supplement (PRO-STAT SUGAR FREE 64)  30 mL Oral BID  . fluticasone  1 spray Each Nare Daily  . folic acid  [id number] mcg Oral Daily  . levothyroxine  125 mcg Oral QAC breakfast  . lidocaine  1 patch Transdermal Q24H  . senna-docusate  1 tablet Oral QHS  . sodium chloride flush  10-40 mL Intracatheter Q12H    Continuous Infusions: . acyclovir    . levETIRAcetam 500 mg (10/30/19 0917)    PRN Meds: acetaminophen, alum & mag hydroxide-simeth, bisacodyl, diphenhydrAMINE, guaiFENesin-dextromethorphan, polyethylene glycol, prochlorperazine **OR** prochlorperazine **OR** prochlorperazine, sodium chloride flush, sodium phosphate  Physical Exam        Pleasant elderly male, awake, responsive, +expressive aphasia CV rrr no m/r/g Resp NAD Abdomen soft, nt, nd  Vital Signs: BP (!) 95/57 (BP Location: Left Arm)   Pulse 91   Temp 98.1 F (36.7 C)   Resp 18   Ht 5\' 4"  (1.626 m)   Wt 50.8 kg   SpO2 99%   BMI 19.22 kg/m  SpO2: SpO2: 99 % O2 Device: O2 Device: Room Air O2 Flow Rate:    Intake/output summary:   Intake/Output Summary (Last 24 hours) at 10/30/2019 1558 Last data filed at 10/30/2019 1433 Gross per 24 hour  Intake 300 ml  Output --  Net 300 ml   LBM: Last BM Date: 10/26/19 Baseline Weight: Weight: 57.2 kg Most recent weight: Weight: 50.8 kg       Palliative Assessment/Data: 40%      Patient Active Problem List   Diagnosis Date Noted  . Dementia associated with other underlying disease without behavioral disturbance  ([place])   . Palliative care encounter   . Lethargy   . Acute blood loss anemia   . Hypoalbuminemia due to protein-calorie malnutrition ([place])   . Microcytic anemia   . Dysphagia   . Encephalitis 10/18/2019  . Abnormal EEG   . Hypothyroidism   . Macrocytic anemia   . 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 Hip fracture ([place]) 08/12/2017  . Hallux valgus of left foot 04/28/2017  . Hammer toe of left foot 04/28/2017  . Corn of toe 04/28/2017  . History of total left hip replacement 09/19/2015  . Spherocytosis (familial) ([place]) 09/17/2015  . Mixed hyperlipidemia 06/19/2015  . Benign prostatic hyperplasia with lower urinary tract symptoms 06/19/2015  . History of fracture of vertebra 11/06/2014  . History of colon polyps 08/08/2014  . Pathological fracture 11/01/2013  . Toenail fungus 01/20/2013  . Vitamin D deficiency 09/27/2012  . Primary hypothyroidism 09/27/2012  . Osteoporosis 09/27/2012  . Spherocytosis ([place]) 12/04/2011  . Skin cancer 12/04/2011    Palliative Care Plan    Recommendations/Plan: PMT will follow intermittently for support.  Wife is still uncertain of patients new baseline or how to care for him.  She needs help with anticipating his needs.  She was advised by SW that discharge will be on or before 3/27. If patient has difficulty with sundowning consider Olanzapine 1.25 mg daily at 5:00 pm. Recommend outpatient Palliative follow up for support and GOC at next venue whether that is SNF or home.  Goals of Care and Additional Recommendations: Limitations on Scope of Treatment: Full Scope Treatment  Code Status:  Full code  Prognosis:  Unable to determine   Discharge Planning: To Be Determined Wife will need additional support in the home if she  decides to take him home.  Care plan was discussed with Wife and Eric Chew, PA-C  Thank you for allowing the Palliative Medicine Team to assist in the care of this patient.  Total time  spent:  25 min.     Greater than 50%  of this time was spent counseling and coordinating care related to the above assessment and plan.  Eric Jenny, PA-C Palliative Medicine  Please contact Palliative MedicineTeam phone at [phone] for questions and concerns between 7 am - 7 pm.   Please see AMION for individual provider pager numbers.

## 2019-10-30 NOTE — Progress Notes (Signed)
Speech Language Pathology Daily Session Note  Patient Details  Name: Nikai Schreyer MRN: [id number] Date of Birth: 08-10-1931  Today's Date: 10/30/2019 SLP Individual Time: 1230-1305 SLP Individual Time Calculation (min): 35 min  Short Term Goals: Week 2: SLP Short Term Goal 1 (Week 2): Pt will consume dysphagia 2 diet with Mod A cues for use of compensatory swallow strategies to clear oral residue. SLP Short Term Goal 2 (Week 2): Pt will focus attention to basic task for ~ 1 minute with Max A multimodal cues for redirection. SLP Short Term Goal 3 (Week 2): Pt will perform basic counting tasks to 10 with Max A cues. SLP Short Term Goal 4 (Week 2): Pt will read information on calendar to state date with Max A cues.  Skilled Therapeutic Interventions: Skilled treatment session focused on dysphagia goals. SLP facilitated session by providing skilled observation with lunch meal of Dys. 2 textures with thin liquids. Patient with minimal overt s/s of aspiration that appeared to be related to talking with a full oral cavity. Patient demonstrated prolonged AP transit and mild oral residue that cleared with a liquid wash. Recommend patient continue current diet with full supervision. Patient's wife present and provided appropriate verbal and tactile cues for safe PO intake, therefore, patient's wife was signed off to provide supervision. She was also educated on the importance of oral care after PO intake, she verbalized understanding. Patient handed off to OT. Continue with current plan of care.      Pain Pain Assessment Pain Scale: Faces Pain Score: 0-No pain  Therapy/Group: Individual Therapy  Dustee Bottenfield 10/30/2019, 1:12 PM

## 2019-10-30 NOTE — Progress Notes (Signed)
Pharmacy Antimicrobial Note  Eric Larsen is a 86 y.o. male admitted on 10/18/2019 with altered mental status.  Pharmacy has been consulted for Acyclovir dosing for HSV 1 encephalitis.   SCr 0.95, CrCl ~ 40 ml/min. Plan to treat for 3 weeks through 3/21.  Plan: Adjust Acyclovir to 500mg  IV every 12 hours, last day 11/05/19 Monitor renal function (BMP Wed), LOT  Height: 5\' 4"  (162.6 cm) Weight: 111 lb 15.9 oz (50.8 kg) IBW/kg (Calculated) : 59.2  Temp (24hrs), Avg:97.7 F (36.5 C), Min:96.8 F (36 C), Max:98.2 F (36.8 C)  Recent Labs  Lab 10/27/19 0405 10/28/19 0125 10/30/19 0340  WBC 6.8 6.9 10.3  CREATININE  --  0.88 0.95    Estimated Creatinine Clearance: 40.1 mL/min (by C-G formula based on SCr of 0.95 mg/dL).    No Known Allergies  Antimicrobials   Acyclovir 2/28>>(3/21) Cefepime 2/27 Vanc 2/27 MTZ 2/27   Microbiology 10/16/19 CSF + HSV1    Bertis Ruddy, PharmD Clinical Pharmacist Please check AMION for all [place] numbers 10/30/2019 8:10 AM

## 2019-10-31 ENCOUNTER — Inpatient Hospital Stay (HOSPITAL_COMMUNITY): Payer: Medicare Other

## 2019-10-31 ENCOUNTER — Inpatient Hospital Stay (HOSPITAL_COMMUNITY): Payer: Medicare Other | Admitting: Occupational Therapy

## 2019-10-31 ENCOUNTER — Inpatient Hospital Stay (HOSPITAL_COMMUNITY): Payer: Medicare Other | Admitting: Speech Pathology

## 2019-10-31 NOTE — Progress Notes (Signed)
Speech Language Pathology Daily Session Note  Patient Details  Name: Eric Larsen MRN: [id number] Date of Birth: 01-27-1931  Today's Date: 10/31/2019 SLP Individual Time: 0720-0810 SLP Individual Time Calculation (min): 50 min  Short Term Goals: Week 2: SLP Short Term Goal 1 (Week 2): Pt will consume dysphagia 2 diet with Mod A cues for use of compensatory swallow strategies to clear oral residue. SLP Short Term Goal 2 (Week 2): Pt will focus attention to basic task for ~ 1 minute with Max A multimodal cues for redirection. SLP Short Term Goal 3 (Week 2): Pt will perform basic counting tasks to 10 with Max A cues. SLP Short Term Goal 4 (Week 2): Pt will read information on calendar to state date with Max A cues. SLP Short Term Goal 5 (Week 2): Pt will utilize speech intelligibility strategies at the phrase level to achieve 50% intelligibility with Max verbal cues.  Skilled Therapeutic Interventions:  Skilled treatment session focused on ongoing dysphagia treatment and cognition goals. SLP facilitiated the session by providing Total A multimodal cues for pt to arouse and maintain arousal for consumption of his breakfast tray. Pt required Total A for feeding likely d/t fatigue. When consuming pureed fruit, pt with immediate weak cough and subjective voice change suggestive of pharyngeal residue. Given pt's ineffective cough, he continued to cough for substantial period of time. After recovering from episode, pt consumed sips of thin water with cough after 3rd sip suggestive of inattention to bolus, premature spillage and delayed swallow initiation. Pt had been consuming without overt s/s of aspiration but nursing to continue monitoring during consumption today. Pt required Total A multimodal cues to focus attention to task and attempts at increasing pt's intelligibility lead to further confusion and off-topic responses. Pt left upright in bed, asleep with all needs within reach. Continue per  current plan of care.      Pain    Therapy/Group: Individual Therapy  Mercy Leppla 10/31/2019, 12:00 PM

## 2019-10-31 NOTE — Progress Notes (Signed)
Pt very lethargic at this time. Pt will answer when spoken to but will not open his eyes. Verbal orders given from [place], [place] to hold PO medications, Urine culture, CBC/CMET stat.

## 2019-10-31 NOTE — Progress Notes (Signed)
Occupational Therapy Session Note  Patient Details  Name: Eric Larsen MRN: [id number] Date of Birth: [birth year]  Today's Date: 10/31/2019 OT Individual Time: [id number] and [id number] OT Individual Time Calculation (min): 57 min and 30 min and Today's Date: 10/31/2019 OT Missed Time: 18 Minutes Missed Time Reason: Patient fatigue;Other (comment)(unable to maintain arousal)   Short Term Goals: Week 2:  OT Short Term Goal 1 (Week 2): Pt will maintain static sitting balance with supervision for at least 3 mins in perparation for selfcare tasks. OT Short Term Goal 2 (Week 2): Pt will complete UB bathing with no more than min instructional cueing for initiation and sequencing. OT Short Term Goal 3 (Week 2): Pt will donn a pullover shirt with no more than min assist in supported sitting for two consecutive trials. OT Short Term Goal 4 (Week 2): Pt will complete toilet transfers with min assist for two consecutive trials OT Short Term Goal 5 (Week 2): Pt will complete LB dressing with mod assist at sit > stand level  Skilled Therapeutic Interventions/Progress Updates:    1) Treatment session with focus on sitting balance and UB bathing/dressing.  Pt received supine in bed easily aroused to stimulus.  Pt agreeable to bathing and dressing seated EOB with focus on sitting balance and postural control.  Max assist bed mobility to come to sitting EOB.  Pt with severe kyphotic posture and posterior lean requiring +2 for sitting balance.  Engaged in [place] bathing and dressing with +2 for sitting balance and max multimodal cues for sequencing of dressing.  Pt unable to engage in any aspects of bathing, despite increased time and hand over hand assist to initiate and complete tasks.  Total assist +2 to complete UB dressing with focus on initiation and sitting balance.  Engaged in [place] seated EOB with total assist +2 for sit > partial stand then total assist to maintain positioning to allow 2nd person to  pull pants over hips.  Pt with progressively decreased arousal, therefore returned to supine in bed +2.  2) Treatment session with focus on postural control and sit > stand.  Pt received in TIS w/c with pt flexed forward.  Engaged in PROM and stretching to neck to facilitate increased neck extension to promote more upright posture.  Pt able to achieve increased extension but when assist removed, pt would return to same flexed posture.  Engaged in sit > stand in front of mirror to provide visual input for upright posture.  +2 for sit > stand with pt unable to achieve upright standing posture this session.  Utilized sheet at hips to further facilitate improved hip and trunk control however still unable to achieve upright position.  Pt maintaining eyes closed majority of session despite multimodal cues.  Returned to room and pt declined transferring back to bed.  Notified RN of positioning and recommending use of maximove for transfer back to bed when pt ready due to complicated transfer at this time.  Therapy Documentation Precautions:  Precautions Precautions: Fall Precaution Comments: dementia, slow processing/initiation/poor attention Restrictions Weight Bearing Restrictions: No General: General OT Amount of Missed Time: 18 Minutes Pain:  Pt with no c/o pain   Therapy/Group: Individual Therapy  [name] 10/31/2019, 3:14 PM

## 2019-10-31 NOTE — Progress Notes (Signed)
Patient drowsy and coughing after eating/drinking anything. Patient sounds gurgly and was orally suctioned clear saliva. Lungs diminished bilateral. VS WNL and no change in assessment. Notified provider and a CXR was ordered.

## 2019-10-31 NOTE — Plan of Care (Signed)
  Problem: Consults Goal: RH GENERAL PATIENT EDUCATION Description: See Patient Education module for education specifics. Outcome: Progressing   Problem: RH BOWEL ELIMINATION Goal: RH STG MANAGE BOWEL WITH ASSISTANCE Description: STG Manage Bowel with Assistance. Mod  Outcome: Progressing   Problem: RH BLADDER ELIMINATION Goal: RH STG MANAGE BLADDER WITH ASSISTANCE Description: STG Manage Bladder With Assistance. Mod Outcome: Progressing   Problem: RH SKIN INTEGRITY Goal: RH STG MAINTAIN SKIN INTEGRITY WITH ASSISTANCE Description: STG Maintain Skin Integrity With Assistance. Mod Outcome: Progressing   Problem: RH SAFETY Goal: RH STG ADHERE TO SAFETY PRECAUTIONS W/ASSISTANCE/DEVICE Description: STG Adhere to Safety Precautions With Assistance/Device. Mod Outcome: Progressing   Problem: RH PAIN MANAGEMENT Goal: RH STG PAIN MANAGED AT OR BELOW PT'S PAIN GOAL Description: Less than 3 Outcome: Progressing

## 2019-10-31 NOTE — Progress Notes (Signed)
[facility] PHYSICAL MEDICINE & REHABILITATION PROGRESS NOTE  Subjective/Complaints: Mental status improved from yesterday morning but worse than yesterday afternoon.  Had long discussion with patient and his wife yesterday afternoon. All sedating medication discontinued and will trial Ritalin again to stimulate during the day.   ROS: sedated, cannot due to cognition   Objective: Vital Signs: Blood pressure 99/64, pulse 69, temperature (!) 97.4 F (36.3 C), temperature source Oral, resp. rate 19, height 5\' 4"  (1.626 m), weight 50.8 kg, SpO2 99 %. No results found. Recent Labs    10/30/19 0340  WBC 10.3  HGB 11.2*  HCT 32.4*  PLT 329   Recent Labs    10/30/19 0340  NA 136  K 3.9  CL 99  CO2 28  GLUCOSE 113*  BUN 18  CREATININE 0.95  CALCIUM 9.8    Physical Exam: BP 99/64 (BP Location: Left Arm)   Pulse 69   Temp (!) 97.4 F (36.3 C) (Oral)   Resp 19   Ht 5\' 4"  (1.626 m)   Wt 50.8 kg   SpO2 99%   BMI 19.21 kg/m  Constitutional: frail appearing male, speaking this morning, but appears fatigued and weak.  HENT: kept eyes closed most of time Cardiovascular:no JVD; quiet heard sounds Respiratory: CTA B/L- a little coarse at bases, but good air movement GI: soft, NT, ND, (+) hypoactive BS Skin: Warm and dry.  Intact. Psych: sedated Musc: No edema in extremities.  No tenderness in extremities. Neurological:sedated; lethargic- hangiver from trazodone? Motor: Appears to be 4+/5 throughout, unchanged  Assessment/Plan: 1. 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  Physiatrist is providing close team supervision and 24 hour management of active medical problems listed below.  Physiatrist and rehab team continue to assess barriers to discharge/monitor patient progress toward functional and medical goals  Care Tool:  Bathing    Body parts bathed by  patient: Right arm, Left arm, Chest, Abdomen, Face, Left upper leg, Right upper leg   Body parts bathed by helper: Left lower leg, Right lower leg, Front perineal area, Buttocks     Bathing assist Assist Level: 2 Helpers     Upper Body Dressing/Undressing Upper body dressing   What is the patient wearing?: Pull over shirt    Upper body assist Assist Level: Maximal Assistance - Patient 25 - 49%    Lower Body Dressing/Undressing Lower body dressing      What is the patient wearing?: Incontinence brief, Pants     Lower body assist Assist for lower body dressing: Maximal Assistance - Patient 25 - 49%     Toileting Toileting    Toileting assist Assist for toileting: Dependent - Patient 0%     Transfers Chair/bed transfer  Transfers assist     Chair/bed transfer assist level: 2 Helpers     Locomotion Ambulation   Ambulation assist      Assist level: 2 helpers Assistive device: Walker-rolling Max distance: 15 ft   Walk 10 feet activity   Assist     Assist level: 2 helpers Assistive device: Walker-rolling   Walk 50 feet activity   Assist Walk 50 feet with 2 turns activity did not occur: Safety/medical concerns(fatigued)         Walk 150 feet activity   Assist Walk 150 feet activity did not occur: Safety/medical concerns(fatigued)         Walk 10 feet on uneven surface  activity   Assist Walk 10 feet on uneven surfaces activity did not occur: Safety/medical concerns(fatigued)         Wheelchair     Assist Will patient use wheelchair at discharge?: Yes Type of Wheelchair: Manual    Wheelchair assist level: Dependent - Patient 0%      Wheelchair 50 feet with 2 turns activity    Assist        Assist Level: Dependent - Patient 0%   Wheelchair 150 feet activity     Assist     Assist Level: Dependent - Patient 0%      Medical Problem List and Plan: 1.  Cognitive deficits with unintelligible language,  weakness, secondary to herpes encephalitis.  Continue CIR             Cont IV acyclovir until 3/21 2.  Antithrombotics: -DVT/anticoagulation:  Pharmaceutical: Lovenox             -antiplatelet therapy: NA 3. Pain Management: PRN medications. Lidocaine patch ordered for left shoulder. Pain better controlled.   3/13- pt appeared to keep pointing at legs like they hurt- will try Lyrica 25 mg BID- if has side effects, will stop-  3/14- too sedated this AM- will stop all med changes made.   4. Mood: Team support             -antipsychotic agents: NA 5. Neuropsych: This patient is not capable of making decisions on his own behalf.  Telesitter for safety  See #14  Appreciate neuropsych eval 6. Skin/Wound Care: Routine pressure relief measures.  7. Fluids/Electrolytes/Nutrition: Monitor I/O.   BMP within acceptable range except for glucose on 3/8, labs ordered for Monday 8. Hypothyroid: Continue Synthroid 129mcg. 9. Epileptogenic potential: Conitnue Keppra 500 mg bid for seizure  No seizures from admission-3/12 10. Hypokalemia: Resolved 11. Macrocytic anemia:   Hemoglobin 10.0 on 3/12, labs ordered for Monday  Vitamin B12 within normal limits  Folate WNL  Hemoccult remains pending, discussed   Continue to monitor 12.  Hypoalbuminemia  Supplement initiated on 3/4 13.  Severe Oral Dysphagia and mild to moderate pharyngeal dysphagia:   D1 thins, advance diet as tolerated  Reviewed 3/15 SLP note: trialed D2 meal with thins, had prolonged AP transit and mild oral residue. Continue current diet.  14. Lethargy/decreased attention: Medications reviewed and he is not taking any overly sedating medications.   Sleep chart ordered, consider meds accordingly  3/14- pt sedated this AM- likely hangover from Lyrica 25 mg BID or trazodone 25 mg QHS or both- will stop both  3/15: Still overly sedated. DC melatonin and zyprexa.  15. Severe kyphosis: position with pillows when sleeping and sitting in chair,  placed nursing order.  16.  Labile blood pressure  Controlled on 3/12  3/16: Hypotensive. Not on any BP medications. Ritalin will possibly provide some boost to BP.  17. Insomnia  3/13- sleep chart showed didn't sleep well last night- will try trazodone 25 mg QHS at most-   3/14- will d/c trazodone- might benefit from melatonin, but will let Dr Posey Pronto address Monday  Avoid sleep agents at this time since they overly sedate patient. Wife states that when patient had Ritalin in past it improved his attention during the day and lessened his sundowning at night. Will trial low dose 5mg  today.  18. Hypokalemia  3/13- K+ 3.2- will replete 40 mEq x2 and recheck Monday.   3/14- labs scheduled for Monday.   3/15: 3.9, stable.  19. Disposition: Had  long discussion with patient and his wife yesterday afternoon. All sedating medication discontinued and will trial Ritalin again to stimulate during the day.   LOS: 13 days A FACE TO FACE EVALUATION WAS PERFORMED  Clide Deutscher Enid Maultsby 10/31/2019, 9:31 AM

## 2019-10-31 NOTE — Progress Notes (Signed)
Physical Therapy Session Note  Patient Details  Name: Eric Larsen MRN: [id number] Date of Birth: 1931/05/26  Today's Date: 10/31/2019 PT Individual Time: 3568-6168 PT Individual Time Calculation (min): 58 min   Short Term Goals: Week 1:  PT Short Term Goal 1 (Week 1): Will be able to perform bed mobility with ModAx1 PT Short Term Goal 1 - Progress (Week 1): Progressing toward goal PT Short Term Goal 2 (Week 1): Will be able to stand for 5-7 minutes with BUE support and ModA PT Short Term Goal 2 - Progress (Week 1): Met PT Short Term Goal 3 (Week 1): Will be able to transfer with LRAD and ModAx1 PT Short Term Goal 3 - Progress (Week 1): Progressing toward goal Week 2:  PT Short Term Goal 1 (Week 2): Will be able to perform bed mobility mod A PT Short Term Goal 2 (Week 2): Pt will perform bed<>chair transfer with LRAD mod A PT Short Term Goal 3 (Week 2): Pt will transfer sit<>stand with LRAD min A  Skilled Therapeutic Interventions/Progress Updates:   Received pt supine in bed, pt agreeable to therapy, and denied any pain during session. Session focused on functional mobility/transfers, eating and oral care, dynamic sitting balance, and improved endurance with activity. Pt transferred supine<>sitting EOB with max A for trunk control and LE management. While sitting EOB pt with strong posterior lean requiring max/total assist to prevent LOB posteriorly. While sitting EOB pt ate lunch with +2 assist. PT assisted to prevent posterior LOB while wife fed pt with total assist. Pt required increased time and max verbal cues for chewing, swallowing, and sucking to drink tea/orange juice. Pt pocketing food, requiring max cues and increased time to completely chew food. Pt transferred bed<>TIS WC total assist +2 via slideboard. Pt unable to follow commands for anterior weight shift, hand placement, and technique. Attempted LE strengthening exercises (hip/knee flexion) however, pt with decreased  initiation and unable to complete exercises. Concluded session with pt sitting in TIS WC, needs within reach, and seatbelt alarm on. Wife present at bedside assisting with oral care.   Therapy Documentation Precautions:  Precautions Precautions: Fall Precaution Comments: dementia, slow processing/initiation/poor attention Restrictions Weight Bearing Restrictions: No   Therapy/Group: Individual Therapy Alfonse Alpers PT, DPT   10/31/2019, 7:35 AM

## 2019-11-01 ENCOUNTER — Inpatient Hospital Stay (HOSPITAL_COMMUNITY): Payer: Medicare Other

## 2019-11-01 ENCOUNTER — Inpatient Hospital Stay (HOSPITAL_COMMUNITY): Payer: Medicare Other | Admitting: Occupational Therapy

## 2019-11-01 ENCOUNTER — Inpatient Hospital Stay (HOSPITAL_COMMUNITY): Payer: Medicare Other | Admitting: Speech Pathology

## 2019-11-01 ENCOUNTER — Inpatient Hospital Stay (HOSPITAL_COMMUNITY): Payer: Medicare Other | Admitting: Physical Therapy

## 2019-11-01 LAB — COMPREHENSIVE METABOLIC PANEL
ALT: 61 U/L — ABNORMAL HIGH (ref 0–44)
AST: 41 U/L (ref 15–41)
Albumin: 3.2 g/dL — ABNORMAL LOW (ref 3.5–5.0)
Alkaline Phosphatase: 70 U/L (ref 38–126)
Anion gap: 8 (ref 5–15)
BUN: 23 mg/dL (ref 8–23)
CO2: 29 mmol/L (ref 22–32)
Calcium: 9.5 mg/dL (ref 8.9–10.3)
Chloride: 99 mmol/L (ref 98–111)
Creatinine, Ser: 0.9 mg/dL (ref 0.61–1.24)
GFR calc Af Amer: >60 mL/min (ref >60)
GFR calc non Af Amer: >60 mL/min (ref >60)
Glucose, Bld: 135 mg/dL — ABNORMAL HIGH (ref 70–99)
Potassium: 3.7 mmol/L (ref 3.5–5.1)
Sodium: 136 mmol/L (ref 135–145)
Total Bilirubin: 3.1 mg/dL — ABNORMAL HIGH (ref 0.3–1.2)
Total Protein: 6.1 g/dL — ABNORMAL LOW (ref 6.5–8.1)

## 2019-11-01 LAB — BASIC METABOLIC PANEL
Anion gap: 10 (ref 5–15)
BUN: 22 mg/dL (ref 8–23)
CO2: 27 mmol/L (ref 22–32)
Calcium: 9.6 mg/dL (ref 8.9–10.3)
Chloride: 97 mmol/L — ABNORMAL LOW (ref 98–111)
Creatinine, Ser: 0.71 mg/dL (ref 0.61–1.24)
GFR calc Af Amer: >60 mL/min (ref >60)
GFR calc non Af Amer: >60 mL/min (ref >60)
Glucose, Bld: 126 mg/dL — ABNORMAL HIGH (ref 70–99)
Potassium: 3.7 mmol/L (ref 3.5–5.1)
Sodium: 134 mmol/L — ABNORMAL LOW (ref 135–145)

## 2019-11-01 LAB — OCCULT BLOOD X 1 CARD TO LAB, STOOL: Fecal Occult Bld: NEGATIVE

## 2019-11-01 LAB — CBC
HCT: 31 % — ABNORMAL LOW (ref 39.0–52.0)
HCT: 32.2 % — ABNORMAL LOW (ref 39.0–52.0)
Hemoglobin: 10.5 g/dL — ABNORMAL LOW (ref 13.0–17.0)
Hemoglobin: 10.8 g/dL — ABNORMAL LOW (ref 13.0–17.0)
MCH: 34.9 pg — ABNORMAL HIGH (ref 26.0–34.0)
MCH: 35 pg — ABNORMAL HIGH (ref 26.0–34.0)
MCHC: 33.5 g/dL (ref 30.0–36.0)
MCHC: 33.9 g/dL (ref 30.0–36.0)
MCV: 103 fL — ABNORMAL HIGH (ref 80.0–100.0)
MCV: 104.2 fL — ABNORMAL HIGH (ref 80.0–100.0)
Platelets: 285 10*3/uL (ref 150–400)
Platelets: 287 10*3/uL (ref 150–400)
RBC: 3.01 MIL/uL — ABNORMAL LOW (ref 4.22–5.81)
RBC: 3.09 MIL/uL — ABNORMAL LOW (ref 4.22–5.81)
RDW: 26.7 % — ABNORMAL HIGH (ref 11.5–15.5)
RDW: 26.9 % — ABNORMAL HIGH (ref 11.5–15.5)
WBC: 10.4 10*3/uL (ref 4.0–10.5)
WBC: 12.7 10*3/uL — ABNORMAL HIGH (ref 4.0–10.5)
nRBC: 0 % (ref 0.0–0.2)
nRBC: 0 % (ref 0.0–0.2)

## 2019-11-01 MED ORDER — SODIUM CHLORIDE 0.45 % IV SOLN
INTRAVENOUS | Status: DC
  Administered 2019-11-03: 1000 mL via INTRAVENOUS

## 2019-11-01 MED ORDER — LEVOTHYROXINE SODIUM 100 MCG/5ML IV SOLN
62.0000 ug | Freq: Every day | INTRAVENOUS | Status: DC
  Administered 2019-11-02 – 2019-11-06 (×5): 62 ug via INTRAVENOUS
  Filled 2019-11-01 (×6): qty 5

## 2019-11-01 MED ORDER — LEVOTHYROXINE SODIUM 100 MCG/5ML IV SOLN: 62.0000 ug | Freq: Every day | INTRAVENOUS | Status: DC

## 2019-11-01 NOTE — Progress Notes (Signed)
EEG completed, results pending. 

## 2019-11-01 NOTE — Plan of Care (Signed)
  Problem: Consults Goal: RH GENERAL PATIENT EDUCATION Description: See Patient Education module for education specifics. Outcome: Progressing   Problem: RH BOWEL ELIMINATION Goal: RH STG MANAGE BOWEL WITH ASSISTANCE Description: STG Manage Bowel with Assistance. Mod  Outcome: Progressing   Problem: RH BLADDER ELIMINATION Goal: RH STG MANAGE BLADDER WITH ASSISTANCE Description: STG Manage Bladder With Assistance. Mod Outcome: Progressing   Problem: RH SKIN INTEGRITY Goal: RH STG MAINTAIN SKIN INTEGRITY WITH ASSISTANCE Description: STG Maintain Skin Integrity With Assistance. Mod Outcome: Progressing   Problem: RH SAFETY Goal: RH STG ADHERE TO SAFETY PRECAUTIONS W/ASSISTANCE/DEVICE Description: STG Adhere to Safety Precautions With Assistance/Device. Mod Outcome: Progressing   Problem: RH PAIN MANAGEMENT Goal: RH STG PAIN MANAGED AT OR BELOW PT'S PAIN GOAL Description: Less than 3 Outcome: Progressing

## 2019-11-01 NOTE — Progress Notes (Addendum)
Physical Therapy Session Note  Patient Details  Name: Eric Larsen MRN: [id number] Date of Birth: [birth year]  Today's Date: 11/01/2019 PT Individual Time: 0902-0920 then 4270 to 0947 PT Individual Time Calculation (min): 18 min, then 8 min   Short Term Goals: Week 2:  PT Short Term Goal 1 (Week 2): Will be able to perform bed mobility mod A PT Short Term Goal 2 (Week 2): Pt will perform bed<>chair transfer with LRAD mod A PT Short Term Goal 3 (Week 2): Pt will transfer sit<>stand with LRAD min A  Skilled Therapeutic Interventions/Progress Updates:    Patient received in bed, very lethargic this morning but briefly opening eyes and interacting to simple questions or commands, although responses were not always on topic- "yes", "no", or "well he was doing/well he said...Marland KitchenMarland Kitchen" etc before trailing off. Session also very broken up due to MD/PA arriving to perform their assessments and provide care. Due to short sessions (had to return later in morning to get additional time) with multiple caregivers present, as well as lethargy this morning, really only able to work on stretching of heel cords, hamstrings, and hip adductors as well as performing heel slides, SLRs, and supine hip ABD with maximal cues for participation. Left in bed with all needs met, bed alarm active this morning.   Therapy Documentation Precautions:  Precautions Precautions: Fall Precaution Comments: dementia, slow processing/initiation/poor attention Restrictions Weight Bearing Restrictions: No  Pain Assessment Pain Scale: Faces Faces Pain Scale: No hurt    Therapy/Group: Individual Therapy   Windell Norfolk, DPT, PN1   Supplemental Physical Therapist [place]    Pager [phone] Acute Rehab Office [phone]    11/01/2019, 12:14 PM

## 2019-11-01 NOTE — Patient Care Conference (Signed)
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 Dementia associated with other underlying disease without behavioral disturbance ([place])   . Palliative care encounter   . Lethargy   . Acute blood loss anemia   . Hypoalbuminemia due to protein-calorie malnutrition ([place])   . Microcytic anemia   . Dysphagia   . Encephalitis 10/18/2019  . Abnormal EEG   . Hypothyroidism   . Macrocytic anemia   . 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 Hip fracture ([place]) 08/12/2017  . Hallux valgus of left foot 04/28/2017  . Hammer toe of left foot 04/28/2017  . Corn of toe 04/28/2017  . History of total left hip replacement 09/19/2015  . Spherocytosis (familial) ([place]) 09/17/2015  . Mixed hyperlipidemia 06/19/2015  . Benign prostatic hyperplasia with lower urinary tract symptoms 06/19/2015  . History of fracture of vertebra 11/06/2014  . History of colon polyps 08/08/2014  . Pathological fracture 11/01/2013  . Toenail fungus 01/20/2013  . Vitamin D deficiency 09/27/2012  . Primary hypothyroidism 09/27/2012  . Osteoporosis 09/27/2012  . Spherocytosis ([place]) 12/04/2011  . Skin cancer 12/04/2011    Expected Discharge Date: Expected Discharge Date: (May need SNF placement at [place])  Team Members Present: Physician leading conference: Dr. Leeroy Cha Care Coodinator Present: Karene Fry, RN, MSN;Deborah Tobin Chad, RN, BSN, CRRN Nurse Present: Vernie Murders, RN PT Present: Becky Sax, PT OT Present: [name], OT SLP  Present: Stormy Fabian, SLP PPS Coordinator present : Gunnar Fusi, Novella Olive, PT     Current Status/Progress Goal Weekly Team Focus  Bowel/Bladder   Pt is incontnent of B/B, LBM 10/26/19.  PRN Miralax given daily. Providers aware of no BM.  obtain continence of bowel and bladder, and have regular pattern of BM  Q2h toileting/ PRN   Swallow/Nutrition/ Hydration   Pt currently NPO d/t increased s/s of aspiration  Max A  pt with acute overt s/s of aspiration with ice chips, thin liquids via spoon, nectar and puree   ADL's   Max - total +2 for bathing/dressing, Max - total +2 squat pivot transfers, mod-max assist sitting balance.  3/11-3/12 pt requiring decreased assist mod assist +2 however pt requiring total assist +2 on 3/16  Min A - if pt does not consistently improve will need to downgrade goals  ADL retraining, activity tolerance, arousal, sitting balance/posutre, pt/family education   Mobility   bed mobility max A, bed<>chair transfers max A/total A +2, sit<>stand with RW mod A/max A +2, ambulation 51ft with RW +2 assist  min A  functional mobility/bed mobility, transfers, motor control/planning, dynamic standing balance, endurance   Communication             Safety/Cognition/ Behavioral Observations            Pain   Pt denies pain at this time. When pt is being rolled in the bed, pt facial expression indicates pain.  decrease pain during mobility and transfer  Assess pain  Qshift/PRN   Skin   Pt has MASD to the groin. No other obvious signs of skin breakdown or infection at this time.  maintain good skin integrity  assskin Qshift/PRN    Rehab Goals Patient on target to meet rehab goals: Yes *See Care Plan and progress notes for long and short-term goals.     Barriers to Discharge  Current Status/Progress Possible Resolutions Date Resolved   Nursing                  PT  Decreased caregiver support;Incontinence;Home environment access/layout  3 STE              OT                   SLP                SW Decreased caregiver support;Incontinence;Behavior;IV antibiotics Wife is sole support for patient. Children live out of state and litte financial resources available to hire help in the home Wife reports she can manage physical assistance however is concerned about mental state and cognitive issues          Discharge Planning/Teaching Needs:  Plan to go home with wife. ? SNF if needs more care than wife can provide; they would like to avoid SNF if at all possible  Toileting, transfers, medications, diet/restrictions, etc   Team Discussion: MD NPO, asp risk, EEG done, L arm shaking, trialed Ritalin, will call wife to discuss ?MBS/PEG/asp risk.  RN no BM since 3/11, needs enema, drowsy yesterday, coughing, has tremors, CT later today.  OT Friday amb mod +2, poor initiation, poor attention, yesterday tot +2 standing and transfers, fatigue yesterday, tot A self care tasks.  PT sit EOB yesterday max/total, wife tot A feed, slide board total +2, min A goals.  SLP NPO today, downgrade goals max A, global cognitive deficits, has malnutrition.  May need SNF, wife may not be able to manage at home.  MD to call wife.   Revisions to Treatment Plan: N/A     Medical Summary Current Status: Increased lethargy yesterday though with improved attention this morning, sundowning at night, high aspiration risk and may need MBS Weekly Focus/Goal: Trial of Ritalin, discussion with wife today regarding goals of care, possible MBS tomorrow pending this discussion, monitoring temperature/CBC  Barriers to Discharge: Medical stability   Possible Resolutions to Barriers: I will speak with wife today for goals of care/disposition discussion, EEG reviewed and shows improvement from prior, CT Head today   Continued Need for Acute Rehabilitation Level of Care: The patient requires daily medical management by a physician with specialized training in physical medicine and rehabilitation for  the following reasons: Direction of a multidisciplinary physical rehabilitation program to maximize functional independence : Yes Medical management of patient stability for increased activity during participation in an intensive rehabilitation regime.: Yes Analysis of laboratory values and/or radiology reports with any subsequent need for medication adjustment and/or medical intervention. : Yes   I attest that I was present, lead the team conference, and concur with the assessment and plan of the team.   Retta Diones 11/01/2019, 3:16 PM   Team conference was held via web/ teleconference due to [place] - 19

## 2019-11-01 NOTE — Progress Notes (Signed)
Physical Therapy Session Note  Patient Details  Name: Eric Larsen MRN: [id number] Date of Birth: 01-Dec-1930  Today's Date: 11/01/2019 PT Individual Time: 1410-3013 PT Individual Time Calculation (min): 44 min   Short Term Goals: Week 1:  PT Short Term Goal 1 (Week 1): Will be able to perform bed mobility with ModAx1 PT Short Term Goal 1 - Progress (Week 1): Progressing toward goal PT Short Term Goal 2 (Week 1): Will be able to stand for 5-7 minutes with BUE support and ModA PT Short Term Goal 2 - Progress (Week 1): Met PT Short Term Goal 3 (Week 1): Will be able to transfer with LRAD and ModAx1 PT Short Term Goal 3 - Progress (Week 1): Progressing toward goal Week 2:  PT Short Term Goal 1 (Week 2): Will be able to perform bed mobility mod A PT Short Term Goal 2 (Week 2): Pt will perform bed<>chair transfer with LRAD mod A PT Short Term Goal 3 (Week 2): Pt will transfer sit<>stand with LRAD min A  Skilled Therapeutic Interventions/Progress Updates:   Received pt supine in bed, pt agreeable to therapy, and denied any pain during session. Wife present at bedside. Session focused on functional mobility/transfers, LE strength, balance/coordination, and improved endurance with activity. Pt donned pants supine in bed with max A. Pt able to bridge hips up for therapist to pull pants over hips with max A. Pt transferred supine<>sitting EOB with CGA and increased time with max verbal cues for technique and sequencing. Pt transferred sit<>stand with RW mod A x 3 trials with increased time. Pt initially with strong posterior lean but able to shift weight anteriorly with increased time and cues. Pt performed standing hip flexion x10 on RLE and x 8 on L LE with bilateral UE support on RW and increased time and max verbal cues for technique and counting. Pt ambulated 66f with RW mod A [facility] increased time and max verbal cues for stepping sequence, RW safety, and technique.  Concluded session with pt semi-reclined in [facility] needs within reach, and seatbelt alarm on. Wife present at bedside.   Therapy Documentation Precautions:  Precautions Precautions: Fall Precaution Comments: dementia, slow processing/initiation/poor attention Restrictions Weight Bearing Restrictions: No  Therapy/Group: Individual Therapy AAlfonse AlpersPT, DPT   11/01/2019, 7:45 AM

## 2019-11-01 NOTE — Procedures (Signed)
Patient Name: Eric Larsen  MRN: [id number]  Epilepsy Attending: Lora Havens  Referring Physician/Provider: Reesa Chew, PA Date: 11/01/2019 Duration: 23.54 mins  Patient history:86 year old male with progressive confusion. MRI showed left temporal lobe is abnormal with increased signal on T2 and FLAIR and cortical thickening and medially and anteriorly. There is a small area of non masslike enhancement in the left anterior temporal lobe.EEG to evaluate for seizure.  Level of alertness:awake  AEDs during EEG study:LEV  Technical aspects: This EEG study was done with scalp electrodes positioned according to the 10-20 International system of electrode placement. Electrical activity was acquired at a sampling rate of 500Hz  and reviewed with a high frequency filter of 70Hz  and a low frequency filter of 1Hz . EEG data were recorded continuously and digitally stored.  DESCRIPTION: No clear posterior dominant rhythm was seen.EEG showed continuous generalized, maximal left temporal region, 3-6hz  theta-delta slowing. Hyperventilation and photic stimulation were not performed.  ABNORMALITY - Continuous slow, generalized and maximal left anterior temporal  IMPRESSION: This studyshowed evidence of cortical dysfunction in left anterior temporal region. Additionally, there is evidence of mild to moderate diffuse encephalopathy, non specific to etiology.No seizures were seen throughout the recording.  EEG appeared to be improving compared to previous study on 10/16/2019.  Elmus Mathes Barbra Sarks

## 2019-11-01 NOTE — Progress Notes (Addendum)
Speech Language Pathology Daily Session Note  Patient Details  Name: Eric Larsen MRN: [id number] Date of Birth: 1931/01/21  Today's Date: 11/01/2019 SLP Individual Time: 0720-0803 SLP Individual Time Calculation (min): 43 min  Short Term Goals: Week 2: SLP Short Term Goal 1 (Week 2): Pt will consume dysphagia 2 diet with Mod A cues for use of compensatory swallow strategies to clear oral residue. SLP Short Term Goal 2 (Week 2): Pt will focus attention to basic task for ~ 1 minute with Max A multimodal cues for redirection. SLP Short Term Goal 3 (Week 2): Pt will perform basic counting tasks to 10 with Max A cues. SLP Short Term Goal 4 (Week 2): Pt will read information on calendar to state date with Max A cues. SLP Short Term Goal 5 (Week 2): Pt will utilize speech intelligibility strategies at the phrase level to achieve 50% intelligibility with Max verbal cues.  Skilled Therapeutic Interventions:  Per chart, pt continued to demonstrate overt s/s of aspiration with nursing. Chest x-ray on 10/31/19 revealed new right mid lung field atelectasis versus developing infiltrate. Trials of ice chips, thin liquids via spoon, nectar thick liquids via spoon and puree were administered. Pt with new overt s/s of aspiration across all consistencies.  Pt orally held all boluses for > 10 seconds before initiating swallow, movement was visualized at neck suspect lingual pumping. Once prompted to swallow, he had either immediate and delayed throat clearing as well as immediate and delayed coughing. Pt's cough continues to very weak. Subjectively wetness could be heard posted swallow possibly indicative of pharyngeal residue d/t decreased pharyngeal parestalsis (per MBS 10/20/19). Pt didn't consume any of these trials without overt s/s of aspiration. Based on pt's MBS (10/20/19) these s/s could be indicative of premature spillage as well as delayed swallow initiation.  Pt is at a very high risk of aspirating d/t  mentation as well as dysphagia recommend short term alternate means of nutrition if that aligns with pt's GOC. At this time, I am not able to recommend a safe diet at bedside.   Additionally, pt's mentation appears more impaired as he continues to be fatigued and during this session, pt had visual hallucinations of animals in his room.   Information provided to pt's PA who will follow up with pt's wife as to her wishes for pt's [place].       Pain    Therapy/Group: Individual Therapy  Adriann Ballweg 11/01/2019, 9:35 AM

## 2019-11-01 NOTE — Progress Notes (Signed)
Tele-sitter notified staff that pt was undressing him self. Pt is alert, sitting up, making eye contact and answering questions appropriately. Pt tolerated crushed meds in apple sauce with no coughing.

## 2019-11-01 NOTE — Progress Notes (Signed)
Occupational Therapy Session Note  Patient Details  Name: Eric Larsen MRN: [id number] Date of Birth: 1930/10/01  Today's Date: 11/01/2019 OT Individual Time: 1350-1430 OT Individual Time Calculation (min): 40 min  and Today's Date: 11/01/2019 OT Missed Time: 20 Minutes Missed Time Reason: Patient unwilling/refused to participate without medical reason(agitation)   Short Term Goals: Week 2:  OT Short Term Goal 1 (Week 2): Pt will maintain static sitting balance with supervision for at least 3 mins in perparation for selfcare tasks. OT Short Term Goal 2 (Week 2): Pt will complete UB bathing with no more than min instructional cueing for initiation and sequencing. OT Short Term Goal 3 (Week 2): Pt will donn a pullover shirt with no more than min assist in supported sitting for two consecutive trials. OT Short Term Goal 4 (Week 2): Pt will complete toilet transfers with min assist for two consecutive trials OT Short Term Goal 5 (Week 2): Pt will complete LB dressing with mod assist at sit > stand level  Skilled Therapeutic Interventions/Progress Updates:    Treatment session with focus on functional mobility and activity tolerance.  Pt received upright in w/c engaging in conversation with wife.  Pt initially agreeable to therapy session therefore transported to Dayroom to engage in standing activity.  Upon arrival to Dayroom pt refusing any activity.  Pt stating that he was "too tired", "you people don't understand", and "I'm not doing anything this week".  Attempted to redirect pt and provided encouragement and rationale for therapy session.  Pt continuing to refuse, therefore redirected to another space on the rehab unit.  Wife located in hallway who provided encouragement and explanation of purpose of therapy session.  Pt again stating "you people don't understand" and various other responses, including language of confusion but pt refusing any standing or ambulation.  Pt also refusing to  return to room.  Once in room, pt initially calmer when provided with stuffed animal.  However when safety belt was placed on pt he became increasingly agitated, pulling on safety belt and removing it and attempted to stand from w/c.  Pt refusing to transfer to bed, but also refusing to wear safety belt.  Calm voice and redirection used throughout with pt still agitated.  RN and 2nd person arrived, pt able to be distracted enough to allow for safety belt to be placed and mits placed on hands to deter pt from removing safety belt.  Pt left upright in w/c with wife present to attempt to calm pt down.    Therapy Documentation Precautions:  Precautions Precautions: Fall Precaution Comments: dementia, slow processing/initiation/poor attention Restrictions Weight Bearing Restrictions: No General: General OT Amount of Missed Time: 20 Minutes Vital Signs: Therapy Vitals Temp: 98.7 F (37.1 C) Temp Source: Oral Pulse Rate: (!) 106 Resp: 19 BP: 115/69 Patient Position (if appropriate): Sitting Oxygen Therapy SpO2: 100 % O2 Device: Room Air Pain: Pain Assessment Pain Scale: Faces Faces Pain Scale: No hurt   Therapy/Group: Individual Therapy  [name] 11/01/2019, 3:24 PM

## 2019-11-01 NOTE — Progress Notes (Addendum)
Provider ordered to keep patient NPO (except meds) and notified wife to come by 0830 tomorrow for the MBS. Enema was administered earlier this afternoon. Patient had smear of brown-colored mucus and occult lab card collected. Patient's wife concerned about nutrition/dehydration since patient has been NPO today. IVF was ordered for patient.

## 2019-11-01 NOTE — Progress Notes (Signed)
[facility] PHYSICAL MEDICINE & REHABILITATION PROGRESS NOTE  Subjective/Complaints: Mental status continues to look improved.  Has a continuous tremor to left arm, EEG obtained and shows improvement since previous study on 3/1.  Afebrile, WBC decreased.   ROS: sedated, cannot due to cognition   Objective: Vital Signs: Blood pressure 120/70, pulse 80, temperature (!) 97.4 F (36.3 C), temperature source Oral, resp. rate 18, height 5\' 4"  (1.626 m), weight 50.8 kg, SpO2 100 %. DG Chest Port 1 View  Result Date: 10/31/2019 CLINICAL DATA:  86 year old male with cough and congestion. EXAM: PORTABLE CHEST 1 VIEW COMPARISON:  Chest radiograph dated 10/14/2019. FINDINGS: Right-sided PICC with tip over central SVC close to the cavoatrial junction. Focal bandlike density in the right mid lung field, new since the prior radiograph, likely atelectasis. Developing infiltrate is less likely but not excluded. Clinical correlation is recommended. No lobar consolidation, pleural effusion, pneumothorax. Stable cardiac silhouette. Atherosclerotic calcification of the aorta. The aorta is tortuous. Osteopenia with degenerative changes of the spine. Old healed left rib fractures. Partially visualized left humeral head screws. No acute osseous pathology. IMPRESSION: 1. Right mid lung field atelectasis versus developing infiltrate. Follow-up recommended. 2. Right-sided PICC with tip over central SVC. Electronically Signed   By: Anner Crete M.D.   On: 10/31/2019 19:51   Recent Labs    10/31/19 2349 11/01/19 0416  WBC 12.7* 10.4  HGB 10.5* 10.8*  HCT 31.0* 32.2*  PLT 285 287   Recent Labs    10/30/19 0340 10/31/19 2349  NA 136 136  K 3.9 3.7  CL 99 99  CO2 28 29  GLUCOSE 113* 135*  BUN 18 23  CREATININE 0.95 0.90  CALCIUM 9.8 9.5    Physical Exam: BP 120/70   Pulse 80   Temp (!) 97.4 F (36.3 C) (Oral)   Resp 18   Ht 5\' 4"  (1.626 m)   Wt 50.8 kg   SpO2 100%   BMI 19.21 kg/m   Constitutional: appears more alert today this morning HENT: kept eyes closed most of time Cardiovascular:no JVD; quiet heard sounds Respiratory: CTA B/L- a little coarse at bases, but good air movement GI: soft, NT, ND, (+) hypoactive BS Skin: Warm and dry.  Intact. Psych: sedated Musc: No edema in extremities.  No tenderness in extremities. Neurological: left arm with resting tremor.  Motor: Appears to be 4+/5 throughout, unchanged  Assessment/Plan: 1. 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  Physiatrist is providing close team supervision and 24 hour management of active medical problems listed below.  Physiatrist and rehab team continue to assess barriers to discharge/monitor patient progress toward functional and medical goals  Care Tool:  Bathing    Body parts bathed by patient: Right arm, Left arm, Chest, Abdomen, Face, Left upper leg, Right upper leg   Body parts bathed by helper: Left lower leg, Right lower leg, Front perineal area, Buttocks     Bathing assist Assist Level: 2 Helpers     Upper Body Dressing/Undressing Upper body dressing   What is the patient wearing?: Pull over shirt    Upper body assist Assist Level: 2 Helpers    Lower Body Dressing/Undressing Lower body dressing      What is the patient wearing?: Incontinence brief, Pants     Lower body assist Assist for lower body dressing: 2 Helpers     Toileting Toileting    Toileting assist  Assist for toileting: Dependent - Patient 0%     Transfers Chair/bed transfer  Transfers assist     Chair/bed transfer assist level: 2 Helpers     Locomotion Ambulation   Ambulation assist      Assist level: 2 helpers Assistive device: Walker-rolling Max distance: 15 ft   Walk 10 feet activity   Assist     Assist level: 2 helpers Assistive device: Walker-rolling   Walk  50 feet activity   Assist Walk 50 feet with 2 turns activity did not occur: Safety/medical concerns(fatigued)         Walk 150 feet activity   Assist Walk 150 feet activity did not occur: Safety/medical concerns(fatigued)         Walk 10 feet on uneven surface  activity   Assist Walk 10 feet on uneven surfaces activity did not occur: Safety/medical concerns(fatigued)         Wheelchair     Assist Will patient use wheelchair at discharge?: Yes Type of Wheelchair: Manual    Wheelchair assist level: Dependent - Patient 0%      Wheelchair 50 feet with 2 turns activity    Assist        Assist Level: Dependent - Patient 0%   Wheelchair 150 feet activity     Assist     Assist Level: Dependent - Patient 0%      Medical Problem List and Plan: 1.  Cognitive deficits with unintelligible language, weakness, secondary to herpes encephalitis.  Continue CIR             Continue IV acyclovir until 3/21  Team conference today.  2.  Antithrombotics: -DVT/anticoagulation:  Pharmaceutical: Lovenox             -antiplatelet therapy: NA 3. Pain Management: PRN medications. Lidocaine patch ordered for left shoulder. Pain better controlled.   3/13- pt appeared to keep pointing at legs like they hurt- will try Lyrica 25 mg BID- if has side effects, will stop-  3/14- too sedated this AM- will stop all med changes made.   4. Mood: Team support             -antipsychotic agents: NA 5. Neuropsych: This patient is not capable of making decisions on his own behalf.  Telesitter for safety  See #14  Appreciate neuropsych eval 6. Skin/Wound Care: Routine pressure relief measures.  7. Fluids/Electrolytes/Nutrition: Monitor I/O.   BMP within acceptable range except for glucose on 3/8, labs ordered for Monday 8. Hypothyroid: Continue Synthroid 170mcg. 9. Epileptogenic potential: Conitnue Keppra 500 mg bid for seizure  No seizures from admission-3/12 10. Hypokalemia:  Resolved 11. Macrocytic anemia:   Hemoglobin 10.0 on 3/12, labs ordered for Monday  Vitamin B12 within normal limits  Folate WNL  Hemoccult remains pending, discussed   Continue to monitor 12.  Hypoalbuminemia  Supplement initiated on 3/4 13.  Severe Oral Dysphagia and mild to moderate pharyngeal dysphagia:   D1 thins, advance diet as tolerated  Reviewed 3/15 SLP note: trialed D2 meal with thins, had prolonged AP transit and mild oral residue. Continue current diet.   3/17: Possible MBS tomorrow. Will discuss with [name] SLP after goals of care discussion with his wife tomorrow. CXR reviewed and is not suspicious for pneumonia, especially given afebrile and with reducing WBC. If suspicion for pneumonia arises, unasyn would be preferred broad spectrum antibiotic as does not lower seizure threshold.  14. Lethargy/decreased attention: Medications reviewed and he is not taking any overly sedating medications.  Sleep chart ordered, consider meds accordingly  3/14- pt sedated this AM- likely hangover from Lyrica 25 mg BID or trazodone 25 mg QHS or both- will stop both  3/15: Still overly sedated. DC melatonin and zyprexa.  15. Severe kyphosis: position with pillows when sleeping and sitting in chair, placed nursing order.  16.  Labile blood pressure  Controlled on 3/12  3/16: Hypotensive. Not on any BP medications. Ritalin will possibly provide some boost to BP.  17. Insomnia  3/13- sleep chart showed didn't sleep well last night- will try trazodone 25 mg QHS at most-   3/14- will d/c trazodone- might benefit from melatonin, but will let Dr Posey Pronto address Monday  Avoid sleep agents at this time since they overly sedate patient. Wife states that when patient had Ritalin in past it improved his attention during the day and lessened his sundowning at night. Will trial low dose 5mg  today.  18. Hypokalemia  3/13- K+ 3.2- will replete 40 mEq x2 and recheck Monday.   3/14- labs scheduled for Monday.    3/15: 3.9, stable.  19. Impaired cognition secondary to dementia and encephalitis: Head CT today. EEG performed today and shows improvement since 3/1 EEG.  20. Disposition: Had long discussion with patient and his wife yesterday afternoon. All sedating medication discontinued and will trial Ritalin again to stimulate during the day.    3/17: I will call wife to update on progress today. He is currently NPO and will need to discuss with her whether she would like to proceed with a modifed barium study tomorrow and whether she would want to consider PEG for him.   LOS: 14 days A FACE TO FACE EVALUATION WAS PERFORMED  Heavin Sebree P Tomeeka Plaugher 11/01/2019, 11:00 AM

## 2019-11-01 NOTE — Progress Notes (Signed)
Pt lethargic this morning, and had several incontinent episodes through out the night. Pt unable to assist staff with rolling in the bed. Pt denies being in pain at this time. Pt falls back to sleep immediately after asking the individual questions.

## 2019-11-02 ENCOUNTER — Inpatient Hospital Stay (HOSPITAL_COMMUNITY): Payer: Medicare Other | Admitting: Occupational Therapy

## 2019-11-02 ENCOUNTER — Encounter (HOSPITAL_COMMUNITY): Payer: Medicare Other | Admitting: Speech Pathology

## 2019-11-02 ENCOUNTER — Inpatient Hospital Stay (HOSPITAL_COMMUNITY): Payer: Medicare Other

## 2019-11-02 LAB — URINE CULTURE: Culture: NO GROWTH

## 2019-11-02 LAB — CBC WITH DIFFERENTIAL/PLATELET
Abs Immature Granulocytes: 0.11 10*3/uL — ABNORMAL HIGH (ref 0.00–0.07)
Basophils Absolute: 0 10*3/uL (ref 0.0–0.1)
Basophils Relative: 0 %
Eosinophils Absolute: 0 10*3/uL (ref 0.0–0.5)
Eosinophils Relative: 0 %
HCT: 30.9 % — ABNORMAL LOW (ref 39.0–52.0)
Hemoglobin: 10.5 g/dL — ABNORMAL LOW (ref 13.0–17.0)
Immature Granulocytes: 1 %
Lymphocytes Relative: 13 %
Lymphs Abs: 1.5 10*3/uL (ref 0.7–4.0)
MCH: 35.5 pg — ABNORMAL HIGH (ref 26.0–34.0)
MCHC: 34 g/dL (ref 30.0–36.0)
MCV: 104.4 fL — ABNORMAL HIGH (ref 80.0–100.0)
Monocytes Absolute: 1 10*3/uL (ref 0.1–1.0)
Monocytes Relative: 9 %
Neutro Abs: 8.9 10*3/uL — ABNORMAL HIGH (ref 1.7–7.7)
Neutrophils Relative %: 77 %
Platelets: 292 10*3/uL (ref 150–400)
RBC: 2.96 MIL/uL — ABNORMAL LOW (ref 4.22–5.81)
RDW: 27.4 % — ABNORMAL HIGH (ref 11.5–15.5)
WBC: 11.3 10*3/uL — ABNORMAL HIGH (ref 4.0–10.5)
nRBC: 0 % (ref 0.0–0.2)

## 2019-11-02 NOTE — Progress Notes (Signed)
Modified Barium Swallow Progress Note  Patient Details  Name: Eric Larsen MRN: [id number] Date of Birth: 12-28-1930  Today's Date: 11/02/2019  Modified Barium Swallow completed.  Full report located under Chart Review in the Imaging Section.  Brief recommendations include the following:  Clinical Impression  Pt presents with severe sensorimotor oropharyngeal dypshagia that is excerbated by pt's severe cognitive impairments. Pt has severely impaired oral phase that is c/b lengthy (>15 to 20 seconds) oral holding d/t weak lingual manipulation, lingual pumping, reduced posterior propulsion, holding of bolus, decreased bolus cohesion, delayed oral transit, premature spillage, piecemeal swallowing and oral residue requiring multiple swallow to clear boluses. Ptis pharyngeal phase is c/b delayed swallow initiation at the vallecula and pyriform sinuses, reduced pharyngeal peristalsis, that results in severe residue within the vallecula with silent aspiration of the honey thick liquids after the swallow and trace aspiration of puree. All aspiration was silent. Unfortunately pt is not able to follow directions, focus attention to task with Total A multimodal cues. At this time, I recommend that pt remain NPO. Should family wish for pt to consume PO intake with known risk of aspiration, honey thick by spoon and puree would be most appropriate.   Swallow Evaluation Recommendations       SLP Diet Recommendations: NPO       Medication Administration: Via alternative means               Oral Care Recommendations: Oral care QID        Eric Larsen 11/02/2019,3:30 PM

## 2019-11-02 NOTE — Progress Notes (Signed)
[facility] PHYSICAL MEDICINE & REHABILITATION PROGRESS NOTE  Subjective/Complaints: Went for MBS this morning. I will discuss results with SLP [name] and communicate results to his wife today. His wife would like to have a family meeting tomorrow with [name] children; will plan for 12pm as some live in [place].   ROS: sedated, cannot due to cognition   Objective: Vital Signs: Blood pressure (!) 160/87, pulse 87, temperature 98.1 F (36.7 C), temperature source Oral, resp. rate 16, height 5\' 4"  (1.626 m), weight 50.8 kg, SpO2 100 %. DG Abd 1 View  Result Date: 11/01/2019 CLINICAL DATA:  Constipation EXAM: ABDOMEN - 1 VIEW COMPARISON:  10/15/2019 FINDINGS: Nonobstructed bowel gas pattern with radiopaque contrast in the colon. Mild stool. Left hip replacement. Right femoral intramedullary rod. IMPRESSION: Nonobstructed bowel-gas pattern with contrast in the colon Electronically Signed   By: Donavan Foil M.D.   On: 11/01/2019 18:20   CT HEAD WO CONTRAST  Result Date: 11/01/2019 CLINICAL DATA:  Dysphagia.  Herpes encephalitis. EXAM: CT HEAD WITHOUT CONTRAST TECHNIQUE: Contiguous axial images were obtained from the base of the skull through the vertex without intravenous contrast. COMPARISON:  July 05, 2019. FINDINGS: Brain: No evidence of acute infarction, hemorrhage, hydrocephalus, extra-axial collection or mass lesion/mass effect. Atrophy and chronic microvascular ischemic changes are noted. Vascular: No hyperdense vessel or unexpected calcification. Skull: Normal. Negative for fracture or focal lesion. Sinuses/Orbits: There is a mucosal retention cyst within the left maxillary sinus. The remaining paranasal sinuses and mastoid air cells are essentially clear. Other: None. IMPRESSION: 1. No acute intracranial abnormality. 2. Chronic findings as detailed above. Electronically Signed   By: Constance Holster M.D.   On: 11/01/2019 16:54   DG Chest Port 1 View  Result Date:  10/31/2019 CLINICAL DATA:  86 year old male with cough and congestion. EXAM: PORTABLE CHEST 1 VIEW COMPARISON:  Chest radiograph dated 10/14/2019. FINDINGS: Right-sided PICC with tip over central SVC close to the cavoatrial junction. Focal bandlike density in the right mid lung field, new since the prior radiograph, likely atelectasis. Developing infiltrate is less likely but not excluded. Clinical correlation is recommended. No lobar consolidation, pleural effusion, pneumothorax. Stable cardiac silhouette. Atherosclerotic calcification of the aorta. The aorta is tortuous. Osteopenia with degenerative changes of the spine. Old healed left rib fractures. Partially visualized left humeral head screws. No acute osseous pathology. IMPRESSION: 1. Right mid lung field atelectasis versus developing infiltrate. Follow-up recommended. 2. Right-sided PICC with tip over central SVC. Electronically Signed   By: Anner Crete M.D.   On: 10/31/2019 19:51   EEG adult  Result Date: 11/01/2019 Lora Havens, MD     11/01/2019 11:02 AM Patient Name: Chesky Respress MRN: [id number] Epilepsy Attending: Lora Havens Referring Physician/Provider: Reesa Chew, PA Date: 11/01/2019 Duration: 23.54 mins Patient history:86 year old male with progressive confusion. MRI showed left temporal lobe is abnormal with increased signal on T2 and FLAIR and cortical thickening and medially and anteriorly. There is a small area of non masslike enhancement in the left anterior temporal lobe.EEG to evaluate for seizure.  Level of alertness:awake  AEDs during EEG study:LEV  Technical aspects: This EEG study was done with scalp electrodes positioned according to the 10-20 International system of electrode placement. Electrical activity was acquired at a sampling rate of 500Hz  and reviewed with a high frequency filter of 70Hz  and a low frequency filter of 1Hz . EEG data were recorded continuously and digitally stored.  DESCRIPTION: No  clear posterior dominant rhythm was seen.EEG showed  continuous generalized, maximal left temporal region, 3-6hz  theta-delta slowing. Hyperventilation and photic stimulation were not performed.  ABNORMALITY - Continuous slow, generalized and maximal left anterior temporal  IMPRESSION: This studyshowed evidence of cortical dysfunction in left anterior temporal region. Additionally, there is evidence of mild to moderate diffuse encephalopathy, non specific to etiology.No seizures were seen throughout the recording. EEG appeared to be improving compared to previous study on 10/16/2019.  Lora Havens   Recent Labs    11/01/19 0416 11/02/19 0418  WBC 10.4 11.3*  HGB 10.8* 10.5*  HCT 32.2* 30.9*  PLT 287 292   Recent Labs    10/31/19 2349 11/01/19 1436  NA 136 134*  K 3.7 3.7  CL 99 97*  CO2 29 27  GLUCOSE 135* 126*  BUN 23 22  CREATININE 0.90 0.71  CALCIUM 9.5 9.6    Physical Exam: BP (!) 160/87 (BP Location: Left Arm)   Pulse 87   Temp 98.1 F (36.7 C) (Oral)   Resp 16   Ht 5\' 4"  (1.626 m)   Wt 50.8 kg   SpO2 100%   BMI 19.21 kg/m  Constitutional: Frail, cachectic, confused HENT: kept eyes closed most of time Cardiovascular:no JVD; quiet heard sounds Respiratory: CTA B/L- a little coarse at bases, but good air movement GI: soft, NT, ND, (+) hypoactive BS Skin: Warm and dry.  Intact. Psych: confused, tangential speech Musc: No edema in extremities.  No tenderness in extremities. Neurological: left arm with resting tremor.  Motor: Appears to be 4+/5 throughout, unchanged  Assessment/Plan: 1. 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  Physiatrist is providing close team supervision and 24 hour management of active medical problems listed below.  Physiatrist and rehab team continue to assess barriers to discharge/monitor patient progress toward  functional and medical goals  Care Tool:  Bathing    Body parts bathed by patient: Right arm, Left arm, Chest, Abdomen, Face, Left upper leg, Right upper leg   Body parts bathed by helper: Left lower leg, Right lower leg, Front perineal area, Buttocks     Bathing assist Assist Level: 2 Helpers     Upper Body Dressing/Undressing Upper body dressing   What is the patient wearing?: Pull over shirt    Upper body assist Assist Level: 2 Helpers    Lower Body Dressing/Undressing Lower body dressing      What is the patient wearing?: Pants     Lower body assist Assist for lower body dressing: Total Assistance - Patient < 25%     Toileting Toileting    Toileting assist Assist for toileting: Dependent - Patient 0%     Transfers Chair/bed transfer  Transfers assist     Chair/bed transfer assist level: 2 Helpers     Locomotion Ambulation   Ambulation assist      Assist level: Moderate Assistance - Patient 50 - 74% Assistive device: Walker-rolling Max distance: 40ft   Walk 10 feet activity   Assist     Assist level: 2 helpers Assistive device: Walker-rolling   Walk 50 feet activity   Assist Walk 50 feet with 2 turns activity did not occur: Safety/medical concerns(fatigued)         Walk 150 feet activity   Assist Walk 150 feet activity did not occur: Safety/medical concerns(fatigued)         Walk 10 feet on uneven surface  activity   Assist Walk 10  feet on uneven surfaces activity did not occur: Safety/medical concerns(fatigued)         Wheelchair     Assist Will patient use wheelchair at discharge?: Yes Type of Wheelchair: Manual    Wheelchair assist level: Dependent - Patient 0%      Wheelchair 50 feet with 2 turns activity    Assist        Assist Level: Dependent - Patient 0%   Wheelchair 150 feet activity     Assist     Assist Level: Dependent - Patient 0%      Medical Problem List and Plan: 1.   Cognitive deficits with unintelligible language, weakness, secondary to herpes encephalitis.  Continue CIR             Continue IV acyclovir until 3/21, appreciate pharm consult.   Team conference yesterday; DC planned for 3/27. Based on current progress, likely to SNF at this point. Neoma Laming SW will provide wife with list of facilities.  2.  Antithrombotics: -DVT/anticoagulation:  Pharmaceutical: Lovenox             -antiplatelet therapy: NA 3. Pain Management: PRN medications. Lidocaine patch ordered for left shoulder. Pain better controlled.   3/13- pt appeared to keep pointing at legs like they hurt- will try Lyrica 25 mg BID- if has side effects, will stop-  3/14- too sedated this AM- will stop all med changes made.    3/18: attention has improved.  4. Mood: Team support             -antipsychotic agents: NA 5. Neuropsych: This patient is not capable of making decisions on his own behalf.  Telesitter for safety  See #14  Appreciate neuropsych eval 6. Skin/Wound Care: Routine pressure relief measures.  7. Fluids/Electrolytes/Nutrition: Monitor I/O.   BMP within acceptable range except for glucose on 3/8, labs ordered for Monday  3/18: High aspiration risk. Changed to NPO by SLP. MBS this morning. I will discuss results with wife once available. Wife has stated she prefers po intake to PEG despite aspiration risk.  8. Hypothyroid: Continue Synthroid 153mcg. 9. Epileptogenic potential: Conitnue Keppra 500 mg bid for seizure  No seizures from admission-3/12 10. Hypokalemia: Resolved 11. Macrocytic anemia:   Hemoglobin 10.0 on 3/12, labs ordered for Monday  Vitamin B12 within normal limits  Folate WNL  Hemoccult remains pending, discussed   Continue to monitor 12.  Hypoalbuminemia  Supplement initiated on 3/4 13.  Severe Oral Dysphagia and mild to moderate pharyngeal dysphagia:   D1 thins, advance diet as tolerated  Reviewed 3/15 SLP note: trialed D2 meal with thins, had prolonged  AP transit and mild oral residue. Continue current diet.   3/17: Possible MBS tomorrow. Will discuss with [name] SLP after goals of care discussion with his wife tomorrow. CXR reviewed and is not suspicious for pneumonia, especially given afebrile and with reducing WBC. If suspicion for pneumonia arises, unasyn would be preferred broad spectrum antibiotic as does not lower seizure threshold.   3/18: See #7 14. Lethargy/decreased attention: Medications reviewed and he is not taking any overly sedating medications.   Sleep chart ordered, consider meds accordingly  3/14- pt sedated this AM- likely hangover from Lyrica 25 mg BID or trazodone 25 mg QHS or both- will stop both  3/15: Still overly sedated. DC melatonin and zyprexa.  15. Severe kyphosis: position with pillows when sleeping and sitting in chair, placed nursing order.  16.  Labile blood pressure  Controlled on 3/12  3/16:  Hypotensive. Not on any BP medications. Ritalin will possibly provide some boost to BP.  17. Insomnia  3/13- sleep chart showed didn't sleep well last night- will try trazodone 25 mg QHS at most-   3/14- will d/c trazodone- might benefit from melatonin, but will let Dr Posey Pronto address Monday  Avoid sleep agents at this time since they overly sedate patient. Wife states that when patient had Ritalin in past it improved his attention during the day and lessened his sundowning at night. Will trial low dose 5mg  today.  18. Hypokalemia  3/13- K+ 3.2- will replete 40 mEq x2 and recheck Monday.   3/14- labs scheduled for Monday.   3/15: 3.9, stable.  19. Impaired cognition secondary to dementia and encephalitis: Head CT today. EEG performed today and shows improvement since 3/1 EEG.   3/18: Head CT reviewed and is stable.  20. Disposition: Had long discussion with patient and his wife yesterday afternoon. All sedating medication discontinued and will trial Ritalin again to stimulate during the day.    3/17: I will call wife to  update on progress today. He is currently NPO and will need to discuss with her whether she would like to proceed with a modifed barium study tomorrow and whether she would want to consider PEG for him.   LOS: 15 days A FACE TO FACE EVALUATION WAS PERFORMED  Girtha Kilgore P Ivelis Norgard 11/02/2019, 10:50 AM

## 2019-11-02 NOTE — Progress Notes (Signed)
Pharmacy Antimicrobial Note  Eric Larsen is a 86 y.o. male admitted on 10/18/2019 with altered mental status.  Pharmacy has been consulted for Acyclovir dosing for HSV 1 encephalitis.   SCr 0.71, CrCl ~ 47 ml/min, improved somewhat. Plan to treat for 3 weeks through 3/21.  Plan: Continue Acyclovir to 500mg  IV every 12 hours, last day 11/05/19 Monitor renal function (BMP Sat), LOT  Height: 5\' 4"  (162.6 cm) Weight: 111 lb 14.5 oz (50.8 kg) IBW/kg (Calculated) : 59.2  Temp (24hrs), Avg:98.1 F (36.7 C), Min:97.4 F (36.3 C), Max:98.7 F (37.1 C)  Recent Labs  Lab 10/28/19 0125 10/30/19 0340 10/31/19 2349 11/01/19 0416 11/01/19 1436 11/02/19 0418  WBC 6.9 10.3 12.7* 10.4  --  11.3*  CREATININE 0.88 0.95 0.90  --  0.71  --     Estimated Creatinine Clearance: 47.6 mL/min (by C-G formula based on SCr of 0.71 mg/dL).    No Known Allergies  Antimicrobials   Acyclovir 2/28>>(3/21) Cefepime 2/27 Vanc 2/27 MTZ 2/27   Microbiology 10/16/19 CSF + HSV1    Bertis Ruddy, PharmD Clinical Pharmacist Please check AMION for all [facility] numbers 11/02/2019 8:38 AM

## 2019-11-02 NOTE — Progress Notes (Signed)
Team Conference Report to Patient/Family  Team Conference discussion was reviewed with the patient and caregiver, including goals, any changes in plan of care and target discharge date.  Patient and caregiver express understanding and are concerned with the recommendation for SNF. Both understand that the patient may be physically too much for the wife to handle solo and are finacially unable to manage hired help and want to avoid a SNF if at all possible due to [place] , visiting restrictions, and previous bad experience at a reputable SNF. Wife given resources for private duty service agencies and a list of local SNF that she is to contact to get information about the facility she has for visiting, rehab services and COVID rates, etc and cost not covered by the paitent's insurance. She noted this is not at all what she had expected upon admission and was not prepared to make a decision yet. Family is coming in town and she will have a family discussion about her options and will get back with [place] on her decision and discharge destination. The MD did speak with the wife and reviewed medical status of the patient and recommendations for discharge based on amount of help the patient required and available assistance in the home at discharge. Dorien Chihuahua B 11/02/2019, 4:34 PM

## 2019-11-02 NOTE — Progress Notes (Signed)
Occupational Therapy Session Note  Patient Details  Name: Eric Larsen MRN: [id number] Date of Birth: 06-26-1931  Today's Date: 11/02/2019 OT Individual Time: 1530-1600 OT Individual Time Calculation (min): 30 min Missed 30 min  Short Term Goals: Week 1:  OT Short Term Goal 1 (Week 1): Pt will maintain static sitting balance with supervision for at least 3 mins in perparation for selfcare tasks. OT Short Term Goal 1 - Progress (Week 1): Progressing toward goal OT Short Term Goal 2 (Week 1): Pt will complete UB bathing with no more than min instructional cueing for initiation and sequencing. OT Short Term Goal 2 - Progress (Week 1): Progressing toward goal OT Short Term Goal 3 (Week 1): Pt will complete LB bathing sit to stand with max assist and use of AE PRN. OT Short Term Goal 3 - Progress (Week 1): Met OT Short Term Goal 4 (Week 1): Pt will complete toilet transfer with max assist using RW for support. OT Short Term Goal 4 - Progress (Week 1): Met OT Short Term Goal 5 (Week 1): Pt will donn a pullover shirt with no more than min assist in supported sitting for two consecutive trials. OT Short Term Goal 5 - Progress (Week 1): Progressing toward goal Week 2:  OT Short Term Goal 1 (Week 2): Pt will maintain static sitting balance with supervision for at least 3 mins in perparation for selfcare tasks. OT Short Term Goal 2 (Week 2): Pt will complete UB bathing with no more than min instructional cueing for initiation and sequencing. OT Short Term Goal 3 (Week 2): Pt will donn a pullover shirt with no more than min assist in supported sitting for two consecutive trials. OT Short Term Goal 4 (Week 2): Pt will complete toilet transfers with min assist for two consecutive trials OT Short Term Goal 5 (Week 2): Pt will complete LB dressing with mod assist at sit > stand level Week 3:     Skilled Therapeutic Interventions/Progress Updates:    1:1 Pt was up in w/c. Had attempted to see pt  eariler but pt had fallen asleep after having a rough night last night with little sleep. Decided with pt's wife to allow pt to sleep and would come back later. Pt was up in the w/c when OT came back. Pt agreeable to attempted toileting. PT taken into the bathroom via w/c. Pt able to transfer with the grab bar with min A with more than reasonable amt of time. Pt took more than reasonable amt of time to decend to the toilet. Total A for clothing management. With bilateral Ue support on grab bar pt able to transfer back into w/c with min A with extra time. Pt stood at the sink with min A to wash his hands with extra time and mod multimodal cues for sequencing. In seated position pt washed face and applied shaving cream with extra time and cues for sustained attention. Ot completed shaving task. Pt was noted perseveration with brushing his hair requiring cues to terminate task. Pt left resting in w/c with wife present.   Therapy Documentation Precautions:  Precautions Precautions: Fall Precaution Comments: dementia, slow processing/initiation/poor attention Restrictions Weight Bearing Restrictions: No General: General OT Amount of Missed Time: 30 Minutes Vital Signs: Therapy Vitals Temp: 98 F (36.7 C) Pulse Rate: 88 Resp: 16 BP: 124/70 Patient Position (if appropriate): Sitting Oxygen Therapy SpO2: 99 % O2 Device: Room Air Pain:  no c/o pain; RN came in during session to apply lidocaine patch  to left shoulder   Therapy/Group: Individual Therapy  [name] [facility] 11/02/2019, 7:03 PM

## 2019-11-02 NOTE — Progress Notes (Signed)
Physical Therapy Weekly Progress Note  Patient Details  Name: Eric Larsen MRN: [id number] Date of Birth: August 20, 1930  Beginning of progress report period: October 19, 2019 End of progress report period: November 02, 2019  Today's Date: 11/02/2019 PT Individual Time: 4196-2229 and 1500-1525  PT Individual Time Calculation (min): 40 min and 25 min  Patient has met 1 of 3 short term goals. Pt continues to fluctuate daily with mobility status. Pt is able to perform bed mobility with as little assist as CGA to as much as max/total A. Pt is able to transfer bed<>WC with/without RW with min A/total A +2 depending on the day, is able to ambulate up to 75f with RW min A +2 for safety with max cues for stepping sequence, RW safety, and technique, and navigate 4 steps with 2 rails mod A +2 for safety with significantly increased time. Pt continues to be limited by dysarthria, decreased initiation with movement, difficulty following commands, poor motor control/planning, and decreased safety awareness. Discussed with wife the amount of care pt will require and wife verbalized understanding that she will not be able to care for him herself.   Patient continues to demonstrate the following deficits muscle weakness, impaired timing and sequencing, unbalanced muscle activation, decreased coordination and decreased motor planning and decreased sitting balance, decreased standing balance, decreased postural control and decreased balance strategies and therefore will continue to benefit from skilled PT intervention to increase functional independence with mobility.  Patient progressing toward long term goals..  Continue plan of care.  PT Short Term Goals Week 2:  PT Short Term Goal 1 (Week 2): Will be able to perform bed mobility mod A PT Short Term Goal 1 - Progress (Week 2): Progressing toward goal PT Short Term Goal 2 (Week 2): Pt will perform bed<>chair transfer with LRAD mod A PT Short Term Goal 2 - Progress  (Week 2): Progressing toward goal PT Short Term Goal 3 (Week 2): Pt will transfer sit<>stand with LRAD min A PT Short Term Goal 3 - Progress (Week 2): Met Week 3:  PT Short Term Goal 1 (Week 3): Will be able to perform bed mobility consistantly with mod A PT Short Term Goal 2 (Week 3): Pt will perform bed<>chair transfer with LRAD consistantly with mod A PT Short Term Goal 3 (Week 3): Pt will perform car transfer with LRAD mod A  Skilled Therapeutic Interventions/Progress Updates:  Ambulation/gait training;DME/adaptive equipment instruction;Neuromuscular re-education;Stair training;UE/LE Strength taining/ROM;Discharge planning;Skin care/wound management;Therapeutic Activities;UE/LE Coordination activities;Cognitive remediation/compensation;Disease management/prevention;Functional mobility training;Patient/family education;Splinting/orthotics;Therapeutic Exercise;Visual/perceptual remediation/compensation;Balance/vestibular training   Today's Interventions: Treatment Session 1: 1115-1155 40 min Received pt supine in bed asleep. Pt unable to be woken with tactile stimuli however woken upon sitting EOB. Pt agreeable to therapy, and denied any pain during session. Session focused on functional mobility/transfers, LE strength, dynamic standing balance/coordination, ambulation, and improved activity tolerance. Pt transferred supine<>sitting EOB with max A. Pt initially with strong posterior lean requiring max A to correct however with increased time and cues to reach forward and hold onto RW, pt able to shift weight anteriorly and maintain static sitting balance with CGA for approximately 3 minutes. Pt transferred sit<>stand with RW mod A and ambulated 132fwith RW mod A +2 for equipment with increased time and max verbal cues for stepping sequence and RW safety. Noted pt with soiled pants. Pt transferred stand<>pivot WC<>bed via handheld assist mod A +2 for safety and sit<>supine mod A. Pt rolled to L and R  with max A for therapist  to doff soiled brief and pants. Pt required total assist for peri-care and to don clean incontinence brief. Concluded session with pt supine in bed, needs within reach, and bed alarm on. Wife present at bedside.   Treatment Session 2: 3235-5732 25 min Received pt supine in bed, pt agreeable to therapy, and denied any pain during session. Session focused on functional mobility/transfers, LE strength, dynamic standing balance/coordination, and improved activity tolerance. Pt with soiled brief. Pt rolled to L and R with mod A and use of bedrails. Therapist doffed soiled incontinence brief, performed peri-care, and donned clean incontinence brief with max A +2 to maintain pt in position for changing. Donned pants supine in bed with mod A. Pt transferred supine<>sitting EOB with min A and increased time. Pt transferred stand<>pivot bed<>WC via handheld assist mod A+2 for safety. Pt required max verbal cues for stepping sequence and turning technique. Concluded session with pt in [facility], needs within reach, and seatbelt alarm on waiting for OT session. Wife present at bedside.   Therapy Documentation Precautions:  Precautions Precautions: Fall Precaution Comments: dementia, slow processing/initiation/poor attention Restrictions Weight Bearing Restrictions: No  Therapy/Group: Individual Therapy Alfonse Alpers PT, DPT   11/02/2019, 7:33 AM

## 2019-11-02 NOTE — Progress Notes (Signed)
Pt very agitated and anxious. Pt is aggressive with staff members when providing personal care (grabbing and hitting). Pt continues to take clothes and diaper off, throwing it in the floor. Pt denies pain at this time.

## 2019-11-02 NOTE — Progress Notes (Signed)
Had conference with wife and [name]. 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 I had addition discussion with wife about placing cortak, PEG tube v/s letting patient eat with known aspiration risk which could lead to infection and demise as antibiotics could not be Rx indefinitely.  She did not want cortak or PEG due to concerns of patient pulling them out--we discussed restraints as well as medications to decrease agitation. She is not sure of his wishes --they have not had this conversation and she cannot find his living will. She would like a conference with ALL family members her and his children to help make decision. Dr. Adam Phenix has agreed to this and wife notified of time so she can relay this to multiple family members.

## 2019-11-02 NOTE — Progress Notes (Signed)
Occupational Therapy Session Note  Patient Details  Name: Eric Larsen MRN: [id number] Date of Birth: [birth year]  Today's Date: 11/02/2019 OT Individual Time: [id number] OT Individual Time Calculation (min): 25 min  and Today's Date: 11/02/2019 OT Missed Time: 20 Minutes Missed Time Reason: Patient fatigue   Short Term Goals: Week 2:  OT Short Term Goal 1 (Week 2): Pt will maintain static sitting balance with supervision for at least 3 mins in perparation for selfcare tasks. OT Short Term Goal 2 (Week 2): Pt will complete UB bathing with no more than min instructional cueing for initiation and sequencing. OT Short Term Goal 3 (Week 2): Pt will donn a pullover shirt with no more than min assist in supported sitting for two consecutive trials. OT Short Term Goal 4 (Week 2): Pt will complete toilet transfers with min assist for two consecutive trials OT Short Term Goal 5 (Week 2): Pt will complete LB dressing with mod assist at sit > stand level  Skilled Therapeutic Interventions/Progress Updates:   Upon entering the room, pt supine in bed and very confused with tangential speech this session. Pt denies pain this session. RN reports pt was up all night and has not slept yet. Pt  Performs bed mobility to EOB with min A. Pt needing min - mod A for sitting balance. OT assisted pt with threading pants over B feet. Pt pulling pants up to thighs and then returning to supine with mod A for B LEs. Pt rolling L <> R with min A and therapist pulling pants over B hips. Pt then appears very lethargic and having difficulty remaining awake. OT repositioned pt, lowered bed, placed mats on the floor and allowed pt to rest.   Therapy Documentation Precautions:  Precautions Precautions: Fall Precaution Comments: dementia, slow processing/initiation/poor attention Restrictions Weight Bearing Restrictions: No General: General OT Amount of Missed Time: 20 Minutes Vital Signs: Therapy Vitals Temp: 98.1 F  (36.7 C) Temp Source: Oral Pulse Rate: 87 Resp: 16 BP: (!) 160/87 Patient Position (if appropriate): Lying Oxygen Therapy SpO2: 100 % O2 Device: Room Air ADL: ADL Grooming: Maximal assistance Where Assessed-Grooming: Edge of bed Upper Body Bathing: Maximal assistance Where Assessed-Upper Body Bathing: Edge of bed Lower Body Bathing: Other (comment)(total +2) Where Assessed-Lower Body Bathing: Edge of bed Upper Body Dressing: Maximal assistance Where Assessed-Upper Body Dressing: Edge of bed Lower Body Dressing: Other (Comment)(total +2) Toileting: Other (Comment)(total +2) Toilet Transfer: Other (comment)(total +2) Toilet Transfer Equipment: Bedside commode   Therapy/Group: Individual Therapy  Gypsy Decant 11/02/2019, 8:34 AM

## 2019-11-03 ENCOUNTER — Inpatient Hospital Stay (HOSPITAL_COMMUNITY): Payer: Medicare Other

## 2019-11-03 ENCOUNTER — Inpatient Hospital Stay (HOSPITAL_COMMUNITY): Payer: Medicare Other | Admitting: Occupational Therapy

## 2019-11-03 ENCOUNTER — Inpatient Hospital Stay (HOSPITAL_COMMUNITY): Payer: Medicare Other | Admitting: Speech Pathology

## 2019-11-03 MED ORDER — DIAZEPAM 2 MG PO TABS
10.0000 mg | ORAL_TABLET | Freq: Once | ORAL | Status: AC | PRN
  Administered 2019-11-03: 10 mg via ORAL
  Filled 2019-11-03: qty 5

## 2019-11-03 MED ORDER — METHYLPHENIDATE HCL 5 MG PO TABS
5.0000 mg | ORAL_TABLET | Freq: Every day | ORAL | Status: DC
  Administered 2019-11-04 – 2019-11-07 (×4): 5 mg via ORAL
  Filled 2019-11-03 (×6): qty 1

## 2019-11-03 MED ORDER — LEVETIRACETAM 500 MG PO TABS: 500.0000 mg | ORAL_TABLET | Freq: Two times a day (BID) | ORAL | Status: DC

## 2019-11-03 MED ORDER — LEVETIRACETAM IN NACL 500 MG/100ML IV SOLN
500.0000 mg | Freq: Two times a day (BID) | INTRAVENOUS | Status: DC
  Administered 2019-11-03 – 2019-11-06 (×7): 500 mg via INTRAVENOUS
  Filled 2019-11-03 (×8): qty 100

## 2019-11-03 MED ORDER — FOLIC ACID 5 MG/ML IJ SOLN
1.0000 mg | Freq: Every day | INTRAMUSCULAR | Status: DC
  Administered 2019-11-04 – 2019-11-06 (×3): 1 mg via INTRAVENOUS
  Filled 2019-11-03 (×5): qty 0.2

## 2019-11-03 NOTE — Progress Notes (Signed)
Physical Therapy Session Note  Patient Details  Name: Eric Larsen MRN: [id number] Date of Birth: [birth year]  Today's Date: 11/03/2019 PT Individual Time: 3128-1188 PT Individual Time Calculation (min): 72 min   Short Term Goals: Week 2:  PT Short Term Goal 1 (Week 2): Will be able to perform bed mobility mod A PT Short Term Goal 1 - Progress (Week 2): Progressing toward goal PT Short Term Goal 2 (Week 2): Pt will perform bed<>chair transfer with LRAD mod A PT Short Term Goal 2 - Progress (Week 2): Progressing toward goal PT Short Term Goal 3 (Week 2): Pt will transfer sit<>stand with LRAD min A PT Short Term Goal 3 - Progress (Week 2): Met Week 3:  PT Short Term Goal 1 (Week 3): Will be able to perform bed mobility consistantly with mod A PT Short Term Goal 2 (Week 3): Pt will perform bed<>chair transfer with LRAD consistantly with mod A PT Short Term Goal 3 (Week 3): Pt will perform car transfer with LRAD mod A  Skilled Therapeutic Interventions/Progress Updates:   Received pt sitting in WC, pt agreeable to therapy, and denied any pain during session. Wife present at bedside. Session focused on functional mobility/transfers, LE strength, dynamic standing balance/coordination, ambulation, attention/awareness, and improved endurance with activity. Pt transported to gym in [facility] total assist for time management purposes.  Pt transferred WC<>mat stand<>pivot with RW min A with increased time. Pt able to maintain static sitting balance today with close supervision. Pt performed standing horseshoe toss with RW x 2 trials CGA with max verbal cues for technique and increased time. Pt ambulated 40f x 2 trials with RW min A +2 for equipment and safety. Pt required mod verbal cues for stepping sequence, technique, RW safety, with increased time. Pt performed standing alternating toe taps to 3 in step with bilateral UE support on RW 2x15 with min A/CGA. Worked on identifying colors of cones. Pt able to  identify yellow cone but only able to identify purple and blue cones when given two options. Pt performed standing toe taps to colored cones. Pt able to correctly identify color 7/7 trials. Pt transferred stand<>pivot mat<>WC with RW min A. [facility]. Concluded session with pt sitting in WC, needs within reach, and seatbelt alarm on. Wife present at bedside.   Therapy Documentation Precautions:  Precautions Precautions: Fall Precaution Comments: dementia, slow processing/initiation/poor attention Restrictions Weight Bearing Restrictions: No  Therapy/Group: Individual Therapy AAlfonse AlpersPT, DPT   11/03/2019, 7:34 AM

## 2019-11-03 NOTE — Progress Notes (Signed)
Night shift nurse informed writer that this patient slept very little last night. He was pulling on his picc line and trying to crawl out of bed. His wife was called and she came back for about 3 hours. Shortly after she left he started pulling at his line and trying to get out of bed again. This behavior continued throughout the night. This morning the patient was incapable of participating with the staff due to his extreme fatigue. Staff has attempted to keep the patient engaged today so he can sleep tonight.

## 2019-11-03 NOTE — Progress Notes (Signed)
Occupational Therapy Session Note  Patient Details  Name: Eric Larsen MRN: [id number] Date of Birth: Jan 29, 1931  Today's Date: 11/03/2019 OT Individual Time: 7902-4097 OT Individual Time Calculation (min): 75 min    Short Term Goals: Week 1:  OT Short Term Goal 1 (Week 1): Pt will maintain static sitting balance with supervision for at least 3 mins in perparation for selfcare tasks. OT Short Term Goal 1 - Progress (Week 1): Progressing toward goal OT Short Term Goal 2 (Week 1): Pt will complete UB bathing with no more than min instructional cueing for initiation and sequencing. OT Short Term Goal 2 - Progress (Week 1): Progressing toward goal OT Short Term Goal 3 (Week 1): Pt will complete LB bathing sit to stand with max assist and use of AE PRN. OT Short Term Goal 3 - Progress (Week 1): Met OT Short Term Goal 4 (Week 1): Pt will complete toilet transfer with max assist using RW for support. OT Short Term Goal 4 - Progress (Week 1): Met OT Short Term Goal 5 (Week 1): Pt will donn a pullover shirt with no more than min assist in supported sitting for two consecutive trials. OT Short Term Goal 5 - Progress (Week 1): Progressing toward goal Week 2:  OT Short Term Goal 1 (Week 2): Pt will maintain static sitting balance with supervision for at least 3 mins in perparation for selfcare tasks. OT Short Term Goal 2 (Week 2): Pt will complete UB bathing with no more than min instructional cueing for initiation and sequencing. OT Short Term Goal 3 (Week 2): Pt will donn a pullover shirt with no more than min assist in supported sitting for two consecutive trials. OT Short Term Goal 4 (Week 2): Pt will complete toilet transfers with min assist for two consecutive trials OT Short Term Goal 5 (Week 2): Pt will complete LB dressing with mod assist at sit > stand level  Skilled Therapeutic Interventions/Progress Updates:    1:1 Pt in bed awake when arrived. Pt  Got to EOB with min A with more than  reasonable amt of time. Pt transferred into regular w/c with contoured back with min guard with again more time and max cues for sequencing and hand placement. Transported to the bathroom. Pt performed transfer to the toilet again with min guard with use of grab bar again with more than reasonable amt of time and max cues for sequencing steps. Pt required increased time to descend to the toilet. Did not void but did get toilet paper and perform front hygiene. Pt's brief was soiled - A with thorough hygiene while in standing. Transferred back to w/c with min A. Bathed and dressed at the sink. Bathed UB with setup and mod cues. A for LB including feet. Pt able to don shirt with setup of shirt with min A again with extra time. Pt assisted with threading pants/ attending to the task but did require A to thread effectively. Pt performed sit to stand from w/c to standing at the sink with min guard. Pt able to let go of sink and pull up pants with extra time with min A for balance. Pt ambulated ~ 30 feet in the hallway with RW with min A with extra time and cues to widen base of support.   Trial of sitting in regular w/c with couture back for at least 1 hr 15 min to see if could tolerate a regular chair v tilt in space.   Left resting in the w/c  with communication with nursing on new trial of chair.   Therapy Documentation Precautions:  Precautions Precautions: Fall Precaution Comments: dementia, slow processing/initiation/poor attention Restrictions Weight Bearing Restrictions: No Pain:  no c/o pain in session    Therapy/Group: Individual Therapy  [name] 11/03/2019, 12:05 PM

## 2019-11-03 NOTE — Consult Note (Addendum)
NEURO HOSPITALIST CONSULT NOTE   Requesting physician: Dr. Posey Pronto   Reason for Consult:worsening dysphagia   History obtained from: wife/chart HPI:                                                                                                                                          Jonathyn Bacus is an 86 y.o. male  With PMH osteoporosis, gait disorder, recent dx dementia who was admitted on 10/14/19 with intermittent confusion and progressive weakness.  Per wife patient swallowing has gotten worse. It had gotten better for awhile, but now is not improving.  Of note: Results of MBS were given yesterday and she was presented with option of PEG or cortrak.  One of the therapist stated that this is the most awake, and alert that he has been in days. Also a few days ago patient had some visual hallucinations. Speech is not garbled they can understand what he is saying, but it does not make any sense in the context of the conversation.   Per chart review: Wife reported a 6 month history of memory problems and weight loss. MRI at that time showed left anterior medial temporal lobe thickening and T2 hyperintensity gyri with edema and small nonmasslike enhancement left anterior temporal lobe. Findings consistent with herpes encephalitis. Started on  14 days of acyclovir. EEG showed This study showed epileptogenicity and cortical dysfunction in left anterior temporal region. No seizures were seen. Patient was started on Keppra. Patient was admitted to [facility].  Swallow 3/18: dysphagia; recommended NPO; PEG or NGT which wife refused.  Honey thick consistency recommended.  3/17 EEG This studyshowed evidence of cortical dysfunction in left anterior temporal region. Additionally, there is evidence of mild to moderate diffuse encephalopathy, non specific to etiology.No seizures were seen throughout the recording. CTH: no acute finding  EEG appeared to be improving compared to  previous study on 10/16/2019. Past Medical History:  Diagnosis Date  . Hypothyroidism   . Osteoporosis   . Thyroid disease     Past Surgical History:  Procedure Laterality Date  . CHOLECYSTECTOMY    . HIP FRACTURE SURGERY    . INTRAMEDULLARY (IM) NAIL INTERTROCHANTERIC Right 08/13/2017   Procedure: INTRAMEDULLARY (IM) NAIL INTERTROCHANTRIC;  Surgeon: Earnestine Leys, MD;  Location: [facility] ORS;  Service: Orthopedics;  Laterality: Right;  . SHOULDER SURGERY      Family History  Problem Relation Age of Onset  . Hereditary spherocytosis Father   . Hereditary spherocytosis Sister         Social History:  reports that he has never smoked. He has never used smokeless tobacco. He reports current alcohol use. He reports that he does not use drugs.  No Known Allergies  MEDICATIONS:  Scheduled: . Chlorhexidine Gluconate Cloth  6 each Topical Daily  . enoxaparin (LOVENOX) injection  40 mg Subcutaneous Q24H  . fluticasone  1 spray Each Nare Daily  . folic acid  1 mg Intravenous Daily  . levothyroxine  62 mcg Intravenous Daily  . lidocaine  1 patch Transdermal Q24H  . [START ON 11/04/2019] methylphenidate  5 mg Oral Daily  . sodium chloride flush  10-40 mL Intracatheter Q12H   Continuous: . sodium chloride 75 mL/hr at 11/03/19 0203  . acyclovir 500 mg (11/03/19 0844)  . levETIRAcetam 500 mg (11/03/19 1019)   [id number], [DISCONTINUED] prochlorperazine **OR** prochlorperazine **OR** prochlorperazine, sodium chloride flush   ROS:                                                                                                                                        unobtainable from patient due to mental status   Blood pressure 124/68, pulse 89, temperature 97.7 F (36.5 C), temperature source Oral, resp. rate 18, height 5\' 4"  (1.626 m), weight 50.8 kg, SpO2 98  %.   General Examination:                                                                                                       Physical Exam  Constitutional: Appears well-developed and well-nourished.  Psych: Affect appropriate to situation Eyes: Normal external eye and conjunctiva. HENT: Normocephalic, no lesions, without obvious abnormality.   Musculoskeletal-no joint tenderness, deformity or swelling Cardiovascular: Normal rate and regular rhythm.  Respiratory: Effort normal, non-labored breathing saturations WNL GI: Soft.  No distension. There is no tenderness.  Skin: WDI  Neurological Examination Mental Status: Alert, oriented to name only ( which wife states even that depends on the day). Able to follow simple commands. Cranial Nerves: blinks to threat bilaterally, hearing appears normal, EOEMI, no nystagmus. Face is symmetric. Hearing intact to voice. Tongue protrudes midline. Motor: Able to lift all 4 extremities anti-gravity with 4/5 strength. Cog wheel rigidity noted. sternocleidomastoid muscle 4/5 strength bilaterally Sensory:  light touch intact throughout, bilaterally Deep Tendon Reflexes: 2+ and symmetric biceps and patella Plantars: Right: downgoing   Left: downgoing Cerebellar: No ataxia noted Gait: deferred   Lab Results: Basic Metabolic Panel: Recent Labs  Lab 10/28/19 0125 10/28/19 0125 10/30/19 0340 10/31/19 2349 11/01/19 1436  NA 134*  --  136 136 134*  K 3.2*  --  3.9 3.7 3.7  CL 96*  --  99 99 97*  CO2  30  --  28 29 27   GLUCOSE 117*  --  113* 135* 126*  BUN 21  --  18 23 22   CREATININE 0.88  --  0.95 0.90 0.71  CALCIUM 9.6   < > 9.8 9.5 9.6   < > = values in this interval not displayed.    CBC: Recent Labs  Lab 10/28/19 0125 10/30/19 0340 10/31/19 2349 11/01/19 0416 11/02/19 0418  WBC 6.9 10.3 12.7* 10.4 11.3*  NEUTROABS  --  7.9*  --   --  8.9*  HGB 10.1* 11.2* 10.5* 10.8* 10.5*  HCT 29.1* 32.4* 31.0* 32.2* 30.9*  MCV 99.0  100.9* 103.0* 104.2* 104.4*  PLT 297 329 285 287 292    Imaging: DG Abd 1 View  Result Date: 11/01/2019 CLINICAL DATA:  Constipation EXAM: ABDOMEN - 1 VIEW COMPARISON:  10/15/2019 FINDINGS: Nonobstructed bowel gas pattern with radiopaque contrast in the colon. Mild stool. Left hip replacement. Right femoral intramedullary rod. IMPRESSION: Nonobstructed bowel-gas pattern with contrast in the colon Electronically Signed   By: Donavan Foil M.D.   On: 11/01/2019 18:20   CT HEAD WO CONTRAST  Result Date: 11/01/2019 CLINICAL DATA:  Dysphagia.  Herpes encephalitis. EXAM: CT HEAD WITHOUT CONTRAST TECHNIQUE: Contiguous axial images were obtained from the base of the skull through the vertex without intravenous contrast. COMPARISON:  July 05, 2019. FINDINGS: Brain: No evidence of acute infarction, hemorrhage, hydrocephalus, extra-axial collection or mass lesion/mass effect. Atrophy and chronic microvascular ischemic changes are noted. Vascular: No hyperdense vessel or unexpected calcification. Skull: Normal. Negative for fracture or focal lesion. Sinuses/Orbits: There is a mucosal retention cyst within the left maxillary sinus. The remaining paranasal sinuses and mastoid air cells are essentially clear. Other: None. IMPRESSION: 1. No acute intracranial abnormality. 2. Chronic findings as detailed above. Electronically Signed   By: Constance Holster M.D.   On: 11/01/2019 16:54   DG Swallowing Func-Speech Pathology  Result Date: 11/02/2019 Objective Swallowing Evaluation: Type of Study: MBS-Modified Barium Swallow Study  Patient Details Name: Jamaris Piekarski MRN: [id number] Date of Birth: 04/03/1931 Today's Date: 11/02/2019 Time: SLP Start Time (ACUTE ONLY): 1309 -SLP Stop Time (ACUTE ONLY): 1335 SLP Time Calculation (min) (ACUTE ONLY): 26 min Past Medical History: Past Medical History: Diagnosis Date . Hypothyroidism  . Osteoporosis  . Thyroid disease  Past Surgical History: Past Surgical History: Procedure  Laterality Date . CHOLECYSTECTOMY   . HIP FRACTURE SURGERY   . INTRAMEDULLARY (IM) NAIL INTERTROCHANTERIC Right 08/13/2017  Procedure: INTRAMEDULLARY (IM) NAIL INTERTROCHANTRIC;  Surgeon: Earnestine Leys, MD;  Location: [facility] ORS;  Service: Orthopedics;  Laterality: Right; . SHOULDER SURGERY   HPI: Mr. Triton Altice is an  86 y.o. yo male w/ PMH significant for hypothyroidism, hyperlipidemia, hereditary spherocytosis and recent diagnosis of dementia presenting with one week of progressively worsening confusion.  MRI brain on 2/28 reported: "Left temporal lobe is abnormal with increased signal on T2 and FLAIR and cortical thickening and medially and anteriorly. There is a small area of non masslike enhancement in the left anterior temporal lobe. The patient was febrile on admission. 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 Infiltrating tumor also in the differential.  Subjective: Pt was awake but confused Assessment / Plan / Recommendation CHL IP CLINICAL IMPRESSIONS 11/02/2019 Clinical Impression Pt presents with severe sensorimotor oropharyngeal dypshagia that is excerbated by pt's severe cognitive impairments. Pt has severely impaired oral phase that is c/b lengthy (>15 to 20 seconds) oral holding d/t weak lingual manipulation, lingual pumping, reduced posterior propulsion,  holding of bolus, decreased bolus cohesion, delayed oral transit, premature spillage, piecemeal swallowing and oral residue requiring multiple swallow to clear boluses. Ptis pharyngeal phase is c/b delayed swallow initiation at the vallecula and pyriform sinuses, reduced pharyngeal peristalsis, that results in severe residue within the vallecula with silent aspiration of the honey thick liquids after the swallow and trace aspiration of puree. All aspiration was silent. Unfortunately pt is not able to follow directions, focus attention to task with Total A multimodal cues. At this time, I recommend that pt remain NPO. Should  family wish for pt to consume PO intake with known risk of aspiration, honey thick by spoon and puree would be most appropriate. SLP Visit Diagnosis Dysphagia, oropharyngeal phase (R13.12) Attention and concentration deficit following -- Frontal lobe and executive function deficit following -- Impact on safety and function Risk for inadequate nutrition/hydration;Severe aspiration risk   CHL IP TREATMENT RECOMMENDATION 11/02/2019 Treatment Recommendations Therapy as outlined in treatment plan below   Prognosis 10/15/2019 Prognosis for Safe Diet Advancement Fair Barriers to Reach Goals Cognitive deficits;Language deficits Barriers/Prognosis Comment -- CHL IP DIET RECOMMENDATION 11/02/2019 SLP Diet Recommendations NPO Liquid Administration via -- Medication Administration Via alternative means Compensations -- Postural Changes --   CHL IP OTHER RECOMMENDATIONS 11/02/2019 Recommended Consults -- Oral Care Recommendations Oral care QID Other Recommendations --   CHL IP FOLLOW UP RECOMMENDATIONS 10/18/2019 Follow up Recommendations 24 hour supervision/assistance   CHL IP FREQUENCY AND DURATION 10/15/2019 Speech Therapy Frequency (ACUTE ONLY) min 2x/week Treatment Duration 2 weeks      CHL IP ORAL PHASE 11/02/2019 Oral Phase Impaired Oral - Pudding Teaspoon -- Oral - Pudding Cup -- Oral - Honey Teaspoon Weak lingual manipulation;Lingual pumping;Reduced posterior propulsion;Holding of bolus;Lingual/palatal residue;Piecemeal swallowing;Premature spillage;Decreased bolus cohesion;Delayed oral transit Oral - Honey Cup -- Oral - Nectar Teaspoon NT Oral - Nectar Cup NT Oral - Nectar Straw -- Oral - Thin Teaspoon NT Oral - Thin Cup NT Oral - Thin Straw -- Oral - Puree Weak lingual manipulation;Lingual pumping;Reduced posterior propulsion;Holding of bolus;Lingual/palatal residue;Piecemeal swallowing;Delayed oral transit;Decreased bolus cohesion;Premature spillage Oral - Mech Soft -- Oral - Regular -- Oral - Multi-Consistency -- Oral -  Pill -- Oral Phase - Comment --  CHL IP PHARYNGEAL PHASE 11/02/2019 Pharyngeal Phase Impaired Pharyngeal- Pudding Teaspoon -- Pharyngeal -- Pharyngeal- Pudding Cup -- Pharyngeal -- Pharyngeal- Honey Teaspoon Delayed swallow initiation-vallecula;Delayed swallow initiation-pyriform sinuses;Reduced pharyngeal peristalsis;Reduced tongue base retraction;Penetration/Apiration after swallow;Moderate aspiration;Pharyngeal residue - valleculae Pharyngeal Material enters airway, passes BELOW cords without attempt by patient to eject out (silent aspiration) Pharyngeal- Honey Cup -- Pharyngeal -- Pharyngeal- Nectar Teaspoon NT Pharyngeal -- Pharyngeal- Nectar Cup NT Pharyngeal -- Pharyngeal- Nectar Straw -- Pharyngeal -- Pharyngeal- Thin Teaspoon NT Pharyngeal -- Pharyngeal- Thin Cup NT Pharyngeal -- Pharyngeal- Thin Straw NT Pharyngeal -- Pharyngeal- Puree Delayed swallow initiation-vallecula;Delayed swallow initiation-pyriform sinuses;Reduced pharyngeal peristalsis;Reduced tongue base retraction;Pharyngeal residue - valleculae;Trace aspiration;Penetration/Apiration after swallow Pharyngeal Material enters airway, passes BELOW cords without attempt by patient to eject out (silent aspiration) Pharyngeal- Mechanical Soft -- Pharyngeal -- Pharyngeal- Regular -- Pharyngeal -- Pharyngeal- Multi-consistency -- Pharyngeal -- Pharyngeal- Pill -- Pharyngeal -- Pharyngeal Comment --  CHL IP CERVICAL ESOPHAGEAL PHASE 11/02/2019 Cervical Esophageal Phase WFL Pudding Teaspoon -- Pudding Cup -- Honey Teaspoon -- Honey Cup -- Nectar Teaspoon -- Nectar Cup -- Nectar Straw -- Thin Teaspoon -- Thin Cup -- Thin Straw -- Puree -- Mechanical Soft -- Regular -- Multi-consistency -- Pill -- Cervical Esophageal Comment -- [name] Overton 11/02/2019, 3:31 PM  EEG adult  Result Date: 11/01/2019 Lora Havens, MD     11/01/2019 11:02 AM Patient Name: Ayoub Dockett MRN: [id number] Epilepsy Attending: Lora Havens Referring  Physician/Provider: Reesa Chew, PA Date: 11/01/2019 Duration: 23.54 mins Patient history:86 year old male with progressive confusion. MRI showed left temporal lobe is abnormal with increased signal on T2 and FLAIR and cortical thickening and medially and anteriorly. There is a small area of non masslike enhancement in the left anterior temporal lobe.EEG to evaluate for seizure.  Level of alertness:awake  AEDs during EEG study:LEV  Technical aspects: This EEG study was done with scalp electrodes positioned according to the 10-20 International system of electrode placement. Electrical activity was acquired at a sampling rate of 500Hz  and reviewed with a high frequency filter of 70Hz  and a low frequency filter of 1Hz . EEG data were recorded continuously and digitally stored.  DESCRIPTION: No clear posterior dominant rhythm was seen.EEG showed continuous generalized, maximal left temporal region, 3-6hz  theta-delta slowing. Hyperventilation and photic stimulation were not performed.  ABNORMALITY - Continuous slow, generalized and maximal left anterior temporal  IMPRESSION: This studyshowed evidence of cortical dysfunction in left anterior temporal region. Additionally, there is evidence of mild to moderate diffuse encephalopathy, non specific to etiology.No seizures were seen throughout the recording. EEG appeared to be improving compared to previous study on 10/16/2019.  Lora Havens    Assessment: 86 yo male with dementia, herpes encephalitis and dysphagia with c/o worsening dysphagia. CTH did not show anything acute yesterday. On exam today he is awake and alert. No focal findings.  Recommendations:   Laurey Morale, MSN, NP-C [facility] [phone]  Attending neurologist's note to follow   11/03/2019, 10:21 AM  I have seen the patient and reviewed the above note. He does have some aphasia, but per wife and staff, he has had significant improvement since admission,  with the exception of his ability to swallow. I question a mild right facial weakness as well. With improvement in other aspects of his mentation, I do not think that recurrance or post-HSV autoimmune encephalitis are at all likely. I do think that an MRI to rule out acute stroke would be prudent, but if this is negative, then I am not sure that I would modify therapy other than continuing to work with [facility]. An option of an NG tube while he is working with [facility] would be one option, but this was discussed with the family today and the current plan is to continue with diet and avoid feeding tubes of all types.   1) MRI brain 2) If negative, continue supportive care.    Roland Rack, MD [facility] [phone]  If 7pm- 7am, please page neurology on call as listed in [place].

## 2019-11-03 NOTE — Progress Notes (Signed)
Speech Language Pathology Weekly Progress and Session Note  Patient Details  Name: Eric Larsen MRN: [id number] Date of Birth: 1931-06-28  Beginning of progress report period: October 27, 2019 End of progress report period: November 03, 2019  Today's Date: 11/03/2019 SLP Individual Time: 1059-1140 SLP Individual Time Calculation (min): 41 min  Short Term Goals: Week 2: SLP Short Term Goal 1 (Week 2): Pt will consume dysphagia 2 diet with Mod A cues for use of compensatory swallow strategies to clear oral residue. SLP Short Term Goal 1 - Progress (Week 2): Discontinued (comment)(MBS documented severe oral phase deficits) SLP Short Term Goal 2 (Week 2): Pt will focus attention to basic task for ~ 1 minute with Max A multimodal cues for redirection. SLP Short Term Goal 2 - Progress (Week 2): Not met SLP Short Term Goal 3 (Week 2): Pt will perform basic counting tasks to 10 with Max A cues. SLP Short Term Goal 3 - Progress (Week 2): Discontinued (comment)(unachieveable at this time) SLP Short Term Goal 4 (Week 2): Pt will read information on calendar to state date with Max A cues. SLP Short Term Goal 4 - Progress (Week 2): Not met SLP Short Term Goal 5 (Week 2): Pt will utilize speech intelligibility strategies at the phrase level to achieve 50% intelligibility with Max verbal cues. SLP Short Term Goal 5 - Progress (Week 2): Discontinued (comment)(pt doesn't present with dysarthria)    New Short Term Goals: Week 3: SLP Short Term Goal 1 (Week 3): Pt will focus attention to basic task for ~ 1 minute with Max A multimodal cues for redirection. SLP Short Term Goal 2 (Week 3): Pt will folow 1 step very basic direction in 5 out of 10 opportunities with Mod A cues. SLP Short Term Goal 3 (Week 3): Pt will read information on calendar to state date and location with Max A cues.  Weekly Progress Updates:  Pt has not demonstrated the ability for new learning. Additionally, pt's mentation and level  of alertness have vastly fluctuated during this reporting period. Recent MBS on 11/02/19 recommended pt remain NPO. Education was provided to pt's wife, images viewed with her. Per MD, meeting with pt's daughter's and wife had result of returning pt to dysphagia 1 diet with honey thick liquids and known aspiration risk.  At this time, dysphagia goals are discontinued with cognition goals to be focus of next reporting period. If cognition/alertness should consistently improve, another MBS might be indicated. ST will continue to monitor for any signs of cognitive readiness.     Pt continues to present with severe deficits in memory, following directions, answering 1 step directions, focused/sustained attention, orientation and basic problem solving.  Education has also been provided to pt's wife on pt's severe deficits in expression. He doesn't present with dysarthria but rather produces "word salads" that are communicative as they don't pertain to anything contextual. As such, dysarthria goals have been discontinued.       Intensity: Minumum of 1-2 x/day, 30 to 90 minutes Frequency: 3 to 5 out of 7 days Duration/Length of Stay: TBD Treatment/Interventions: Cognitive remediation/compensation;Speech/Language facilitation;Functional tasks;Patient/family education;Dysphagia/aspiration precaution training   Daily Session  Skilled Therapeutic Interventions:   Skilled treatment session targeted cognition goals. SLP facilitated by providing Total multimodal A to follow SLP's request to give block to this writer per color requested. Pt unable to complete d/t inability to recall specified color. Pt was able to identify color in his hand but unable to select requested color. With Total  A (semantic cues, binary choices and yes/no) pt was not able to answer any orientation questions or biographical questions. Pt with frequent word salads that were off-topic and difficult to redirect. Pt left upright in  wheelchair with his wife present and all needs within reach.       General    Pain    Therapy/Group: Individual Therapy  Daci Stubbe 11/03/2019, 4:10 PM

## 2019-11-03 NOTE — Progress Notes (Addendum)
[facility] PHYSICAL MEDICINE & REHABILITATION PROGRESS NOTE  Subjective/Complaints: Discussed MBS results and reviewed images with SLP [name] this morning. Plan for family meeting at 12pm with Eric Larsen wife and children. Will need to determine nutrition and disposition plan. Walked supervision with therapy today!  ROS: sedated, cannot due to cognition  Objective: Vital Signs: Blood pressure 124/68, pulse 89, temperature 97.7 F (36.5 C), temperature source Oral, resp. rate 18, height 5\' 4"  (1.626 m), weight 50.8 kg, SpO2 98 %. DG Abd 1 View  Result Date: 11/01/2019 CLINICAL DATA:  Constipation EXAM: ABDOMEN - 1 VIEW COMPARISON:  10/15/2019 FINDINGS: Nonobstructed bowel gas pattern with radiopaque contrast in the colon. Mild stool. Left hip replacement. Right femoral intramedullary rod. IMPRESSION: Nonobstructed bowel-gas pattern with contrast in the colon Electronically Signed   By: Donavan Foil M.D.   On: 11/01/2019 18:20   CT HEAD WO CONTRAST  Result Date: 11/01/2019 CLINICAL DATA:  Dysphagia.  Herpes encephalitis. EXAM: CT HEAD WITHOUT CONTRAST TECHNIQUE: Contiguous axial images were obtained from the base of the skull through the vertex without intravenous contrast. COMPARISON:  July 05, 2019. FINDINGS: Brain: No evidence of acute infarction, hemorrhage, hydrocephalus, extra-axial collection or mass lesion/mass effect. Atrophy and chronic microvascular ischemic changes are noted. Vascular: No hyperdense vessel or unexpected calcification. Skull: Normal. Negative for fracture or focal lesion. Sinuses/Orbits: There is a mucosal retention cyst within the left maxillary sinus. The remaining paranasal sinuses and mastoid air cells are essentially clear. Other: None. IMPRESSION: 1. No acute intracranial abnormality. 2. Chronic findings as detailed above. Electronically Signed   By: Constance Holster M.D.   On: 11/01/2019 16:54   DG Swallowing Func-Speech Pathology  Result Date:  11/02/2019 Objective Swallowing Evaluation: Type of Study: MBS-Modified Barium Swallow Study  Patient Details Name: Eric Larsen MRN: [id number] Date of Birth: 01/10/1931 Today's Date: 11/02/2019 Time: SLP Start Time (ACUTE ONLY): 1309 -SLP Stop Time (ACUTE ONLY): 1335 SLP Time Calculation (min) (ACUTE ONLY): 26 min Past Medical History: Past Medical History: Diagnosis Date . Hypothyroidism  . Osteoporosis  . Thyroid disease  Past Surgical History: Past Surgical History: Procedure Laterality Date . CHOLECYSTECTOMY   . HIP FRACTURE SURGERY   . INTRAMEDULLARY (IM) NAIL INTERTROCHANTERIC Right 08/13/2017  Procedure: INTRAMEDULLARY (IM) NAIL INTERTROCHANTRIC;  Surgeon: Earnestine Leys, MD;  Location: [facility] ORS;  Service: Orthopedics;  Laterality: Right; . SHOULDER SURGERY   HPI: Eric Larsen is an  86 y.o. yo male w/ PMH significant for hypothyroidism, hyperlipidemia, hereditary spherocytosis and recent diagnosis of dementia presenting with one week of progressively worsening confusion.  MRI brain on 2/28 reported: "Left temporal lobe is abnormal with increased signal on T2 and FLAIR and cortical thickening and medially and anteriorly. There is a small area of non masslike enhancement in the left anterior temporal lobe. The patient was febrile on admission. 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 Infiltrating tumor also in the differential.  Subjective: Pt was awake but confused Assessment / Plan / Recommendation CHL IP CLINICAL IMPRESSIONS 11/02/2019 Clinical Impression Pt presents with severe sensorimotor oropharyngeal dypshagia that is excerbated by pt's severe cognitive impairments. Pt has severely impaired oral phase that is c/b lengthy (>15 to 20 seconds) oral holding d/t weak lingual manipulation, lingual pumping, reduced posterior propulsion, holding of bolus, decreased bolus cohesion, delayed oral transit, premature spillage, piecemeal swallowing and oral residue requiring multiple  swallow to clear boluses. Ptis pharyngeal phase is c/b delayed swallow initiation at the vallecula and pyriform sinuses, reduced pharyngeal peristalsis, that results  in severe residue within the vallecula with silent aspiration of the honey thick liquids after the swallow and trace aspiration of puree. All aspiration was silent. Unfortunately pt is not able to follow directions, focus attention to task with Total A multimodal cues. At this time, I recommend that pt remain NPO. Should family wish for pt to consume PO intake with known risk of aspiration, honey thick by spoon and puree would be most appropriate. SLP Visit Diagnosis Dysphagia, oropharyngeal phase (R13.12) Attention and concentration deficit following -- Frontal lobe and executive function deficit following -- Impact on safety and function Risk for inadequate nutrition/hydration;Severe aspiration risk   CHL IP TREATMENT RECOMMENDATION 11/02/2019 Treatment Recommendations Therapy as outlined in treatment plan below   Prognosis 10/15/2019 Prognosis for Safe Diet Advancement Fair Barriers to Reach Goals Cognitive deficits;Language deficits Barriers/Prognosis Comment -- CHL IP DIET RECOMMENDATION 11/02/2019 SLP Diet Recommendations NPO Liquid Administration via -- Medication Administration Via alternative means Compensations -- Postural Changes --   CHL IP OTHER RECOMMENDATIONS 11/02/2019 Recommended Consults -- Oral Care Recommendations Oral care QID Other Recommendations --   CHL IP FOLLOW UP RECOMMENDATIONS 10/18/2019 Follow up Recommendations 24 hour supervision/assistance   CHL IP FREQUENCY AND DURATION 10/15/2019 Speech Therapy Frequency (ACUTE ONLY) min 2x/week Treatment Duration 2 weeks      CHL IP ORAL PHASE 11/02/2019 Oral Phase Impaired Oral - Pudding Teaspoon -- Oral - Pudding Cup -- Oral - Honey Teaspoon Weak lingual manipulation;Lingual pumping;Reduced posterior propulsion;Holding of bolus;Lingual/palatal residue;Piecemeal swallowing;Premature  spillage;Decreased bolus cohesion;Delayed oral transit Oral - Honey Cup -- Oral - Nectar Teaspoon NT Oral - Nectar Cup NT Oral - Nectar Straw -- Oral - Thin Teaspoon NT Oral - Thin Cup NT Oral - Thin Straw -- Oral - Puree Weak lingual manipulation;Lingual pumping;Reduced posterior propulsion;Holding of bolus;Lingual/palatal residue;Piecemeal swallowing;Delayed oral transit;Decreased bolus cohesion;Premature spillage Oral - Mech Soft -- Oral - Regular -- Oral - Multi-Consistency -- Oral - Pill -- Oral Phase - Comment --  CHL IP PHARYNGEAL PHASE 11/02/2019 Pharyngeal Phase Impaired Pharyngeal- Pudding Teaspoon -- Pharyngeal -- Pharyngeal- Pudding Cup -- Pharyngeal -- Pharyngeal- Honey Teaspoon Delayed swallow initiation-vallecula;Delayed swallow initiation-pyriform sinuses;Reduced pharyngeal peristalsis;Reduced tongue base retraction;Penetration/Apiration after swallow;Moderate aspiration;Pharyngeal residue - valleculae Pharyngeal Material enters airway, passes BELOW cords without attempt by patient to eject out (silent aspiration) Pharyngeal- Honey Cup -- Pharyngeal -- Pharyngeal- Nectar Teaspoon NT Pharyngeal -- Pharyngeal- Nectar Cup NT Pharyngeal -- Pharyngeal- Nectar Straw -- Pharyngeal -- Pharyngeal- Thin Teaspoon NT Pharyngeal -- Pharyngeal- Thin Cup NT Pharyngeal -- Pharyngeal- Thin Straw NT Pharyngeal -- Pharyngeal- Puree Delayed swallow initiation-vallecula;Delayed swallow initiation-pyriform sinuses;Reduced pharyngeal peristalsis;Reduced tongue base retraction;Pharyngeal residue - valleculae;Trace aspiration;Penetration/Apiration after swallow Pharyngeal Material enters airway, passes BELOW cords without attempt by patient to eject out (silent aspiration) Pharyngeal- Mechanical Soft -- Pharyngeal -- Pharyngeal- Regular -- Pharyngeal -- Pharyngeal- Multi-consistency -- Pharyngeal -- Pharyngeal- Pill -- Pharyngeal -- Pharyngeal Comment --  CHL IP CERVICAL ESOPHAGEAL PHASE 11/02/2019 Cervical Esophageal Phase  WFL Pudding Teaspoon -- Pudding Cup -- Honey Teaspoon -- Honey Cup -- Nectar Teaspoon -- Nectar Cup -- Nectar Straw -- Thin Teaspoon -- Thin Cup -- Thin Straw -- Puree -- Mechanical Soft -- Regular -- Multi-consistency -- Pill -- Cervical Esophageal Comment -- [name] Overton 11/02/2019, 3:31 PM              EEG adult  Result Date: 11/01/2019 Lora Havens, MD     11/01/2019 11:02 AM Patient Name: Eric Larsen MRN: [id number] Epilepsy Attending: Lora Havens Referring Physician/Provider:  Reesa Chew, [place] Date: 11/01/2019 Duration: 23.54 mins Patient history:86 year old male with progressive confusion. MRI showed left temporal lobe is abnormal with increased signal on T2 and FLAIR and cortical thickening and medially and anteriorly. There is a small area of non masslike enhancement in the left anterior temporal lobe.EEG to evaluate for seizure.  Level of alertness:awake  AEDs during EEG study:LEV  Technical aspects: This EEG study was done with scalp electrodes positioned according to the 10-20 International system of electrode placement. Electrical activity was acquired at a sampling rate of 500Hz  and reviewed with a high frequency filter of 70Hz  and a low frequency filter of 1Hz . EEG data were recorded continuously and digitally stored.  DESCRIPTION: No clear posterior dominant rhythm was seen.EEG showed continuous generalized, maximal left temporal region, 3-6hz  theta-delta slowing. Hyperventilation and photic stimulation were not performed.  ABNORMALITY - Continuous slow, generalized and maximal left anterior temporal  IMPRESSION: This studyshowed evidence of cortical dysfunction in left anterior temporal region. Additionally, there is evidence of mild to moderate diffuse encephalopathy, non specific to etiology.No seizures were seen throughout the recording. EEG appeared to be improving compared to previous study on 10/16/2019.  Lora Havens   Recent Labs    11/01/19 0416  11/02/19 0418  WBC 10.4 11.3*  HGB 10.8* 10.5*  HCT 32.2* 30.9*  PLT 287 292   Recent Labs    10/31/19 2349 11/01/19 1436  NA 136 134*  K 3.7 3.7  CL 99 97*  CO2 29 27  GLUCOSE 135* 126*  BUN 23 22  CREATININE 0.90 0.71  CALCIUM 9.5 9.6    Physical Exam: BP 124/68 (BP Location: Left Arm)   Pulse 89   Temp 97.7 F (36.5 C) (Oral)   Resp 18   Ht 5\' 4"  (1.626 m)   Wt 50.8 kg   SpO2 98%   BMI 19.21 kg/m  Constitutional: Frail, cachectic, confused HENT: kept eyes closed most of time Cardiovascular:no JVD; quiet heard sounds Respiratory: CTA B/L- a little coarse at bases, but good air movement GI: soft, NT, ND, (+) hypoactive BS Skin: Warm and dry.  Intact. Psych: confused, tangential speech Musc: No edema in extremities.  No tenderness in extremities. Neurological: left arm with resting tremor.  Motor: Appears to be 4+/5 throughout, unchanged Ambulated supervision with therapy today  Assessment/Plan: 1. 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  Physiatrist is providing close team supervision and 24 hour management of active medical problems listed below.  Physiatrist and rehab team continue to assess barriers to discharge/monitor patient progress toward functional and medical goals  Care Tool:  Bathing    Body parts bathed by patient: Right arm, Left arm, Chest, Abdomen, Face, Left upper leg, Right upper leg   Body parts bathed by helper: Left lower leg, Right lower leg, Front perineal area, Buttocks     Bathing assist Assist Level: 2 Helpers     Upper Body Dressing/Undressing Upper body dressing   What is the patient wearing?: Pull over shirt    Upper body assist Assist Level: 2 Helpers    Lower Body Dressing/Undressing Lower body dressing      What is the patient wearing?: Pants     Lower body assist Assist for lower body  dressing: Total Assistance - Patient < 25%     Toileting Toileting    Toileting assist Assist for toileting: Dependent - Patient 0%  Transfers Chair/bed transfer  Transfers assist     Chair/bed transfer assist level: 2 Helpers     Locomotion Ambulation   Ambulation assist      Assist level: 2 helpers Assistive device: Walker-rolling Max distance: 34ft   Walk 10 feet activity   Assist     Assist level: 2 helpers Assistive device: Walker-rolling   Walk 50 feet activity   Assist Walk 50 feet with 2 turns activity did not occur: Safety/medical concerns(fatigued)         Walk 150 feet activity   Assist Walk 150 feet activity did not occur: Safety/medical concerns(fatigued)         Walk 10 feet on uneven surface  activity   Assist Walk 10 feet on uneven surfaces activity did not occur: Safety/medical concerns(fatigued)         Wheelchair     Assist Will patient use wheelchair at discharge?: Yes Type of Wheelchair: Manual    Wheelchair assist level: Dependent - Patient 0%      Wheelchair 50 feet with 2 turns activity    Assist        Assist Level: Dependent - Patient 0%   Wheelchair 150 feet activity     Assist     Assist Level: Dependent - Patient 0%      Medical Problem List and Plan: 1.  Cognitive deficits with unintelligible language, weakness, secondary to herpes encephalitis.  Continue CIR             Continue IV acyclovir until 3/21, appreciate pharm consult. Will discuss with neurology today whether extension of this could be beneficial.   Team conference Wednesday; DC planned for 3/27.  2.  Antithrombotics: -DVT/anticoagulation:  Pharmaceutical: Lovenox             -antiplatelet therapy: NA 3. Pain Management: PRN medications. Lidocaine patch ordered for left shoulder. Pain better controlled.   3/13- pt appeared to keep pointing at legs like they hurt- will try Lyrica 25 mg BID- if has side effects,  will stop-  3/14- too sedated this AM- will stop all med changes made.    3/18: attention has improved.  4. Mood: Team support             -antipsychotic agents: NA 5. Neuropsych: This patient is not capable of making decisions on his own behalf.  Telesitter for safety  See #14  Appreciate neuropsych eval 6. Skin/Wound Care: Routine pressure relief measures.  7. Fluids/Electrolytes/Nutrition: Monitor I/O.   BMP within acceptable range except for glucose on 3/8, labs ordered for Monday  3/18: High aspiration risk. Changed to NPO by SLP. MBS this morning. I will discuss results with wife once available. Wife has stated she prefers po intake to PEG despite aspiration risk.   3/19: Reviewed MBS images with [name] today and will discuss with family at family meeting.  8. Hypothyroid: Continue Synthroid 126mcg. 9. Epileptogenic potential: Conitnue Keppra 500 mg bid for seizure  No seizures from admission-3/12 10. Hypokalemia: Resolved 11. Macrocytic anemia:   Hemoglobin 10.0 on 3/12, labs ordered for Monday  Vitamin B12 within normal limits  Folate WNL  Hemoccult remains pending, discussed   Continue to monitor 12.  Hypoalbuminemia  Supplement initiated on 3/4 13.  Severe Oral Dysphagia and mild to moderate pharyngeal dysphagia:   D1 thins, advance diet as tolerated  Reviewed 3/15 SLP note: trialed D2 meal with thins, had prolonged AP transit and mild oral residue. Continue current diet.   3/17: Possible  MBS tomorrow. Will discuss with [name] SLP after goals of care discussion with his wife tomorrow. CXR reviewed and is not suspicious for pneumonia, especially given afebrile and with reducing WBC. If suspicion for pneumonia arises, unasyn would be preferred broad spectrum antibiotic as does not lower seizure threshold.   3/18-19: See #7 14. Lethargy/decreased attention: Medications reviewed and he is not taking any overly sedating medications.   Sleep chart ordered, consider meds  accordingly  3/14- pt sedated this AM- likely hangover from Lyrica 25 mg BID or trazodone 25 mg QHS or both- will stop both  3/15: Still overly sedated. DC melatonin and zyprexa.  15. Severe kyphosis: position with pillows when sleeping and sitting in chair, placed nursing order.  16.  Labile blood pressure  Controlled on 3/12  3/16: Hypotensive. Not on any BP medications. Ritalin will possibly provide some boost to BP.  17. Insomnia  3/13- sleep chart showed didn't sleep well last night- will try trazodone 25 mg QHS at most-   3/14- will d/c trazodone- might benefit from melatonin, but will let Dr Posey Pronto address Monday  Avoid sleep agents at this time since they overly sedate patient. Wife states that when patient had Ritalin in past it improved his attention during the day and lessened his sundowning at night. Will trial low dose 5mg  today.  18. Hypokalemia  3/13- K+ 3.2- will replete 40 mEq x2 and recheck Monday.   3/14- labs scheduled for Monday.   3/15: 3.9, stable.  19. Impaired cognition secondary to dementia and encephalitis: Head CT today. EEG performed today and shows improvement since 3/1 EEG.   3/18: Head CT reviewed and is stable.   3/19: Given continued impaired neurological status, will discuss with Neuro whether MRI is warranted.  20. Disposition: Had long discussion with patient and his wife yesterday afternoon. All sedating medication discontinued and will trial Ritalin again to stimulate during the day.    3/17: I will call wife to update on progress today. He is currently NPO and will need to discuss with her whether she would like to proceed with a modifed barium study tomorrow and whether she would want to consider PEG for him.   3/19: Family meeting today at noon, son, 2 daughters, wife, and patient participated and agreed on accepting the risk of aspiration pneumonia and offering patient po for comfort and pleasure. Restarted D1 honey thick diet. Discussed with Reesa Chew  and [name] SLP  LOS: 16 days A FACE TO FACE EVALUATION WAS PERFORMED  Clide Deutscher Wilda Wetherell 11/03/2019, 10:48 AM

## 2019-11-04 ENCOUNTER — Inpatient Hospital Stay (HOSPITAL_COMMUNITY): Payer: Medicare Other

## 2019-11-04 LAB — BASIC METABOLIC PANEL
Anion gap: 8 (ref 5–15)
BUN: 12 mg/dL (ref 8–23)
CO2: 26 mmol/L (ref 22–32)
Calcium: 8.6 mg/dL — ABNORMAL LOW (ref 8.9–10.3)
Chloride: 101 mmol/L (ref 98–111)
Creatinine, Ser: 0.8 mg/dL (ref 0.61–1.24)
GFR calc Af Amer: >60 mL/min (ref >60)
GFR calc non Af Amer: >60 mL/min (ref >60)
Glucose, Bld: 123 mg/dL — ABNORMAL HIGH (ref 70–99)
Potassium: 3 mmol/L — ABNORMAL LOW (ref 3.5–5.1)
Sodium: 135 mmol/L (ref 135–145)

## 2019-11-04 LAB — CBC
HCT: 26.7 % — ABNORMAL LOW (ref 39.0–52.0)
Hemoglobin: 9.1 g/dL — ABNORMAL LOW (ref 13.0–17.0)
MCH: 35.5 pg — ABNORMAL HIGH (ref 26.0–34.0)
MCHC: 34.1 g/dL (ref 30.0–36.0)
MCV: 104.3 fL — ABNORMAL HIGH (ref 80.0–100.0)
Platelets: 245 10*3/uL (ref 150–400)
RBC: 2.56 MIL/uL — ABNORMAL LOW (ref 4.22–5.81)
RDW: 27.6 % — ABNORMAL HIGH (ref 11.5–15.5)
WBC: 9.2 10*3/uL (ref 4.0–10.5)
nRBC: 0 % (ref 0.0–0.2)

## 2019-11-04 MED ORDER — POTASSIUM CHLORIDE 10 MEQ/100ML IV SOLN
10.0000 meq | INTRAVENOUS | Status: AC
  Administered 2019-11-04 (×3): 10 meq via INTRAVENOUS
  Filled 2019-11-04 (×3): qty 100

## 2019-11-04 NOTE — Progress Notes (Signed)
[place] PHYSICAL MEDICINE & REHABILITATION PROGRESS NOTE  Subjective/Complaints: Wife at bedside patient sleeping  ROS: sedated, cannot due to cognition  Objective: Vital Signs: Blood pressure 106/72, pulse 72, temperature (!) 97.5 F (36.4 C), temperature source Oral, resp. rate 16, height 5\' 4"  (1.626 m), weight 50.8 kg, SpO2 96 %. MR BRAIN WO CONTRAST  Result Date: 11/04/2019 CLINICAL DATA:  Stroke follow-up EXAM: MRI HEAD WITHOUT CONTRAST TECHNIQUE: Multiplanar, multiecho pulse sequences of the brain and surrounding structures were obtained without intravenous contrast. COMPARISON:  Brain MRI 10/15/2019 FINDINGS: Brain: No acute infarct, acute hemorrhage or extra-axial collection. Early confluent hyperintense T2-weighted signal of the periventricular and deep white matter, most commonly due to chronic ischemic microangiopathy. Unchanged hyperintensity in the anterior left temporal lobe. There is generalized atrophy without lobar predilection. No chronic microhemorrhage. Normal midline structures. Vascular: Normal flow voids. Skull and upper cervical spine: Normal marrow signal. Sinuses/Orbits: Negative. Other: None. IMPRESSION: 1. No acute intracranial abnormality. 2. Unchanged findings of chronic ischemic microangiopathy and generalized atrophy. Electronically Signed   By: Ulyses Jarred M.D.   On: 11/04/2019 01:53   Recent Labs    11/02/19 0418 11/04/19 0401  WBC 11.3* 9.2  HGB 10.5* 9.1*  HCT 30.9* 26.7*  PLT 292 245   Recent Labs    11/01/19 1436 11/04/19 1040  NA 134* 135  K 3.7 3.0*  CL 97* 101  CO2 27 26  GLUCOSE 126* 123*  BUN 22 12  CREATININE 0.71 0.80  CALCIUM 9.6 8.6*    Physical Exam: BP 106/72 (BP Location: Left Arm)   Pulse 72   Temp (!) 97.5 F (36.4 C) (Oral)   Resp 16   Ht 5\' 4"  (1.626 m)   Wt 50.8 kg   SpO2 96%   BMI 19.21 kg/m   General: No acute distress Mood and affect are appropriate Heart: Regular rate and rhythm no rubs murmurs or  extra sounds Lungs: Clear to auscultation, breathing unlabored, no rales or wheezes Abdomen: Positive bowel sounds, soft nontender to palpation, nondistended Extremities: No clubbing, cyanosis, or edema Skin: No evidence of breakdown, no evidence of rash Neurologic: Cranial nerves II through XII intact, motor strength is 4+/5 in bilateral deltoid, bicep, tricep, grip, hip flexor, knee extensors, ankle dorsiflexor and plantar flexor  Musculoskeletal: Thoracic kyphosis  Assessment/Plan: 1. 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  Physiatrist is providing close team supervision and 24 hour management of active medical problems listed below.  Physiatrist and rehab team continue to assess barriers to discharge/monitor patient progress toward functional and medical goals  Care Tool:  Bathing    Body parts bathed by patient: Right arm, Left arm, Chest, Abdomen, Face, Left upper leg, Right upper leg   Body parts bathed by helper: Left lower leg, Right lower leg, Front perineal area, Buttocks     Bathing assist Assist Level: Minimal Assistance - Patient > 75%     Upper Body Dressing/Undressing Upper body dressing   What is the patient wearing?: Pull over shirt    Upper body assist Assist Level: Minimal Assistance - Patient > 75%    Lower Body Dressing/Undressing Lower body dressing      What is the patient wearing?: Pants     Lower body assist Assist for lower body dressing: Maximal Assistance - Patient 25 - 49%     Toileting Toileting    Toileting assist Assist for toileting: Maximal  Assistance - Patient 25 - 49%     Transfers Chair/bed transfer  Transfers assist     Chair/bed transfer assist level: Minimal Assistance - Patient > 75%     Locomotion Ambulation   Ambulation assist      Assist level: 2 helpers Assistive device:  Walker-rolling Max distance: 60ft   Walk 10 feet activity   Assist     Assist level: 2 helpers Assistive device: Walker-rolling   Walk 50 feet activity   Assist Walk 50 feet with 2 turns activity did not occur: Safety/medical concerns(fatigued)  Assist level: 2 helpers Assistive device: Walker-rolling    Walk 150 feet activity   Assist Walk 150 feet activity did not occur: Safety/medical concerns(fatigued)         Walk 10 feet on uneven surface  activity   Assist Walk 10 feet on uneven surfaces activity did not occur: Safety/medical concerns(fatigued)         Wheelchair     Assist Will patient use wheelchair at discharge?: Yes Type of Wheelchair: Manual    Wheelchair assist level: Dependent - Patient 0%      Wheelchair 50 feet with 2 turns activity    Assist        Assist Level: Dependent - Patient 0%   Wheelchair 150 feet activity     Assist     Assist Level: Dependent - Patient 0%      Medical Problem List and Plan: 1.  Cognitive deficits with unintelligible language, weakness, secondary to herpes encephalitis.  Continue CIR             Continue IV acyclovir until 3/21, appreciate pharm consult. Will discuss with neurology today whether extension of this could be beneficial.   Team conference Wednesday; DC planned for 3/27.  2.  Antithrombotics: -DVT/anticoagulation:  Pharmaceutical: Lovenox             -antiplatelet therapy: NA 3. Pain Management: PRN medications. Lidocaine patch ordered for left shoulder. Pain better controlled.   3/13- pt appeared to keep pointing at legs like they hurt- will try Lyrica 25 mg BID- if has side effects, will stop-  3/14- too sedated this AM- will stop all med changes made.    3/18: attention has improved.  4. Mood: Team support             -antipsychotic agents: NA 5. Neuropsych: This patient is not capable of making decisions on his own behalf.  Telesitter for safety  See  #14  Appreciate neuropsych eval 6. Skin/Wound Care: Routine pressure relief measures.  7. Fluids/Electrolytes/Nutrition: Monitor I/O.   Comfort feeds, high aspiration risk, wife is aware of pneumonia risk.  Currently no signs or symptoms of this Has IV hydration 8. Hypothyroid: Continue Synthroid 14mcg. 9. Epileptogenic potential: Conitnue Keppra 500 mg bid for seizure  No seizures from admission-3/12 10. Hypokalemia: Resolved 11. Macrocytic anemia:   Hemoglobin 10.0 on 3/12, labs ordered for Monday  Vitamin B12 within normal limits  Folate WNL  Hemoccult remains pending, discussed   Continue to monitor 12.  Hypoalbuminemia  Supplement initiated on 3/4 13.  Severe Oral Dysphagia and mild to moderate pharyngeal dysphagia:  D1, honey thick liquids with supplemental IV fluids, monitor for signs of aspiration pneumonia   14. Lethargy/decreased attention: Medications reviewed and he is not taking any overly sedating medications.   Sleep chart ordered, consider meds accordingly  3/14- pt sedated this AM- likely hangover from Lyrica 25 mg BID or trazodone 25 mg QHS  or both- will stop both  3/15: Still overly sedated. DC melatonin and zyprexa.  15. Severe kyphosis: position with pillows when sleeping and sitting in chair, placed nursing order.  16.  Labile blood pressure  Controlled on 3/12  3/16: Hypotensive. Not on any BP medications. Ritalin will possibly provide some boost to BP.  17. Insomnia  3/13- sleep chart showed didn't sleep well last night- will try trazodone 25 mg QHS at most-   3/14- will d/c trazodone- might benefit from melatonin, but will let Dr Posey Pronto address Monday  Avoid sleep agents at this time since they overly sedate patient. Wife states that when patient had Ritalin in past it improved his attention during the day and lessened his sundowning at night. Will trial low dose 5mg  today.  18. Hypokalemia  3/13- K+ 3.2- will replete 40 mEq x2 and recheck Monday.   3/14-  labs scheduled for Monday.   3/15: 3.9, stable.  19. Impaired cognition secondary to dementia and encephalitis: Head CT today. EEG performed today and shows improvement since 3/1 EEG.  3/20 MRI showed chronic small vessel disease no new infarcts or other acute abnormalities, neuro hospitalist has signed off 20. Disposition: Had long discussion with patient and his wife yesterday afternoon. All sedating medication discontinued and will trial Ritalin again to stimulate during the day.    3/17: I will call wife to update on progress today. He is currently NPO and will need to discuss with her whether she would like to proceed with a modifed barium study tomorrow and whether she would want to consider PEG for him.   3/19: Family meeting today at noon, son, 2 daughters, wife, and patient participated and agreed on accepting the risk of aspiration pneumonia and offering patient po for comfort and pleasure. Restarted D1 honey thick diet. LOS: 17 days A FACE TO FACE EVALUATION WAS PERFORMED  Charlett Blake 11/04/2019, 2:26 PM

## 2019-11-04 NOTE — Progress Notes (Signed)
Pt has returned to the unit from MRI

## 2019-11-04 NOTE — Progress Notes (Signed)
Pt is currently off the unit for MRI

## 2019-11-04 NOTE — Progress Notes (Signed)
2200: Pt restless this evening. Tele sitter has alerted staff multiple times of pt's attempts to exit bed. Attempted to have pt sit in wheelchair, however pt noncompliant with wearing lap belt alarm. Pt placed back in bed and Charge Nurse sat with pt in room until he calmed down.  0000: Pt still makes multiple attempts at pulling lines and exiting bed. Staff alerted by tele sitter. Staff has reinforced need for pt adhere to safety plan, but pt noncompliant.   2: RN currently sitting in pt's room due to inc restlessness.  0120: Pt has now fallen asleep.    Per Provider's note and discussion w/ spouse, pt to not receive any sleep agents due to heavy sedation.

## 2019-11-04 NOTE — Progress Notes (Signed)
Pt was restless from shift change until almost midnight. Received order for valium from the on-call MD for MRI. Pt has been sleeping since midnight after he received Valium. MRI was completed successfully. No discomfort or respiratory distress noted. Will continue monitoring pt closely.

## 2019-11-04 NOTE — Plan of Care (Signed)
  Problem: RH BOWEL ELIMINATION Goal: RH STG MANAGE BOWEL WITH ASSISTANCE Description: STG Manage Bowel with Assistance. Mod  Outcome: Progressing   Problem: RH BLADDER ELIMINATION Goal: RH STG MANAGE BLADDER WITH ASSISTANCE Description: STG Manage Bladder With Assistance. Mod Outcome: Progressing   Problem: RH SKIN INTEGRITY Goal: RH STG MAINTAIN SKIN INTEGRITY WITH ASSISTANCE Description: STG Maintain Skin Integrity With Assistance. Mod Outcome: Progressing   Problem: RH SAFETY Goal: RH STG ADHERE TO SAFETY PRECAUTIONS W/ASSISTANCE/DEVICE Description: STG Adhere to Safety Precautions With Assistance/Device. Mod Outcome: Progressing   Problem: RH PAIN MANAGEMENT Goal: RH STG PAIN MANAGED AT OR BELOW PT'S PAIN GOAL Description: Less than 3 Outcome: Progressing

## 2019-11-04 NOTE — Progress Notes (Signed)
No stroke on MRI.   At this point, would continue supportive care, no new neurodiagnostics at this time.   Please call with further questions or concerns.   Roland Rack, MD [facility] [phone]  If 7pm- 7am, please page neurology on call as listed in [place].

## 2019-11-05 ENCOUNTER — Inpatient Hospital Stay (HOSPITAL_COMMUNITY): Payer: Medicare Other | Admitting: Occupational Therapy

## 2019-11-05 NOTE — Progress Notes (Signed)
[place] PHYSICAL MEDICINE & REHABILITATION PROGRESS NOTE  Subjective/Complaints: Patient walked over 200 feet with therapy Language of confusion persists  ROS: sedated, cannot due to cognition  Objective: Vital Signs: Blood pressure (!) 123/55, pulse 72, temperature 98.4 F (36.9 C), temperature source Oral, resp. rate 19, height 5\' 4"  (1.626 m), weight 50.8 kg, SpO2 98 %. MR BRAIN WO CONTRAST  Result Date: 11/04/2019 CLINICAL DATA:  Stroke follow-up EXAM: MRI HEAD WITHOUT CONTRAST TECHNIQUE: Multiplanar, multiecho pulse sequences of the brain and surrounding structures were obtained without intravenous contrast. COMPARISON:  Brain MRI 10/15/2019 FINDINGS: Brain: No acute infarct, acute hemorrhage or extra-axial collection. Early confluent hyperintense T2-weighted signal of the periventricular and deep white matter, most commonly due to chronic ischemic microangiopathy. Unchanged hyperintensity in the anterior left temporal lobe. There is generalized atrophy without lobar predilection. No chronic microhemorrhage. Normal midline structures. Vascular: Normal flow voids. Skull and upper cervical spine: Normal marrow signal. Sinuses/Orbits: Negative. Other: None. IMPRESSION: 1. No acute intracranial abnormality. 2. Unchanged findings of chronic ischemic microangiopathy and generalized atrophy. Electronically Signed   By: Ulyses Jarred M.D.   On: 11/04/2019 01:53   Recent Labs    11/04/19 0401  WBC 9.2  HGB 9.1*  HCT 26.7*  PLT 245   Recent Labs    11/04/19 1040  NA 135  K 3.0*  CL 101  CO2 26  GLUCOSE 123*  BUN 12  CREATININE 0.80  CALCIUM 8.6*    Physical Exam: BP (!) 123/55 (BP Location: Left Arm)   Pulse 72   Temp 98.4 F (36.9 C) (Oral)   Resp 19   Ht 5\' 4"  (1.626 m)   Wt 50.8 kg   SpO2 98%   BMI 19.21 kg/m    General: No acute distress Mood and affect are appropriate Heart: Regular rate and rhythm no rubs murmurs or extra sounds Lungs: Clear to auscultation,  breathing unlabored, no rales or wheezes Abdomen: Positive bowel sounds, soft nontender to palpation, nondistended Extremities: No clubbing, cyanosis, or edema Skin: No evidence of breakdown, no evidence of rash Neurologic: , motor strength is 4/5 in bilateral deltoid, bicep, tricep, grip, hip flexor, knee extensors, ankle dorsiflexor and plantar flexor  Musculoskeletal: Full range of motion in all 4 extremities. No joint swelling  Musculoskeletal: Thoracic kyphosis  Assessment/Plan: 1. 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  Physiatrist is providing close team supervision and 24 hour management of active medical problems listed below.  Physiatrist and rehab team continue to assess barriers to discharge/monitor patient progress toward functional and medical goals  Care Tool:  Bathing    Body parts bathed by patient: Right arm, Left arm, Chest, Abdomen, Face, Left upper leg, Right upper leg   Body parts bathed by helper: Left lower leg, Right lower leg, Front perineal area, Buttocks     Bathing assist Assist Level: Minimal Assistance - Patient > 75%     Upper Body Dressing/Undressing Upper body dressing   What is the patient wearing?: Pull over shirt    Upper body assist Assist Level: Minimal Assistance - Patient > 75%    Lower Body Dressing/Undressing Lower body dressing      What is the patient wearing?: Pants     Lower body assist Assist for lower body dressing: Maximal Assistance - Patient 25 - 49%     Toileting Toileting    Toileting assist Assist for toileting: Maximal Assistance -  Patient 25 - 49%     Transfers Chair/bed transfer  Transfers assist     Chair/bed transfer assist level: Minimal Assistance - Patient > 75%     Locomotion Ambulation   Ambulation assist      Assist level: 2 helpers Assistive device:  Walker-rolling Max distance: 25ft   Walk 10 feet activity   Assist     Assist level: 2 helpers Assistive device: Walker-rolling   Walk 50 feet activity   Assist Walk 50 feet with 2 turns activity did not occur: Safety/medical concerns(fatigued)  Assist level: 2 helpers Assistive device: Walker-rolling    Walk 150 feet activity   Assist Walk 150 feet activity did not occur: Safety/medical concerns(fatigued)         Walk 10 feet on uneven surface  activity   Assist Walk 10 feet on uneven surfaces activity did not occur: Safety/medical concerns(fatigued)         Wheelchair     Assist Will patient use wheelchair at discharge?: Yes Type of Wheelchair: Manual    Wheelchair assist level: Dependent - Patient 0%      Wheelchair 50 feet with 2 turns activity    Assist        Assist Level: Dependent - Patient 0%   Wheelchair 150 feet activity     Assist     Assist Level: Dependent - Patient 0%      Medical Problem List and Plan: 1.  Cognitive deficits with unintelligible language, weakness, secondary to herpes encephalitis.  Continue CIR             Continue IV acyclovir until 3/21, appreciate pharm consult.  Team conference Wednesday; DC planned for 3/27.  2.  Antithrombotics: -DVT/anticoagulation:  Pharmaceutical: Lovenox             -antiplatelet therapy: NA 3. Pain Management: PRN medications. Lidocaine patch ordered for left shoulder. Pain better controlled.   3/13- pt appeared to keep pointing at legs like they hurt- will try Lyrica 25 mg BID- if has side effects, will stop-  3/14- too sedated this AM- will stop all med changes made.    3/18: attention has improved.  4. Mood: Team support             -antipsychotic agents: NA 5. Neuropsych: This patient is not capable of making decisions on his own behalf.  Telesitter for safety  See #14  Appreciate neuropsych eval 6. Skin/Wound Care: Routine pressure relief measures.  7.  Fluids/Electrolytes/Nutrition: Monitor I/O.   Comfort feeds, high aspiration risk, wife is aware of pneumonia risk.  Currently no signs or symptoms of this Has IV hydration, at some point will need to discontinue this to see if he can maintain hydration p.o. 8. Hypothyroid: Continue Synthroid 143mcg. 9. Epileptogenic potential: Conitnue Keppra 500 mg bid for seizure  No seizures from admission-3/12 10. Hypokalemia: Resolved 11. Macrocytic anemia:   Hemoglobin 10.0 on 3/12, labs ordered for Monday  Vitamin B12 within normal limits  Folate WNL  Hemoccult remains pending, discussed   Continue to monitor 12.  Hypoalbuminemia  Supplement initiated on 3/4 13.  Severe Oral Dysphagia and mild to moderate pharyngeal dysphagia:  D1, honey thick liquids with supplemental IV fluids, monitor for signs of aspiration pneumonia   14. Lethargy/decreased attention: Medications reviewed and he is not taking any overly sedating medications.   Sleep chart ordered, consider meds accordingly  3/14- pt sedated this AM- likely hangover from Lyrica 25 mg BID or trazodone 25 mg  QHS or both- will stop both  3/15: Still overly sedated. DC melatonin and zyprexa.  15. Severe kyphosis: position with pillows when sleeping and sitting in chair, placed nursing order.  16.  Labile blood pressure   Vitals:   11/04/19 2026 11/05/19 0556  BP: 123/63 (!) 123/55  Pulse: 88 72  Resp: 18 19  Temp: 97.6 F (36.4 C) 98.4 F (36.9 C)  SpO2: 100% 98%  Controlled 17. Insomnia  3/13- sleep chart showed didn't sleep well last night- will try trazodone 25 mg QHS at most-   3/14- will d/c trazodone- might benefit from melatonin, but will let Dr Posey Pronto address Monday  Avoid sleep agents at this time since they overly sedate patient. Wife states that when patient had Ritalin in past it improved his attention during the day and lessened his sundowning at night. Will trial low dose 5mg  today.  18. Hypokalemia  3/13- K+ 3.2- will  replete 40 mEq x2 and recheck Monday.   3/14- labs scheduled for Monday.   3/15: 3.9, stable.  19. Impaired cognition secondary to dementia and encephalitis: Head CT today. EEG performed today and shows improvement since 3/1 EEG.  3/20 MRI showed chronic small vessel disease no new infarcts or other acute abnormalities, neuro hospitalist has signed off 20. Disposition: Had long discussion with patient and his wife yesterday afternoon. All sedating medication discontinued and will trial Ritalin again to stimulate during the day.    Comfort feeds.    D1 honey thick diet.  IV fluids for hydration LOS: 18 days A FACE TO FACE EVALUATION WAS PERFORMED  Charlett Blake 11/05/2019, 2:53 PM

## 2019-11-05 NOTE — Progress Notes (Signed)
Occupational Therapy Weekly Progress Note  Patient Details  Name: Eric Larsen MRN: [id number] Date of Birth: 12/27/1930  Beginning of progress report period: October 27, 2019 End of progress report period: November 05, 2019  Today's Date: 11/05/2019 OT Individual Time: 8756-4332 OT Individual Time Calculation (min): 54 min    Patient has met 3 of 5 short term goals.  Pt is making slow progress towards goals.  Pt currently requires increased time for initiation and sequencing of self-care tasks and functional mobility.  Pt has demonstrated ability to complete transfers and ambulation with Min assist.  Pt continues to fluctuate in arousal and therefore participation in therapy sessions, however towards the end of this reporting period pt has been more consistent.  Pt is able to bathe at min-mod assist level, UB dressing at min assist and LB dressing at max assist level.  Pt continues to have language of confusion but is overall more alert and participatory.  Patient continues to demonstrate the following deficits: muscle weakness,decreased midline orientation,decreased initiation, decreased attention, decreased awareness, decreased problem solving, decreased safety awareness, decreased memory and delayed processingand decreased sitting balance, decreased standing balance, decreased postural control and decreased balance strategies  and therefore will continue to benefit from skilled OT intervention to enhance overall performance with BADL and Reduce care partner burden.  Patient progressing toward long term goals..  Continue plan of care.  OT Short Term Goals Week 2:  OT Short Term Goal 1 (Week 2): Pt will maintain static sitting balance with supervision for at least 3 mins in perparation for selfcare tasks. OT Short Term Goal 1 - Progress (Week 2): Met OT Short Term Goal 2 (Week 2): Pt will complete UB bathing with no more than min instructional cueing for initiation and sequencing. OT Short  Term Goal 2 - Progress (Week 2): Progressing toward goal OT Short Term Goal 3 (Week 2): Pt will donn a pullover shirt with no more than min assist in supported sitting for two consecutive trials. OT Short Term Goal 3 - Progress (Week 2): Met OT Short Term Goal 4 (Week 2): Pt will complete toilet transfers with min assist for two consecutive trials OT Short Term Goal 4 - Progress (Week 2): Met OT Short Term Goal 5 (Week 2): Pt will complete LB dressing with mod assist at sit > stand level OT Short Term Goal 5 - Progress (Week 2): Progressing toward goal Week 3:  OT Short Term Goal 1 (Week 3): STG = LTGs due to remaining ELOS  Skilled Therapeutic Interventions/Progress Updates:    Treatment session with focus on increased participation in eating activity and functional mobility.  Pt received upright in w/c with pt's wife feeding him.  [name] reports that she prefers to assist with feeding to ensure safe bite size and that he clears mouth before next bite. Therapist discussed with wife various adaptive utensils, even showing her some online as he had not been successful with angled utensils with large handles.  Will continue to assess.  Pt more alert this session and agreeable to ambulation.  Pt ambulated 52' with RW with min assist for hand placement and then min A- CGA for upright posture and mod multimodal cues for upright posture.  Pt walked additional 95' and then 1' as above, demonstrating overall increased gait speed and step length.  Pt continues to have language of confusion, but more expressive and agreeable throughout session.  Pt remained upright in w/c with seat belt alarm on and all needs in  reach.  Therapy Documentation Precautions:  Precautions Precautions: Fall Precaution Comments: dementia, slow processing/initiation/poor attention Restrictions Weight Bearing Restrictions: No Pain:  Pt with no c/o pain   Therapy/Group: Individual Therapy  [name] 11/05/2019, 3:12 PM

## 2019-11-06 ENCOUNTER — Inpatient Hospital Stay (HOSPITAL_COMMUNITY): Payer: Medicare Other | Admitting: Speech Pathology

## 2019-11-06 ENCOUNTER — Inpatient Hospital Stay (HOSPITAL_COMMUNITY): Payer: Medicare Other

## 2019-11-06 ENCOUNTER — Inpatient Hospital Stay (HOSPITAL_COMMUNITY): Payer: Medicare Other | Admitting: Occupational Therapy

## 2019-11-06 DIAGNOSIS — G479 Sleep disorder, unspecified: Secondary | ICD-10-CM

## 2019-11-06 DIAGNOSIS — F0280 Dementia in other diseases classified elsewhere without behavioral disturbance: Secondary | ICD-10-CM

## 2019-11-06 MED ORDER — POTASSIUM CHLORIDE CRYS ER 20 MEQ PO TBCR
40.0000 meq | EXTENDED_RELEASE_TABLET | Freq: Two times a day (BID) | ORAL | Status: AC
  Administered 2019-11-06 (×2): 40 meq via ORAL
  Filled 2019-11-06 (×2): qty 2

## 2019-11-06 MED ORDER — LEVETIRACETAM 500 MG PO TABS
500.0000 mg | ORAL_TABLET | Freq: Two times a day (BID) | ORAL | Status: DC
  Administered 2019-11-06 – 2019-11-16 (×20): 500 mg via ORAL
  Filled 2019-11-06 (×22): qty 1

## 2019-11-06 MED ORDER — FOLIC ACID 1 MG PO TABS
1.0000 mg | ORAL_TABLET | Freq: Every day | ORAL | Status: DC
  Administered 2019-11-07 – 2019-11-17 (×11): 1 mg via ORAL
  Filled 2019-11-06 (×11): qty 1

## 2019-11-06 MED ORDER — NON FORMULARY: 1.5000 mg | Freq: Every day | Status: DC

## 2019-11-06 MED ORDER — MELATONIN 3 MG PO TABS
1.5000 mg | ORAL_TABLET | Freq: Every day | ORAL | Status: DC
  Administered 2019-11-06: 1.5 mg via ORAL
  Filled 2019-11-06: qty 1

## 2019-11-06 MED ORDER — LEVOTHYROXINE SODIUM 25 MCG PO TABS
125.0000 ug | ORAL_TABLET | Freq: Every day | ORAL | Status: DC
  Administered 2019-11-07 – 2019-11-16 (×10): 125 ug via ORAL
  Filled 2019-11-06 (×10): qty 1

## 2019-11-06 NOTE — Progress Notes (Signed)
Pt attempts to get out of bed, throw pillows in the floor and pull the IV tubing. Pt continues to talk to one self and this nurse,  having moments of rambling "words salad". pts states he knows he is sleepy and needs to sleep but doesn't understand why he cant take anything to help him sleep. Pt has slept about 1 hour since 7 am. Pt is requiring  1 on 1 supervision to help maintain safe environment.

## 2019-11-06 NOTE — Progress Notes (Signed)
[facility] PHYSICAL MEDICINE & REHABILITATION PROGRESS NOTE  Subjective/Complaints: Patient seen laying in bed this morning.  Per nursing and sleep chart patient did not sleep well at all overnight.  He is much more alert and able to engage, however continues to be confused.  ROS: Unable to accurately assess due to cognition.  Objective: Vital Signs: Blood pressure 116/79, pulse 75, temperature (!) 97.5 F (36.4 C), temperature source Oral, resp. rate 19, height 5\' 4"  (1.626 m), weight 50.8 kg, SpO2 99 %. No results found. Recent Labs    11/04/19 0401  WBC 9.2  HGB 9.1*  HCT 26.7*  PLT 245   Recent Labs    11/04/19 1040  NA 135  K 3.0*  CL 101  CO2 26  GLUCOSE 123*  BUN 12  CREATININE 0.80  CALCIUM 8.6*    Physical Exam: BP 116/79 (BP Location: Left Arm)   Pulse 75   Temp (!) 97.5 F (36.4 C) (Oral)   Resp 19   Ht 5\' 4"  (1.626 m)   Wt 50.8 kg   SpO2 99%   BMI 19.21 kg/m  Constitutional: No distress . Vital signs reviewed. HENT: Normocephalic.  Atraumatic. Eyes: EOMI. No discharge. Cardiovascular: No JVD. Respiratory: Normal effort.  No stridor. GI: Non-distended. Skin: Warm and dry.  Intact. Psych: Confused. Musc: No edema in extremities.  No tenderness in extremities. Neurologic: Alert and oriented x1 Motor: Grossly 4+/5 throughout  Assessment/Plan: 1. 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  Physiatrist is providing close team supervision and 24 hour management of active medical problems listed below.  Physiatrist and rehab team continue to assess barriers to discharge/monitor patient progress toward functional and medical goals  Care Tool:  Bathing    Body parts bathed by patient: Right arm, Left arm, Chest, Abdomen, Face, Left upper leg, Right upper leg   Body parts bathed by helper: Left lower leg, Right lower leg, Front perineal  area, Buttocks     Bathing assist Assist Level: Minimal Assistance - Patient > 75%     Upper Body Dressing/Undressing Upper body dressing   What is the patient wearing?: Pull over shirt    Upper body assist Assist Level: Minimal Assistance - Patient > 75%    Lower Body Dressing/Undressing Lower body dressing      What is the patient wearing?: Pants     Lower body assist Assist for lower body dressing: Maximal Assistance - Patient 25 - 49%     Toileting Toileting    Toileting assist Assist for toileting: Maximal Assistance - Patient 25 - 49%     Transfers Chair/bed transfer  Transfers assist     Chair/bed transfer assist level: Minimal Assistance - Patient > 75%     Locomotion Ambulation   Ambulation assist      Assist level: 2 helpers Assistive device: Walker-rolling Max distance: 16ft   Walk 10 feet activity   Assist     Assist level: 2 helpers Assistive device: Walker-rolling   Walk 50 feet activity   Assist Walk 50 feet with 2 turns activity did not occur: Safety/medical concerns(fatigued)  Assist level: 2 helpers Assistive device: Walker-rolling    Walk 150 feet activity   Assist Walk 150 feet activity did not occur: Safety/medical concerns(fatigued)         Walk 10 feet on uneven surface  activity   Assist Walk 10 feet on uneven surfaces activity  did not occur: Safety/medical concerns(fatigued)         Wheelchair     Assist Will patient use wheelchair at discharge?: Yes Type of Wheelchair: Manual    Wheelchair assist level: Dependent - Patient 0%      Wheelchair 50 feet with 2 turns activity    Assist        Assist Level: Dependent - Patient 0%   Wheelchair 150 feet activity     Assist     Assist Level: Dependent - Patient 0%      Medical Problem List and Plan: 1.  Cognitive deficits with unintelligible language, weakness, secondary to herpes encephalitis.  Continue CIR              Completed course of IV acyclovir on 3/21  2.  Antithrombotics: -DVT/anticoagulation:  Pharmaceutical: Lovenox             -antiplatelet therapy: NA 3. Pain Management: PRN medications. Lidocaine patch ordered for left shoulder.    Appears controlled on 3/22 4. Mood: Team support  Methylphenidate 5 mg daily started on 3/20             -antipsychotic agents: NA 5. Neuropsych: This patient is not capable of making decisions on his own behalf.  Telesitter for safety  See #14  Appreciate neuropsych eval 6. Skin/Wound Care: Routine pressure relief measures.  7. Fluids/Electrolytes/Nutrition: Monitor I/O.  8. Hypothyroid: Continue Synthroid 159mcg. 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 Hypokalemia:   Potassium 3.0 on 3/20  Supplemented x1 day, labs ordered for tomorrow 11. Macrocytic anemia:   Hemoglobin 9.1 on 3/20, labs ordered for tomorrow  Vitamin B12 within normal limits  Folate WNL  Hemoccult negative  Continue to monitor 12.  Hypoalbuminemia  Supplement initiated on 3/4 13.  Severe Oral Dysphagia and mild to moderate pharyngeal dysphagia:   D1 honey thick liquids  Suplemental IV fluids, will consider DC after labs 14. Lethargy/decreased attention: Medications reviewed and he is not taking any overly sedating medications.   Sleep chart ordered, consider meds accordingly  Melatonin reordered on 3/22 15. Severe kyphosis: position with pillows when sleeping and sitting in chair, placed nursing order.  16.  Labile blood pressure   Vitals:   11/05/19 2051 11/06/19 0254  BP: 114/76 116/79  Pulse: 88 75  Resp: 18 19  Temp: 98.9 F (37.2 C) (!) 97.5 F (36.4 C)  SpO2: 96% 99%   Controlled on 3/22 17.  Sleep disturbance  See #14 18. Impaired cognition secondary to dementia and encephalitis:   Repeat EEG with some improvement  MRI on 3/20 stable   LOS: 19 days A FACE TO FACE EVALUATION WAS  PERFORMED  Axl Rodino Lorie Phenix 11/06/2019, 12:03 PM

## 2019-11-06 NOTE — Progress Notes (Signed)
Occupational Therapy Session Note  Patient Details  Name: Eric Larsen MRN: [id number] Date of Birth: 19-Nov-1930  Today's Date: 11/06/2019 OT Individual Time: [id number] OT Individual Time Calculation (min): 70 min    Short Term Goals: Week 3:  OT Short Term Goal 1 (Week 3): STG = LTGs due to remaining ELOS  Skilled Therapeutic Interventions/Progress Updates:    Treatment session with focus on function mobility and transfers in home environment.  Pt received upright in w/c with wife assisting with feeding.  Pt with increased intake and arousal this session.  Agreeable to therapy session.  [name] provided +2 assist with management of IV pole during mobility in ADL apt.  Engaged in furniture transfers, bed mobility, and toilet transfers in ADL apt with all mobility CGA with RW, requiring min up to mod assist sit > stand from low couch surface.  Pt completed bed mobility with CGA transferring in and out of regular bed.  CGA for transfers on/off BSC over toilet.  Pt required increased time and cues for sequencing and turning in ADL apt.  Discussed modifications of home environment to increase success with sit > stand from lower surfaces.  Engaged in discussion with pt, pt's wife, and pt's daughter regarding d/c plans with continued recommendation for additional therapy at SNF level to allow for continued improvement and to provide the level of care that he requires.  [name] in agreement, stating that she feels he requires more than she can provide 24/7.  [name] and pt's daughter pleased with pt's progress and continue to be hopeful for additional progress to allow for d/c home.  Therapy Documentation Precautions:  Precautions Precautions: Fall Precaution Comments: dementia, slow processing/initiation/poor attention Restrictions Weight Bearing Restrictions: No General:   Vital Signs: Therapy Vitals Temp: 98 F (36.7 C) Temp Source: Oral Pulse Rate: 92 Resp: (!) 22 BP: (!)  115/96 Patient Position (if appropriate): Sitting Oxygen Therapy SpO2: (!) 77 % O2 Device: Room Air Pain:  Pt with no c/o pain   Therapy/Group: Individual Therapy  [name] 11/06/2019, 3:39 PM

## 2019-11-06 NOTE — Progress Notes (Addendum)
Speech Language Pathology Daily Session Note  Patient Details  Name: Eric Larsen MRN: [id number] Date of Birth: 1931/05/13  Today's Date: 11/06/2019 SLP Individual Time: 0900-1000; 1135-1210 SLP Individual Time Calculation (min): 60 min; 35 min  Short Term Goals: Week 3: SLP Short Term Goal 1 (Week 3): Pt will focus attention to basic task for ~ 1 minute with Max A multimodal cues for redirection. SLP Short Term Goal 2 (Week 3): Pt will folow 1 step very basic direction in 5 out of 10 opportunities with Mod A cues. SLP Short Term Goal 3 (Week 3): Pt will read information on calendar to state date and location with Max A cues.  Skilled Therapeutic Interventions:  Skilled treatment sesison trageted caregiver educaiton with pt's daughter Jonelle Sidle) who is present and his daughter in [place] via facetime. On several occasions, pt's wife has requested SLP to show his daughters pt's most recent MBS and explain current clinical information. This Probation officer provided information regarding overall cognitive funciton, reviewed MBS images, rationale for current recommendations and all questions were answered to their satisfaction at this time.   Pt initially awake for ~ 5 minutes at beginning of session and required Total assistance to recognize his daughter but was not able to state her name or interact with her within their familial relationship.   Skilled treatment session #2 focused on further caregiver education with pt's daughter Jonelle Sidle, [name] and [name] via Facetime. Education focused on cognitive deficits, current STG/LTGs, therapy activities, cue level and all questions answered at this time.        Pain    Therapy/Group: Individual Therapy  Makaila Windle 11/06/2019, 10:37 AM

## 2019-11-06 NOTE — Progress Notes (Signed)
Physical Therapy Session Note  Patient Details  Name: Eric Larsen MRN: [id number] Date of Birth: [birth year]  Today's Date: 11/06/2019 PT Individual Time: 0800-0859 PT Individual Time Calculation (min): 59 min   Short Term Goals: Week 2:  PT Short Term Goal 1 (Week 2): Will be able to perform bed mobility mod A PT Short Term Goal 1 - Progress (Week 2): Progressing toward goal PT Short Term Goal 2 (Week 2): Pt will perform bed<>chair transfer with LRAD mod A PT Short Term Goal 2 - Progress (Week 2): Progressing toward goal PT Short Term Goal 3 (Week 2): Pt will transfer sit<>stand with LRAD min A PT Short Term Goal 3 - Progress (Week 2): Met Week 3:  PT Short Term Goal 1 (Week 3): Will be able to perform bed mobility consistantly with mod A PT Short Term Goal 2 (Week 3): Pt will perform bed<>chair transfer with LRAD consistantly with mod A PT Short Term Goal 3 (Week 3): Pt will perform car transfer with LRAD mod A  Skilled Therapeutic Interventions/Progress Updates:   Received pt supine in bed, pt agreeable to therapy, and denied any pain during session. Session focused on functional mobility/transfers, dressing, LE strength, dynamic standing balance/coordination, ambulation, and improved endurance with activity. Pt with soiled brief. Pt required max A to doff soiled brief, total assist to perform peri-care, mod A to roll to L and R, and max A to don clean incontinence brief and pull pants over hips. Pt transferred supine<>sitting EOB with CGA and increased time. Pt transferred stand<>pivot with RW min A. Pt transported to gym in [facility] total assist. Pt transferred stand<>pivot WC<>mat with RW min A. Pt ambulated 50f x 2 trials with RW min A with increased time. Pt required verbal cues for upright posture, RW safety, and stepping sequence. Pt required rest breaks throughout session due to increased fatigue. Pt performed standing alternating toe taps to 3in step x15 reps with bilateral UE support  on RW CGA. Pt continues to require mod/max verbal cues for technique and sequencing. Noted pt had BM in brief and transferred stand<>pivot mat<>WC with RW min A. [facility]. Pt transferred WC<>bed with RW min A and sit<>supine with CGA/min A. Concluded session with pt supine in bed, needs within reach, and NT present to attend to personal care needs. Pt's daughter present at bedside.   Therapy Documentation Precautions:  Precautions Precautions: Fall Precaution Comments: dementia, slow processing/initiation/poor attention Restrictions Weight Bearing Restrictions: No  Therapy/Group: Individual Therapy AAlfonse AlpersPT, DPT   11/06/2019, 7:27 AM

## 2019-11-07 ENCOUNTER — Inpatient Hospital Stay (HOSPITAL_COMMUNITY): Payer: Medicare Other

## 2019-11-07 ENCOUNTER — Inpatient Hospital Stay (HOSPITAL_COMMUNITY): Payer: Medicare Other | Admitting: Occupational Therapy

## 2019-11-07 ENCOUNTER — Inpatient Hospital Stay (HOSPITAL_COMMUNITY): Payer: Medicare Other | Admitting: Speech Pathology

## 2019-11-07 LAB — CBC WITH DIFFERENTIAL/PLATELET
Abs Immature Granulocytes: 0.09 10*3/uL — ABNORMAL HIGH (ref 0.00–0.07)
Basophils Absolute: 0 10*3/uL (ref 0.0–0.1)
Basophils Relative: 1 %
Eosinophils Absolute: 0.1 10*3/uL (ref 0.0–0.5)
Eosinophils Relative: 2 %
HCT: 25.7 % — ABNORMAL LOW (ref 39.0–52.0)
Hemoglobin: 8.7 g/dL — ABNORMAL LOW (ref 13.0–17.0)
Immature Granulocytes: 1 %
Lymphocytes Relative: 13 %
Lymphs Abs: 0.8 10*3/uL (ref 0.7–4.0)
MCH: 36.1 pg — ABNORMAL HIGH (ref 26.0–34.0)
MCHC: 33.9 g/dL (ref 30.0–36.0)
MCV: 106.6 fL — ABNORMAL HIGH (ref 80.0–100.0)
Monocytes Absolute: 0.6 10*3/uL (ref 0.1–1.0)
Monocytes Relative: 10 %
Neutro Abs: 4.6 10*3/uL (ref 1.7–7.7)
Neutrophils Relative %: 73 %
Platelets: 242 10*3/uL (ref 150–400)
RBC: 2.41 MIL/uL — ABNORMAL LOW (ref 4.22–5.81)
RDW: 26.7 % — ABNORMAL HIGH (ref 11.5–15.5)
WBC: 6.3 10*3/uL (ref 4.0–10.5)
nRBC: 0 % (ref 0.0–0.2)

## 2019-11-07 LAB — BASIC METABOLIC PANEL
Anion gap: 5 (ref 5–15)
BUN: 8 mg/dL (ref 8–23)
CO2: 27 mmol/L (ref 22–32)
Calcium: 8.8 mg/dL — ABNORMAL LOW (ref 8.9–10.3)
Chloride: 105 mmol/L (ref 98–111)
Creatinine, Ser: 0.74 mg/dL (ref 0.61–1.24)
GFR calc Af Amer: >60 mL/min (ref >60)
GFR calc non Af Amer: >60 mL/min (ref >60)
Glucose, Bld: 113 mg/dL — ABNORMAL HIGH (ref 70–99)
Potassium: 3.2 mmol/L — ABNORMAL LOW (ref 3.5–5.1)
Sodium: 137 mmol/L (ref 135–145)

## 2019-11-07 MED ORDER — MELATONIN 3 MG PO TABS
3.0000 mg | ORAL_TABLET | Freq: Every day | ORAL | Status: DC
  Administered 2019-11-07 – 2019-11-16 (×10): 3 mg via ORAL
  Filled 2019-11-07 (×10): qty 1

## 2019-11-07 MED ORDER — POTASSIUM CHLORIDE CRYS ER 20 MEQ PO TBCR
30.0000 meq | EXTENDED_RELEASE_TABLET | Freq: Every day | ORAL | Status: DC
  Administered 2019-11-07 – 2019-11-09 (×3): 30 meq via ORAL
  Filled 2019-11-07 (×3): qty 1

## 2019-11-07 MED ORDER — POTASSIUM CHLORIDE CRYS ER 20 MEQ PO TBCR
40.0000 meq | EXTENDED_RELEASE_TABLET | Freq: Once | ORAL | Status: AC
  Administered 2019-11-07: 40 meq via ORAL
  Filled 2019-11-07: qty 2

## 2019-11-07 NOTE — Progress Notes (Signed)
Telesitter has notified staff multiple times about pt trying to get up. Pt found sitting at the foot of the bed.  Pt continues to take all of his clothes off and throw all linen in the floor. Pt is  Requiring 1 on 1 care and is now sitting in the recliner at the nurses station with staff, belt alarm is on the pt at this time.

## 2019-11-07 NOTE — Progress Notes (Signed)
Occupational Therapy Session Note  Patient Details  Name: Eric Larsen MRN: [id number] Date of Birth: 09-21-1930  Today's Date: 11/07/2019 OT Individual Time: [id number] OT Individual Time Calculation (min): 63 min  and Today's Date: 11/07/2019 OT Missed Time: 13 Minutes Missed Time Reason: Patient fatigue   Short Term Goals: Week 3:  OT Short Term Goal 1 (Week 3): STG = LTGs due to remaining ELOS  Skilled Therapeutic Interventions/Progress Updates:    Pt initially sleeping soundly upon arrival.  Discussed with RN who reports pt not falling asleep until ~500 and getting 1-2 hours of sleep per night.  Returned to pt awake with nurse tech in room to assess toileting needing.  Pt agreeable to therapy session.  Completed bed mobility with increased time and requiring mod assist and increased cues due to fatigue as just waking up.  Min-mod assist sit > stand from EOB and stand pivot with RW to w/c with min assist.  Pt with improved sit > stand as session progressed and pt more alert.  Engaged in [place] bathing and UB/LB dressing tasks with increased time.  Pt able to complete UB dressing with min assist and min cues for sequencing.  Required assist to thread pants over bilateral feet but then pt able to pull pants over legs and over hips in standing with min assist for standing balance.  Pt more alert and engaged throughout session.  Engaged in self-feeding with focus on initiation, pt scooping all bites and safely brining to mouth.  Mod cues for portion size and to clear mouth between bites.  RN arrived at end of session to administer meds.  Pt left upright in w/c with seat belt alarm on and RN in room for meds and to supervise meal.    Therapy Documentation Precautions:  Precautions Precautions: Fall Precaution Comments: dementia, slow processing/initiation/poor attention Restrictions Weight Bearing Restrictions: No General: General OT Amount of Missed Time: 13 Minutes Pain:  Pt with no c/o  pain   Therapy/Group: Individual Therapy  [name] 11/07/2019, 8:45 AM

## 2019-11-07 NOTE — Progress Notes (Signed)
Speech Language Pathology Daily Session Note  Patient Details  Name: Eric Larsen MRN: [id number] Date of Birth: 1931/08/02  Today's Date: 11/07/2019 SLP Individual Time: [id number] SLP Individual Time Calculation (min): 25 min  Short Term Goals: Week 3: SLP Short Term Goal 1 (Week 3): Pt will focus attention to basic task for ~ 1 minute with Max A multimodal cues for redirection. SLP Short Term Goal 2 (Week 3): Pt will folow 1 step very basic direction in 5 out of 10 opportunities with Mod A cues. SLP Short Term Goal 3 (Week 3): Pt will read information on calendar to state date and location with Max A cues.  Skilled Therapeutic Interventions:  Skilled treatment session targeted pt's cognition goals. SLP facilitated session by providing Max A multimodal cues to locate month, year and location on calendar. Pt was able to be redirected to task for ~ 1 - to 2 minutes of sustained attention with Max A cues. Pt left upright in wheelchair, lap belt on and family present.      Pain    Therapy/Group: Individual Therapy  Kitt Minardi 11/07/2019, 3:08 PM

## 2019-11-07 NOTE — Progress Notes (Signed)
Telesitter has notified staff multiple times regarding the pulling at the IV line and attempting to get out of the bed. Pt is requiring 1 to 1 care. Pt is A & O x1 and doesn't not understand why he can not get out of the bed. Pt appears to be anxious and irritable. Pt denies pain at this time. No signs of distress noted. This nurse is currently at bedside with the pt.

## 2019-11-07 NOTE — Progress Notes (Signed)
Pt still requiring 1 on 1 with staff member at this time.  Alternate comfort measures attempted but unsuccessful. Pt attempting to get out of bed, taking clothes off and pulling at IV line. Pt states " I wish I could just sleep. isn't there something you can give me to help me sleep".  This staff member will remain at beside until pt is no longer trying to get up.

## 2019-11-07 NOTE — Progress Notes (Signed)
[facility] PHYSICAL MEDICINE & REHABILITATION PROGRESS NOTE  Subjective/Complaints: Patient seen laying in bed this morning.  He did not sleep well overnight per sleep chart.  He is lethargic this morning.  Noted to require significant attention and care overnight.  ROS: Unable to accurately assess due to cognition/lethargy.  Objective: Vital Signs: Blood pressure (!) 165/91, pulse (!) 105, temperature 97.8 F (36.6 C), temperature source Oral, resp. rate 18, height 5\' 4"  (1.626 m), weight 50.8 kg, SpO2 100 %. No results found. Recent Labs    11/07/19 0419  WBC 6.3  HGB 8.7*  HCT 25.7*  PLT 242   Recent Labs    11/04/19 1040 11/07/19 0419  NA 135 137  K 3.0* 3.2*  CL 101 105  CO2 26 27  GLUCOSE 123* 113*  BUN 12 8  CREATININE 0.80 0.74  CALCIUM 8.6* 8.8*    Physical Exam: BP (!) 165/91 (BP Location: Left Arm)   Pulse (!) 105   Temp 97.8 F (36.6 C) (Oral)   Resp 18   Ht 5\' 4"  (1.626 m)   Wt 50.8 kg   SpO2 100%   BMI 19.21 kg/m  Constitutional: No distress . Vital signs reviewed. HENT: Normocephalic.  Atraumatic. Eyes: EOMI. No discharge. Cardiovascular: No JVD. Respiratory: Normal effort.  No stridor. GI: Non-distended. Skin: Warm and dry.  Intact. Psych: Confused. Neurologic: Lethargic Motor: Grossly 4+/5 throughout, appears unchanged  Assessment/Plan: 1. 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  Physiatrist is providing close team supervision and 24 hour management of active medical problems listed below.  Physiatrist and rehab team continue to assess barriers to discharge/monitor patient progress toward functional and medical goals  Care Tool:  Bathing    Body parts bathed by patient: Right arm, Left arm, Chest, Abdomen, Face   Body parts bathed by helper: Front perineal area, Buttocks     Bathing assist Assist Level: Minimal  Assistance - Patient > 75%     Upper Body Dressing/Undressing Upper body dressing   What is the patient wearing?: Pull over shirt    Upper body assist Assist Level: Minimal Assistance - Patient > 75%    Lower Body Dressing/Undressing Lower body dressing      What is the patient wearing?: Incontinence brief, Pants     Lower body assist Assist for lower body dressing: Moderate Assistance - Patient 50 - 74%     Toileting Toileting    Toileting assist Assist for toileting: Maximal Assistance - Patient 25 - 49%     Transfers Chair/bed transfer  Transfers assist     Chair/bed transfer assist level: Minimal Assistance - Patient > 75%     Locomotion Ambulation   Ambulation assist      Assist level: 2 helpers Assistive device: Walker-rolling Max distance: 49ft   Walk 10 feet activity   Assist     Assist level: 2 helpers Assistive device: Walker-rolling   Walk 50 feet activity   Assist Walk 50 feet with 2 turns activity did not occur: Safety/medical concerns(fatigued)  Assist level: 2 helpers Assistive device: Walker-rolling    Walk 150 feet activity   Assist Walk 150 feet activity did not occur: Safety/medical concerns(fatigued)         Walk 10 feet on uneven surface  activity   Assist Walk 10 feet on uneven surfaces activity did not occur: Safety/medical concerns(fatigued)  Wheelchair     Assist Will patient use wheelchair at discharge?: Yes Type of Wheelchair: Manual    Wheelchair assist level: Dependent - Patient 0%      Wheelchair 50 feet with 2 turns activity    Assist        Assist Level: Dependent - Patient 0%   Wheelchair 150 feet activity     Assist     Assist Level: Dependent - Patient 0%      Medical Problem List and Plan: 1.  Cognitive deficits with unintelligible language, weakness, secondary to herpes encephalitis.  Continue CIR             Completed course of IV acyclovir on  3/21  Will discussed with ID 2.  Antithrombotics: -DVT/anticoagulation:  Pharmaceutical: Lovenox             -antiplatelet therapy: NA 3. Pain Management: PRN medications. Lidocaine patch ordered for left shoulder.    Appears controlled on 3/23 4. Mood: Team support  Methylphenidate 5 mg daily started on 3/20             -antipsychotic agents: NA 5. Neuropsych: This patient is not capable of making decisions on his own behalf.  Telesitter for safety  See #14  Appreciate neuropsych eval 6. Skin/Wound Care: Routine pressure relief measures.  7. Fluids/Electrolytes/Nutrition: Monitor I/O.  8. Hypothyroid: Continue Synthroid 162mcg. 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 Hypokalemia:   Potassium 3.2 on 3/23  Daily supplement initiated 11. Macrocytic anemia:   Hemoglobin 8.7 on 3/23  Vitamin B12 within normal limits  Folate WNL  Hemoccult negative  Continue to monitor 12.  Hypoalbuminemia  Supplement initiated on 3/4 13.  Severe Oral Dysphagia and mild to moderate pharyngeal dysphagia:   D1 honey thick liquids  Suplemental IV fluids DC'd on 3/23 14. Lethargy/decreased attention: Medications reviewed and he is not taking any overly sedating medications.   Sleep chart ordered, consider meds accordingly  Melatonin reordered on 3/22, increased on 3/23 15. Severe kyphosis: position with pillows when sleeping and sitting in chair, placed nursing order.  16.  Labile blood pressure   Vitals:   11/06/19 2041 11/06/19 2300  BP: (!) 165/91   Pulse: (!) 105   Resp: 18   Temp: 97.8 F (36.6 C)   SpO2: 100% 100%   Labile on 3/23 17.  Sleep disturbance  See #14 18. Impaired cognition secondary to dementia and encephalitis:   Repeat EEG with some improvement  MRI on 3/20 stable 20.  Hyperglycemia  Secondary to dextrose infusion   LOS: 20 days A FACE TO FACE EVALUATION WAS PERFORMED  Thersa Mohiuddin Lorie Phenix 11/07/2019,  8:48 AM

## 2019-11-07 NOTE — Progress Notes (Signed)
Physical Therapy Session Note  Patient Details  Name: Eric Larsen MRN: [id number] Date of Birth: 1931-04-13  Today's Date: 11/07/2019 PT Individual Time: 1000-1056 and 1300-1330  PT Individual Time Calculation (min): 56 min and 30 min  Short Term Goals: Week 2:  PT Short Term Goal 1 (Week 2): Will be able to perform bed mobility mod A PT Short Term Goal 1 - Progress (Week 2): Progressing toward goal PT Short Term Goal 2 (Week 2): Pt will perform bed<>chair transfer with LRAD mod A PT Short Term Goal 2 - Progress (Week 2): Progressing toward goal PT Short Term Goal 3 (Week 2): Pt will transfer sit<>stand with LRAD min A PT Short Term Goal 3 - Progress (Week 2): Met Week 3:  PT Short Term Goal 1 (Week 3): Will be able to perform bed mobility consistantly with mod A PT Short Term Goal 2 (Week 3): Pt will perform bed<>chair transfer with LRAD consistantly with mod A PT Short Term Goal 3 (Week 3): Pt will perform car transfer with LRAD mod A  Skilled Therapeutic Interventions/Progress Updates:   Treatment Session 1: 1000-1056 56 min Received pt sitting in WC, pt agreeable to therapy, and denied any pain during session. [name] and daughter present at bedside. Session focused on functional mobility/transfers, LE strength, dynamic standing balance/coordination, stair navigation, simulated car transfers, and improved activity tolerance. Pt transported to gym in [facility] total assist for time management purposes. Pt navigated 4 steps with 2 rails mod A with increased time ascending and descending with a step to pattern. Pt required mod verbal cues for hand placement on rail, stepping sequence, and technique. Pt ambulated 85f x 2 trials and performed simulated car transfer with RW mod A. Pt required verbal cues for safe entry, hand placement, and technique. Pt ambulated 120fon uneven surfaces (ramp) with RW min A. Therapist provided mod A when descending ramp for RW safety. Pt required verbal cues for  technique and stepping sequence. Pt performed LE strengthening on Kinetron at 30 cm/sec 3x1 minute without UE support. [facility]. Concluded session with pt sitting in WC, needs within reach, and seatbelt alarm on.   Treatment Session 2: 1300-1330 30 min Received pt sitting in WC, pt agreeable to therapy, and denied any pain during session. Session focused on functional mobility/transfers, LE strength, dynamic standing balance/coordination, and improved activity tolerance. Pt ambulated 1003fith RW min A to dayroom. Pt required verbal cues for increased stride length, upright posture, and RW safety. Pt transferred sit<>stand with RW min A with verbal cues for technique and hand placement on WC armrest and RW. Pt performed standing toe taps x10 bilaterally with UE support on RW CGA/min A. Pt able to correctly tap L LE to L cone and R LE to R cone 10/10 trials. Pt transferred sit<>stand with RW min A x 3 trials. Pt continues to require verbal cues for hand placement on WC armrest when pushing up. [facility] Concluded session with pt sitting in WC, needs within reach, and seatbelt alarm on.   Therapy Documentation Precautions:  Precautions Precautions: Fall Precaution Comments: dementia, slow processing/initiation/poor attention Restrictions Weight Bearing Restrictions: No  Therapy/Group: Individual Therapy AnnAlfonse Alpers, DPT   11/07/2019, 7:33 AM

## 2019-11-08 ENCOUNTER — Inpatient Hospital Stay (HOSPITAL_COMMUNITY): Payer: Medicare Other

## 2019-11-08 ENCOUNTER — Inpatient Hospital Stay (HOSPITAL_COMMUNITY): Payer: Medicare Other | Admitting: Speech Pathology

## 2019-11-08 ENCOUNTER — Inpatient Hospital Stay (HOSPITAL_COMMUNITY): Payer: Medicare Other | Admitting: Occupational Therapy

## 2019-11-08 DIAGNOSIS — E876 Hypokalemia: Secondary | ICD-10-CM

## 2019-11-08 LAB — TSH: TSH: 6.666 u[IU]/mL — ABNORMAL HIGH (ref 0.350–4.500)

## 2019-11-08 NOTE — Progress Notes (Signed)
Physical Therapy Session Note  Patient Details  Name: Eric Larsen MRN: [id number] Date of Birth: Apr 05, 1931  Today's Date: 11/08/2019 PT Individual Time: 9379-0240 and 1500-1523 PT Individual Time Calculation (min): 57 min and 23 min  Short Term Goals: Week 2:  PT Short Term Goal 1 (Week 2): Will be able to perform bed mobility mod A PT Short Term Goal 1 - Progress (Week 2): Progressing toward goal PT Short Term Goal 2 (Week 2): Pt will perform bed<>chair transfer with LRAD mod A PT Short Term Goal 2 - Progress (Week 2): Progressing toward goal PT Short Term Goal 3 (Week 2): Pt will transfer sit<>stand with LRAD min A PT Short Term Goal 3 - Progress (Week 2): Met Week 3:  PT Short Term Goal 1 (Week 3): Will be able to perform bed mobility consistantly with mod A PT Short Term Goal 2 (Week 3): Pt will perform bed<>chair transfer with LRAD consistantly with mod A PT Short Term Goal 3 (Week 3): Pt will perform car transfer with LRAD mod A  Skilled Therapeutic Interventions/Progress Updates:   Treatment Session 1: 9735-3299 57 min Received pt sitting in WC, pt agreeable to therapy, and denied any pain during session. Session focused on functional mobility/transfers, LE strength, dynamic standing balance/coordination, ambulation, and improved endurance with activity. Pt transferred sit<>stand with RW min A to begin ambulation. Noted pt with soiled pants. Pt ambulated 58f with RW min A to bed and transferred sit<>supine with min A. Doffed soiled brief and pants with max A and performed peri-care with total assist. Pt rolled to L and R with mod A and use of bedrails. Donned clean incontinence brief and pants with max A. Pt transferred supine<>sitting with CGA and increased time. Pt performed stand<>pivot transfer bed<>WC with RW min A. [facility] Worked on fine motor control zipping up jacket with mod A and verbal cues for technique while therapist took WOrestes measurements. Pt ambulated 715fwith RW min A back to room. Concluded session with pt sitting in WC, needs within reach, and seatbelt alarm on. [name] present at bedside.   Treatment Session 2: [id number] min Received pt sitting in WC with NT present taking vitals, pt agreeable to therapy, and denied any pain during session. [name] and 2 daughters present at bedside. Session focused on functional mobility/transfers, LE strength, dynamic standing balance/coordination, and improved endurance with activity. Pt transferred stand<>pivot WC<>bed with RW min A and sit<>supine with CGA. Pt with increased agitation upon returning to bed and therapist/family educated pt on importance of resting after spending all day [facility]/daughter requested to speak with therapist outside in hallway. Discussed discharge plan and pt's wife stating that she hasn't had enough time to find a facility to send the pt and that they were not prepared for the pt to discharge on Saturday. Pt's daughter voiced wanting to involve all of pt's children (including his son who has not been able to see pt in his current condition) and have a family conversation regarding discharge decisions; CSW aware of family's preferences. Concluded session with pt supine in bed, needs within reach, and bed alarm on. Family present at bedside.   Therapy Documentation Precautions:  Precautions Precautions: Fall Precaution Comments: dementia, slow processing/initiation/poor attention Restrictions Weight Bearing Restrictions: No   Therapy/Group: Individual Therapy AnAlfonse AlpersT, DPT   11/08/2019, 7:38 AM

## 2019-11-08 NOTE — Progress Notes (Signed)
Occupational Therapy Session Note  Patient Details  Name: Eric Larsen MRN: [id number] Date of Birth: 02-27-1931  Today's Date: 11/08/2019 OT Individual Time: [id number] OT Individual Time Calculation (min): 50 min    Short Term Goals: Week 3:  OT Short Term Goal 1 (Week 3): STG = LTGs due to remaining ELOS  Skilled Therapeutic Interventions/Progress Updates:    Treatment session with focus on sit > stand, standing balance, and functional mobility during self-care tasks.  Pt received upright in w/c with RN providing morning meds.  Pt agreeable to hygiene and LB dressing as pt incontinent of bladder.  Engaged in sit > stand from w/c at sink with Min assist.  Pt able to wash perineal area with increased time and hand over hand to initiate task as pt attempting to wash face each time.  Noted pt to also be incontinent of small BM therefore encouraged pt to attempt toileting.  Ambulated to toilet with RW with min assist and increased time, cues to sit on toilet as pt would squat but then attempt to return to standing.  Post toileting, donned incontinence brief total assist and pt ambulated back to room to w/c with RW min assist.  Pt donned pants with increased ability to reach towards feet to thread RLE, did require assist with LLE and when pulling pants over hips due to time constraints.  Pt remained upright in w/c with seat belt alarm on and family present to assist with feeding.  Education provided to pt's wife and 2 daughters throughout session: to allow for increased time for initiation, grading tasks depending on pt ability to initiate or attend based on varied level from day to day, and encouragement for pt to engage in familiar/functional tasks as able to increase success and decrease agitation.  Therapy Documentation Precautions:  Precautions Precautions: Fall Precaution Comments: dementia, slow processing/initiation/poor attention Restrictions Weight Bearing Restrictions: No Vital  Signs: Therapy Vitals Temp: 97.7 F (36.5 C) Pulse Rate: 92 Resp: 18 BP: 104/68 Patient Position (if appropriate): Sitting Oxygen Therapy SpO2: 100 % O2 Device: Room Air Pain:  Pt with no c/o pain   Therapy/Group: Individual Therapy  [name] 11/08/2019, 3:21 PM

## 2019-11-08 NOTE — Progress Notes (Signed)
Pt has slept approx 1 hour tonight. Alternative measures have been done to help promote good sleep environment, but had been unsuccessful. Pt is very anxious. Pt denies pain at this time. No obvious signs of distress noted.

## 2019-11-08 NOTE — Progress Notes (Signed)
Palliative Care Family Meeting  Met with patient's wife, daughter and another daughter and DIL by video conference. Mr. 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  He is extremely frail and has limited functional status. He has ongoing issues with severe agitation.   Family has been distressed by decision making regarding his care. His wife [name] has been by his side through his illness, but his daughters have largely been on the periphery since covid. He has one daughter visiting in person this week and after seeing him in person she is accepting how significant his decline and condition has become. He has a daughter in [place] who was on the phone and is taking a lead in obtaining medical facts and asking questions.  We discussed his current condition, QOL and potential trajectories of his illness and I stressed to them that none of them would return him to his baseline functional status and that he would in all cases need ongoing full support for care. His wife [name] says that he would not want to live in his current condition. They have decisded against a feeding tube although his daughter in [place] says that they can change their mind.  I anticipate that Mr. Trevarrow will have an aspiration event that may lead to his terminal decline or a fall in the setting of severe and persistent uncontrolled agitation. He is currently a full code and I addressed the urgency of needing to change this to DNR. All in agreement but his daughter in [place] is requesting a review his of his ACP/Living Will documents for guidance.  Mr. Theone Stanley IV fluids were stopped today per RN. He is eating food with full assistance but not able to drink fluids consistently. He may not be able to meet his fluid and nutrition needs moving forward and I attempted to prepare family for this possibility.  Additionally I asked his family to consider his current level of suffering and what the  patient would have told them based on the condition he has and his current state. 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  I recommended that his daughter and son who have not seen him come to [place]. I believe seeing him is critical to decision making for this family.   Recommendations: 1. Requested his children come to [place] so decisions about his care can be made ex. DNR, Goals, Possible comfort care transition- especially given his agitation.   2. Family to obtain his Living Will and Directives to review  3. His daughter wants his HSV Encephalitis to be reported as a Covid vaccine side effect. She plans on traveling here this week.  Lane Hacker, DO Palliative Medicine  Time: 70 min Greater than 50%  of this time was spent counseling and coordinating care related to the above assessment and plan.

## 2019-11-08 NOTE — Progress Notes (Signed)
[facility] PHYSICAL MEDICINE & REHABILITATION PROGRESS NOTE  Subjective/Complaints: Patient seen sitting up in his chair this AM.  He did not sleep well overnight.  Daughters present in room, wife later enters.  Prolonged discussion with family regarding multiple questions regarding, prognosis, nutrition, baseline dementia, sleep, antivirals, etc. Discussed with therapies as well.  He was seen by Palliative yesterday, notes reviewed.   ROS: Unable to accurately assess due to cognition.   Objective: Vital Signs: Blood pressure 127/81, pulse (!) 103, temperature 98.3 F (36.8 C), temperature source Oral, resp. rate 18, height 5\' 4"  (1.626 m), weight 50.8 kg, SpO2 96 %. No results found. Recent Labs    11/07/19 0419  WBC 6.3  HGB 8.7*  HCT 25.7*  PLT 242   Recent Labs    11/07/19 0419  NA 137  K 3.2*  CL 105  CO2 27  GLUCOSE 113*  BUN 8  CREATININE 0.74  CALCIUM 8.8*    Physical Exam: BP 127/81 (BP Location: Left Arm)   Pulse (!) 103   Temp 98.3 F (36.8 C) (Oral)   Resp 18   Ht 5\' 4"  (1.626 m)   Wt 50.8 kg   SpO2 96%   BMI 19.21 kg/m  Constitutional: No distress . Vital signs reviewed. HENT: Normocephalic.  Atraumatic. Eyes: EOMI. No discharge. Cardiovascular: No JVD. Respiratory: Normal effort.  No stridor. GI: Non-distended. Skin: Warm and dry.  Intact. Psych: Confused. Neurologic: Somnolent Motor: Grossly 4+/5 throughout, appears unchanged  Assessment/Plan: 1. 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  Physiatrist is providing close team supervision and 24 hour management of active medical problems listed below.  Physiatrist and rehab team continue to assess barriers to discharge/monitor patient progress toward functional and medical goals  Care Tool:  Bathing    Body parts bathed by patient: Right arm, Left arm, Chest, Abdomen, Face    Body parts bathed by helper: Front perineal area, Buttocks     Bathing assist Assist Level: Minimal Assistance - Patient > 75%     Upper Body Dressing/Undressing Upper body dressing   What is the patient wearing?: Pull over shirt    Upper body assist Assist Level: Minimal Assistance - Patient > 75%    Lower Body Dressing/Undressing Lower body dressing      What is the patient wearing?: Incontinence brief, Pants     Lower body assist Assist for lower body dressing: Moderate Assistance - Patient 50 - 74%     Toileting Toileting    Toileting assist Assist for toileting: Maximal Assistance - Patient 25 - 49%     Transfers Chair/bed transfer  Transfers assist     Chair/bed transfer assist level: Minimal Assistance - Patient > 75%     Locomotion Ambulation   Ambulation assist      Assist level: Minimal Assistance - Patient > 75% Assistive device: Walker-rolling Max distance: 171ft   Walk 10 feet activity   Assist     Assist level: Minimal Assistance - Patient > 75% Assistive device: Walker-rolling   Walk 50 feet activity   Assist Walk 50 feet with 2 turns activity did not occur: Safety/medical concerns(fatigued)  Assist level: Minimal Assistance - Patient > 75% Assistive device: Walker-rolling    Walk 150 feet activity   Assist Walk 150 feet activity did not occur: Safety/medical concerns(fatigued)         Walk 10 feet on uneven surface  activity   Assist Walk 10 feet on uneven surfaces activity did not occur: Safety/medical concerns(fatigued)   Assist level: Minimal Assistance - Patient > 75% Assistive device: Aeronautical engineer Will patient use wheelchair at discharge?: Yes Type of Wheelchair: Manual    Wheelchair assist level: Dependent - Patient 0%      Wheelchair 50 feet with 2 turns activity    Assist        Assist Level: Dependent - Patient 0%   Wheelchair 150 feet activity      Assist     Assist Level: Dependent - Patient 0%      Medical Problem List and Plan: 1.  Cognitive deficits with unintelligible language, weakness, secondary to herpes encephalitis.  Continue CIR             Completed course of IV acyclovir on 3/21, no changed per ID.   Appreciate Palliative care recs - recommending comfort care.   Team conference today to discuss current and goals and coordination of care, home and environmental barriers, and discharge planning with nursing, case manager, and therapies.  2.  Antithrombotics: -DVT/anticoagulation:  Pharmaceutical: Lovenox             -antiplatelet therapy: NA 3. Pain Management: PRN medications. Lidocaine patch ordered for left shoulder.    Appears controlled on 3/23 4. Mood: Team support  Methylphenidate 5 mg daily started on 3/20, d/ced on 3/23             -antipsychotic agents: NA 5. Neuropsych: This patient is not capable of making decisions on his own behalf.  Telesitter for safety  See #14  Appreciate neuropsych eval 6. Skin/Wound Care: Routine pressure relief measures.  7. Fluids/Electrolytes/Nutrition: Monitor I/O.  8. Hypothyroid: Continue Synthroid 160mcg.  TSH elevated on 3/24 9. Epileptogenic potential: Conitnue Keppra 500 mg bid for seizure  No seizures from admission-3/24 10. Hypokalemia:   Potassium 3.2 on 3/23, labs ordered for tomorrow  Daily supplement initiated 11. Macrocytic anemia:   Hemoglobin 8.7 on 3/23  Vitamin B12 within normal limits  Folate WNL  Hemoccult negative  Continue to monitor 12.  Hypoalbuminemia  Supplement initiated on 3/4 13.  Severe Oral Dysphagia and mild to moderate pharyngeal dysphagia:   D1 honey thick liquids  Suplemental IV fluids DC'd on 3/23 14. Lethargy/decreased attention: Medications reviewed and he is not taking any overly sedating medications.   Sleep chart ordered, consider meds accordingly  Melatonin reordered on 3/22, increased on 3/23  Will consider  additional medication tomorrow if necessary 15. Severe kyphosis: position with pillows when sleeping and sitting in chair, placed nursing order.  16.  Labile blood pressure   Vitals:   11/07/19 1934 11/08/19 0638  BP: 137/74 127/81  Pulse: 85 (!) 103  Resp: 16 18  Temp: 97.7 F (36.5 C) 98.3 F (36.8 C)  SpO2: 97% 96%   Controlled on 3/24 17.  Sleep disturbance  See #14 18. Impaired cognition secondary to dementia and encephalitis:   Repeat EEG with some improvement  MRI on 3/20 stable 20.  Hyperglycemia  Secondary to dextrose infusion  >45 minutes spent with patient and family, with >40 minutes in counseling with daughters and then wife regarding aforementioned.   LOS: 21 days A FACE TO FACE EVALUATION WAS PERFORMED  Franki Alcaide Lorie Phenix 11/08/2019, 1:15 PM

## 2019-11-08 NOTE — Progress Notes (Signed)
Speech Language Pathology Daily Session Note  Patient Details  Name: Zakariya Oherron MRN: [id number] Date of Birth: 1930-12-18  Today's Date: 11/08/2019 SLP Individual Time: 0800-0900 SLP Individual Time Calculation (min): 60 min  Short Term Goals: Week 3: SLP Short Term Goal 1 (Week 3): Pt will focus attention to basic task for ~ 1 minute with Max A multimodal cues for redirection. SLP Short Term Goal 2 (Week 3): Pt will folow 1 step very basic direction in 5 out of 10 opportunities with Mod A cues. SLP Short Term Goal 3 (Week 3): Pt will read information on calendar to state date and location with Max A cues.  Skilled Therapeutic Interventions:  Skilled treatment session targeted pt's cognition goals as well as education with pt's daughters [name] and Jonelle Sidle). Pt is overstimulated by basic tasks such as putting on shirt, attending to basic calendar (reading printed month as SLP points to it), transferring to wheelchair and after transfer he continues to be overstimulated d/t inability to cognitively understand tasks. Education provided to pt's daughters on how cognitive impairments impact all aspects of daily living. Education also provided on ways to interact with pt to decrease overstimulation. All questions answered at this time. At this time, pt has not made any functional progress towards ST goals and continues to be Total A.      Pain Pain Assessment Pain Scale: 0-10 Pain Score: 0-No pain  Therapy/Group: Individual Therapy  Klint Lezcano 11/08/2019, 12:11 PM

## 2019-11-08 NOTE — Patient Care Conference (Signed)
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 Hypokalemia   . Sleep disturbance   . Dementia associated with other underlying disease without behavioral disturbance ([place])   . Palliative care encounter   . Lethargy   . Acute blood loss anemia   . Hypoalbuminemia due to protein-calorie malnutrition ([place])   . Microcytic anemia   . Dysphagia   . Encephalitis 10/18/2019  . Abnormal EEG   . Hypothyroidism   . Macrocytic anemia   . 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 Hip fracture ([place]) 08/12/2017  . Hallux valgus of left foot 04/28/2017  . Hammer toe of left foot 04/28/2017  . Corn of toe 04/28/2017  . History of total left hip replacement 09/19/2015  . Spherocytosis (familial) ([place]) 09/17/2015  . Mixed hyperlipidemia 06/19/2015  . Benign prostatic hyperplasia with lower urinary tract symptoms 06/19/2015  . History of fracture of vertebra 11/06/2014  . History of colon polyps 08/08/2014  . Pathological fracture 11/01/2013  . Toenail fungus 01/20/2013  . Vitamin D deficiency 09/27/2012  . Primary hypothyroidism 09/27/2012  . Osteoporosis 09/27/2012  . Spherocytosis ([place]) 12/04/2011  . Skin cancer 12/04/2011    Expected Discharge Date: Expected Discharge Date: 11/11/19  Team Members Present: Physician leading conference: Dr. Delice Lesch Care Coodinator Present: Nestor Lewandowsky, RN, BSN, CRRN;Genie Jezebelle Ledwell, RN, MSN Nurse Present: Rayne Du, LPN PT Present: Becky Sax, PT OT Present: [name], OT SLP Present: Stormy Fabian, SLP PPS Coordinator present : Gunnar Fusi, Novella Olive, PT     Current Status/Progress Goal Weekly Team Focus  Bowel/Bladder   pt incontinent of B/B. Pt had multiple smear BM, LBM 11/07/19  obtain continence of bowel and bladder, and have regular pattern of BM  Q2h toileting/PRN   Swallow/Nutrition/ Hydration   Pt's family made decision for pt to consume Dysphagia 1 with honey thick liquids and known aspiration risks  No further dysphagia goals  MBS completed, very high risk of aspiration   ADL's   Min-mod assist sit > stand depending on fatigue/surface height, Min assist bathing and UB dressing, Mod Assist LB dressing.  Min assist ambulatory transfers  Min A  ADL retraining, activity tolerance, pt/family education   Mobility   bed mobility CGA, stand<>pivot with RW min A, 4 steps with 2 rails mod A, ambulating 16ft with RW min A  min A  functional mobility/bed mobility, transfers, motor control/planning, dynamic standing balance, endurance   Communication   Max A to Total A  d/t language of confusion  Max A to Total A  redirection to task, yes/no questions/context based   Safety/Cognition/ Behavioral Observations  Total A to Max A  Total A to Max A  sustained attention, basic probleming solving (sorting colors), reading a calendar   Pain   Pt denies pain at this time.  Pt will remain free of pain  Assess pain Qshift/ PRN   Skin   pt MASD to bottom/groin. No  other obvios signs of skin breakdown at this time.  maintain good skin integrity  Assess skin Qshift/PRN    Rehab Goals Patient on target to meet rehab goals: Yes Rehab Goals Revised: Goals set at Minimal assistance down graded due to slow progression, wife is aware of assistance need at discharge and new goals. *See Care Plan and progress notes for long and short-term goals.     Barriers to Discharge  Current Status/Progress Possible Resolutions Date Resolved   Nursing                   PT  Decreased caregiver support;Incontinence;Home environment access/layout                 OT                  SLP                SW Decreased caregiver support;Incontinence;Behavior;IV antibiotics wife is sole support for patient. Children out of state and little financial resources available; SNF may be a possibility for discharge although wife would like for patient to be able to go home. Wife discussing patient needs with family and looking at SNF vs private duty hired help at discharge          Discharge Planning/Teaching Needs:  Plan to go home with wife. ? SNF if needs more care than wife can provide; they would like to avoid SNF if at all possible  Toileting, transfers, medications, diet/restrictions, etc   Team Discussion: MD Herpes encephalitis, cognition deficits, dysphagia.  RN inc B/B.  OT min/mod sit to stand, min a UB B/D, mod LB D, min A transfers.  PT min A 100', at min A goal level.  SLP change to 3 X a week, fam ed done, max/total cognition and communication.   Revisions to Treatment Plan: N/A     Medical Summary Current Status: Cognitive deficits with unintelligible language, weakness, secondary to herpes encephalitis Weekly Focus/Goal: Improve therapies, cognition, sleep, dysphagia  Barriers to Discharge: Medical stability;Incontinence   Possible Resolutions to Barriers: Therapies, palliative care   Continued Need for Acute Rehabilitation Level of Care: The patient requires daily medical management by a physician with specialized training in physical medicine and rehabilitation for the following reasons: Direction of a multidisciplinary physical rehabilitation program to maximize functional independence : Yes Medical management of patient stability for increased activity during participation in an intensive rehabilitation regime.: Yes Analysis of laboratory values and/or radiology reports with any subsequent need for medication adjustment and/or medical  intervention. : Yes   I attest that I was present, lead the team conference, and concur with the assessment and plan of the team.   Retta Diones 11/08/2019, 9:12 PM

## 2019-11-09 ENCOUNTER — Inpatient Hospital Stay (HOSPITAL_COMMUNITY): Payer: Medicare Other | Admitting: Occupational Therapy

## 2019-11-09 ENCOUNTER — Inpatient Hospital Stay (HOSPITAL_COMMUNITY): Payer: Medicare Other

## 2019-11-09 ENCOUNTER — Inpatient Hospital Stay (HOSPITAL_COMMUNITY): Payer: Medicare Other | Admitting: Speech Pathology

## 2019-11-09 LAB — BASIC METABOLIC PANEL
Anion gap: 11 (ref 5–15)
BUN: 13 mg/dL (ref 8–23)
CO2: 25 mmol/L (ref 22–32)
Calcium: 9.8 mg/dL (ref 8.9–10.3)
Chloride: 103 mmol/L (ref 98–111)
Creatinine, Ser: 0.83 mg/dL (ref 0.61–1.24)
GFR calc Af Amer: >60 mL/min (ref >60)
GFR calc non Af Amer: >60 mL/min (ref >60)
Glucose, Bld: 110 mg/dL — ABNORMAL HIGH (ref 70–99)
Potassium: 4.2 mmol/L (ref 3.5–5.1)
Sodium: 139 mmol/L (ref 135–145)

## 2019-11-09 MED ORDER — POTASSIUM CHLORIDE CRYS ER 10 MEQ PO TBCR
10.0000 meq | EXTENDED_RELEASE_TABLET | Freq: Every day | ORAL | Status: DC
  Administered 2019-11-10 – 2019-11-15 (×6): 10 meq via ORAL
  Filled 2019-11-09 (×8): qty 1

## 2019-11-09 MED ORDER — ACETAMINOPHEN 500 MG PO TABS
1000.0000 mg | ORAL_TABLET | Freq: Every day | ORAL | Status: DC
  Administered 2019-11-09 – 2019-11-16 (×8): 1000 mg via ORAL
  Filled 2019-11-09 (×9): qty 2

## 2019-11-09 NOTE — Progress Notes (Signed)
Physical Therapy Session Note  Patient Details  Name: Eric Larsen MRN: [id number] Date of Birth: 10-04-1930  Today's Date: 11/09/2019 PT Individual Time: 1415-1526 PT Individual Time Calculation (min): 71 min   Short Term Goals: Week 2:  PT Short Term Goal 1 (Week 2): Will be able to perform bed mobility mod A PT Short Term Goal 1 - Progress (Week 2): Progressing toward goal PT Short Term Goal 2 (Week 2): Pt will perform bed<>chair transfer with LRAD mod A PT Short Term Goal 2 - Progress (Week 2): Progressing toward goal PT Short Term Goal 3 (Week 2): Pt will transfer sit<>stand with LRAD min A PT Short Term Goal 3 - Progress (Week 2): Met Week 3:  PT Short Term Goal 1 (Week 3): Will be able to perform bed mobility consistantly with mod A PT Short Term Goal 2 (Week 3): Pt will perform bed<>chair transfer with LRAD consistantly with mod A PT Short Term Goal 3 (Week 3): Pt will perform car transfer with LRAD mod A  Skilled Therapeutic Interventions/Progress Updates:   Received pt sitting in WC, pt agreeable to therapy, and denied any pain during session. Wife and daughters present at bedside. Session focused on functional mobility/transfers, LE strength, dynamic standing balance/coordination, ambulation, and improved endurance with activity. Pt with soiled brief. Pt ambulated 64f with RW CGA to bed. Pt transferred sit<>supine with CGA and verbal cues. Doffed soiled brief and pants max A and performed peri-care with total assist. Donned clean brief and pants with max A to roll to L and R with use of bedrails. Pt transferred supine<>sit with supervision. Pt transferred sit<>stand with RW min A and ambulated 558fwith RW min A to WC. Pt ambulated 15076fith RW min A to dayroom and transferred stand<>pivot WC<>Nustep with RW min A. Pt performed bilateral UE/LE strengthening on Nustep for 6 minutes and 30 seconds at workload 1 for total of 531 steps. Pt required mod verbal and tactile cues for  Nustep activity.Pt performed standing alternating toe taps to 6in step 2x20 with bilateral UE support on RW min A. Pt required verbal cues for stepping sequence and technique and was encouraged to count aloud with therapist, however pt unable to participate. [name]. Concluded session with pt sitting in WC, needs within reach, and seatbelt alarm on. Family present at bedside.   Therapy Documentation Precautions:  Precautions Precautions: Fall Precaution Comments: dementia, slow processing/initiation/poor attention Restrictions Weight Bearing Restrictions: No  Therapy/Group: Individual Therapy AnnAlfonse Alpers, DPT   11/09/2019, 7:36 AM

## 2019-11-09 NOTE — Progress Notes (Signed)
Occupational Therapy Session Note  Patient Details  Name: Eric Larsen MRN: [id number] Date of Birth: 1931/01/24  Today's Date: 11/09/2019 OT Individual Time: 1855-0158 OT Individual Time Calculation (min): 75 min    Short Term Goals: Week 1:  OT Short Term Goal 1 (Week 1): Pt will maintain static sitting balance with supervision for at least 3 mins in perparation for selfcare tasks. OT Short Term Goal 1 - Progress (Week 1): Progressing toward goal OT Short Term Goal 2 (Week 1): Pt will complete UB bathing with no more than min instructional cueing for initiation and sequencing. OT Short Term Goal 2 - Progress (Week 1): Progressing toward goal OT Short Term Goal 3 (Week 1): Pt will complete LB bathing sit to stand with max assist and use of AE PRN. OT Short Term Goal 3 - Progress (Week 1): Met OT Short Term Goal 4 (Week 1): Pt will complete toilet transfer with max assist using RW for support. OT Short Term Goal 4 - Progress (Week 1): Met OT Short Term Goal 5 (Week 1): Pt will donn a pullover shirt with no more than min assist in supported sitting for two consecutive trials. OT Short Term Goal 5 - Progress (Week 1): Progressing toward goal  Skilled Therapeutic Interventions/Progress Updates:    1:1 Pt had gotten up to the w/c with family and nursing and was reported very confused and was hard to redirect. Pt able to participated in eating breakfast with mod cues for management of bite size and alternating for liquids. Pt was able to self feed himself today. (thickened). Pt declined toileting / toilet transfers attempts. Pt engaged in washing face at the sink in standing with more than reasonable amt of time and extra time to up into standing with contact guard. Pt returned to sitting. Pt kept saying "lets go" so with pt's willingness; pt ambulated 3x ~15 feet with RW with contact guard with extra time for sit to stand to allow time to get up on his own ability. Returned to room and  participated in grooming tasks at the sink including shaving with electric razor and brushing teeth with suction with mod to max A. Pt's daughters present for entire session and wife came halfway through session. Discussed with them therapeutic approaches to managing confusion and his participation in self care tasks and functional mobility . Pt left resting in the w/c with family.   Therapy Documentation Precautions:  Precautions Precautions: Fall Precaution Comments: dementia, slow processing/initiation/poor attention Restrictions Weight Bearing Restrictions: No Pain:  no c/o pain   Therapy/Group: Individual Therapy  [name] 11/09/2019, 3:15 PM

## 2019-11-09 NOTE — Progress Notes (Signed)
Speech Language Pathology Daily Session Note  Patient Details  Name: Eric Larsen MRN: [id number] Date of Birth: 03/16/1931  Today's Date: 11/09/2019 SLP Individual Time: 0703--748 SLP Time Calculation: 38 min    Short Term Goals: Week 3: SLP Short Term Goal 1 (Week 3): Pt will focus attention to basic task for ~ 1 minute with Max A multimodal cues for redirection. SLP Short Term Goal 2 (Week 3): Pt will folow 1 step very basic direction in 5 out of 10 opportunities with Mod A cues. SLP Short Term Goal 3 (Week 3): Pt will read information on calendar to state date and location with Max A cues.  Skilled Therapeutic Interventions:  Skilled treatment session targeted pt's cognition goals. SLP recieved pt upright in wheelchair with his nurse in the hall. Pt didn't recognize me and was very resistance to coming with me d/t confusion. Therapy was provided in different places within dayroom d/t decreased attention and pt pushed away from activities after ~ 5 minutes. SLP facilitated session by providing Total assistance to have joint attention for ~ 2 minutes to high interest items. He continually commented "I am not doing this today." While displaying joint attention, pt completed very basic yes/no objects about the tasks with ~ 25% accuracy (Total assistance cues provided by SLP). We progressed to pt's room and Total assistance given to answer pt's many questions "what a minute, where are we going?" once this writer answered pt he stated "no not today, I don't have a room here." Once in room, basic familiar task of washing his face at this sink was provided. Pt admamently responded "I am not washing my face today." With extra time pt was able to put on aftershave. At this point pt's language of confusion as well as global confusion was worsening. Pt pushed away from sink and this SLP allowed him to self-direct activity. SLP stopped all verbal stimulation and pt enjoyed fidgeting with the strings on  his pants. For pt's safety, this writer could not leave pt unattended and sitting beside the sink. To increase pt's safety, SLP provided Total A (encouragement as well as emotional support) to move pt beside his bed but he continually grabbed the sink. NT in to aid with safe location within room, unfortunately despite Total assistance and emotional support, pt didn't understand and kept taking off his lap belt alarm and saying "this is not my room and I am leaving." For pt safety, he was transferred to bed and 1:1 provided as pt continued to get out of bed. SLP handed pt off to NT.      Pain    Therapy/Group: Individual Therapy  Kristopher Attwood 11/09/2019, 4:56 PM

## 2019-11-09 NOTE — Progress Notes (Signed)
Team Conference Report to Patient/Family  Team Conference discussion was reviewed with the patient and daughters, including goals, any changes in plan of care and target discharge date.  Patient and caregiver express understanding and are concerned about discharge. The daughter noted a son was coming to see the patient tonight and would discuss the situation and options for discharge. They requested a family meeting with Dr. Hilma Favors from Palliative care tomorrow. Reviewed information given to patient's wife for SNF and will assist with obtaining a SNF bed if necessary however would need preferences in order to proceed. Also if going home, gave information on Private duty facilities. Reported they were overwhelmed by the news of their father's current health status and needed some time to process. Support offered and noted to contact if they had questions or needed additional information. Dr. Hilma Favors notified of family request for a meeting. Dorien Chihuahua B 11/09/2019, 4:50 PM

## 2019-11-09 NOTE — Progress Notes (Signed)
Physical Therapy Weekly Progress Note  Patient Details  Name: Eric Larsen MRN: [id number] Date of Birth: 01-15-1931  Beginning of progress report period: October 19, 2019 End of progress report period: November 09, 2019  Patient has met 3 of 3 short term goals. Pt continues to fluctuate in his mobility status due to cognitive deficits. Noted pt with increased agitation especially at night. Therapist educated family on strategies to decrease agitation. Pt currently requires CGA/min A for bed mobility, min A to transfer with RW, and is able to ambulate up to 135f with RW min A. However, pt continues to demonstrate decreased problem solving, poor safety awareness, and is limited by cognitive deficits. Wife has been present during therapy sessions since initial eval and pt's 2 daughters a 1 son arrived this week to see pt's CLOF and discuss discharge planning.   Patient continues to demonstrate the following deficits muscle weakness, impaired timing and sequencing, unbalanced muscle activation, decreased coordination and decreased motor planning, decreased awareness, decreased problem solving and decreased safety awareness and decreased standing balance, decreased postural control and decreased balance strategies and therefore will continue to benefit from skilled PT intervention to increase functional independence with mobility.  Patient progressing toward long term goals..  Continue plan of care.  PT Short Term Goals Week 3:  PT Short Term Goal 1 (Week 3): Will be able to perform bed mobility consistantly with mod A PT Short Term Goal 1 - Progress (Week 3): Met PT Short Term Goal 2 (Week 3): Pt will perform bed<>chair transfer with LRAD consistantly with mod A PT Short Term Goal 2 - Progress (Week 3): Met PT Short Term Goal 3 (Week 3): Pt will perform car transfer with LRAD mod A PT Short Term Goal 3 - Progress (Week 3): Met Week 4:  PT Short Term Goal 1 (Week 4): STG=LTG due to LOS  Skilled  Therapeutic Interventions/Progress Updates:  Ambulation/gait training;DME/adaptive equipment instruction;Neuromuscular re-education;Stair training;UE/LE Strength taining/ROM;Discharge planning;Skin care/wound management;Therapeutic Activities;UE/LE Coordination activities;Cognitive remediation/compensation;Disease management/prevention;Functional mobility training;Patient/family education;Splinting/orthotics;Therapeutic Exercise;Visual/perceptual remediation/compensation;Balance/vestibular training   Therapy Documentation Precautions:  Precautions Precautions: Fall Precaution Comments: dementia, slow processing/initiation/poor attention Restrictions Weight Bearing Restrictions: No  AAlfonse AlpersPT, DPT  11/09/2019, 8:01 AM

## 2019-11-09 NOTE — Progress Notes (Signed)
[facility] PHYSICAL MEDICINE & REHABILITATION PROGRESS NOTE  Subjective/Complaints: Patient seen sitting up in his chair working with therapy this morning.  Sleep chart not updated, but discussed with nursing who states that patient slept well overnight.  Nursing notes patient slept well after being changed every 2 hours due to urinary incontinence.  Discussed condom cath with nursing.  ROS: Unable to accurately assess due to cognition  Objective: Vital Signs: Blood pressure 124/68, pulse 72, temperature 98.1 F (36.7 C), temperature source Oral, resp. rate 18, height 5\' 4"  (1.626 m), weight 50.8 kg, SpO2 92 %. No results found. Recent Labs    11/07/19 0419  WBC 6.3  HGB 8.7*  HCT 25.7*  PLT 242   Recent Labs    11/07/19 0419 11/09/19 0519  NA 137 139  K 3.2* 4.2  CL 105 103  CO2 27 25  GLUCOSE 113* 110*  BUN 8 13  CREATININE 0.74 0.83  CALCIUM 8.8* 9.8    Physical Exam: BP 124/68 (BP Location: Left Arm)   Pulse 72   Temp 98.1 F (36.7 C) (Oral)   Resp 18   Ht 5\' 4"  (1.626 m)   Wt 50.8 kg   SpO2 92%   BMI 19.21 kg/m  Constitutional: No distress . Vital signs reviewed. HENT: Normocephalic.  Atraumatic. Eyes: EOMI. No discharge. Cardiovascular: No JVD. Respiratory: Normal effort.  No stridor. GI: Non-distended. Skin: Warm and dry.  Intact. Psych: Confused. Musc: No edema in extremities.  No tenderness in extremities. Neurologic: Alert, disoriented. Motor: Grossly 4+/5 throughout, appears stable  Assessment/Plan: 1. 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  Physiatrist is providing close team supervision and 24 hour management of active medical problems listed below.  Physiatrist and rehab team continue to assess barriers to discharge/monitor patient progress toward functional and medical goals  Care Tool:  Bathing    Body parts bathed by  patient: Front perineal area   Body parts bathed by helper: Buttocks     Bathing assist Assist Level: Moderate Assistance - Patient 50 - 74%     Upper Body Dressing/Undressing Upper body dressing   What is the patient wearing?: Pull over shirt    Upper body assist Assist Level: Minimal Assistance - Patient > 75%    Lower Body Dressing/Undressing Lower body dressing      What is the patient wearing?: Incontinence brief, Pants     Lower body assist Assist for lower body dressing: Moderate Assistance - Patient 50 - 74%     Toileting Toileting    Toileting assist Assist for toileting: Maximal Assistance - Patient 25 - 49%     Transfers Chair/bed transfer  Transfers assist     Chair/bed transfer assist level: Minimal Assistance - Patient > 75%     Locomotion Ambulation   Ambulation assist      Assist level: Minimal Assistance - Patient > 75% Assistive device: Walker-rolling Max distance: 44ft   Walk 10 feet activity   Assist     Assist level: Minimal Assistance - Patient > 75% Assistive device: Walker-rolling   Walk 50 feet activity   Assist Walk 50 feet with 2 turns activity did not occur: Safety/medical concerns(fatigued)  Assist level: Minimal Assistance - Patient > 75% Assistive device: Walker-rolling    Walk 150 feet activity   Assist Walk 150 feet activity did not occur: Safety/medical concerns(fatigued)  Walk 10 feet on uneven surface  activity   Assist Walk 10 feet on uneven surfaces activity did not occur: Safety/medical concerns(fatigued)   Assist level: Minimal Assistance - Patient > 75% Assistive device: Aeronautical engineer Will patient use wheelchair at discharge?: Yes Type of Wheelchair: Manual    Wheelchair assist level: Dependent - Patient 0%      Wheelchair 50 feet with 2 turns activity    Assist        Assist Level: Dependent - Patient 0%   Wheelchair 150 feet  activity     Assist     Assist Level: Dependent - Patient 0%      Medical Problem List and Plan: 1.  Cognitive deficits with unintelligible language, weakness, secondary to herpes encephalitis with history of dementia.  Continue CIR             Completed course of IV acyclovir on 3/21, no changed per ID.   Appreciate Palliative care recs - recommending comfort care.  2.  Antithrombotics: -DVT/anticoagulation:  Pharmaceutical: Lovenox             -antiplatelet therapy: NA 3. Pain Management: PRN medications. Lidocaine patch ordered for left shoulder.    Appears controlled on 3/25 4. Mood: Team support  Methylphenidate 5 mg daily started on 3/20, d/ced on 3/23             -antipsychotic agents: NA 5. Neuropsych: This patient is not capable of making decisions on his own behalf.  Underlying dementia  Telesitter for safety  See #14  Appreciate neuropsych eval 6. Skin/Wound Care: Routine pressure relief measures.  7. Fluids/Electrolytes/Nutrition: Monitor I/O.  8. Hypothyroid: Continue Synthroid 154mcg.  TSH elevated on 3/24, likely stress related 9. Epileptogenic potential: Conitnue Keppra 500 mg bid for seizure  No seizures from admission-3/25 10. Hypokalemia:   Potassium 4.2 on 3/25  Daily supplement initiated, decreased on 3/26 11. Macrocytic anemia:   Hemoglobin 8.7 on 3/23  Vitamin B12 within normal limits  Folate WNL  Hemoccult negative  Continue to monitor 12.  Hypoalbuminemia  Supplement initiated on 3/4 13.  Severe Oral Dysphagia and mild to moderate pharyngeal dysphagia:   D1 honey thick liquids  Suplemental IV fluids DC'd on 3/23 14. Lethargy/decreased attention: Medications reviewed and he is not taking any overly sedating medications.   Sleep chart ordered, consider meds accordingly  Melatonin reordered on 3/22, increased on 3/23  ?  Improving 15. Severe kyphosis: position with pillows when sleeping and sitting in chair, placed nursing order.  16.   Labile blood pressure   Vitals:   11/08/19 2031 11/09/19 0359  BP: 109/66 124/68  Pulse: 99 72  Resp: 19 18  Temp: 97.7 F (36.5 C) 98.1 F (36.7 C)  SpO2: 98% 92%   Controlled on 3/25 17.  Sleep disturbance  See #14 18. Impaired cognition secondary to dementia and encephalitis:   Repeat EEG with some improvement  MRI on 3/20 stable 20.  Hyperglycemia  Secondary to dextrose infusion  Continue to monitor  LOS: 22 days A FACE TO FACE EVALUATION WAS PERFORMED  Baldo Hufnagle Lorie Phenix 11/09/2019, 9:59 AM

## 2019-11-09 NOTE — Progress Notes (Signed)
Palliative Care Family Meeting  Met with Patient's wife, two daughter and his son to discuss patient's goals of care. 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 Family has been under stress related to his prognosis and disposition planning. His wife has largely been navigating this alone, she had hoped he would recover enough for him to come home but he has had persistent agitation and delirium and she feels he is too much for her to handle at her home alone.  In terms of his prognosis they all accept he has likely reached his full potential and have accepted that his life expectancy may be short given his high risk for aspiration of a fall. They want comfort and QOL to be his main goal of care. We discussed the need for symptom management if his agitation could not be redirected or persists.  The major needs of this family are care coordination and disposition planning. They do not know what their options are for post-CIR care- and there is confusion about what they should be doing to help with this process. I have discussed from my knowledge base what some of the possibilities may be- it does not appear that they have a list of facilities with bed offers and daughters have committed to helping wife overcome any financial barriers.  Summary of Goals:  1. DNR, I reviewed his living will and HCPOA- his wife is HCPOA. We also discussed a MOST Form which will be needed prior to his discharge.  2. All agree that home with 24/7 caregivers is not possible and that his care requires more than can be done in the home.  3. I have recommended patient go to SNF/LTC with hospice care. They are agreeable to this - they need to know what facilities have beds/bed offers. The need social work and care management guidance and support in this process.  Will follow.  Lane Hacker, DO Palliative  Medicine [phone]

## 2019-11-10 ENCOUNTER — Inpatient Hospital Stay (HOSPITAL_COMMUNITY): Payer: Medicare Other | Admitting: Occupational Therapy

## 2019-11-10 ENCOUNTER — Inpatient Hospital Stay (HOSPITAL_COMMUNITY): Payer: Medicare Other

## 2019-11-10 NOTE — Progress Notes (Signed)
Speech Language Pathology Weekly Progress and Session Note  Patient Details  Name: Eric Larsen MRN: [id number] Date of Birth: 1931-06-20  Beginning of progress report period: November 03, 2019 End of progress report period: November 10, 2019    Short Term Goals: Week 3: SLP Short Term Goal 1 (Week 3): Pt will focus attention to basic task for ~ 1 minute with Max A multimodal cues for redirection. SLP Short Term Goal 1 - Progress (Week 3): Not met SLP Short Term Goal 2 (Week 3): Pt will folow 1 step very basic direction in 5 out of 10 opportunities with Mod A cues. SLP Short Term Goal 2 - Progress (Week 3): Not met SLP Short Term Goal 3 (Week 3): Pt will read information on calendar to state date and location with Max A cues. SLP Short Term Goal 3 - Progress (Week 3): Not met    New Short Term Goals: Week 4: SLP Short Term Goal 1 (Week 4): STGs = LTGs d/t remaining ELOS  Weekly Progress Updates: Pt has not made any functional progress this reporting period.Given lack of functional progress pt's ST services are reduced to 3 x week. He continues to require Total A for all tasks. Much of this reporting period has focused on extensive caregiver education with pt's daughter Jonelle Sidle and [name]). All questions have been answered at this time. Current plan if for pt to discharge to SNF under Hospice care.      Intensity: Minumum of 1-2 x/day, 30 to 90 minutes Frequency: 1 to 3 out of 7 days Duration/Length of Stay: pending SNf placement Treatment/Interventions: Cognitive remediation/compensation;Functional tasks;Patient/family education   Stormy Fabian 11/10/2019, 4:17 PM

## 2019-11-10 NOTE — Progress Notes (Signed)
Slept great last night. Scheduled tylenol and melatonin given at 2017. No agitation or combativeness. Incontinent of urine. telesitter in place for safety.Eric Larsen A

## 2019-11-10 NOTE — NC FL2 (Signed)
[place] LEVEL OF CARE SCREENING TOOL     IDENTIFICATION  Patient Name: Eric Larsen Birthdate: 18-Oct-1930 Sex: male Admission Date (Current Location): 10/18/2019  [facility] [place] [place] Number:  Herbalist and Address:  [facility] [place] [place] [address] [place], [place] [address]      Provider Number:    Attending Physician Name and Address:  Jamse Arn, MD  Relative Name and Phone Number:  Lashun Craver [phone]    Current Level of Care: SNF Recommended Level of Care: [place] Prior Approval Number:    Date Approved/Denied:   PASRR Number: [id number]  Discharge Plan: SNF    Current Diagnoses: Patient Active Problem List   Diagnosis Date Noted  . Hypokalemia   . Sleep disturbance   . Dementia associated with other underlying disease without behavioral disturbance ([place])   . Palliative care encounter   . Lethargy   . Acute blood loss anemia   . Hypoalbuminemia due to protein-calorie malnutrition ([place])   . Microcytic anemia   . Dysphagia   . Encephalitis 10/18/2019  . Abnormal EEG   . Hypothyroidism   . Macrocytic anemia   . 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 Hip fracture ([place]) 08/12/2017  . Hallux valgus of left foot 04/28/2017  . Hammer toe of left foot 04/28/2017  . Corn of toe 04/28/2017  . History of total left hip replacement 09/19/2015  . Spherocytosis (familial) ([place]) 09/17/2015  . Mixed hyperlipidemia 06/19/2015  . Benign prostatic hyperplasia with lower urinary tract symptoms 06/19/2015  . History of fracture of vertebra 11/06/2014  . History of colon polyps 08/08/2014  . Pathological fracture 11/01/2013  . Toenail fungus 01/20/2013  . Vitamin D deficiency 09/27/2012  . Primary hypothyroidism 09/27/2012  . Osteoporosis 09/27/2012  . Spherocytosis ([place]) 12/04/2011  . Skin cancer 12/04/2011    Orientation RESPIRATION BLADDER Height &  Weight     Self, Place  Normal Incontinent Weight: 50.8 kg Height:  5\' 4"  (162.6 cm)  BEHAVIORAL SYMPTOMS/MOOD NEUROLOGICAL BOWEL NUTRITION STATUS      Incontinent Diet  AMBULATORY STATUS COMMUNICATION OF NEEDS Skin   Limited Assist Verbally Normal                       Personal Care Assistance Level of Assistance  Bathing, Dressing, Feeding Bathing Assistance: Limited assistance Feeding assistance: Limited assistance Dressing Assistance: Limited assistance     Functional Limitations Info  Sight, Hearing Sight Info: Impaired Hearing Info: Impaired      SPECIAL CARE FACTORS FREQUENCY                       Contractures Contractures Info: Not present    Additional Factors Info                  Current Medications (11/10/2019):  This is the current hospital active medication list Current Facility-Administered Medications  Medication Dose Route Frequency Provider Last Rate Last Admin  . acetaminophen (TYLENOL) tablet 1,000 mg  1,000 mg Oral QHS Lane Hacker L, DO   1,000 mg at 11/09/19 2017  . bisacodyl (DULCOLAX) suppository 10 mg  10 mg Rectal Daily PRN Bary Leriche, PA-C   10 mg at 10/25/19 0439  . enoxaparin (LOVENOX) injection 40 mg  40 mg Subcutaneous Q24H Bary Leriche, PA-C   40 mg at 11/09/19 2018  . fluticasone (FLONASE) 50 MCG/ACT  nasal spray 1 spray  1 spray Each Nare Daily Bary Leriche, PA-C   1 spray at 11/08/19 [id number]  . folic acid (FOLVITE) tablet 1 mg  1 mg Oral Daily Donnamae Jude, RPH   1 mg at 11/10/19 [id number]  . levETIRAcetam (KEPPRA) tablet 500 mg  500 mg Oral BID Donnamae Jude, RPH   500 mg at 11/10/19 0736  . levothyroxine (SYNTHROID) tablet 125 mcg  125 mcg Oral Q0600 Donnamae Jude, [facility]   125 mcg at 11/10/19 [id number]  . lidocaine (LIDODERM) 5 % 1 patch  1 patch Transdermal Q24H Raulkar, Clide Deutscher, MD   1 patch at 11/09/19 1602  . melatonin tablet 3 mg  3 mg Oral QHS Jamse Arn, MD   3 mg at 11/09/19 2017  . potassium chloride  (KLOR-CON) CR tablet 10 mEq  10 mEq Oral Daily Jamse Arn, MD   10 mEq at 11/10/19 0736  . prochlorperazine (COMPAZINE) injection 5-10 mg  5-10 mg Intramuscular Q6H PRN Love, Pamela S, PA-C       Or  . prochlorperazine (COMPAZINE) suppository 12.5 mg  12.5 mg Rectal Q6H PRN Bary Leriche, PA-C         Discharge Medications: Please see discharge summary for a list of discharge medications.  Relevant Imaging Results:  Relevant Lab Results:   Additional Information    Margarito Liner, RN

## 2019-11-10 NOTE — Progress Notes (Signed)
Eric Larsen PHYSICAL MEDICINE & REHABILITATION PROGRESS NOTE  Subjective/Complaints: Patient seen laying in bed this morning.  Slept well overnight per sleep chart.  He was seen by palliative yesterday, notes reviewed-?  Plan for SNF with hospice.  ROS: Unable to accurately assess due to cognition  Objective: Vital Signs: Blood pressure 107/64, pulse 100, temperature 98.1 F (36.7 C), temperature source Oral, resp. rate 17, height 5\' 4"  (1.626 m), weight 50.8 kg, SpO2 94 %. No results found. No results for input(s): WBC, HGB, HCT, PLT in the last 72 hours. Recent Labs    11/09/19 0519  NA 139  K 4.2  CL 103  CO2 25  GLUCOSE 110*  BUN 13  CREATININE 0.83  CALCIUM 9.8    Physical Exam: BP 107/64 (BP Location: Left Arm)   Pulse 100   Temp 98.1 F (36.7 C) (Oral)   Resp 17   Ht 5\' 4"  (1.626 m)   Wt 50.8 kg   SpO2 94%   BMI 19.21 kg/m  Constitutional: No distress . Vital signs reviewed. HENT: Normocephalic.  Atraumatic. Eyes: EOMI. No discharge. Cardiovascular: No JVD. Respiratory: Normal effort.  No stridor. GI: Non-distended. Skin: Warm and dry.  Intact. Psych: Confused. Musc: No edema in extremities.  No tenderness in extremities. Neurologic: Alert Motor: Grossly 4+/5 throughout, appears stable  Assessment/Plan: 1. 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  Physiatrist is providing close team supervision and 24 hour management of active medical problems listed below.  Physiatrist and rehab team continue to assess barriers to discharge/monitor patient progress toward functional and medical goals  Care Tool:  Bathing    Body parts bathed by patient: Front perineal area   Body parts bathed by helper: Buttocks     Bathing assist Assist Level: Moderate Assistance - Patient 50 - 74%     Upper Body Dressing/Undressing Upper body dressing   What  is the patient wearing?: Pull over shirt    Upper body assist Assist Level: Minimal Assistance - Patient > 75%    Lower Body Dressing/Undressing Lower body dressing      What is the patient wearing?: Incontinence brief, Pants     Lower body assist Assist for lower body dressing: Maximal Assistance - Patient 25 - 49%     Toileting Toileting    Toileting assist Assist for toileting: Maximal Assistance - Patient 25 - 49%     Transfers Chair/bed transfer  Transfers assist     Chair/bed transfer assist level: Minimal Assistance - Patient > 75%     Locomotion Ambulation   Ambulation assist      Assist level: Minimal Assistance - Patient > 75% Assistive device: Walker-rolling Max distance: 198ft   Walk 10 feet activity   Assist     Assist level: Minimal Assistance - Patient > 75% Assistive device: Walker-rolling   Walk 50 feet activity   Assist Walk 50 feet with 2 turns activity did not occur: Safety/medical concerns(fatigued)  Assist level: Minimal Assistance - Patient > 75% Assistive device: Walker-rolling    Walk 150 feet activity   Assist Walk 150 feet activity did not occur: Safety/medical concerns(fatigued)  Assist level: Minimal Assistance - Patient > 75% Assistive device: Walker-rolling    Walk 10 feet on uneven surface  activity   Assist Walk 10 feet on uneven surfaces activity did not occur: Safety/medical concerns(fatigued)   Assist level: Minimal Assistance - Patient > 75%  Assistive device: Aeronautical engineer Will patient use wheelchair at discharge?: Yes Type of Wheelchair: Manual    Wheelchair assist level: Dependent - Patient 0%      Wheelchair 50 feet with 2 turns activity    Assist        Assist Level: Dependent - Patient 0%   Wheelchair 150 feet activity     Assist     Assist Level: Dependent - Patient 0%      Medical Problem List and Plan: 1.  Cognitive deficits with  unintelligible language, weakness, secondary to herpes encephalitis with history of dementia.  Continue CIR             Completed course of IV acyclovir on 3/21, no changed per ID.   Appreciate Palliative care recs - plan for SNF with hospice.  2.  Antithrombotics: -DVT/anticoagulation:  Pharmaceutical: Lovenox             -antiplatelet therapy: NA 3. Pain Management: PRN medications. Lidocaine patch ordered for left shoulder.    Appears controlled on 3/26 4. Mood: Team support  Methylphenidate 5 mg daily started on 3/20, d/ced on 3/23             -antipsychotic agents: NA 5. Neuropsych: This patient is not capable of making decisions on his own behalf.  Underlying dementia  Telesitter for safety  See #14  Appreciate neuropsych eval 6. Skin/Wound Care: Routine pressure relief measures.  7. Fluids/Electrolytes/Nutrition: Monitor I/O.  8. Hypothyroid: Continue Synthroid 118mcg.  TSH elevated on 3/24, likely stress related 9. Epileptogenic potential: Conitnue Keppra 500 mg bid for seizure  No seizures from admission-3/26 10. Hypokalemia:   Potassium 4.2 on 3/25  Daily supplement initiated, decreased on 3/26 11. Macrocytic anemia:   Hemoglobin 8.7 on 3/23  Vitamin B12 within normal limits  Folate WNL  Hemoccult negative  Continue to monitor 12.  Hypoalbuminemia  Supplement initiated on 3/4 13.  Severe Oral Dysphagia and mild to moderate pharyngeal dysphagia:   D1 honey thick liquids  Suplemental IV fluids DC'd on 3/23  BMP within acceptable range on 3/25 14. Lethargy/decreased attention: Medications reviewed and he is not taking any overly sedating medications.   Sleep chart ordered, consider meds accordingly  Melatonin reordered on 3/22, increased on 3/23  Improving 15. Severe kyphosis: position with pillows when sleeping and sitting in chair, placed nursing order.  16.  Labile blood pressure   Vitals:   11/09/19 1946 11/10/19 0427  BP: 117/75 107/64  Pulse: (!) 101 100   Resp:    Temp: 98.7 F (37.1 C) 98.1 F (36.7 C)  SpO2: 96% 94%   Heart rate labile on 3/26 17.  Sleep disturbance  See #14 18. Impaired cognition secondary to dementia and encephalitis:   Repeat EEG with some improvement  MRI on 3/20 stable 20.  Hyperglycemia  Secondary to dextrose infusion, DC'd  Continue to monitor  LOS: 23 days A FACE TO FACE EVALUATION WAS PERFORMED  Ankit Lorie Phenix 11/10/2019, 9:57 AM

## 2019-11-10 NOTE — Progress Notes (Signed)
Occupational Therapy Session Note  Patient Details  Name: Eric Larsen MRN: [id number] Date of Birth: 1931/02/23  Today's Date: 11/10/2019 OT Individual Time: 1100-1200 and 1400-1458 OT Individual Time Calculation (min): 60 min and 58 min   Short Term Goals: Week 3:  OT Short Term Goal 1 (Week 3): STG = LTGs due to remaining ELOS  Skilled Therapeutic Interventions/Progress Updates:   1) Treatment session with focus on self-care retraining with sequencing and initiation during UB bathing and dressing tasks.  Pt received upright in w/c agreeable to therapy session.  Engaged in bathing at sink with increased time and cues to doff jacket and shirt, pt requiring up to mod assist to doff jacket.  Hand over hand for initiation during UB bathing and when washing perineal area.  Pt required more than reasonable amount of time and mod cues for orientation of shirt and sequencing donning shirt.  Min assist sit > stand and cues for hand placement to increase safety with sit > stand.  Therapist doffed soiled brief and donned clean one while pt maintained standing at sink.  Pt able to pull pants back up over hips in standing with increased time.  Total assist for donning socks.  Pt left seated at sink with seat belt alarm on while he combed his hair.  [name] and 2 daughters present throughout session.  2) Treatment session with focus on functional mobility, orientation, and activity tolerance.  Pt received upright in w/c agreeable to therapy session.  Encouraged mobility outside this session with increased focus on orientation and quality of life.  Once outside, pt ambulated 43' with RW with CGA and cues for sequencing and safety over uneven surfaces.  Pt pleased to be able to engage in therapy session outside.  Engaged in conversation regarding pt and family goals as well as tasks they can do with him to encourage orientation to time/situation and ease frustration.  Encouraged use of current events (ie  sports) as well as use of family pictures to recall previous events.  Pt remained upright in w/c with seat belt alarm on and all needs in reach.  Therapy Documentation Precautions:  Precautions Precautions: Fall Precaution Comments: dementia, slow processing/initiation/poor attention Restrictions Weight Bearing Restrictions: No Pain:  Pt with no c/o pain in AM or PM session.   Therapy/Group: Individual Therapy  [name] 11/10/2019, 3:04 PM

## 2019-11-10 NOTE — Progress Notes (Signed)
Physical Therapy Session Note  Patient Details  Name: Ameir Faria MRN: [id number] Date of Birth: 1931-03-13  Today's Date: 11/10/2019 PT Individual Time: 5625-6389 and 3734-2876  PT Individual Time Calculation (min): 44 min and 28 min  Short Term Goals: Week 3:  PT Short Term Goal 1 (Week 3): Will be able to perform bed mobility consistantly with mod A PT Short Term Goal 1 - Progress (Week 3): Met PT Short Term Goal 2 (Week 3): Pt will perform bed<>chair transfer with LRAD consistantly with mod A PT Short Term Goal 2 - Progress (Week 3): Met PT Short Term Goal 3 (Week 3): Pt will perform car transfer with LRAD mod A PT Short Term Goal 3 - Progress (Week 3): Met Week 4:  PT Short Term Goal 1 (Week 4): STG=LTG due to LOS  Skilled Therapeutic Interventions/Progress Updates:   Treatment Session 1: 8115-7262 44 min Received pt supine in bed, pt agreeable to therapy, and denied any pain during session. Session focused on functional mobility/transfers, dressing, LE strength, dynamic standing balance/coordination, and improved endurance with activity. Pt with soiled brief. Doffed soiled incontinence brief and pants with max A, performed peri-care with total assist, and rolled to L and R with mod A and use of bedrails to don clean incontinence brief and pants with max A. Pt transferred supine<>sitting EOB with supervision and increased time. Pt transferred bed<>WC stand<>pivot with RW min A. Pt required verbal cues for turning sequence, RW safety, and to reach back for the WC. Pt transported to gym in [facility] total assist. Pt performed simulated car transfer with RW min A with increased time and max verbal cues for technique and sequencing. Pt transported back to room in [facility] total assist. Concluded session with pt sitting in WC, needs within reach, and seatbelt alarm on. Family present at bedside. Therapist assisted with re-heating breakfast for pt.   Treatment Session 2: 0355-9741 28 min Received pt  sitting in WC, pt agreeable to therapy, and denied any pain during session. Session focused on functional mobility/transfers, LE strength, dynamic standing balance/coordination, ambulation, and improved endurance with activity. Pt transported to dayroom in [facility] total assist. Pt transferred sit<>stand with RW min A and ambulated 37f with RW min A with increased time. Pt required verbal cues for upright posture, RW safety, and stepping sequence. Pt transferred sit<>stand with RW min A and performed standing alternating marches with bilateral UE support on RW CGA 2x10. Pt required verbal cues for technique and sequencing. [facility] Concluded session with pt sitting in WC, needs within reach, and seatbelt alarm on. Family present at bedside.   Therapy Documentation Precautions:  Precautions Precautions: Fall Precaution Comments: dementia, slow processing/initiation/poor attention Restrictions Weight Bearing Restrictions: No  Therapy/Group: Individual Therapy AAlfonse AlpersPT, DPT   11/10/2019, 7:24 AM

## 2019-11-11 NOTE — Progress Notes (Signed)
Family is wondering if therapy can order a 16x16 x18 wheelchair pad. Family also is asking if PT will come speak to them about safety concerns with him sitting in the recliner. [place]

## 2019-11-11 NOTE — Progress Notes (Signed)
[place] PHYSICAL MEDICINE & REHABILITATION PROGRESS NOTE  Subjective/Complaints: Patient seen laying in bed this morning.  He slept fairly well overnight per sleep chart.  He is somnolent this morning.  ROS: Unable to accurately assess due to cognition  Objective: Vital Signs: Blood pressure (!) 103/59, pulse 80, temperature 98.7 F (37.1 C), temperature source Oral, resp. rate 16, height 5\' 4"  (1.626 m), weight 50.8 kg, SpO2 93 %. No results found. No results for input(s): WBC, HGB, HCT, PLT in the last 72 hours. Recent Labs    11/09/19 0519  NA 139  K 4.2  CL 103  CO2 25  GLUCOSE 110*  BUN 13  CREATININE 0.83  CALCIUM 9.8    Physical Exam: BP (!) 103/59 (BP Location: Right Arm)   Pulse 80   Temp 98.7 F (37.1 C) (Oral)   Resp 16   Ht 5\' 4"  (1.626 m)   Wt 50.8 kg   SpO2 93%   BMI 19.21 kg/m  Constitutional: No distress . Vital signs reviewed. HENT: Normocephalic.  Atraumatic. Eyes: EOMI. No discharge. Cardiovascular: No JVD. Respiratory: Normal effort.  No stridor. GI: Non-distended. Skin: Warm and dry.  Intact. Psych: Confused. Musc: No edema in extremities.  No tenderness in extremities. Neurologic: Somnolent Motor: Grossly 4+/5 throughout, appears unchanged  Assessment/Plan: 1. 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  Physiatrist is providing close team supervision and 24 hour management of active medical problems listed below.  Physiatrist and rehab team continue to assess barriers to discharge/monitor patient progress toward functional and medical goals  Care Tool:  Bathing    Body parts bathed by patient: Right arm, Left arm, Chest, Abdomen, Front perineal area   Body parts bathed by helper: Buttocks     Bathing assist Assist Level: Minimal Assistance - Patient > 75%     Upper Body Dressing/Undressing Upper body dressing    What is the patient wearing?: Pull over shirt    Upper body assist Assist Level: Minimal Assistance - Patient > 75%    Lower Body Dressing/Undressing Lower body dressing      What is the patient wearing?: Incontinence brief, Pants     Lower body assist Assist for lower body dressing: Maximal Assistance - Patient 25 - 49%     Toileting Toileting    Toileting assist Assist for toileting: Maximal Assistance - Patient 25 - 49%     Transfers Chair/bed transfer  Transfers assist     Chair/bed transfer assist level: Minimal Assistance - Patient > 75%     Locomotion Ambulation   Ambulation assist      Assist level: Minimal Assistance - Patient > 75% Assistive device: Walker-rolling Max distance: 72ft   Walk 10 feet activity   Assist     Assist level: Minimal Assistance - Patient > 75% Assistive device: Walker-rolling   Walk 50 feet activity   Assist Walk 50 feet with 2 turns activity did not occur: Safety/medical concerns(fatigued)  Assist level: Minimal Assistance - Patient > 75% Assistive device: Walker-rolling    Walk 150 feet activity   Assist Walk 150 feet activity did not occur: Safety/medical concerns(fatigued)  Assist level: Minimal Assistance - Patient > 75% Assistive device: Walker-rolling    Walk 10 feet on uneven surface  activity   Assist Walk 10 feet on uneven surfaces activity did not occur: Safety/medical concerns(fatigued)   Assist level: Minimal Assistance - Patient > 75%  Assistive device: Aeronautical engineer Will patient use wheelchair at discharge?: Yes Type of Wheelchair: Manual    Wheelchair assist level: Dependent - Patient 0%      Wheelchair 50 feet with 2 turns activity    Assist        Assist Level: Dependent - Patient 0%   Wheelchair 150 feet activity     Assist     Assist Level: Dependent - Patient 0%      Medical Problem List and Plan: 1.  Cognitive deficits  with unintelligible language, weakness, secondary to herpes encephalitis with history of dementia.  Continue CIR             Completed course of IV acyclovir on 3/21, no changed per ID.   Appreciate Palliative care recs - plan for SNF with hospice.  2.  Antithrombotics: -DVT/anticoagulation:  Pharmaceutical: Lovenox             -antiplatelet therapy: NA 3. Pain Management: PRN medications. Lidocaine patch ordered for left shoulder.    Appears controlled on 3/27 4. Mood: Team support  Methylphenidate 5 mg daily started on 3/20, d/ced on 3/23             -antipsychotic agents: NA 5. Neuropsych: This patient is not capable of making decisions on his own behalf.  Underlying dementia  Telesitter for safety  See #14  Appreciate neuropsych eval 6. Skin/Wound Care: Routine pressure relief measures.  7. Fluids/Electrolytes/Nutrition: Monitor I/O.  8. Hypothyroid: Continue Synthroid 166mcg.  TSH elevated on 3/24, likely stress related 9. Epileptogenic potential: Conitnue Keppra 500 mg bid for seizure  No seizures from admission -3/27 10. Hypokalemia:   Potassium 4.2 on 3/25, labs ordered for Monday  Daily supplement initiated, decreased on 3/26 11. Macrocytic anemia:   Hemoglobin 8.7 on 3/23, labs ordered for Monday  Vitamin B12 within normal limits  Folate WNL  Hemoccult negative  Continue to monitor 12.  Hypoalbuminemia  Supplement initiated on 3/4 13.  Severe Oral Dysphagia and mild to moderate pharyngeal dysphagia:   D1 honey thick liquids  Suplemental IV fluids DC'd on 3/23  BMP within acceptable range on 3/25 14. Lethargy/decreased attention: Medications reviewed and he is not taking any overly sedating medications.   Sleep chart ordered, consider meds accordingly  Melatonin reordered on 3/22, increased on 3/23  Improving 15. Severe kyphosis: position with pillows when sleeping and sitting in chair, placed nursing order.  16.  Labile blood pressure   Vitals:   11/11/19 0538  11/11/19 1523  BP: 129/65 (!) 103/59  Pulse: 75 80  Resp:  16  Temp: 97.6 F (36.4 C) 98.7 F (37.1 C)  SpO2: 99% 93%   Controlled on 3/27 17.  Sleep disturbance  See #14 18. Impaired cognition secondary to dementia and encephalitis:   Repeat EEG with some improvement  MRI on 3/20 stable 20.  Hyperglycemia  Secondary to dextrose infusion, DC'd  Continue to monitor  LOS: 24 days A FACE TO FACE EVALUATION WAS PERFORMED  Eric Larsen 11/11/2019, 5:31 PM

## 2019-11-12 ENCOUNTER — Inpatient Hospital Stay (HOSPITAL_COMMUNITY): Payer: Medicare Other | Admitting: Speech Pathology

## 2019-11-12 NOTE — Progress Notes (Signed)
PHYSICAL MEDICINE & REHABILITATION PROGRESS NOTE  Subjective/Complaints: Patient seen sitting in bed this morning.  He slept on/off overnight per sleep chart.  Slept on and off per nursing  ROS: Unable to accurately assess due to cognition  Objective: Vital Signs: Blood pressure 100/75, pulse 77, temperature 98 F (36.7 C), temperature source Oral, resp. rate 16, height 5\' 4"  (1.626 m), weight 50.8 kg, SpO2 97 %. No results found. No results for input(s): WBC, HGB, HCT, PLT in the last 72 hours. No results for input(s): NA, K, CL, CO2, GLUCOSE, BUN, CREATININE, CALCIUM in the last 72 hours.  Physical Exam: BP 100/75 (BP Location: Left Arm)   Pulse 77   Temp 98 F (36.7 C) (Oral)   Resp 16   Ht 5\' 4"  (1.626 m)   Wt 50.8 kg   SpO2 97%   BMI 19.21 kg/m   Constitutional: No distress . Vital signs reviewed. HENT: Normocephalic.  Atraumatic. Eyes: EOMI. No discharge. Cardiovascular: No JVD. Respiratory: Normal effort.  No stridor. GI: Non-distended. Skin: Warm and dry.  Intact. Psych: Confused. Musc: No edema in extremities.  No tenderness in extremities. Neurologic: Alert and oriented x1 Motor: Grossly 4+/5 throughout, appears stable  Assessment/Plan: 1. 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  Physiatrist is providing close team supervision and 24 hour management of active medical problems listed below.  Physiatrist and rehab team continue to assess barriers to discharge/monitor patient progress toward functional and medical goals  Care Tool:  Bathing    Body parts bathed by patient: Right arm, Left arm, Chest, Abdomen, Front perineal area   Body parts bathed by helper: Buttocks     Bathing assist Assist Level: Minimal Assistance - Patient > 75%     Upper Body Dressing/Undressing Upper body dressing   What is the patient wearing?: Pull  over shirt    Upper body assist Assist Level: Minimal Assistance - Patient > 75%    Lower Body Dressing/Undressing Lower body dressing      What is the patient wearing?: Incontinence brief, Pants     Lower body assist Assist for lower body dressing: Maximal Assistance - Patient 25 - 49%     Toileting Toileting    Toileting assist Assist for toileting: Maximal Assistance - Patient 25 - 49%     Transfers Chair/bed transfer  Transfers assist     Chair/bed transfer assist level: Minimal Assistance - Patient > 75%     Locomotion Ambulation   Ambulation assist      Assist level: Minimal Assistance - Patient > 75% Assistive device: Walker-rolling Max distance: 80ft   Walk 10 feet activity   Assist     Assist level: Minimal Assistance - Patient > 75% Assistive device: Walker-rolling   Walk 50 feet activity   Assist Walk 50 feet with 2 turns activity did not occur: Safety/medical concerns(fatigued)  Assist level: Minimal Assistance - Patient > 75% Assistive device: Walker-rolling    Walk 150 feet activity   Assist Walk 150 feet activity did not occur: Safety/medical concerns(fatigued)  Assist level: Minimal Assistance - Patient > 75% Assistive device: Walker-rolling    Walk 10 feet on uneven surface  activity   Assist Walk 10 feet on uneven surfaces activity did not occur: Safety/medical concerns(fatigued)   Assist level: Minimal Assistance - Patient > 75% Assistive device: Aeronautical engineer Will patient use  wheelchair at discharge?: Yes Type of Wheelchair: Manual    Wheelchair assist level: Dependent - Patient 0%      Wheelchair 50 feet with 2 turns activity    Assist        Assist Level: Dependent - Patient 0%   Wheelchair 150 feet activity     Assist     Assist Level: Dependent - Patient 0%      Medical Problem List and Plan: 1.  Cognitive deficits with unintelligible language,  weakness, secondary to herpes encephalitis with history of dementia.  Continue CIR             Completed course of IV acyclovir on 3/21, no changed per ID.   Appreciate Palliative care recs - plan for SNF with hospice.  2.  Antithrombotics: -DVT/anticoagulation:  Pharmaceutical: Lovenox             -antiplatelet therapy: NA 3. Pain Management: PRN medications. Lidocaine patch ordered for left shoulder.    Appears controlled on 3/28 4. Mood: Team support  Methylphenidate 5 mg daily started on 3/20, d/ced on 3/23             -antipsychotic agents: NA 5. Neuropsych: This patient is not capable of making decisions on his own behalf.  Underlying dementia  Telesitter for safety  See #14  Appreciate neuropsych eval 6. Skin/Wound Care: Routine pressure relief measures.  7. Fluids/Electrolytes/Nutrition: Monitor I/O.  8. Hypothyroid: Continue Synthroid 114mcg.  TSH elevated on 3/24, likely stress related 9. Epileptogenic potential: Conitnue Keppra 500 mg bid for seizure  No seizures from admission -3/28 10. Hypokalemia:   Potassium 4.2 on 3/25, labs ordered for tomorrow  Daily supplement initiated, decreased on 3/26 11. Macrocytic anemia:   Hemoglobin 8.7 on 3/23, labs ordered for tomorrow  Vitamin B12 within normal limits  Folate WNL  Hemoccult negative  Continue to monitor 12.  Hypoalbuminemia  Supplement initiated on 3/4 13.  Severe Oral Dysphagia and mild to moderate pharyngeal dysphagia:   D1 honey thick liquids  Suplemental IV fluids DC'd on 3/23  BMP within acceptable range on 3/25 14. Lethargy/decreased attention: Medications reviewed and he is not taking any overly sedating medications.   Melatonin reordered on 3/22, increased on 3/23  Some improvement overall, patient with inconsistent sleeping at baseline during the night and during the day. 15. Severe kyphosis: position with pillows when sleeping and sitting in chair, placed nursing order.  16.  Labile blood  pressure   Vitals:   11/11/19 2114 11/12/19 0547  BP: 107/73 100/75  Pulse: 90 77  Resp:    Temp: 100.1 F (37.8 C) 98 F (36.7 C)  SpO2: 96% 97%   Controlled on 3/28 17.  Sleep disturbance  See #14 18. Impaired cognition secondary to dementia and encephalitis:   Repeat EEG with some improvement  MRI on 3/20 stable 20.  Hyperglycemia  Secondary to dextrose infusion, DC'd  Continue to monitor  LOS: 25 days A FACE TO FACE EVALUATION WAS PERFORMED  Maryjane Benedict Lorie Phenix 11/12/2019, 12:29 PM

## 2019-11-12 NOTE — Progress Notes (Addendum)
Speech Language Pathology Daily Session Note  Patient Details  Name: Eric Larsen MRN: [id number] Date of Birth: 1930/12/06  Today's Date: 11/12/2019 SLP Individual Time: 1245-1317 SLP Individual Time Calculation (min): 32 min  Short Term Goals: Week 4: SLP Short Term Goal 1 (Week 4): STGs = LTGs d/t remaining ELOS  Skilled Therapeutic Interventions: Pt was seen for skilled ST targeting cognitive goals. [name] was present and supportive at bedside. Pt required Total A to describe and sequence 2-step action cards. Max A required for recall and problem solving to sort cards by color (blue or yellow), increased Total A required to then attempt to sort by shape. Max A verbal cues were required for sustained attention to tasks today, in addition to soft but persistent encouragement for participation. Setting a specific time restraint did assist in getting pt to participate further, however ultimately he refused to continue and missed 13 mins skilled ST today. Pt left sitting in wheelchair with seatbelt alarm in place and wife present. Continue per current plan of care.     Pain Pain Assessment Pain Scale: Faces Faces Pain Scale: No hurt  Therapy/Group: Individual Therapy  Arbutus Leas 11/12/2019, 7:21 AM

## 2019-11-13 ENCOUNTER — Inpatient Hospital Stay (HOSPITAL_COMMUNITY): Payer: Medicare Other | Admitting: Occupational Therapy

## 2019-11-13 ENCOUNTER — Inpatient Hospital Stay (HOSPITAL_COMMUNITY): Payer: Medicare Other | Admitting: Speech Pathology

## 2019-11-13 ENCOUNTER — Inpatient Hospital Stay (HOSPITAL_COMMUNITY): Payer: Medicare Other | Admitting: Physical Therapy

## 2019-11-13 ENCOUNTER — Inpatient Hospital Stay (HOSPITAL_COMMUNITY): Payer: Medicare Other

## 2019-11-13 LAB — CBC WITH DIFFERENTIAL/PLATELET
Abs Immature Granulocytes: 0.11 10*3/uL — ABNORMAL HIGH (ref 0.00–0.07)
Basophils Absolute: 0.1 10*3/uL (ref 0.0–0.1)
Basophils Relative: 0 %
Eosinophils Absolute: 0.1 10*3/uL (ref 0.0–0.5)
Eosinophils Relative: 0 %
HCT: 33.2 % — ABNORMAL LOW (ref 39.0–52.0)
Hemoglobin: 10.8 g/dL — ABNORMAL LOW (ref 13.0–17.0)
Immature Granulocytes: 1 %
Lymphocytes Relative: 9 %
Lymphs Abs: 1 10*3/uL (ref 0.7–4.0)
MCH: 35.4 pg — ABNORMAL HIGH (ref 26.0–34.0)
MCHC: 32.5 g/dL (ref 30.0–36.0)
MCV: 108.9 fL — ABNORMAL HIGH (ref 80.0–100.0)
Monocytes Absolute: 0.9 10*3/uL (ref 0.1–1.0)
Monocytes Relative: 8 %
Neutro Abs: 9 10*3/uL — ABNORMAL HIGH (ref 1.7–7.7)
Neutrophils Relative %: 82 %
Platelets: 344 10*3/uL (ref 150–400)
RBC: 3.05 MIL/uL — ABNORMAL LOW (ref 4.22–5.81)
RDW: 24.9 % — ABNORMAL HIGH (ref 11.5–15.5)
WBC: 11.1 10*3/uL — ABNORMAL HIGH (ref 4.0–10.5)
nRBC: 0 % (ref 0.0–0.2)

## 2019-11-13 LAB — BASIC METABOLIC PANEL
Anion gap: 8 (ref 5–15)
BUN: 26 mg/dL — ABNORMAL HIGH (ref 8–23)
CO2: 28 mmol/L (ref 22–32)
Calcium: 9.8 mg/dL (ref 8.9–10.3)
Chloride: 106 mmol/L (ref 98–111)
Creatinine, Ser: 1.04 mg/dL (ref 0.61–1.24)
GFR calc Af Amer: >60 mL/min (ref >60)
GFR calc non Af Amer: >60 mL/min (ref >60)
Glucose, Bld: 139 mg/dL — ABNORMAL HIGH (ref 70–99)
Potassium: 4 mmol/L (ref 3.5–5.1)
Sodium: 142 mmol/L (ref 135–145)

## 2019-11-13 NOTE — Care Management (Signed)
Patient ID: Eric Larsen, male   DOB: Apr 10, 1931, 86 y.o.   MRN: [id number] Three SNF bed offers received. Notified Wife of bed offers and numbers to the facilities so she can gather data and is to let me know her preference of facilities to pursue placement and accept the bed offer from. Also notified wife that Dr. Sima Matas is available to meet with her and or the two children tomorrow afternoon. She stated an understanding of the information and faxed same info per request to her telephone.

## 2019-11-13 NOTE — Progress Notes (Signed)
SLP Cancellation Note  Patient Details Name: [name] MRN: [id number] DOB: 1930-11-27   Cancelled treatment:       Patient missed 30 minutes of skilled SLP intervention due to fatigue. Will re-attempt as schedule allows. Continue with current plan of care.                                                                                                 Eric Larsen 11/13/2019, 3:20 PM

## 2019-11-13 NOTE — Plan of Care (Signed)
  Problem: Consults Goal: RH GENERAL PATIENT EDUCATION Description: See Patient Education module for education specifics. Outcome: Progressing   Problem: RH BOWEL ELIMINATION Goal: RH STG MANAGE BOWEL WITH ASSISTANCE Description: STG Manage Bowel with Assistance. Mod  Outcome: Progressing   Problem: RH SKIN INTEGRITY Goal: RH STG MAINTAIN SKIN INTEGRITY WITH ASSISTANCE Description: STG Maintain Skin Integrity With Assistance. Mod Outcome: Progressing

## 2019-11-13 NOTE — Progress Notes (Signed)
Physical Therapy Session Note  Patient Details  Name: Eric Larsen MRN: [id number] Date of Birth: [birth year]  Today's Date: 11/13/2019 PT Individual Time: 7412-8786 PT Individual Time Calculation (min): 28 min   Short Term Goals: Week 3:  PT Short Term Goal 1 (Week 3): Will be able to perform bed mobility consistantly with mod A PT Short Term Goal 1 - Progress (Week 3): Met PT Short Term Goal 2 (Week 3): Pt will perform bed<>chair transfer with LRAD consistantly with mod A PT Short Term Goal 2 - Progress (Week 3): Met PT Short Term Goal 3 (Week 3): Pt will perform car transfer with LRAD mod A PT Short Term Goal 3 - Progress (Week 3): Met  Skilled Therapeutic Interventions/Progress Updates: Pt presents sitting in recliner, finishing lunch.  Daughter present in room during therapy session.  Pt required min A for sit to stand transfers from recliner and eventually from arm chair w/ occasional verbal cues for hand placement.  Pt amb multiple trials w/ RW and min A up to 60' today.  Pt required verbal and manual cues for posture and walker management , looking for room number.  Pt w/ noted incontinence and amb into BR, but refusing to sit to use commode.  Pt stood w/ min to CGA to doff/don pants and brief w/ mod A stepping into w/o LOB.  Pt amb to recliner w/ verbal cues for safe approach all the way to the chair, attempting to sit before reaching.  Pt left up in recliner w/ all needs in reach and chair alarm on.  Family members present during session.     Therapy Documentation Precautions:  Precautions Precautions: Fall Precaution Comments: dementia, slow processing/initiation/poor attention Restrictions Weight Bearing Restrictions: No General:   Vital Signs:   Pain: no c/o pain.      Therapy/Group: Individual Therapy  [name] 11/13/2019, 3:24 PM

## 2019-11-13 NOTE — Progress Notes (Signed)
[place] PHYSICAL MEDICINE & REHABILITATION PROGRESS NOTE  Subjective/Complaints:  Daughter (who is out of town but now is in [place])  with multiple questions about sleep, d/c , pt behavior  ROS: Unable to accurately assess due to cognition  Objective: Vital Signs: Blood pressure 108/69, pulse 71, temperature 97.9 F (36.6 C), temperature source Oral, resp. rate 18, height 5\' 4"  (1.626 m), weight 50.8 kg, SpO2 100 %. No results found. No results for input(s): WBC, HGB, HCT, PLT in the last 72 hours. No results for input(s): NA, K, CL, CO2, GLUCOSE, BUN, CREATININE, CALCIUM in the last 72 hours.  Physical Exam: BP 108/69 (BP Location: Right Arm)   Pulse 71   Temp 97.9 F (36.6 C) (Oral)   Resp 18   Ht 5\' 4"  (1.626 m)   Wt 50.8 kg   SpO2 100%   BMI 19.21 kg/m    General: No acute distress Mood and affect are appropriate Heart: Regular rate and rhythm no rubs murmurs or extra sounds Lungs: Clear to auscultation, breathing unlabored, no rales or wheezes Abdomen: Positive bowel sounds, soft nontender to palpation, nondistended Extremities: No clubbing, cyanosis, or edema Skin: No evidence of breakdown, no evidence of rash  Musculoskeletal: Full range of motion in all 4 extremities. No joint swelling Motor: pt apraxic, difficult to assess MMT, grossly 4/5 in BUE and BLE  Sensation - cannot assess due to cognition  Assessment/Plan: 1. 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  Physiatrist is providing close team supervision and 24 hour management of active medical problems listed below.  Physiatrist and rehab team continue to assess barriers to discharge/monitor patient progress toward functional and medical goals  Care Tool:  Bathing    Body parts bathed by patient: Right arm, Left arm, Chest, Abdomen, Front perineal area   Body parts bathed by helper:  Buttocks     Bathing assist Assist Level: Minimal Assistance - Patient > 75%     Upper Body Dressing/Undressing Upper body dressing   What is the patient wearing?: Pull over shirt    Upper body assist Assist Level: Minimal Assistance - Patient > 75%    Lower Body Dressing/Undressing Lower body dressing      What is the patient wearing?: Incontinence brief, Pants     Lower body assist Assist for lower body dressing: Maximal Assistance - Patient 25 - 49%     Toileting Toileting    Toileting assist Assist for toileting: Maximal Assistance - Patient 25 - 49%     Transfers Chair/bed transfer  Transfers assist     Chair/bed transfer assist level: Minimal Assistance - Patient > 75%     Locomotion Ambulation   Ambulation assist      Assist level: Minimal Assistance - Patient > 75% Assistive device: Walker-rolling Max distance: 95ft   Walk 10 feet activity   Assist     Assist level: Minimal Assistance - Patient > 75% Assistive device: Walker-rolling   Walk 50 feet activity   Assist Walk 50 feet with 2 turns activity did not occur: Safety/medical concerns(fatigued)  Assist level: Minimal Assistance - Patient > 75% Assistive device: Walker-rolling    Walk 150 feet activity   Assist Walk 150 feet activity did not occur: Safety/medical concerns(fatigued)  Assist level: Minimal Assistance - Patient > 75% Assistive device: Walker-rolling    Walk 10 feet on uneven surface  activity   Assist Walk 10  feet on uneven surfaces activity did not occur: Safety/medical concerns(fatigued)   Assist level: Minimal Assistance - Patient > 75% Assistive device: Aeronautical engineer Will patient use wheelchair at discharge?: Yes Type of Wheelchair: Manual    Wheelchair assist level: Dependent - Patient 0%      Wheelchair 50 feet with 2 turns activity    Assist        Assist Level: Dependent - Patient 0%   Wheelchair 150  feet activity     Assist     Assist Level: Dependent - Patient 0%      Medical Problem List and Plan: 1.  Cognitive deficits with unintelligible language, weakness, secondary to herpes encephalitis with history of dementia.  Continue CIR- PT, OT, SLP 15/7             Completed course of IV acyclovir on 3/21, no changed per ID.   Appreciate Palliative care recs - plan for SNF with hospice.  2.  Antithrombotics: -DVT/anticoagulation:  Pharmaceutical: Lovenox             -antiplatelet therapy: NA 3. Pain Management: PRN medications. Lidocaine patch ordered for left shoulder.    Appears controlled on 3/28 4. Mood: Team support  Methylphenidate 5 mg daily started on 3/20, d/ced on 3/23             -antipsychotic agents: NA 5. Neuropsych: This patient is not capable of making decisions on his own behalf.  Underlying dementia  Telesitter for safety Daughter would like to meet with neuropsych to understand behavior better   Appreciate neuropsych eval 6. Skin/Wound Care: Routine pressure relief measures.  7. Fluids/Electrolytes/Nutrition: Monitor I/O.  8. Hypothyroid: Continue Synthroid 147mcg.  TSH elevated on 3/24, likely stress related 9. Epileptogenic potential: Conitnue Keppra 500 mg bid for seizure  No seizures from admission -3/28, no apparent sedation  10. Hypokalemia:   Potassium 4.2 on 3/25, labs ordered for tomorrow  Daily supplement initiated, decreased on 3/26 11. Macrocytic anemia:   Hemoglobin 8.7 on 3/23, labs ordered for tomorrow  Vitamin B12 within normal limits  Folate WNL  Hemoccult negative  Continue to monitor 12.  Hypoalbuminemia  Supplement initiated on 3/4 13.  Severe Oral Dysphagia and mild to moderate pharyngeal dysphagia:   D1 honey thick liquids  Suplemental IV fluids DC'd on 3/23  BMP within acceptable range on 3/25 14. Lethargy/decreased attention: Medications reviewed and he is not taking any overly sedating medications.   Melatonin reordered  on 3/22, increased on 3/23  Some improvement overall, patient with inconsistent sleeping at baseline during the night and during the day. Still only sleeping ~4hr per day  15. Severe kyphosis: position with pillows when sleeping and sitting in chair, placed nursing order.  16.  Labile blood pressure   Vitals:   11/12/19 2035 11/13/19 0516  BP: 110/65 108/69  Pulse: 91 71  Resp: 18 18  Temp: 97.8 F (36.6 C) 97.9 F (36.6 C)  SpO2: 93% 100%   Controlled on 3/29 17.  Sleep disturbance  See #14 18. Impaired cognition secondary to dementia and encephalitis:   Repeat EEG with some improvement  MRI on 3/20 stable   LOS: 26 days A FACE TO FACE EVALUATION WAS PERFORMED  Charlett Blake 11/13/2019, 8:41 AM

## 2019-11-13 NOTE — Progress Notes (Signed)
Physical Therapy Session Note  Patient Details  Name: Eric Larsen MRN: [id number] Date of Birth: Mar 28, 1931  Today's Date: 11/13/2019 PT Individual Time: 0911-1000 PT Individual Time Calculation (min): 49 min   Short Term Goals: Week 3:  PT Short Term Goal 1 (Week 3): Will be able to perform bed mobility consistantly with mod A PT Short Term Goal 1 - Progress (Week 3): Met PT Short Term Goal 2 (Week 3): Pt will perform bed<>chair transfer with LRAD consistantly with mod A PT Short Term Goal 2 - Progress (Week 3): Met PT Short Term Goal 3 (Week 3): Pt will perform car transfer with LRAD mod A PT Short Term Goal 3 - Progress (Week 3): Met Week 4:  PT Short Term Goal 1 (Week 4): STG=LTG due to LOS  Skilled Therapeutic Interventions/Progress Updates:   Received pt sitting in WC, pt agreeable to therapy, and denied any pain during session. Pt's daughter present at bedside with questions regarding how to make pt more comfortable while sitting upright during the day; agreed to try recliner at end of session. Session focused on functional mobility/transfers, dressing, LE strength, dynamic standing balance/coordination, stair navigation, ambulation, and improved activity tolerance. Pt transferred sit<>stand with RW min A and required mod A to pull pants over hips. Pt transported to gym in [facility] total assist for time management purposes. Pt navigated 4 steps with 2 rails min A ascending and descending with a step to pattern. Pt ambulated 25f with RW min A and max verbal cues for RW safety, stepping sequence, and technique. Pt required tactile cues to guide RW for safety. [facility] Pt ambulated 129fwith RW min A to recliner. Concluded session with pt sitting in recliner, needs within reach, and seatbelt alarm on. Pt's daughter present at bedside.   Therapy Documentation Precautions:  Precautions Precautions: Fall Precaution Comments: dementia, slow  processing/initiation/poor attention Restrictions Weight Bearing Restrictions: No  Therapy/Group: Individual Therapy AnAlfonse AlpersT, DPT   11/13/2019, 7:33 AM

## 2019-11-13 NOTE — Progress Notes (Signed)
Occupational Therapy Weekly Progress Note  Patient Details  Name: Eric Larsen MRN: [id number] Date of Birth: January 19, 1931  Beginning of progress report period: November 05, 2019 End of progress report period: November 13, 2019  Today's Date: 11/13/2019 OT Individual Time: [id number] and 1428-1500 OT Individual Time Calculation (min): 60 min and 32 min   Short term goals not set due to estimated length of stay.  Pt is making slow progress towards goals with mobility at an overall CGA with RW and min cues for sequencing and safety during mobility. Pt continues to require min-mod assist for LB dressing due to sequencing and difficulty bending forward, however due to cognitive impairments pt is not able to utilize AE for LB dressing.  Extensive education is ongoing with pt's wife and daughters regarding meeting pt at his current cognitive level and frustration level when engaging him in functional tasks as pt fluctuates day to day.  Educated on focus on familiar, functional tasks to increase pt motivation and engagement with family reporting varying levels of understanding.    Patient continues to demonstrate the following deficits: muscle weakness,decreased midline orientation,decreased initiation, decreased attention, decreased awareness, decreased problem solving, decreased safety awareness, decreased memory and delayed processingand decreased sitting balance, decreased standing balance, decreased postural control and decreased balance strategies and therefore will continue to benefit from skilled OT intervention to enhance overall performance with BADL and Reduce care partner burden.  See Patient's Care Plan for progression toward long term goals.  Patient progressing toward long term goals..  Continue plan of care.  Skilled Therapeutic Interventions/Progress Updates:    1) Treatment session with focus on functional self-care tasks with focus on increased initiation.  Pt received slumped down in  recliner, educated pt and family members on increased upright sitting posture for pain relief and increased ability to participate in functional tasks.  Provided dycem to place under pad in chair to decrease sliding forward in sitting and reclined position.  Pt able to tie drawstring on pants with increased time and able to doff and don shirt with increased time and only verbal cues for setup.  Pt ambulated to sink with RW with CGA.  Initially attempted to comb hair in standing, however pt requesting to sit.  Pt combed hair and brushed teeth in sitting with only setup of items.  Pt able to apply toothpaste and brush teeth with only initial cue.  Ambulated back to recliner with RW with CGA and left upright in recliner with seat belt alarm on and all needs in reach.  2) Treatment session with focus on functional mobility and activity tolerance.  Pt received upright in recliner unable to recall or report on how eating lunch went.  [name] present and stated that he fed himself every bite and that she "prayed" with each bite and did not cue him about bitesize.  Encouraged her to provide cues if she feels he is unsafe, while still encouraging autonomy during functional, motivating tasks.  Pt given choice of shaving or ambulation with pt choosing to ambulate.  Pt ambulated 100' x2 with RW with CGA and min cues for upright posture.  Pt stopping to greet his son as he walked down the hall, but then kept going.  Returned to room and left upright in recliner with seat belt alarm on and all needs in reach.  Therapy Documentation Precautions:  Precautions Precautions: Fall Precaution Comments: dementia, slow processing/initiation/poor attention Restrictions Weight Bearing Restrictions: No Pain:  Pt with no c/o pain during AM  or PM session   Therapy/Group: Individual Therapy  [name] 11/13/2019, 3:14 PM

## 2019-11-14 ENCOUNTER — Inpatient Hospital Stay (HOSPITAL_COMMUNITY): Payer: Medicare Other | Admitting: Physical Therapy

## 2019-11-14 ENCOUNTER — Encounter (HOSPITAL_COMMUNITY): Payer: Medicare Other | Admitting: Psychology

## 2019-11-14 ENCOUNTER — Inpatient Hospital Stay (HOSPITAL_COMMUNITY): Payer: Medicare Other | Admitting: Occupational Therapy

## 2019-11-14 ENCOUNTER — Inpatient Hospital Stay (HOSPITAL_COMMUNITY): Payer: Medicare Other | Admitting: Speech Pathology

## 2019-11-14 NOTE — Progress Notes (Signed)
[place] PHYSICAL MEDICINE & REHABILITATION PROGRESS NOTE  Subjective/Complaints: Patient seen sitting up in bed this morning he slept on and off overnight per sleep chart.  Daughter from [place] present and conferenced with other daughter via cell phone with questions regarding agitation, diet, dysphagia, functionality, medications, sleep, discharge plans, neuropsych, etc.  ROS: Unable to accurately assess due to cognition  Objective: Vital Signs: Blood pressure 114/76, pulse 75, temperature (!) 97.5 F (36.4 C), resp. rate 18, height 5\' 4"  (1.626 m), weight 50.8 kg, SpO2 99 %. No results found. Recent Labs    11/13/19 1838  WBC 11.1*  HGB 10.8*  HCT 33.2*  PLT 344   Recent Labs    11/13/19 1838  NA 142  K 4.0  CL 106  CO2 28  GLUCOSE 139*  BUN 26*  CREATININE 1.04  CALCIUM 9.8    Physical Exam: BP 114/76 (BP Location: Left Arm)   Pulse 75   Temp (!) 97.5 F (36.4 C)   Resp 18   Ht 5\' 4"  (1.626 m)   Wt 50.8 kg   SpO2 99%   BMI 19.21 kg/m   Constitutional: No distress . Vital signs reviewed. HENT: Normocephalic.  Atraumatic. Eyes: EOMI. No discharge. Cardiovascular: No JVD. RRR Respiratory: Normal effort.  No stridor. B/l CLAT. GI: Non-distended. Skin: Warm and dry.  Intact. Psych: Confused Musc: No edema in extremities.  No tenderness in extremities. Neuro: Alert and oriented x1 Motor: Grossly 4+/5 in BUE and BLE   Assessment/Plan: 1. 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  Physiatrist is providing close team supervision and 24 hour management of active medical problems listed below.  Physiatrist and rehab team continue to assess barriers to discharge/monitor patient progress toward functional and medical goals  Care Tool:  Bathing    Body parts bathed by patient: Right arm, Left arm, Chest, Abdomen, Front perineal area   Body  parts bathed by helper: Buttocks     Bathing assist Assist Level: Minimal Assistance - Patient > 75%     Upper Body Dressing/Undressing Upper body dressing   What is the patient wearing?: Pull over shirt    Upper body assist Assist Level: Supervision/Verbal cueing    Lower Body Dressing/Undressing Lower body dressing      What is the patient wearing?: Incontinence brief, Pants     Lower body assist Assist for lower body dressing: Maximal Assistance - Patient 25 - 49%     Toileting Toileting    Toileting assist Assist for toileting: Maximal Assistance - Patient 25 - 49%     Transfers Chair/bed transfer  Transfers assist     Chair/bed transfer assist level: Minimal Assistance - Patient > 75%     Locomotion Ambulation   Ambulation assist      Assist level: Minimal Assistance - Patient > 75% Assistive device: Walker-rolling Max distance: 60'   Walk 10 feet activity   Assist     Assist level: Minimal Assistance - Patient > 75% Assistive device: Walker-rolling   Walk 50 feet activity   Assist Walk 50 feet with 2 turns activity did not occur: Safety/medical concerns(fatigued)  Assist level: Minimal Assistance - Patient > 75% Assistive device: Walker-rolling    Walk 150 feet activity   Assist Walk 150 feet activity did not occur: Safety/medical concerns(fatigued)  Assist level: Minimal Assistance - Patient > 75% Assistive device: Walker-rolling    Walk 10 feet  on uneven surface  activity   Assist Walk 10 feet on uneven surfaces activity did not occur: Safety/medical concerns(fatigued)   Assist level: Minimal Assistance - Patient > 75% Assistive device: Aeronautical engineer Will patient use wheelchair at discharge?: Yes Type of Wheelchair: Manual    Wheelchair assist level: Dependent - Patient 0%      Wheelchair 50 feet with 2 turns activity    Assist        Assist Level: Dependent - Patient 0%    Wheelchair 150 feet activity     Assist     Assist Level: Dependent - Patient 0%      Medical Problem List and Plan: 1.  Cognitive deficits with unintelligible language, weakness, secondary to herpes encephalitis with history of dementia.  Continue CIR             Completed course of IV acyclovir on 3/21, no changed per ID.   Appreciate Palliative care recs - plan for SNF with hospice, beds identified, however daughter is now interested in taking patient home with caregiver support.  She is going to discuss with patient's wife.  2.  Antithrombotics: -DVT/anticoagulation:  Pharmaceutical: Lovenox             -antiplatelet therapy: NA 3. Pain Management: PRN medications. Lidocaine patch ordered for left shoulder.    Appears controlled on 3/29 4. Mood: Team support  Methylphenidate 5 mg daily started on 3/20, d/ced on 3/23             -antipsychotic agents: NA 5. Neuropsych: This patient is not capable of making decisions on his own behalf.  Underlying dementia  Telesitter for safety  Daughter would like to meet with neuropsych to understand behavior better, plan for meeting today  Appreciate neuropsych eval 6. Skin/Wound Care: Routine pressure relief measures.  7. Fluids/Electrolytes/Nutrition: Monitor I/O.  8. Hypothyroid: Continue Synthroid 170mcg.  TSH elevated on 3/24, likely stress related 9. Epileptogenic potential: Conitnue Keppra 500 mg bid for seizure  No seizures from admission -3/30 10. Hypokalemia:   Potassium 4.0 on 3/29  Daily supplement initiated, decreased on 3/26 11. Macrocytic anemia:   Hemoglobin 10.8 on 3/29, likely concentrated  Vitamin B12 within normal limits  Folate WNL  Hemoccult negative  Continue to monitor 12.  Hypoalbuminemia  Supplement initiated on 3/4 13.  Severe Oral Dysphagia and mild to moderate pharyngeal dysphagia:   D1 honey thick liquids  Suplemental IV fluids DC'd on 3/23  BMP within acceptable range on 3/29 14.  Lethargy/decreased attention: Medications reviewed and he is not taking any overly sedating medications.   Melatonin reordered on 3/22, increased on 3/23  Some improvement overall, patient with inconsistent sleeping at baseline during the night and during the day, unchanged 15. Severe kyphosis: position with pillows when sleeping and sitting in chair, placed nursing order.  16.  Labile blood pressure   Vitals:   11/13/19 2025 11/14/19 0551  BP: 129/77 114/76  Pulse: 89 75  Resp: 18 18  Temp: 98.7 F (37.1 C) (!) 97.5 F (36.4 C)  SpO2: 96% 99%   Controlled on 3/30 17.  Sleep disturbance  See #14 18. Impaired cognition secondary to dementia and encephalitis:   Repeat EEG with some improvement  MRI on 3/20 stable  > 35 minutes spent in total in counseling and coordination of care regarding after mentioned discussions with daughters as well as with Education officer, museum, therapists, and covering MD.  LOS: 27 days A  FACE TO FACE EVALUATION WAS PERFORMED  Brier Firebaugh Lorie Phenix 11/14/2019, 10:05 AM

## 2019-11-14 NOTE — Plan of Care (Signed)
  Problem: RH Dressing Goal: LTG Patient will perform lower body dressing w/assist (OT) Description: LTG: Patient will perform lower body dressing with assist, with/without cues in positioning using equipment (OT) Flowsheets (Taken 11/14/2019 1539) LTG: Pt will perform lower body dressing with assistance level of: (downgraded due to slow progress) -- Note: Downgraded due to slow progress

## 2019-11-14 NOTE — Care Management (Signed)
Patient ID: Eric Larsen, male   DOB: Feb 26, 1931, 86 y.o.   MRN: [id number] Spoke with daughter Jonelle Sidle via phone. Discussed goals and discharge options. Reviewed three SNF bed offers and she requested contact information for the facilities and it was sent along with private duty list for private pay care givers outside the [facility] PT, OT, SLP services that would be set up should the patient be discharged to the home instead of SNF placement. Dtr noted that talking about the situation in front of the patient upsets him and requested all conversations about discharge be held outside of the room.Reviewed process for SNF placement and setting up [facility] and actions depend on the destination selected for discharge. Team conference scheduled tomorrow would review current functional status and recommendations for continued follow up and discussion of discharge so as to determine and DME needs. Daughter Tiffany expressed understanding of the information reviewed and requested information was emailed to her.

## 2019-11-14 NOTE — Progress Notes (Signed)
Occupational Therapy Session Note  Patient Details  Name: Eric Larsen MRN: [id number] Date of Birth: [birth year]  Today's Date: 11/14/2019 OT Individual Time: 0852-1000 and 1302-1350 OT Individual Time Calculation (min): 68 min and 48 min   Short Term Goals: No short term goals set  Skilled Therapeutic Interventions/Progress Updates:    1) Treatment session with focus on self-care retraining with focus on familiar task of bathing at shower level.  Pt received upright in w/c with daughter present for entire session.  Pt agreeable to shower this session. Ambulated to room shower with RW with increasing cues as pt approached shower.  Once in shower, pt initially refusing shower.  Reoriented to situation with pt agreeable.  Max assist to undress as pt still somewhat hesitant.  Once water on, pt only agreeable to washing hair.  Max assist from therapist for hair washing and management of shower head.  Pt completed drying and dressing from w/c in bathroom with setup for items and increased time.  Pt required max assist for LB dressing with able to pull pants over knees and thighs, but requiring assist to thread over feet.  Pt donned shirt with setup.  Pt ambulated back to room with RW with Min assist.  Engaged in oral care and brushing hair in sitting with setup for items only.  Pt with increased agitation during this session, but able to redirect throughout.  Pt returned to recliner Min assist with RW and left upright with seat belt alarm on.    Pt's daughter, [name], asking questions about best d/c plan and location as she would prefer to have him in a "home" setting for increased familiarity and reports she would be willing/able to provide his care.  Plan to further discuss with team and continue to explore all options.    2) Treatment session with focus on functional mobility and hands on training with daughter, [name].  Pt agreeable to therapy session outside for increased mood.  Engaged in  ambulation over uneven ground with focus on increased attention and awareness in min-mod distracting environment as others were outside.  Educated pt's daughter and wife on recommendation for one caregiver to take the lead with cues to decrease distraction and confusion.  Pt ambulated 100' and 3' with RW with min-mod cues for obstacle negotiation when turning.  Pt's daughter provided CGA for ambulation and demonstrated good awareness and technique when cuing pt.  Returned to room and transferred back to recliner with seat belt alarm.  Pt encouraged to rest until next session.  Therapy Documentation Precautions:  Precautions Precautions: Fall Precaution Comments: dementia, slow processing/initiation/poor attention Restrictions Weight Bearing Restrictions: No Vital Signs: Therapy Vitals Temp: 97.8 F (36.6 C) Temp Source: Oral Pulse Rate: 88 Resp: (!) 21 BP: 138/79 Patient Position (if appropriate): Lying Oxygen Therapy SpO2: 100 % O2 Device: Room Air Pain:  Pt with no c/o pain in AM or PM session.   Therapy/Group: Individual Therapy  [name] 11/14/2019, 3:23 PM

## 2019-11-14 NOTE — Progress Notes (Signed)
Physical Therapy Session Note  Patient Details  Name: Jacques Popke MRN: [id number] Date of Birth: 01/10/1931  Today's Date: 11/14/2019 PT Individual Time: 1120-1145 PT Individual Time Calculation (min): 25 min   Short Term Goals: Week 4:  PT Short Term Goal 1 (Week 4): STG=LTG due to LOS  Skilled Therapeutic Interventions/Progress Updates: Pt presented in recliner with family present agreeable to therapy. Session focused on hands on training with daughter. Pt then becoming more resistive to transfer as possibly distracted due to wife and Pam, [place] discussing d/c options. Pt eventually able to perform STS from recliner with minA however PTA unable to provide verbal cues due to pt's increased agitation. After brief "reset" pt able to perform STS with minA. Pt ambulated to nsg station with dgt providing w/c follow then after seated rest dgt performed CGA STS from w/c and dgt able to provide appropriate cues. Pt then ambulated back to room with dgt guarding and returned to recliner. Per family request ended session early due to general increased agitation. Pt left in recliner with belt alarm on, call bell within reach and family present.      Therapy Documentation Precautions:  Precautions Precautions: Fall Precaution Comments: dementia, slow processing/initiation/poor attention Restrictions Weight Bearing Restrictions: No General: PT Amount of Missed Time (min): 20 Minutes PT Missed Treatment Reason: Increased agitation Vital Signs:  Pain:   Mobility:   Locomotion :    Trunk/Postural Assessment :    Balance:   Exercises:   Other Treatments:      Therapy/Group: Individual Therapy  Cassady Turano 11/14/2019, 12:28 PM

## 2019-11-14 NOTE — Progress Notes (Signed)
Speech Language Pathology Daily Session Note  Patient Details  Name: Eric Larsen MRN: [id number] Date of Birth: 06-16-1931  Today's Date: 11/14/2019 SLP Individual Time: [id number] SLP Individual Time Calculation (min): 50 min  Short Term Goals: Week 4: SLP Short Term Goal 1 (Week 4): STGs = LTGs d/t remaining ELOS  Skilled Therapeutic Interventions: Skilled ST intervention targeting education with pt/family. SLP reviewed basic swallow anatomy and physiology, demonstrated thickening of grape juice to honey thick liquid for communion this Thursday. Written information regarding commercial thickeners, sample packets of simply thick gel, and information regarding continuing po intake with known risks of aspiration was provided and reviewed in detail. Wife and daughter asked appropriate questions, which were answered to their satisfaction. Daughter asked about the possibility of repeat testing for swallow function and diet advancement recommendations - Education was provided regarding results of recent MBS and plan to continue current diet. Recommend consideration of Mare Ferrari water protocol for quality of life, and to maximize hydration. Daughter also indicated she has observed improvement in pt's attention and recall during her interaction with him since her arrival.  Pt was left in bed with wife and daughter present in the room. Continue per plan of care.  Pain Pain Assessment Pain Scale: 0-10 Pain Score: 0-No pain  Therapy/Group: Individual Therapy  Eric Larsen B. Quentin Ore, [facility], [place] Speech Language Pathologist  Shonna Chock 11/14/2019, 4:00 PM

## 2019-11-14 NOTE — Consult Note (Signed)
Neuropsychological Consultation   Patient:   Eric Larsen   DOB:   10-11-1930  MR Number:  [id number]  Location:  [facility] [id number] [place] [address] Dept: [place]: [phone]           Date of Service:   11/14/2019  Start Time:   2 PM End Time:   3:30 PM  Provider/Observer:  Ilean Skill, Psy.D.       Clinical Neuropsychologist       Billing Code/Service: [phone]  Chief Complaint:    Eric Larsen is an 86 year old male who has a history of osteoporosis, gait disorder, hereditary spherocytosis, recent diagnosis of dementia who was admitted on 10/14/2019 with intermittent confusion and progressive weakness over the prior week.  Patient had significant cognitive deficits.  The patient's wife reported a 12-month history of problems with memory and weight loss.  He was found to be febrile with temp of 102.6 and MRI brain was completed showing left anterior medial temporal lobe thickening and T2 hyperintensity gyri with edema and small nonmasslike enhancement left anterior temporal lobe.  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  Aquaphor was initiated IV.  The patient has had significant cognitive deficits and encephalopathy.  The patient's wife reports that he had been having troubles with sundowning and worsening of symptoms over the prior 6 months.  Acute worsening recently.  The patient had significant expressive language deficits, difficulty following commands and significant weakness.  The patient's cognitive function appears to improve somewhat but he continues with significant/profound expressive language deficits that include circumlocutions and paraphasic errors.  Reason for Service:  The patient was referred for neuropsychological consultation due to significant cognitive deficits.  The patient's wife was there today for the visit.  Below is  the HPI for the current admission.  HPI:  Eric Larsen is an 86 year old male with history of osteoporosis, gait disorder, heriditary spherocytosis, recent diagnosis of dementia who was admitted on 10/14/2019 with intermittent confusion and progressive weakness x1 week.  History taken from wife due to cognition.  Wife also reports 6 month history of problems with memory and weight loss. He was found to be febrile with temp of 102.6 and MRI brain done showing left anterior medial temporal lobe thickening and T2 hyperintensity gyri with edema and small nonmasslike enhancement left anterior temporal lobe.  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  Empiric IV acyclovir was initiated.  TSH- 2.289 and levothyroxine increased to 125 mcg/day. EEG done showing sharp waves in left anterior temporal lesion with mild to moderate diffuse encephalopathy.  He was loaded with Keppra but continued to have issues with confusion and bouts of lethargy.  Hospital course further complicated by dysphagia due to tendency to hold orals and therefore he was placed on a dysphagia 1, thin liquid diet.  LT-EEG was negative for seizures but did show epileptogenicity and is to continue Keppra.  Blood cultures x2 done in 1+ for staph felt to be contaminant. LP was ordered for work-up and showed elevated protein-139 and lymphocytosis with WBC 84.  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  Patient with resultant cognitive deficits with unintelligible language, difficulty following commands, and significant weakness requiring STEDY to stand.  CIR recommended due to functional decline.  Please see preadmission assessment from earlier today as well.  Current Status:  Today was a second  appointment that I had with the patient and the patient does show some mild improvement in his overall functioning.  However, there continue to be significant expressive language deficits  with the patient being able to pull up words but having difficulty producing effective sentence structure or expressing himself adequately.  There are also some degree of confusion and mental status issues.  Memory issues are also prominent particular for auditory learning and memory which would be consistent with his recent left temporal lobe infectious process.  However, some of these deficits have been a do with dementia were present prior to this recent infection and the patient shows significant cortical atrophy.  Behavioral Observation: Eric Larsen  presents as a 86 y.o.-year-old Right Caucasian Male who appeared his stated age. his dress was Appropriate and he was Well Groomed and his manners were Appropriate to the situation.  his participation was indicative of Appropriate and Inattentive behaviors.  There were any physical disabilities noted.  he displayed an appropriate level of cooperation and motivation.     Interactions:    Active Inattentive  Attention:   abnormal and attention span appeared shorter than expected for age  Memory:   abnormal; global memory impairment noted  Visuo-spatial:  not examined  Speech (Volume):  low  Speech:   non-fluent aphasia; slurred  Thought Process:  Tangential  Though Content:  WNL; not suicidal and not homicidal  Orientation:   person, place and situation  Judgment:   Poor  Planning:   Poor  Affect:    Appropriate  Mood:    Euthymic  Insight:   Shallow  Intelligence:   normal  Medical History:   Past Medical History:  Diagnosis Date  . Hypothyroidism   . Osteoporosis   . Thyroid disease     Psychiatric History:  No prior psychiatric history noted.  Family Med/Psych History:  Family History  Problem Relation Age of Onset  . Hereditary spherocytosis Father   . Hereditary spherocytosis Sister     Impression/DX:  Eric Larsen is an 86 year old male who has a history of osteoporosis, gait disorder, hereditary  spherocytosis, recent diagnosis of dementia who was admitted on 10/14/2019 with intermittent confusion and progressive weakness over the prior week.  Patient had significant cognitive deficits.  The patient's wife reported a 80-month history of problems with memory and weight loss.  He was found to be febrile with temp of 102.6 and MRI brain was completed showing left anterior medial temporal lobe thickening and T2 hyperintensity gyri with edema and small nonmasslike enhancement left anterior temporal lobe.  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  Aquaphor was initiated IV.  The patient has had significant cognitive deficits and encephalopathy.  The patient's wife reports that he had been having troubles with sundowning and worsening of symptoms over the prior 6 months.  Acute worsening recently.  The patient had significant expressive language deficits, difficulty following commands and significant weakness.  The patient's cognitive function appears to improve somewhat but he continues with significant/profound expressive language deficits that include circumlocutions and paraphasic errors.  Today was a second appointment that I had with the patient and the patient does show some mild improvement in his overall functioning.  However, there continue to be significant expressive language deficits with the patient being able to pull up words but having difficulty producing effective sentence structure or expressing himself adequately.  There are also some degree of confusion and mental status issues.  Memory issues are also prominent  particular for auditory learning and memory which would be consistent with his recent left temporal lobe infectious process.  However, some of these deficits have been a do with dementia were present prior to this recent infection and the patient shows significant cortical atrophy.   Disposition/Plan:  The first part of today's visit was spent with the  patient himself assessing current neurobehavioral status and discussing with the patient's wife and daughter some of the symptoms including ongoing issues, past issues as well as improvements they have seen over the past week or so.  The second portion of this visit which lasted much longer was spent in a separate room with the patient's wife and one of his daughters and another daughter connecting via FaceTime.  There were a number of questions that the family had regarding what is transpired and what was likely present prior to the development of his infectious process in his left temporal lobe region.  They were questioning about what symptoms may have been present before and there was some limitation about what I could provide as I had not seen the patient prior to his recent neurological status change.  The patient continues to have significant difficulties cognitively particularly with expressive language and verbal memory and learning.  The patient has shown some mild improvements but there is an expectation that he will continue to have significant cognitive deficits and will continue to need 24-hour assistance for at least some time if not permanently.  There were questions that were asked by both of his daughters as well as his wife and we reviewed each question and answered the best I could.  They range from what is actually happened, what his status likely was prior given the findings of his recent MRI and subjective symptoms described by his wife and family members.  There was also questions about planning as far as options related to returning home or living with a family member and focusing on quality of life going forward or skilled nursing placement following his discharge here from this unit.  The family still had a number of concerns and issues to address including assessing or addressing quality of life versus longevity of life.  The patient had gait issues even before this recent sudden change  in functioning and some level of dementia prior likely related to microvascular ischemic changes and small vessel disease.  The patient does have a hereditary blood disorder (spherocytosis) that may or may not have contributed to his significant cortical atrophy and microvascular disease.  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         Electronically Signed   _______________________ Ilean Skill, Psy.D.

## 2019-11-15 ENCOUNTER — Inpatient Hospital Stay (HOSPITAL_COMMUNITY): Payer: Medicare Other

## 2019-11-15 ENCOUNTER — Inpatient Hospital Stay (HOSPITAL_COMMUNITY): Payer: Medicare Other | Admitting: Speech Pathology

## 2019-11-15 ENCOUNTER — Inpatient Hospital Stay (HOSPITAL_COMMUNITY): Payer: Medicare Other | Admitting: Occupational Therapy

## 2019-11-15 ENCOUNTER — Inpatient Hospital Stay (HOSPITAL_COMMUNITY): Payer: Medicare Other | Admitting: Physical Therapy

## 2019-11-15 NOTE — Progress Notes (Signed)
Occupational Therapy Session Note  Patient Details  Name: Eric Larsen MRN: [id number] Date of Birth: 14-Feb-1931  Today's Date: 11/15/2019 OT Individual Time: 1405-1503 OT Individual Time Calculation (min): 58 min    Short Term Goals: No short term goals set  Skilled Therapeutic Interventions/Progress Updates:    Treatment session with focus on initiation and sequencing during familiar, self-care tasks as well as functional mobility with hands on education with family.  Pt received upright in recliner, family reporting that he had wanted to shave.  Completed sit > stand from recliner with supervision, ambulated to sink with RW with CGA.  Pt sat at sink to shave, requiring increased time to initiate as pt kept opening top of electric razor.  Receptive to therapist showing pt on switch, then pt able to turn on and proceed to next step to engage in shaving face.  Pt did not use shaving lotion, per wife's encouragement or his typical routine, however was able to use trimmer and shave aspects of razor with good sequencing.  Pt agreeable to ambulating in hallway with large windows for change of scenery.  Pt with good sequencing and awareness with sit > stand, pushing up from w/c arm rests each time.  Ambulated with daughter, [name], providing CGA to close supervision while pt ambulated 130' and 120' with RW.  Pt returned to room and transferred back to recliner, left semi-reclined with seat belt on and all needs in reach.  Pt's daughter, [name], asking about d/c date.  Discussed plan to see how MBSS goes and then family/wife needs to make a decision as pt is nearing his goals here.  Discussed that pt will continue to require physical assist and 24 hr supervision upon d/c and possibly for quite a while, daughter reports understanding.  Therapy Documentation Precautions:  Precautions Precautions: Fall Precaution Comments: dementia, slow processing/initiation/poor attention Restrictions Weight Bearing  Restrictions: No General:   Vital Signs: Therapy Vitals Temp: 98.1 F (36.7 C) Temp Source: Oral Pulse Rate: 83 Resp: 17 BP: (!) 117/58 Patient Position (if appropriate): Sitting Oxygen Therapy SpO2: 98 % O2 Device: Room Air Pain:  Pt with no c/o pain   Therapy/Group: Individual Therapy  [name] 11/15/2019, 3:30 PM

## 2019-11-15 NOTE — Patient Care Conference (Signed)
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 Hypokalemia   . Sleep disturbance   . Dementia associated with other underlying disease without behavioral disturbance ([place])   . Palliative care encounter   . Lethargy   . Acute blood loss anemia   . Hypoalbuminemia due to protein-calorie malnutrition ([place])   . Microcytic anemia   . Dysphagia   . Encephalitis 10/18/2019  . Abnormal EEG   . Hypothyroidism   . Macrocytic anemia   . 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 Hip fracture ([place]) 08/12/2017  . Hallux valgus of left foot 04/28/2017  . Hammer toe of left foot 04/28/2017  . Corn of toe 04/28/2017  . History of total left hip replacement 09/19/2015  . Spherocytosis (familial) ([place]) 09/17/2015  . Mixed hyperlipidemia 06/19/2015  . Benign prostatic hyperplasia with lower urinary tract symptoms 06/19/2015  . History of fracture of vertebra 11/06/2014  . History of colon polyps 08/08/2014  . Pathological fracture 11/01/2013  . Toenail fungus 01/20/2013  . Vitamin D deficiency 09/27/2012  . Primary hypothyroidism 09/27/2012  . Osteoporosis 09/27/2012  . Spherocytosis ([place]) 12/04/2011  . Skin cancer 12/04/2011    Expected Discharge Date: Expected Discharge Date: (TBD)  Team Members Present: Physician leading conference: Dr. Delice Lesch Care Coodinator Present: Nestor Lewandowsky, RN, BSN, CRRN;Genie Adonis Yim, RN, MSN Nurse Present: Rayne Du, LPN PT Present: Becky Sax, PT OT Present: [name], OT SLP Present: Stormy Fabian, SLP PPS Coordinator present : Gunnar Fusi, SLP     Current Status/Progress Goal Weekly Team Focus  Bowel/Bladder   Incontinent / continent of B/B  LBM 03/30  obtain continence of B/B have regular bowel pattern  timed toileting laxatives prn   Swallow/Nutrition/ Hydration   dysphagia 1 with honey thick liquids  no goals at this time but may be added after repeat MBS  completion of repeat MBS   ADL's   Min A sit > stand, CGA transfers and ambulation with RW, Min A bathing, Supervision UB dressing, Mod-Max A LB dressing  Min A overall, downgraded LB dressing to mod A  ADL retraining, activity tolerance, mobility, pt/family education, d/c planning   Mobility   bed mobility supervision, stand<>pivot with RW min A, 4 steps with 2 rails min/mod A, ambulating 3ft with RW min A  min A  functional mobility/bed mobility, transfers, motor control/planning, ambulation, stair navigation, dynamic standing balance, endurance   Communication   Max A  Max A  yes/no questions/context based   Safety/Cognition/ Behavioral Observations  Total A to Max A  Total A to Max A  sustained attention, basic problem solving   Pain   Denies pt prescribed 1gm tylenol scheduled  pt will remain free of pain  assess pain qshift and prn   Skin   mild MASD perianal  improvement of MASD no skin breakdown or infection  assess skin qshift and prn    Rehab Goals Patient on target to  meet rehab goals: Yes Rehab Goals Revised: Goals set at Minimal assistance down graded due to slow progression, wife is aware of assistance need at discharge and new goals. *See Care Plan and progress notes for long and short-term goals.     Barriers to Discharge  Current Status/Progress Possible Resolutions Date Resolved   Nursing                  PT  Decreased caregiver support;Incontinence;Home environment access/layout                 OT                  SLP                SW Decreased  caregiver support;Incontinence;Behavior;Other (comments) wife and children have differing views of what is best d/c plan and discussing options FL2 out and several offers for SNF/family reviewing and and list for home private duty care givers          Discharge Planning/Teaching Needs:  Family discussing options for discharge home with caregivers or SNF placement  Toileting, transfers, medications, diet/restrictions, etc   Team Discussion: MD medically stable.  RN bottom is red, incontinent, slept well last night.  OT shower attempted, became confused, hair washed, is improving, has min A goals, mod A goals for LB dressing.  Fam ed started.  PT min A overall, min A goals, fam ed started with Dtr today.  SLP repeat MBS tomorrow.  Family will need to make a decision about home versus SNF tomorrow after MBS completed per MD.  Has Dtr and wife, but wife will be decision maker.   Revisions to Treatment Plan: N/A     Medical Summary Current Status: Cognitive deficits with unintelligible language, weakness, secondary to herpes encephalitis with history of dementia Weekly Focus/Goal: Improve cognition, sleep, dysphagia  Barriers to Discharge: Medical stability;Incontinence;Decreased family/caregiver support   Possible Resolutions to Barriers: Therapies, palliative care, family to decide on discharge venue   Continued Need for Acute Rehabilitation Level of Care: The patient requires daily medical management by a physician with specialized training in physical medicine and rehabilitation for the following reasons: Direction of a multidisciplinary physical rehabilitation program to maximize functional independence : Yes Analysis of laboratory values and/or radiology reports with any subsequent need for medication adjustment and/or medical intervention. : Yes   I attest that I was present, lead the team conference, and concur with the assessment and plan of the team.   Retta Diones 11/15/2019,  8:01 PM   Team conference was held via web/ teleconference due to [place] - 19

## 2019-11-15 NOTE — Progress Notes (Signed)
Physical Therapy Weekly Progress Note  Patient Details  Name: Eric Larsen MRN: [id number] Date of Birth: 1931-01-10  Beginning of progress report period: October 19, 2019 End of progress report period: November 15, 2019  Today's Date: 11/15/2019 PT Individual Time: 4098-1191 PT Individual Time Calculation (min): 41 min   Patient has met 8 of 9 long term goals. Short term goals not set due to estimated length of stay. Pt continues to fluctuate with mobility levels, however in general has become more consistent requiring min A overall with mobility. However, pt occasionally requiring mod A (specifically with car transfers and stair navigation) due to decreased ability to follow commands and increased confusion with complexity of task. Pt continues to demonstrate cognitive difficulties with problem solving, safety awareness, following commands, and recently with increased agitation, but able to be redirected easily. Pt's family is currently discussing d/c plan and all options available. Pt's daughter [name]) began hands on family education training with emphasis on ambulation and car transfers. Pt/family will continue to benefit from hands on practice.   Patient continues to demonstrate the following deficits muscle weakness, impaired timing and sequencing, decreased coordination and decreased motor planning and decreased standing balance, decreased postural control and decreased balance strategies and therefore will continue to benefit from skilled PT intervention to increase functional independence with mobility.  Patient progressing toward long term goals..  Continue plan of care.  PT Short Term Goals Week 4:  PT Short Term Goal 1 (Week 4): STG=LTG due to LOS PT Short Term Goal 1 - Progress (Week 4): Progressing toward goal Week 5:  PT Short Term Goal 1 (Week 5): STG=LTG due to LOS  Skilled Therapeutic Interventions/Progress Updates:  Ambulation/gait training;DME/adaptive equipment  instruction;Neuromuscular re-education;Stair training;UE/LE Strength taining/ROM;Discharge planning;Skin care/wound management;Therapeutic Activities;UE/LE Coordination activities;Cognitive remediation/compensation;Disease management/prevention;Functional mobility training;Patient/family education;Splinting/orthotics;Therapeutic Exercise;Visual/perceptual remediation/compensation;Balance/vestibular training   Today's Interventions: Received pt sitting in recliner, pt agreeable to therapy, and denied any pain during session. Pt's daughter [name] present at bedside. Session focused on d/c planning, family education training, functional mobility/transfers, LE strength, dynamic standing balance/coordination, ambulation, simulated car transfers, and improved activity tolerance. Pt ambulated 169f with RW CGA/min A with daughter and increased time to dayroom with therapist providing WC follow. Pt required verbal cues for technique and stepping sequence. Pt transferred stand<>pivot with RW CGA and was transported to ortho gym total assist. Pt performed ambulatory car transfer with daughter with RW and min A. Pt required verbal cues for hand placement, turning sequence, and safety. Pt ambulated additional 745fwith RW CGA with daughter back to room. Therapist educated pt's daughter on importance of minimizing distractions, having only one person providing direction, and clear/direct communication; pt's daughter verbalized and demonstrated understanding. Concluded session with pt sitting in recliner, needs within reach, and seatbelt alarm on.   Therapy Documentation Precautions:  Precautions Precautions: Fall Precaution Comments: dementia, slow processing/initiation/poor attention Restrictions Weight Bearing Restrictions: No  Therapy/Group: Individual Therapy AnAlfonse AlpersT, DPT   11/15/2019, 7:34 AM

## 2019-11-15 NOTE — Care Management (Signed)
Received a call today from Baird Kay from [facility]; SW hired by wife to assist with SNF placement. Contacted wife to confirm conversation and communication with SW regarding actions taken for SNF placement and follow up. Wife acknowledged approval of communication with [facility] was authorized by her and information could be shared with [facility] regarding team conference and recommendations for discharge. Reviewed current team discussion with patient's wife including current functional status, goals for discharge and current status of SNF bed offers. The wife verified understanding of current functional status and decision to pursue SNF placement. The wife noted she hired [facility] to facilitate that process for the patient.

## 2019-11-15 NOTE — Progress Notes (Signed)
Physical Therapy Session Note  Patient Details  Name: Eric Larsen MRN: [id number] Date of Birth: 11/09/1930  Today's Date: 11/15/2019 PT Individual Time: 1030-1115 PT Individual Time Calculation (min): 45 min   Short Term Goals: Week 4:  PT Short Term Goal 1 (Week 4): STG=LTG due to LOS PT Short Term Goal 1 - Progress (Week 4): Progressing toward goal  Skilled Therapeutic Interventions/Progress Updates: Pt presented in recliner with dgt and wife present agreeable to therapy. Session focused on functional mobility with dgt performing hands on training. Performed STS from recliner with CGA and RW. Pt ambulated to nsg station 19f with close supervision provided by dgt and PTA providing w/c follow. Dgt was able to provide appropriate cues for safely negotiating obstacles when needed. Pt transported remaining distance to rehab gym and participated in ascending/descending stair training. Pt performed first bout with PTA use of BUE and both step to vs step through pattern with close S. After seated rest pt performed second bout with dgt in same manner as PTA. Pt then transported to ADL apt and performed ambulatory transfer to couch CGA from dgt with verbal cues for controlled descent. Pt required minA to stand from couch. Discussed with dgt possibly using quilt to slightly elevate and firm up seat cushion to allow increased independence from couch. Pt ambulated 866fin hallway then noted intermittent L foot catching on floor. Advised dgt sign of possible fatigue. Pt transported back to room and performed ambulatory transfer w/c to recliner. Pt left sitting comfortably in recliner with belt alarm on, call bell within reach and needs met.    Therapy Documentation Precautions:  Precautions Precautions: Fall Precaution Comments: dementia, slow processing/initiation/poor attention Restrictions Weight Bearing Restrictions: No General: PT Amount of Missed Time (min): 15 Minutes PT Missed Treatment  Reason: Patient fatigue Vital Signs:   Pain: Pain Assessment Pain Scale: 0-10 Pain Score: 0-No pain   Therapy/Group: Individual Therapy  Elvy Mclarty  Ayush Boulet, PTA  11/15/2019, 11:31 AM

## 2019-11-15 NOTE — Progress Notes (Signed)
[place] PHYSICAL MEDICINE & REHABILITATION PROGRESS NOTE  Subjective/Complaints: Patient seen laying in bed this morning.  He slept on and off overnight.  Had prolonged discussion with daughter again regarding discharge, functionality, swallowing, aspiration, hospice, family dynamics, etc.  ROS: Unable to accurately assess due to cognition  Objective: Vital Signs: Blood pressure 115/71, pulse 66, temperature 97.7 F (36.5 C), temperature source Oral, resp. rate 17, height 5\' 4"  (1.626 m), weight 50.8 kg, SpO2 100 %. No results found. Recent Labs    11/13/19 1838  WBC 11.1*  HGB 10.8*  HCT 33.2*  PLT 344   Recent Labs    11/13/19 1838  NA 142  K 4.0  CL 106  CO2 28  GLUCOSE 139*  BUN 26*  CREATININE 1.04  CALCIUM 9.8    Physical Exam: BP 115/71 (BP Location: Right Arm)   Pulse 66   Temp 97.7 F (36.5 C) (Oral)   Resp 17   Ht 5\' 4"  (1.626 m)   Wt 50.8 kg   SpO2 100%   BMI 19.21 kg/m   Constitutional: No distress . Vital signs reviewed. HENT: Normocephalic.  Atraumatic. Eyes: EOMI. No discharge. Cardiovascular: No JVD. Respiratory: Normal effort.  No stridor. GI: Non-distended. Skin: Warm and dry.  Intact. Psych: Confused. Musc: No edema in extremities.  No tenderness in extremities. Neuro: Alert Motor: Grossly 4+/5 in BUE and BLE, appears unchanged  Assessment/Plan: 1. 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  Physiatrist is providing close team supervision and 24 hour management of active medical problems listed below.  Physiatrist and rehab team continue to assess barriers to discharge/monitor patient progress toward functional and medical goals  Care Tool:  Bathing  Bathing activity did not occur: Refused Body parts bathed by patient: Right arm, Left arm, Chest, Abdomen, Front perineal area   Body parts bathed by helper: Buttocks      Bathing assist Assist Level: Minimal Assistance - Patient > 75%     Upper Body Dressing/Undressing Upper body dressing   What is the patient wearing?: Pull over shirt    Upper body assist Assist Level: Supervision/Verbal cueing    Lower Body Dressing/Undressing Lower body dressing      What is the patient wearing?: Incontinence brief, Pants     Lower body assist Assist for lower body dressing: Maximal Assistance - Patient 25 - 49%     Toileting Toileting    Toileting assist Assist for toileting: Maximal Assistance - Patient 25 - 49%     Transfers Chair/bed transfer  Transfers assist     Chair/bed transfer assist level: Minimal Assistance - Patient > 75%     Locomotion Ambulation   Ambulation assist      Assist level: Minimal Assistance - Patient > 75% Assistive device: Walker-rolling Max distance: 60'   Walk 10 feet activity   Assist     Assist level: Minimal Assistance - Patient > 75% Assistive device: Walker-rolling   Walk 50 feet activity   Assist Walk 50 feet with 2 turns activity did not occur: Safety/medical concerns(fatigued)  Assist level: Minimal Assistance - Patient > 75% Assistive device: Walker-rolling    Walk 150 feet activity   Assist Walk 150 feet activity did not occur: Safety/medical concerns(fatigued)  Assist level: Minimal Assistance - Patient > 75% Assistive device: Walker-rolling    Walk 10 feet on uneven surface  activity   Assist Walk 10 feet on  uneven surfaces activity did not occur: Safety/medical concerns(fatigued)   Assist level: Minimal Assistance - Patient > 75% Assistive device: Aeronautical engineer Will patient use wheelchair at discharge?: Yes Type of Wheelchair: Manual    Wheelchair assist level: Dependent - Patient 0%      Wheelchair 50 feet with 2 turns activity    Assist        Assist Level: Dependent - Patient 0%   Wheelchair 150 feet activity      Assist     Assist Level: Dependent - Patient 0%      Medical Problem List and Plan: 1.  Cognitive deficits with unintelligible language, weakness, secondary to herpes encephalitis with history of dementia.  Continue CIR  Team conference today to discuss current and goals and coordination of care, home and environmental barriers, and discharge planning with nursing, case manager, and therapies.              Completed course of IV acyclovir on 3/21, no changed per ID.   Appreciate Palliative care recs - plan for SNF with hospice, beds identified, family continues to be in disagreement regarding discharge management. 2.  Antithrombotics: -DVT/anticoagulation:  Pharmaceutical: Lovenox             -antiplatelet therapy: NA 3. Pain Management: PRN medications. Lidocaine patch ordered for left shoulder.    Appears controlled on 3/29 4. Mood: Team support  Methylphenidate 5 mg daily started on 3/20, d/ced on 3/23             -antipsychotic agents: NA 5. Neuropsych: This patient is not capable of making decisions on his own behalf.  Underlying dementia  Telesitter for safety  Discussed with neuropsych.  Appreciate neuropsych meeting with family.  Appreciate neuropsych eval 6. Skin/Wound Care: Routine pressure relief measures.  7. Fluids/Electrolytes/Nutrition: Monitor I/O.  8. Hypothyroid: Continue Synthroid 171mcg.  TSH elevated on 3/24, likely stress related 9. Epileptogenic potential: Conitnue Keppra 500 mg bid for seizure  No seizures from admission -3/31 10. Hypokalemia:   Potassium 4.0 on 3/29  Daily supplement initiated, decreased on 3/26 11. Macrocytic anemia:   Hemoglobin 10.8 on 3/29, likely concentrated, plan to order labs at the end of the week  Vitamin B12 within normal limits  Folate WNL  Hemoccult negative  Continue to monitor 12.  Hypoalbuminemia  Supplement initiated on 3/4 13.  Severe Oral Dysphagia and mild to moderate pharyngeal dysphagia:   D1 honey  thick liquids  Suplemental IV fluids DC'd on 3/23  BMP within acceptable range on 3/29 14. Lethargy/decreased attention: Medications reviewed and he is not taking any overly sedating medications.   Melatonin reordered on 3/22, increased on 3/23  Some improvement overall, patient with inconsistent sleeping at baseline during the night and during the day, stable 15. Severe kyphosis: position with pillows when sleeping and sitting in chair, placed nursing order.  16.  Labile blood pressure   Vitals:   11/14/19 2032 11/15/19 0610  BP: 111/74 115/71  Pulse: 100 66  Resp:  17  Temp: 98.5 F (36.9 C) 97.7 F (36.5 C)  SpO2: 98% 100%   Controlled on 3/30 17.  Sleep disturbance  See #14 18. Impaired cognition secondary to dementia and encephalitis:   Repeat EEG with some improvement  MRI on 3/20 stable  > 35 minutes spent in total in counseling regarding after mentioned with daughter as well as with Education officer, museum, therapists.  LOS: 28 days A FACE TO FACE  EVALUATION WAS PERFORMED  Cassell Voorhies Lorie Phenix 11/15/2019, 10:19 AM

## 2019-11-15 NOTE — Progress Notes (Signed)
Speech Language Pathology Daily Session Note  Patient Details  Name: Eric Larsen MRN: [id number] Date of Birth: 26-May-1931  Today's Date: 11/15/2019 SLP Individual Time: 0830-0900 SLP Individual Time Calculation (min): 30 min  Short Term Goals: Week 4: SLP Short Term Goal 1 (Week 4): STGs = LTGs d/t remaining ELOS  Skilled Therapeutic Interventions:  Skilled treatment sesison targeted pt's cognition goals. SLP recieved pt upright in his recliner with daughter Eric Larsen) present. Pt was self-feeding breakfast. He was Mod I with selective attention to self-feeding for ~ 20 minutes in mildly distracting environment. Of note, pt with complete oral clearing and no oral holding was observed. Addtionally, pt with improved functional responses to SLP questions from previous sessions. Pt has experienced improved abilities in attention and basic understanding of tasks (such as it is breakfast so he needs to eat breakfast). Pt has been consuming dypshaiga 1 with honey thick liquids wihtout any decline in respiratory statues and his cognitive status is improved over previous MBS. As such, I recommend a repeat MBS on 11/16/19 to assess current swallow abilities. Education was provided on initial plan of care with MBS with pt's daughter. All questions were answered to pt and daughter's satisfaction. Pt handed off to PT.       Pain    Therapy/Group: Individual Therapy  [name] 11/15/2019, 9:50 AM

## 2019-11-16 ENCOUNTER — Inpatient Hospital Stay (HOSPITAL_COMMUNITY): Payer: Medicare Other

## 2019-11-16 ENCOUNTER — Encounter (HOSPITAL_COMMUNITY): Payer: Medicare Other | Admitting: Speech Pathology

## 2019-11-16 ENCOUNTER — Inpatient Hospital Stay (HOSPITAL_COMMUNITY): Payer: Medicare Other | Admitting: Occupational Therapy

## 2019-11-16 LAB — SARS CORONAVIRUS 2 (TAT 6-24 HRS): SARS Coronavirus 2: NEGATIVE

## 2019-11-16 MED ORDER — LEVOTHYROXINE SODIUM 125 MCG PO TABS: 125.0000 ug | ORAL_TABLET | Freq: Every day | ORAL | Status: DC

## 2019-11-16 MED ORDER — FOLIC ACID 1 MG PO TABS: 1.0000 mg | ORAL_TABLET | Freq: Every day | ORAL | Status: DC

## 2019-11-16 MED ORDER — MELATONIN 3 MG PO TABS: 3.0000 mg | ORAL_TABLET | Freq: Every day | ORAL | 0 refills | Status: DC

## 2019-11-16 MED ORDER — LIDOCAINE 5 % EX PTCH: 1.0000 | MEDICATED_PATCH | CUTANEOUS | 0 refills | Status: DC

## 2019-11-16 MED ORDER — FLUTICASONE PROPIONATE 50 MCG/ACT NA SUSP: 1.0000 | Freq: Every day | NASAL | 2 refills | Status: DC

## 2019-11-16 MED ORDER — LEVETIRACETAM 500 MG PO TABS: 500.0000 mg | ORAL_TABLET | Freq: Two times a day (BID) | ORAL | Status: DC

## 2019-11-16 NOTE — Progress Notes (Signed)
Occupational Therapy Session Note  Patient Details  Name: Emmanuelle Coxe MRN: [id number] Date of Birth: Apr 03, 1931  Today's Date: 11/16/2019 OT Individual Time: 1300-1400 OT Individual Time Calculation (min): 60 min    Short Term Goals: Week 2:  OT Short Term Goal 1 (Week 2): Pt will maintain static sitting balance with supervision for at least 3 mins in perparation for selfcare tasks. OT Short Term Goal 1 - Progress (Week 2): Met OT Short Term Goal 2 (Week 2): Pt will complete UB bathing with no more than min instructional cueing for initiation and sequencing. OT Short Term Goal 2 - Progress (Week 2): Progressing toward goal OT Short Term Goal 3 (Week 2): Pt will donn a pullover shirt with no more than min assist in supported sitting for two consecutive trials. OT Short Term Goal 3 - Progress (Week 2): Met OT Short Term Goal 4 (Week 2): Pt will complete toilet transfers with min assist for two consecutive trials OT Short Term Goal 4 - Progress (Week 2): Met OT Short Term Goal 5 (Week 2): Pt will complete LB dressing with mod assist at sit > stand level OT Short Term Goal 5 - Progress (Week 2): Progressing toward goal Week 3:  OT Short Term Goal 1 (Week 3): STG = LTGs due to remaining ELOS OT Short Term Goal 1 - Progress (Week 3): Progressing toward goal  Skilled Therapeutic Interventions/Progress Updates:    1:1 Pt was finishing up his lunch when arrived with daughter present. Pt does present more verbal and able to clearly state his wants and needs. Pt ambulated to the bathroom with contact guard to the toilet. Pt had been incontinent of urine in brief (total A to doff soiled brief) and mod A to doff clothing sitting on the toilet. With shower turned on pt transitioned into shower with grab bars with HHA. Pt declined to wash hair / get head wet. Pt able to wash all parts with contact guard sit to stand with mod to max cues to thoroughly wash all parts. Pt did perseverate on washing LEs  but could be redirected. Ambulated out to the sink and engaged in dressing. UB shirt with setup and LB with mod A and A to don hospital socks and then could slide on shoes. A pt with shaving at the sink with pt providing some directions. Pt ambulated back to the recliner with contact guard and extra time. PT left resting in the recliner with LE elevated with lap belt donned.   Therapy Documentation Precautions:  Precautions Precautions: Fall Precaution Comments: slow processing/initiation/poor attention Restrictions Weight Bearing Restrictions: No Pain:  no c/o pain in session   Therapy/Group: Individual Therapy  [name] [facility] 11/16/2019, 3:19 PM

## 2019-11-16 NOTE — Progress Notes (Signed)
Physical Therapy Discharge Summary  Patient Details  Name: Eric Larsen MRN: [id number] Date of Birth: [birth year]  Patient has met 10 of 10 long term goals due to improved activity tolerance, improved balance, improved postural control, increased strength, improved attention, improved awareness and improved coordination. Patient to discharge at a wheelchair level [place]. Pt's family (wife and daughter Eric Larsen) participated in a few therapy sessions throughout pt's stay. Pt's daughter, Eric Larsen, verbalized and demonstrated confidence with ambulation, car transfers, and stair navigation. However, pt continues to require min A overall for cognition and mobility with plans to D/C to SNF.   All goals met  Recommendation:  Patient will benefit from ongoing skilled PT services in skilled nursing facility setting to continue to advance safe functional mobility, address ongoing impairments in cognition, transfers, LE strength, dynamic standing balance/coordination, ambulation, endurance, and to minimize fall risk.  Equipment: No equipment provided  Reasons for discharge: treatment goals met and discharge from hospital  Patient/family agrees with progress made and goals achieved: Yes  PT Discharge Precautions/Restrictions Precautions Precautions: Fall Precaution Comments: slow processing/initiation/poor attention Restrictions Weight Bearing Restrictions: No Cognition Overall Cognitive Status: Impaired/Different from baseline Arousal/Alertness: Awake/alert Orientation Level: Oriented to person;Disoriented to time;Disoriented to situation;Oriented to place Memory: Impaired Awareness: Impaired Problem Solving: Impaired Safety/Judgment: Impaired Comments: Pt with poor safety awareness Sensation Sensation Light Touch: Appears Intact Proprioception: Appears Intact Additional Comments: slight difficulty with assessment due to dysarthria Coordination Gross Motor Movements are Fluid and  Coordinated: No Fine Motor Movements are Fluid and Coordinated: No Coordination and Movement Description: grossly uncoordinated due to decreased motor planning/initation, decreased ability to follow commands, and generalized weakness/severe kyphosis Finger Nose Finger Test: mild dysmetria bilaterally Heel Shin Test: decreased ROM and uncoordinated bilaterally Motor  Motor Motor: Abnormal postural alignment and control Motor - Skilled Clinical Observations: grossly uncoordinated due to poor motor planning, decreased initiation and ability to follow commands, and generalized weakness/severe kyphosis  Mobility Bed Mobility Bed Mobility: Rolling Right;Rolling Left;Supine to Sit;Sit to Supine Rolling Right: Supervision/verbal cueing Rolling Left: Supervision/Verbal cueing Supine to Sit: Supervision/Verbal cueing Sit to Supine: Supervision/Verbal cueing Transfers Transfers: Sit to Stand;Stand to Sit;Stand Pivot Transfers Sit to Stand: Minimal Assistance - Patient > 75%(RW) Stand to Sit: Minimal Assistance - Patient > 75%(RW) Stand Pivot Transfers: Minimal Assistance - Patient > 75%(RW) Stand Pivot Transfer Details: Verbal cues for sequencing;Verbal cues for technique;Verbal cues for precautions/safety;Verbal cues for safe use of DME/AE Stand Pivot Transfer Details (indicate cue type and reason): verbal cues for hand placement, stepping sequence, turning technique, and RW safety Transfer (Assistive device): Rolling walker Locomotion  Gait Ambulation: Yes Gait Assistance: Minimal Assistance - Patient > 75%(RW) Gait Distance (Feet): 150 Feet Assistive device: Rolling walker Gait Assistance Details: Verbal cues for sequencing;Verbal cues for technique;Verbal cues for precautions/safety;Verbal cues for gait pattern;Verbal cues for safe use of DME/AE Gait Assistance Details: verbal cues for RW safety, stepping sequence, posture, and technique Gait Gait: Yes Gait Pattern: Impaired Gait Pattern:  Step-to pattern;Decreased step length - right;Decreased step length - left;Decreased stride length;Decreased trunk rotation;Narrow base of support;Poor foot clearance - left;Poor foot clearance - right;Trunk flexed Gait velocity: decreased Stairs / Additional Locomotion Stairs: Yes Stairs Assistance: Minimal Assistance - Patient > 75% Stair Management Technique: Two rails Number of Stairs: 4 Height of Stairs: 6 Ramp: Minimal Assistance - Patient >75%(RW) Wheelchair Mobility Wheelchair Mobility: Yes Wheelchair Assistance: Dependent - Patient 0% Wheelchair Parts Management: Needs assistance Distance: 175f  Trunk/Postural Assessment  Cervical Assessment Cervical Assessment: Exceptions to WFL(forward  head) Thoracic Assessment Thoracic Assessment: Exceptions to WFL(kyphosis) Lumbar Assessment Lumbar Assessment: Exceptions to WFL(posterior pelvic tilt) Postural Control Postural Control: Deficits on evaluation  Balance Balance Balance Assessed: Yes Static Sitting Balance Static Sitting - Balance Support: Feet unsupported;Bilateral upper extremity supported Static Sitting - Level of Assistance: 5: Stand by assistance(supervision) Dynamic Sitting Balance Dynamic Sitting - Balance Support: Bilateral upper extremity supported;Feet unsupported Dynamic Sitting - Level of Assistance: 5: Stand by assistance(CGA) Static Standing Balance Static Standing - Balance Support: Bilateral upper extremity supported(RW) Static Standing - Level of Assistance: 5: Stand by assistance(CGA) Dynamic Standing Balance Dynamic Standing - Balance Support: Bilateral upper extremity supported(RW) Dynamic Standing - Level of Assistance: 4: Min assist Extremity Assessment  RLE Assessment RLE Assessment: Exceptions to [facility] General Strength Comments: grossly generalized to 4/5 LLE Assessment LLE Assessment: Exceptions to [facility] General Strength Comments: grossly generalized to 4/5  Jaeley Wiker M Carroll Ranney Annice Jolly PT,  DPT  11/16/2019, 12:17 PM

## 2019-11-16 NOTE — Progress Notes (Signed)
Pt R eye noted to have a small amount of yellow/green drainage. Pt denies pain with the eye. Pts eye was cleaned with warm compress.

## 2019-11-16 NOTE — Care Management (Signed)
   The overall goal for the admission was met for:   Discharge location:SNF  Length of Stay: 31 days with discharge 11/17/19  Discharge activity level:Min-Mod Assist  Home/community participation:Limited  Services provided included: MD, RD, PT, OT, SLP, RN, CM, TR, Pharmacy, [place]: Medicare  Comments (or additional information): [facility] [address] [place],  [address] [phone] Fax [phone]  Patient/Family verbalized understanding of follow-up arrangements: Yes  Individual responsible for coordination of the follow-up plan: Wife [phone]  Transportation to facility arranged with [facility].  Margarito Liner

## 2019-11-16 NOTE — Progress Notes (Signed)
Physical Therapy Session Note  Patient Details  Name: Eric Larsen MRN: [id number] Date of Birth: [birth year]  Today's Date: 11/16/2019 PT Individual Time: 1100-1156 and 1445-1527  PT Individual Time Calculation (min): 56 min and 42 min  Short Term Goals: Week 4:  PT Short Term Goal 1 (Week 4): STG=LTG due to LOS PT Short Term Goal 1 - Progress (Week 4): Progressing toward goal Week 5:  PT Short Term Goal 1 (Week 5): STG=LTG due to LOS  Skilled Therapeutic Interventions/Progress Updates:   Treatment Session 1: 1100-1156 56 min Received pt supine in bed, pt agreeable to therapy, and denied any pain during session. Daughter, [name] present at bedside. Session focused on functional mobility/transfers, LE strength, dynamic standing balance/coordination, ambulation, and improved endurance with activity. Pt performed bed mobility with supervision and transferred bed<>WC stand<>pivot with RW min A. Pt transported to dayroom in [facility] total assist. Pt ambulated 144ft with RW min A with increased time. Pt continues to demonstrated flexed trunk, decreased trunk rotation, and narrow BOS with gait. Pt transported to ortho gym in [facility] total assist. Pt picked up small cup from floor using RW min A. Pt ambulated 58ft on uneven surfaces (ramp) with RW min A. Pt performed bilateral LE strengthening on Kinetron at 80cm/sec for 1 minute x 2 trials with supervision. Pt transported back to room in [facility] total assist. Pt ambulated 39ft with RW min A to recliner. Concluded session with pt sitting in recliner, needs within reach, and seatbelt alarm on. Daughter present at bedside.   Treatment Session 2: 1445-1527 42 min Received pt rolling into hallway in recliner with NT, pt agreeable to therapy, and denied any pain during session. [name] and daughter [name] present at bedside. Session focused on functional mobility/transfers, LE strength, dynamic standing balance/coordination, ambulation, and improved endurance with activity.  Pt with increased agitation this afternoon. Pt transferred sit<>stand with RW min A and ambulated 119ft with RW min A with pt's wife. Pt refused to practice stair navigation this afternoon but was agreeable to the Nustep. Pt transferred stand<>pivot with RW min A to Nustep however once on Nustep pt became agitated and refused to perform activity. When therapist asked pt what he wanted to do he said "I want to rest". Pt transferred stand<>pivot Nustep<>WC with RW min A to rest in [place]. Therapist attempted to provide emotional support and encouragement to calm pt and pt initially receptive however quickly becoming agitated again. Pt requested to return to room and was transported back to room in [facility] total assist. Pt ambulated 16ft with RW min A to recliner. Concluded session with pt sitting in recliner, needs within reach, and seatbelt alarm on. Pt's family members in hallway making phone calls.   Therapy Documentation Precautions:  Precautions Precautions: Fall Precaution Comments: dementia, slow processing/initiation/poor attention Restrictions Weight Bearing Restrictions: No  Therapy/Group: Individual Therapy Alfonse Alpers PT, DPT   11/16/2019, 7:35 AM

## 2019-11-16 NOTE — Progress Notes (Signed)
[facility] PHYSICAL MEDICINE & REHABILITATION PROGRESS NOTE  Subjective/Complaints: Patient seen laying in bed this AM.  He slept fairly overnight per nursing, sleep chart not updated.    ROS: Unable to assess due to cognition  Objective: Vital Signs: Blood pressure 118/75, pulse (!) 59, temperature 98.1 F (36.7 C), temperature source Oral, resp. rate 18, height 5\' 4"  (1.626 m), weight 50.8 kg, SpO2 99 %. No results found. Recent Labs    11/13/19 1838  WBC 11.1*  HGB 10.8*  HCT 33.2*  PLT 344   Recent Labs    11/13/19 1838  NA 142  K 4.0  CL 106  CO2 28  GLUCOSE 139*  BUN 26*  CREATININE 1.04  CALCIUM 9.8    Physical Exam: BP 118/75 (BP Location: Right Arm)   Pulse (!) 59   Temp 98.1 F (36.7 C) (Oral)   Resp 18   Ht 5\' 4"  (1.626 m)   Wt 50.8 kg   SpO2 99%   BMI 19.21 kg/m   Constitutional: No distress . Vital signs reviewed. HENT: Normocephalic.  Atraumatic. Eyes: EOMI. No discharge. Cardiovascular: No JVD. Respiratory: Normal effort.  No stridor. GI: Non-distended. Skin: Warm and dry.  Intact. Psych: Confused.  Restless.   Musc: No edema in extremities.  No tenderness in extremities. Neuro: Alert Motor: Grossly 4+/5 in BUE and BLE, appears stable  Assessment/Plan: 1. 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  Physiatrist is providing close team supervision and 24 hour management of active medical problems listed below.  Physiatrist and rehab team continue to assess barriers to discharge/monitor patient progress toward functional and medical goals  Care Tool:  Bathing  Bathing activity did not occur: Refused Body parts bathed by patient: Right arm, Left arm, Chest, Abdomen, Front perineal area   Body parts bathed by helper: Buttocks     Bathing assist Assist Level: Minimal Assistance - Patient > 75%     Upper Body  Dressing/Undressing Upper body dressing   What is the patient wearing?: Pull over shirt    Upper body assist Assist Level: Supervision/Verbal cueing    Lower Body Dressing/Undressing Lower body dressing      What is the patient wearing?: Incontinence brief, Pants     Lower body assist Assist for lower body dressing: Maximal Assistance - Patient 25 - 49%     Toileting Toileting    Toileting assist Assist for toileting: Maximal Assistance - Patient 25 - 49%     Transfers Chair/bed transfer  Transfers assist     Chair/bed transfer assist level: Minimal Assistance - Patient > 75%     Locomotion Ambulation   Ambulation assist      Assist level: Minimal Assistance - Patient > 75% Assistive device: Walker-rolling Max distance: 16ft   Walk 10 feet activity   Assist     Assist level: Minimal Assistance - Patient > 75% Assistive device: Walker-rolling   Walk 50 feet activity   Assist Walk 50 feet with 2 turns activity did not occur: Safety/medical concerns(fatigued)  Assist level: Minimal Assistance - Patient > 75% Assistive device: Walker-rolling    Walk 150 feet activity   Assist Walk 150 feet activity did not occur: Safety/medical concerns(fatigued)  Assist level: Minimal Assistance - Patient > 75% Assistive device: Walker-rolling    Walk 10 feet on uneven surface  activity   Assist Walk 10 feet on uneven surfaces activity did not  occur: Safety/medical concerns(fatigued)   Assist level: Minimal Assistance - Patient > 75% Assistive device: Aeronautical engineer Will patient use wheelchair at discharge?: Yes Type of Wheelchair: Manual    Wheelchair assist level: Dependent - Patient 0%      Wheelchair 50 feet with 2 turns activity    Assist        Assist Level: Dependent - Patient 0%   Wheelchair 150 feet activity     Assist     Assist Level: Dependent - Patient 0%      Medical Problem List  and Plan: 1.  Cognitive deficits with unintelligible language, weakness, secondary to herpes encephalitis with history of dementia.  Continue CIR  Completed course of IV acyclovir on 3/21, no changed per ID.   Appreciate Palliative care recs - plan for SNF with hospice, beds identified, family in disagreement regarding discharge venue, plan for MBS today and determination of discharge disposition by family by end of today. 2.  Antithrombotics: -DVT/anticoagulation:  Pharmaceutical: Lovenox             -antiplatelet therapy: NA 3. Pain Management: PRN medications. Lidocaine patch ordered for left shoulder.    Appears controlled on 4/1 4. Mood: Team support  Methylphenidate 5 mg daily started on 3/20, d/ced on 3/23             -antipsychotic agents: NA 5. Neuropsych: This patient is not capable of making decisions on his own behalf.  Underlying dementia  Telesitter for safety  Discussed with neuropsych.  Appreciate neuropsych meeting with family.  Appreciate neuropsych eval 6. Skin/Wound Care: Routine pressure relief measures.  7. Fluids/Electrolytes/Nutrition: Monitor I/O.  8. Hypothyroid: Continue Synthroid 174mcg.  TSH elevated on 3/24, likely stress related 9. Epileptogenic potential: Conitnue Keppra 500 mg bid for seizure  No seizures from admission -4/1 10. Hypokalemia:   Potassium 4.0 on 3/29, labs ordered for tomorrow  Daily supplement initiated, decreased on 3/26 11. Macrocytic anemia:   Hemoglobin 10.8 on 3/29, likely concentrated, labs ordered for tomorrow  Vitamin B12 within normal limits  Folate WNL  Hemoccult negative  Continue to monitor 12.  Hypoalbuminemia  Supplement initiated on 3/4 13.  Severe Oral Dysphagia and mild to moderate pharyngeal dysphagia:   D1 honey thick liquids  Suplemental IV fluids DC'd on 3/23  BMP within acceptable range on 3/29, labs ordered for tomorrow   Plan for MBS today 14. Lethargy/decreased attention: Medications reviewed and he is  not taking any overly sedating medications.   Melatonin reordered on 3/22, increased on 3/23  Some improvement overall, patient with inconsistent sleeping at baseline during the night and during the day, unchanged 15. Severe kyphosis: position with pillows when sleeping and sitting in chair, placed nursing order.  16.  Labile blood pressure   Vitals:   11/15/19 2036 11/16/19 0553  BP: 124/70 118/75  Pulse: 91 (!) 59  Resp:  18  Temp:    SpO2: 99% 99%   Controlled on 4/1 17.  Sleep disturbance  See #14 18. Impaired cognition secondary to dementia and encephalitis:   Repeat EEG with some improvement  MRI on 3/20 stable  > 35 minutes spent in total in counseling regarding after mentioned with daughter as well as with Education officer, museum, therapists.  LOS: 29 days A FACE TO FACE EVALUATION WAS PERFORMED  Eric Larsen 11/16/2019, 8:46 AM

## 2019-11-16 NOTE — Discharge Summary (Addendum)
Physician Discharge Summary  Patient ID: Messiyah Volmar MRN: [id number] DOB/AGE: 1931-02-02 86 y.o.  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 Mild stool. Left hip replacement. Right femoral intramedullary rod. IMPRESSION: Nonobstructed bowel-gas pattern with contrast in the colon Electronically Signed   By: Donavan Foil M.D.   On: 11/01/2019 18:20   CT HEAD WO CONTRAST  Result Date: 11/01/2019 CLINICAL DATA:  Dysphagia.  Herpes encephalitis. EXAM: CT HEAD WITHOUT CONTRAST TECHNIQUE: Contiguous axial images were obtained from the base of the skull through the vertex without intravenous contrast. COMPARISON:  July 05, 2019. FINDINGS: Brain: No evidence of acute infarction, hemorrhage, hydrocephalus, extra-axial collection or mass lesion/mass effect. Atrophy and chronic microvascular ischemic changes are noted. Vascular: No hyperdense vessel or unexpected calcification. Skull: Normal. Negative for fracture or focal lesion. Sinuses/Orbits: There is a mucosal retention cyst within the left maxillary sinus. The remaining paranasal sinuses and mastoid air cells are essentially clear. Other: None. IMPRESSION: 1. No acute intracranial abnormality. 2. Chronic findings as detailed above. Electronically Signed   By: Constance Holster M.D.   On: 11/01/2019 16:54   MR BRAIN WO CONTRAST  Result Date: 11/04/2019 CLINICAL DATA:  Stroke follow-up EXAM: MRI HEAD WITHOUT CONTRAST TECHNIQUE: Multiplanar, multiecho pulse sequences of the brain and surrounding structures were obtained without intravenous contrast. COMPARISON:  Brain MRI 10/15/2019 FINDINGS: Brain: No acute infarct, acute hemorrhage or extra-axial collection. Early confluent hyperintense T2-weighted signal of the periventricular and deep white matter, most commonly due to chronic ischemic microangiopathy. Unchanged hyperintensity in the anterior left temporal lobe. There is generalized atrophy without lobar predilection. No chronic microhemorrhage. Normal midline structures. Vascular: Normal flow voids. Skull and upper cervical spine: Normal marrow signal. Sinuses/Orbits: Negative. Other: None. IMPRESSION: 1. No acute intracranial abnormality. 2. Unchanged findings of chronic ischemic microangiopathy and generalized atrophy. Electronically Signed   By: Ulyses Jarred M.D.   On: 11/04/2019 01:53   DG Chest Port 1 View  Result Date: 10/31/2019 CLINICAL DATA:  86 year old male with cough and congestion. EXAM: PORTABLE CHEST 1 VIEW COMPARISON:  Chest radiograph dated 10/14/2019. FINDINGS: Right-sided PICC with tip over central SVC close to the cavoatrial junction. Focal bandlike density in the right mid lung field, new since the prior radiograph, likely atelectasis. Developing infiltrate is less likely but not excluded. Clinical correlation is recommended. No lobar consolidation, pleural effusion, pneumothorax. Stable cardiac silhouette. Atherosclerotic calcification of the aorta. The aorta is tortuous. Osteopenia with degenerative changes of the spine. Old healed left rib fractures. Partially visualized left humeral head screws. No acute osseous pathology. IMPRESSION: 1. Right mid lung field atelectasis versus developing infiltrate. Follow-up recommended. 2. Right-sided PICC with  tip over central SVC. Electronically Signed   By: Anner Crete M.D.   On: 10/31/2019 19:51   DG Swallowing Func-Speech Pathology  Result Date: 11/02/2019 Objective Swallowing Evaluation: Type of Study: MBS-Modified Barium Swallow  Study  Patient Details Name: Esli Lanman MRN: [id number] Date of Birth: 1930/10/12 Today's Date: 11/02/2019 Time: SLP Start Time (ACUTE ONLY): 25 -SLP Stop Time (ACUTE ONLY): [id number] SLP Time Calculation (min) (ACUTE ONLY): 26 min Past Medical History: Past Medical History: Diagnosis Date  Hypothyroidism   Osteoporosis   Thyroid disease  Past Surgical History: Past Surgical History: Procedure Laterality Date  CHOLECYSTECTOMY    HIP FRACTURE SURGERY    INTRAMEDULLARY (IM) NAIL INTERTROCHANTERIC Right 08/13/2017  Procedure: INTRAMEDULLARY (IM) NAIL INTERTROCHANTRIC;  Surgeon: Earnestine Leys, MD;  Location: [facility] ORS;  Service: Orthopedics;  Laterality: Right;  SHOULDER SURGERY   HPI: Mr. Armor Baum is an  86 y.o. yo male w/ PMH significant for hypothyroidism, hyperlipidemia, hereditary spherocytosis and recent diagnosis of dementia presenting with one week of progressively worsening confusion.  MRI brain on 2/28 reported: "Left temporal lobe is abnormal with increased signal on T2 and FLAIR and cortical thickening and medially and anteriorly. There is a small area of non masslike enhancement in the left anterior temporal lobe. The patient was febrile on admission. 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 Infiltrating tumor also in the differential.  Subjective: Pt was awake but confused Assessment / Plan / Recommendation CHL IP CLINICAL IMPRESSIONS 11/02/2019 Clinical Impression Pt presents with severe sensorimotor oropharyngeal dypshagia that is excerbated by pt's severe cognitive impairments. Pt has severely impaired oral phase that is c/b lengthy (>15 to 20 seconds) oral holding d/t weak lingual manipulation, lingual pumping, reduced posterior propulsion,  holding of bolus, decreased bolus cohesion, delayed oral transit, premature spillage, piecemeal swallowing and oral residue requiring multiple swallow to clear boluses. Ptis pharyngeal phase is c/b delayed swallow initiation at the vallecula and pyriform sinuses, reduced pharyngeal peristalsis, that results in severe residue within the vallecula with silent aspiration of the honey thick liquids after the swallow and trace aspiration of puree. All aspiration was silent. Unfortunately pt is not able to follow directions, focus attention to task with Total A multimodal cues. At this time, I recommend that pt remain NPO. Should family wish for pt to consume PO intake with known risk of aspiration, honey thick by spoon and puree would be most appropriate. SLP Visit Diagnosis Dysphagia, oropharyngeal phase (R13.12) Attention and concentration deficit following -- Frontal lobe and executive function deficit following -- Impact on safety and function Risk for inadequate nutrition/hydration;Severe aspiration risk   CHL IP TREATMENT RECOMMENDATION 11/02/2019 Treatment Recommendations Therapy as outlined in treatment plan below   Prognosis 10/15/2019 Prognosis for Safe Diet Advancement Fair Barriers to Reach Goals Cognitive deficits;Language deficits Barriers/Prognosis Comment -- CHL IP DIET RECOMMENDATION 11/02/2019 SLP Diet Recommendations NPO Liquid Administration via -- Medication Administration Via alternative means Compensations -- Postural Changes --   CHL IP OTHER RECOMMENDATIONS 11/02/2019 Recommended Consults -- Oral Care Recommendations Oral care QID Other Recommendations --   CHL IP FOLLOW UP RECOMMENDATIONS 10/18/2019 Follow up Recommendations 24 hour supervision/assistance   CHL IP FREQUENCY AND DURATION 10/15/2019 Speech Therapy Frequency (ACUTE ONLY) min 2x/week Treatment Duration 2 weeks      CHL IP ORAL PHASE 11/02/2019 Oral Phase Impaired Oral - Pudding Teaspoon -- Oral - Pudding Cup -- Oral - Honey Teaspoon Weak  lingual manipulation;Lingual pumping;Reduced posterior propulsion;Holding of bolus;Lingual/palatal residue;Piecemeal swallowing;Premature spillage;Decreased bolus cohesion;Delayed oral transit Oral - Honey Cup -- Oral - Nectar Teaspoon NT Oral - Nectar Cup NT Oral - Nectar Straw -- Oral - Thin Teaspoon NT Oral - Thin Cup NT Oral - Thin Straw -- Oral - Puree Weak  lingual manipulation;Lingual pumping;Reduced posterior propulsion;Holding of bolus;Lingual/palatal residue;Piecemeal swallowing;Delayed oral transit;Decreased bolus cohesion;Premature spillage Oral - Mech Soft -- Oral - Regular -- Oral - Multi-Consistency -- Oral - Pill -- Oral Phase - Comment --  CHL IP PHARYNGEAL PHASE 11/02/2019 Pharyngeal Phase Impaired Pharyngeal- Pudding Teaspoon -- Pharyngeal -- Pharyngeal- Pudding Cup -- Pharyngeal -- Pharyngeal- Honey Teaspoon Delayed swallow initiation-vallecula;Delayed swallow initiation-pyriform sinuses;Reduced pharyngeal peristalsis;Reduced tongue base retraction;Penetration/Apiration after swallow;Moderate aspiration;Pharyngeal residue - valleculae Pharyngeal Material enters airway, passes BELOW cords without attempt by patient to eject out (silent aspiration) Pharyngeal- Honey Cup -- Pharyngeal -- Pharyngeal- Nectar Teaspoon NT Pharyngeal -- Pharyngeal- Nectar Cup NT Pharyngeal -- Pharyngeal- Nectar Straw -- Pharyngeal -- Pharyngeal- Thin Teaspoon NT Pharyngeal -- Pharyngeal- Thin Cup NT Pharyngeal -- Pharyngeal- Thin Straw NT Pharyngeal -- Pharyngeal- Puree Delayed swallow initiation-vallecula;Delayed swallow initiation-pyriform sinuses;Reduced pharyngeal peristalsis;Reduced tongue base retraction;Pharyngeal residue - valleculae;Trace aspiration;Penetration/Apiration after swallow Pharyngeal Material enters airway, passes BELOW cords without attempt by patient to eject out (silent aspiration) Pharyngeal- Mechanical Soft -- Pharyngeal -- Pharyngeal- Regular -- Pharyngeal -- Pharyngeal- Multi-consistency --  Pharyngeal -- Pharyngeal- Pill -- Pharyngeal -- Pharyngeal Comment --  CHL IP CERVICAL ESOPHAGEAL PHASE 11/02/2019 Cervical Esophageal Phase WFL Pudding Teaspoon -- Pudding Cup -- Honey Teaspoon -- Honey Cup -- Nectar Teaspoon -- Nectar Cup -- Nectar Straw -- Thin Teaspoon -- Thin Cup -- Thin Straw -- Puree -- Mechanical Soft -- Regular -- Multi-consistency -- Pill -- Cervical Esophageal Comment -- [name] Overton 11/02/2019, 3:31 PM              DG Swallowing Func-Speech Pathology  Result Date: 10/20/2019 Objective Swallowing Evaluation: Type of Study: MBS-Modified Barium Swallow Study  Patient Details Name: Ismaila Pelster MRN: [id number] Date of Birth: 1931/04/28 Today's Date: 10/20/2019 Time: SLP Start Time (ACUTE ONLY): 1309 -SLP Stop Time (ACUTE ONLY): 1335 SLP Time Calculation (min) (ACUTE ONLY): 26 min Past Medical History: Past Medical History: Diagnosis Date  Hypothyroidism   Osteoporosis   Thyroid disease  Past Surgical History: Past Surgical History: Procedure Laterality Date  CHOLECYSTECTOMY    HIP FRACTURE SURGERY    INTRAMEDULLARY (IM) NAIL INTERTROCHANTERIC Right 08/13/2017  Procedure: INTRAMEDULLARY (IM) NAIL INTERTROCHANTRIC;  Surgeon: Earnestine Leys, MD;  Location: [facility] ORS;  Service: Orthopedics;  Laterality: Right;  SHOULDER SURGERY   HPI: Mr. Abdellah Pierog is an  86 y.o. yo male w/ PMH significant for hypothyroidism, hyperlipidemia, hereditary spherocytosis and recent diagnosis of dementia presenting with one week of progressively worsening confusion.  MRI brain on 2/28 reported: "Left temporal lobe is abnormal with increased signal on T2 and FLAIR and cortical thickening and medially and anteriorly. There is a small area of non masslike enhancement in the left anterior temporal lobe. The patient was febrile on admission. 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 Infiltrating tumor also in the differential.  Subjective: Pt was awake but confused Assessment / Plan / Recommendation  CHL IP CLINICAL IMPRESSIONS 10/20/2019 Clinical Impression Pt presents with severe oral dysphagia and mild to moderate pharyngeal dysphagia. Pt's oral phase is severely long (>20 seconds with single bolus of puree). Pt's cognitive deficits decrease his attention to all boluses and promotes oral holding. In addition when pt is attentive to bolus he has severely decreased lingual manipulation, decreased bolus cohesion, reduced posterior propulsion, premature spillage to vallecula and piecemeal swallows with nectar thick liquids, thin liquids and puree. Pt's pharyngeal phase is c/b reduced base of tongue strength, delayed swallow initiation to vallecula and reduced pharyngeal parestalsis. As a result, pt  has moderate to severe vallecular residue throughout all consistencies. Pt's altered mental status prevents him from following any directions or using any compensatory strategies. Recommmend pt continue consuming puree diet with thin liquids, medicine crushed in puree and full nursing supervision to increase attention to bolus. SLP Visit Diagnosis -- Attention and concentration deficit following -- Frontal lobe and executive function deficit following -- Impact on safety and function Mild aspiration risk   CHL IP TREATMENT RECOMMENDATION 10/20/2019 Treatment Recommendations Therapy as outlined in treatment plan below   Prognosis 10/15/2019 Prognosis for Safe Diet Advancement Fair Barriers to Reach Goals Cognitive deficits;Language deficits Barriers/Prognosis Comment -- CHL IP DIET RECOMMENDATION 10/20/2019 SLP Diet Recommendations Dysphagia 1 (Puree) solids;Thin liquid Liquid Administration via Cup;Straw Medication Administration Crushed with puree Compensations Minimize environmental distractions;Slow rate;Small sips/bites Postural Changes Seated upright at 90 degrees   CHL IP OTHER RECOMMENDATIONS 10/20/2019 Recommended Consults -- Oral Care Recommendations Oral care BID Other Recommendations --   CHL IP FOLLOW UP  RECOMMENDATIONS 10/18/2019 Follow up Recommendations 24 hour supervision/assistance   CHL IP FREQUENCY AND DURATION 10/15/2019 Speech Therapy Frequency (ACUTE ONLY) min 2x/week Treatment Duration 2 weeks      CHL IP ORAL PHASE 10/20/2019 Oral Phase Impaired Oral - Pudding Teaspoon -- Oral - Pudding Cup -- Oral - Honey Teaspoon -- Oral - Honey Cup -- Oral - Nectar Teaspoon Holding of bolus;Piecemeal swallowing;Premature spillage;Decreased bolus cohesion;Delayed oral transit;Weak lingual manipulation;Incomplete tongue to palate contact;Lingual/palatal residue;Reduced posterior propulsion Oral - Nectar Cup Incomplete tongue to palate contact;Weak lingual manipulation;Holding of bolus;Lingual/palatal residue;Piecemeal swallowing;Decreased bolus cohesion;Premature spillage;Delayed oral transit;Reduced posterior propulsion Oral - Nectar Straw -- Oral - Thin Teaspoon Weak lingual manipulation;Incomplete tongue to palate contact;Reduced posterior propulsion;Holding of bolus;Lingual/palatal residue;Piecemeal swallowing;Delayed oral transit;Decreased bolus cohesion;Premature spillage Oral - Thin Cup Weak lingual manipulation;Lingual pumping;Reduced posterior propulsion;Incomplete tongue to palate contact;Holding of bolus;Lingual/palatal residue;Piecemeal swallowing;Delayed oral transit;Decreased bolus cohesion;Premature spillage Oral - Thin Straw -- Oral - Puree Impaired mastication;Weak lingual manipulation;Incomplete tongue to palate contact;Reduced posterior propulsion;Holding of bolus;Lingual/palatal residue;Piecemeal swallowing;Delayed oral transit;Decreased bolus cohesion;Premature spillage Oral - Mech Soft -- Oral - Regular -- Oral - Multi-Consistency -- Oral - Pill -- Oral Phase - Comment --  CHL IP PHARYNGEAL PHASE 10/20/2019 Pharyngeal Phase Impaired Pharyngeal- Pudding Teaspoon -- Pharyngeal -- Pharyngeal- Pudding Cup -- Pharyngeal -- Pharyngeal- Honey Teaspoon -- Pharyngeal -- Pharyngeal- Honey Cup -- Pharyngeal --  Pharyngeal- Nectar Teaspoon Delayed swallow initiation-vallecula;Reduced pharyngeal peristalsis;Reduced tongue base retraction;Pharyngeal residue - valleculae;Pharyngeal residue - pyriform Pharyngeal -- Pharyngeal- Nectar Cup Delayed swallow initiation-vallecula;Reduced pharyngeal peristalsis;Reduced tongue base retraction;Pharyngeal residue - valleculae;Pharyngeal residue - pyriform Pharyngeal -- Pharyngeal- Nectar Straw -- Pharyngeal -- Pharyngeal- Thin Teaspoon Delayed swallow initiation-vallecula;Reduced pharyngeal peristalsis;Reduced tongue base retraction;Pharyngeal residue - valleculae;Pharyngeal residue - pyriform Pharyngeal -- Pharyngeal- Thin Cup Delayed swallow initiation-vallecula;Reduced pharyngeal peristalsis;Reduced tongue base retraction;Pharyngeal residue - pyriform;Pharyngeal residue - valleculae Pharyngeal -- Pharyngeal- Thin Straw Delayed swallow initiation-vallecula;Reduced pharyngeal peristalsis;Reduced tongue base retraction;Pharyngeal residue - valleculae;Pharyngeal residue - pyriform Pharyngeal -- Pharyngeal- Puree Delayed swallow initiation-vallecula;Reduced pharyngeal peristalsis;Reduced tongue base retraction;Pharyngeal residue - valleculae Pharyngeal -- Pharyngeal- Mechanical Soft -- Pharyngeal -- Pharyngeal- Regular -- Pharyngeal -- Pharyngeal- Multi-consistency -- Pharyngeal -- Pharyngeal- Pill -- Pharyngeal -- Pharyngeal Comment --  CHL IP CERVICAL ESOPHAGEAL PHASE 10/20/2019 Cervical Esophageal Phase WFL Pudding Teaspoon -- Pudding Cup -- Honey Teaspoon -- Honey Cup -- Nectar Teaspoon -- Nectar Cup -- Nectar Straw -- Thin Teaspoon -- Thin Cup -- Thin Straw -- Puree -- Mechanical Soft -- Regular -- Multi-consistency -- Pill -- Cervical Esophageal  Comment -- [name] Overton 10/20/2019, 2:32 PM              EEG adult  Result Date: 11/01/2019 Lora Havens, MD     11/01/2019 11:02 AM Patient Name: Connelly Tristan MRN: [id number] Epilepsy Attending: Lora Havens Referring  Physician/Provider: Reesa Chew, PA Date: 11/01/2019 Duration: 23.54 mins Patient history: 86 year old male with progressive confusion. MRI showed left temporal lobe is abnormal with increased signal on T2 and FLAIR and cortical thickening and medially and anteriorly. There is a small area of non masslike enhancement in the left anterior temporal lobe. EEG to evaluate for seizure.    Level of alertness: awake   AEDs during EEG study: LEV   Technical aspects: This EEG study was done with scalp electrodes positioned according to the 10-20 International system of electrode placement. Electrical activity was acquired at a sampling rate of 500Hz  and reviewed with a high frequency filter of 70Hz  and a low frequency filter of 1Hz . EEG data were recorded continuously and digitally stored.    DESCRIPTION: No clear posterior dominant rhythm was seen. EEG showed continuous generalized, maximal left temporal region, 3-6hz  theta-delta slowing. Hyperventilation and photic stimulation were not performed.   ABNORMALITY - Continuous slow, generalized and maximal left anterior temporal   IMPRESSION: This study showed evidence of cortical dysfunction in left anterior temporal region. Additionally, there is evidence of mild to moderate diffuse encephalopathy, non specific to etiology. No seizures were seen throughout the recording. EEG appeared to be improving compared to previous study on 10/16/2019.   Priyanka O Yadav   [place] EKG SITE RITE  Result Date: 10/18/2019 If [facility] image not attached, placement could not be confirmed due to current cardiac rhythm.   Labs:  Basic Metabolic Panel: Recent Labs  Lab 11/13/19 1838  NA 142  K 4.0  CL 106  CO2 28  GLUCOSE 139*  BUN 26*  CREATININE 1.04  CALCIUM 9.8    CBC: Recent Labs  Lab 11/13/19 1838  WBC 11.1*  NEUTROABS 9.0*  HGB 10.8*  HCT 33.2*  MCV 108.9*  PLT 344    CBG: No results for input(s): GLUCAP in the last 168 hours.  Brief HPI:   Baraka Parmeter  is a 86 y.o. right-handed male with history of osteoporosis, gait disorder, hereditary spherocytosis, recent diagnosis of dementia who was admitted 10/14/2019 with intermittent confusion and progressive weakness x1 week.  History taken from wife due to cognition wife also reports 6 months history of problem memory and weight loss.  He was found to be febrile temperature 102.6 MRI of the brain showed left anterior medial temporal lobe thickening and T2 hyperintensity gyri with edema and small nonmasslike enhancement left anterior temporal lobe.  Findings consistent with herpes encephalitis.  Empiric intravenous acyclovir was initiated.  TSH 2.289 and levothyroxine increased to 125 mcg/day.  EEG showed a sharp wave and left anterior temporal lesion with mild to moderate diffuse encephalopathy.  He was loaded with Keppra but continued to have issues with confusion of bouts of lethargy.  Hospital course complicated by dysphagia due to tendency to hold orals and therefore was placed on dysphagia #1 thin liquid diet. LT EEG was negative for seizures but did show epileptogenicity.  Blood cultures x2 done with 1+ for staph felt to be contaminant.  LP was ordered for work-up showed elevated protein 139 and lymphocytosis with WBC is 84.  Culture currently was pending continue on IV acyclovir for encephalopathy due to HSV encephalitis.  Patient with resultant cognitive deficits unintelligible language difficulty following commands he was admitted for a comprehensive rehab program   Hospital Course: Bracy Mcclelland was admitted to rehab 10/18/2019 for inpatient therapies to consist of PT, ST and OT at least three hours five days a week. Past admission physiatrist, therapy team and rehab RN have worked together to provide customized collaborative inpatient rehab.  Patient with long hospital complicated course in regards to cognitive deficits secondary to herpes encephalitis history of dementia completed IV acyclovir 11/05/2019  no change per infectious disease.  Most recent MRI showed no acute intracranial abnormalities unchanged findings of chronic ischemic microangiopathy and generalized atrophy.  Palliative care was consulted to establish goals of care.  Subcutaneous Lovenox for DVT prophylaxis no bleeding episodes.  Pain managed with use of Lidoderm patch for shoulder pain.  Mood stabilization along with the assistance of neuropsychology initially maintained on Ritalin later discontinued 11/07/2019.  Close monitoring of sleep patterns.  He was provided with a telemetry sitter for safety.  Ongoing discussions held with family in regards to patient's hospital course.  Hypothyroidism with hormone supplement as directed.  He would continue Keppra 500 mg twice daily as indicated.  Patient did have severe kyphosis position with pillows at night to help aid with sleeping and comfort.  Noted dysphagia his diet had been advanced to a dysphagia #1 nectar thick liquid diet with assistance per speech therapy   Blood pressures were monitored on TID basis and controlled   He/ has made slow gains during rehab stay and is   He/ will continue to receive follow up therapies   after discharge  Rehab course: During patient's stay in rehab weekly team conferences were held to monitor patient's progress, set goals and discuss barriers to discharge. At admission, patient required min max assist sit to stand total assist sit to supine.  Minimal assist upper body bathing minimal assist upper body dressing max assist lower body bathing total assist lower body dressing  Physical exam.  Blood pressure 92/77 pulse 83 temperature 97.6 respirate 17 oxygen saturation 97% room air Constitutional.  Frail-appearing male HEENT Head.  Normocephalic and atraumatic Eyes.  Pupils round and reactive to light no discharge without nystagmus Neck.  Supple nontender no JVD without thyromegaly Respiratory effort normal no respiratory distress without  wheeze Cardiac regular rate rhythm without any extra sounds or murmur heard GI.  Soft nontender positive bowel sounds Neurological.  Oriented to person only he was able to recognize his wife motor strength 4 out of 5 grossly graded throughout.  He/  has had improvement in activity tolerance, balance, postural control as well as ability to compensate for deficits. He/ has had improvement in functional use RUE/LUE  and RLE/LLE as well as improvement in awareness.  Sessions focused on functional mobility.  Performed STS from recliner with contact-guard assist and rolling walker.  Ambulates nursing station 95 feet close supervision provided.  Performed ambulatory transfers to the couch contact-guard assist.  Required minimal assist to stand from the couch.  Occupational Therapy focusing on self-care tasks.  Completed sit to stand from [place] with supervision ambulates to the sink rolling walker contact-guard assist.  He would sit at the sink side help be shaved required some increased time to initiate.  Long discussions were held with family on discharge planning home versus skilled nursing facility with recommendations for skilled nursing facility in bed becoming available for 10/07/2019.       Disposition: Discharge to skilled nursing facility  Diet: Dysphagia #1 nectar thick liquids  Special Instructions: Continue to provide supervision for patient safety  Patient should have weekly chemistries completed to monitor electrolytes  Medications at discharge 1.  Tylenol as needed 2.  Folic acid 1 mg p.o. daily 3.  Keppra 500 mg p.o. twice daily 4.  Synthroid 125 mcg p.o. daily 5.  Lidoderm patch change as directed 6.  Melatonin 3 mg nightly 7.  Multivitamin daily  8.  Flonase 1 spray each nare daily  Contact information for after-discharge care     Destination     [facility] .   Service: Skilled Nursing Contact information: [place] [phone]                Signed: Cathlyn Parsons 11/16/2019, 10:48 AM Patient was seen, face-face, and physical exam performed by me on day of discharge, greater than 30 minutes of total time spent.. Please see progress note from day of discharge as well.  Delice Lesch, MD, ABPMR

## 2019-11-16 NOTE — Progress Notes (Signed)
Speech Language Pathology Daily Session Note  Patient Details  Name: Eric Larsen MRN: [id number] Date of Birth: 1930/11/30  Today's Date: 11/16/2019 SLP Individual Time: 0915-0930 SLP Individual Time Calculation (min): 15 min  Short Term Goals: Week 4: SLP Short Term Goal 1 (Week 4): STGs = LTGs d/t remaining ELOS  Skilled Therapeutic Interventions:  Extensive time spent with pt's daughter providing education on MBS, safest diet recommendation as well as possibility of water protocol. All questions were answered to her satisfaction.      Pain    Therapy/Group: Individual Therapy  [name] 11/16/2019, 4:58 PM

## 2019-11-16 NOTE — Progress Notes (Signed)
Modified Barium Swallow Progress Note  Patient Details  Name: Eric Larsen MRN: [id number] Date of Birth: [birth year]  Today's Date: 11/16/2019  Modified Barium Swallow completed.  Full report located under Chart Review in the Imaging Section.  Brief recommendations include the following:  Clinical Impression  Pt presents with improve oropharyngeal abilities from previous MBS on 10/20/19. Pt oral phase is much shorter with liquids and with puree items. He continues to have difficulty with bolus cohesion, anterior-posterior transfer of boluses (liquids and food) with premature spillage. With more advanced trials of dysphagia 2 items, pt has much longer oral phase than with liquids or puree. Pt demonstrates mild to moderate pharyngeal impairments. When consuming nectar thick liquids and thin liquids pt's swallow is initiation at the level of pyriform sinuses. He continues to vallecular  residue with all PO which has been a consistent feature throughout all of his studies. However, pt didn't aspirate any of the residue as he had previously done on other studies. Pt's delayed swallow initiation leads to penetration of thin liquids during the swallow with penetrates reaching his vocal cords and residue in the laryngeal vestibule. Pt was not able to follow any directions in effort to clear penetrates. Pt's safest diet would be dysphagia 1 with nectar thick liquids, medicine crushed in puree with water protocol provided in between meals. If pt tolerates at bedside with no change in vitals, it would be feasible to upgrade to thin liquids at bedside.   Swallow Evaluation Recommendations       SLP Diet Recommendations: Dysphagia 1 (Puree) solids;Nectar thick liquid   Liquid Administration via: Cup   Medication Administration: Crushed with puree   Supervision: Staff to assist with self feeding;Full supervision/cueing for compensatory strategies   Compensations: Minimize environmental distractions;Slow  rate;Small sips/bites   Postural Changes: Seated upright at 90 degrees   Oral Care Recommendations: Oral care BID   Other Recommendations: Order thickener from pharmacy;Prohibited food (jello, ice cream, thin soups);Remove water pitcher    Kaelie Henigan 11/16/2019,4:56 PM

## 2019-11-16 NOTE — Care Management (Signed)
Patient ID: Eric Larsen, male   DOB: 12/24/1930, 86 y.o.   MRN: [id number] Spoke with [place] regarding SNF offers and she noted the wife has accepted an offer. Contacted SNF admissions and discharge to SNF is set for 11/17/19. Pending paperwork and infectious disease test result.

## 2019-11-17 MED ORDER — POTASSIUM CHLORIDE 20 MEQ/15ML (10%) PO SOLN
10.0000 meq | Freq: Every day | ORAL | Status: DC
  Administered 2019-11-17: 12:00:00 10 meq via ORAL
  Filled 2019-11-17: qty 15

## 2019-11-17 MED ORDER — LEVETIRACETAM 100 MG/ML PO SOLN
500.0000 mg | Freq: Two times a day (BID) | ORAL | Status: DC
  Administered 2019-11-17: 12:00:00 500 mg via ORAL
  Filled 2019-11-17: qty 5

## 2019-11-17 NOTE — Progress Notes (Signed)
Occupational Therapy Discharge Summary  Patient Details  Name: Eric Larsen MRN: [id number] Date of Birth: Dec 18, 1930  Patient has met 37 of 14 long term goals due to improved activity tolerance, improved balance and improved awareness.  Patient to discharge at [facility] Assist level with bathing, UB dressing and transfers; Mod assist with LB dressing.  Patient's care partner unavailable to provide the necessary physical and cognitive assistance at discharge.  Patient requires min-mod cues for sequencing and initiation during familiar tasks, due to fluctuating levels of cognition - awareness, attention, and recall from day to day or even moment to moment.  Patient's wife and daughters have been present for family education and have completed some hands on. Patient's wife, who is primary caregiver, is unable to provide the necessary level of assist that pt will require at d/c.  Reasons goals not met: N/A  Recommendation:  Patient will benefit from ongoing skilled OT services in skilled nursing facility setting to continue to advance functional skills in the area of BADL and Reduce care partner burden.  Equipment: No equipment provided  Reasons for discharge: treatment goals met and discharge from hospital  Patient/family agrees with progress made and goals achieved: Yes  OT Discharge Precautions/Restrictions  Precautions Precautions: Fall Precaution Comments: slow processing/initiation/poor attention Restrictions Weight Bearing Restrictions: No Pain  Pt with no c/o pain ADL ADL Grooming: Supervision/safety, Setup Where Assessed-Grooming: Sitting at sink Upper Body Bathing: Minimal assistance Where Assessed-Upper Body Bathing: Edge of bed Lower Body Bathing: Minimal assistance Where Assessed-Lower Body Bathing: Edge of bed Upper Body Dressing: Supervision/safety, Setup Where Assessed-Upper Body Dressing: Edge of bed Lower Body Dressing: Moderate assistance Toileting:  Minimal assistance Toilet Transfer: Minimal assistance Toilet Transfer Equipment: Bedside commode Tub/Shower Transfer: Minimal assistance Vision Baseline Vision/History: Wears glasses Patient Visual Report: No change from baseline Cognition Overall Cognitive Status: Impaired/Different from baseline Arousal/Alertness: Awake/alert Orientation Level: Oriented to person;Disoriented to place;Disoriented to time;Disoriented to situation Attention: Sustained Sustained Attention: Impaired Memory: Impaired Awareness: Impaired Problem Solving: Impaired Safety/Judgment: Impaired Comments: Pt with poor safety awareness Sensation Sensation Light Touch: Appears Intact Proprioception: Appears Intact Additional Comments: slight difficulty with assessment due to dysarthria Coordination Gross Motor Movements are Fluid and Coordinated: No Fine Motor Movements are Fluid and Coordinated: No Coordination and Movement Description: grossly uncoordinated due to decreased motor planning/initation, decreased ability to follow commands, and generalized weakness/severe kyphosis Finger Nose Finger Test: mild dysmetria bilaterally   Zaylan Kissoon 11/17/2019, 12:12 PM

## 2019-11-17 NOTE — Progress Notes (Signed)
telesitter notified staff multiple times, that pt was trying to get out of bed. Pt appeared very restless and agitated. Pt is unsure where he is going but would like to get out of the room. Pt was placed in the wheelchair and brought to the nurses station. Pts daughter was called to help relax him. Daughters requested they both come sit with the pt to help keep in calm and less agitated.  [name] and Jonelle Sidle (daughters) both at beside with the pt at this time.

## 2019-11-17 NOTE — Progress Notes (Signed)
Palliative Medicine RN Note: Family called our main office yesterday to speak to Dr Hilma Favors, who is out of the office until the middle of next week, 11/22/2019. They reported they can wait until she comes back.  Marjie Skiff Janaisa Birkland, RN, BSN, [facility] Palliative Medicine Team 11/17/2019 10:55 AM Office [phone]

## 2019-11-17 NOTE — Progress Notes (Signed)
[facility] PHYSICAL MEDICINE & REHABILITATION PROGRESS NOTE  Subjective/Complaints: Patient seen sitting up in his chair this morning.  Both daughters at bedside.  Patient slept on/off overnight.  Daughters with question regarding process for discharge, showing with use of patient's activities yesterday.  Daughter states "there was a breakdown in communication" regarding patient's plan to discharge to SNF today that she was unaware of.  Wife is aware.  Discussed with therapies as well.  ROS: Unable to assess due to cognition  Objective: Vital Signs: Blood pressure 130/77, pulse 80, temperature 97.8 F (36.6 C), resp. rate 16, height 5\' 4"  (1.626 m), weight 50.8 kg, SpO2 100 %. DG Swallowing Func-Speech Pathology  Result Date: 11/16/2019 Objective Swallowing Evaluation: Type of Study: MBS-Modified Barium Swallow Study  Patient Details Name: Eric Larsen MRN: [id number] Date of Birth: 1931-07-22 Today's Date: 11/16/2019 Time: SLP Start Time (ACUTE ONLY): 1309 -SLP Stop Time (ACUTE ONLY): 1335 SLP Time Calculation (min) (ACUTE ONLY): 26 min Past Medical History: Past Medical History: Diagnosis Date . Hypothyroidism  . Osteoporosis  . Thyroid disease  Past Surgical History: Past Surgical History: Procedure Laterality Date . CHOLECYSTECTOMY   . HIP FRACTURE SURGERY   . INTRAMEDULLARY (IM) NAIL INTERTROCHANTERIC Right 08/13/2017  Procedure: INTRAMEDULLARY (IM) NAIL INTERTROCHANTRIC;  Surgeon: Earnestine Leys, MD;  Location: [facility] ORS;  Service: Orthopedics;  Laterality: Right; . SHOULDER SURGERY   HPI: Eric Larsen is an  86 y.o. yo male w/ PMH significant for hypothyroidism, hyperlipidemia, hereditary spherocytosis and recent diagnosis of dementia presenting with one week of progressively worsening confusion.  MRI brain on 2/28 reported: "Left temporal lobe is abnormal with increased signal on T2 and FLAIR and cortical thickening and medially and anteriorly. There is a small area of non masslike  enhancement in the left anterior temporal lobe. The patient was febrile on admission. 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 Infiltrating tumor also in the differential.  Subjective: Pt was awake but confused Assessment / Plan / Recommendation CHL IP CLINICAL IMPRESSIONS 11/16/2019 Clinical Impression Pt presents iwth improve orpharyngeal abilities from previous MBS on 10/20/19. Pt oral phase is much shorter with liquids and with puree items. He continues to have difficulty wtih bolus cohesion, anterior-posterior transfer of boluses (liquids and food) with premature spillage. With more advanced trials of dysphagia 2 items, pt has much longer oral phase than with liquids or puree. Pt demosntrates mild to moderate pharyngeal impairements. When consuming nectar thick liquids and thin liquids pt's swallow is initiation at the level of pyriform sinsues. He continues to vallecular  residue with all POwhich has been a consistent feature throughout all of his studies. However, pt didn't aspirate any of the residue as he had previously done on other studies. Pt's delayed swallow intitiation leads to penetration of thin liquids during the swallow with penetrates reaching his vocal cords and residue in the larygenal vestibule. Pt was not able to follow any directions in effort to clear penetrates. Pt's safest diet would be dysphagia 1 with necatr thick liquids, medicine crushed in puree with water protocol provided inbetween meals. If pt tolerates at bedside with no change in vitals, it would be feasible to upgrade to thin liquids at bedside. SLP Visit Diagnosis Dysphagia, oropharyngeal phase (R13.12) Attention and concentration deficit following -- Frontal lobe and executive function deficit following -- Impact on safety and function Moderate aspiration risk;Risk for inadequate nutrition/hydration   CHL IP TREATMENT RECOMMENDATION 11/16/2019 Treatment Recommendations Therapy as outlined in treatment plan below    Prognosis 10/15/2019  Prognosis for Safe Diet Advancement Fair Barriers to Reach Goals Cognitive deficits;Language deficits Barriers/Prognosis Comment -- CHL IP DIET RECOMMENDATION 11/16/2019 SLP Diet Recommendations Dysphagia 1 (Puree) solids;Nectar thick liquid Liquid Administration via Cup Medication Administration Crushed with puree Compensations Minimize environmental distractions;Slow rate;Small sips/bites Postural Changes Seated upright at 90 degrees   CHL IP OTHER RECOMMENDATIONS 11/16/2019 Recommended Consults -- Oral Care Recommendations Oral care BID Other Recommendations Order thickener from pharmacy;Prohibited food (jello, ice cream, thin soups);Remove water pitcher   CHL IP FOLLOW UP RECOMMENDATIONS 10/18/2019 Follow up Recommendations 24 hour supervision/assistance   CHL IP FREQUENCY AND DURATION 10/15/2019 Speech Therapy Frequency (ACUTE ONLY) min 2x/week Treatment Duration 2 weeks      CHL IP ORAL PHASE 11/16/2019 Oral Phase Impaired Oral - Pudding Teaspoon -- Oral - Pudding Cup -- Oral - Honey Teaspoon NT Oral - Honey Cup -- Oral - Nectar Teaspoon NT Oral - Nectar Cup Weak lingual manipulation;Premature spillage Oral - Nectar Straw -- Oral - Thin Teaspoon Decreased bolus cohesion;Premature spillage Oral - Thin Cup Decreased bolus cohesion;Premature spillage Oral - Thin Straw -- Oral - Puree NT Oral - Mech Soft -- Oral - Regular -- Oral - Multi-Consistency Impaired mastication;Weak lingual manipulation;Reduced posterior propulsion;Decreased bolus cohesion;Premature spillage;Delayed oral transit Oral - Pill -- Oral Phase - Comment --  CHL IP PHARYNGEAL PHASE 11/16/2019 Pharyngeal Phase Impaired Pharyngeal- Pudding Teaspoon -- Pharyngeal -- Pharyngeal- Pudding Cup -- Pharyngeal -- Pharyngeal- Honey Teaspoon NT Pharyngeal -- Pharyngeal- Honey Cup -- Pharyngeal -- Pharyngeal- Nectar Teaspoon NT Pharyngeal -- Pharyngeal- Nectar Cup Delayed swallow initiation-pyriform sinuses;Reduced pharyngeal peristalsis;Pharyngeal  residue - valleculae Pharyngeal -- Pharyngeal- Nectar Straw -- Pharyngeal -- Pharyngeal- Thin Teaspoon Delayed swallow initiation-pyriform sinuses;Penetration/Aspiration during swallow;Pharyngeal residue - valleculae Pharyngeal Material enters airway, remains ABOVE vocal cords and not ejected out Pharyngeal- Thin Cup Delayed swallow initiation-pyriform sinuses;Reduced pharyngeal peristalsis;Penetration/Aspiration during swallow;Pharyngeal residue - valleculae Pharyngeal Material enters airway, CONTACTS cords and not ejected out Pharyngeal- Thin Straw NT Pharyngeal -- Pharyngeal- Puree NT Pharyngeal -- Pharyngeal- Mechanical Soft -- Pharyngeal -- Pharyngeal- Regular -- Pharyngeal -- Pharyngeal- Multi-consistency Delayed swallow initiation-pyriform sinuses;Penetration/Aspiration during swallow Pharyngeal Material enters airway, remains ABOVE vocal cords and not ejected out Pharyngeal- Pill -- Pharyngeal -- Pharyngeal Comment --  CHL IP CERVICAL ESOPHAGEAL PHASE 11/16/2019 Cervical Esophageal Phase WFL Pudding Teaspoon -- Pudding Cup -- Honey Teaspoon -- Honey Cup -- Nectar Teaspoon -- Nectar Cup -- Nectar Straw -- Thin Teaspoon -- Thin Cup -- Thin Straw -- Puree -- Mechanical Soft -- Regular -- Multi-consistency -- Pill -- Cervical Esophageal Comment -- Eric Larsen 11/16/2019, 4:58 PM              No results for input(s): WBC, HGB, HCT, PLT in the last 72 hours. No results for input(s): NA, K, CL, CO2, GLUCOSE, BUN, CREATININE, CALCIUM in the last 72 hours.  Physical Exam: BP 130/77 (BP Location: Left Arm)   Pulse 80   Temp 97.8 F (36.6 C)   Resp 16   Ht 5\' 4"  (1.626 m)   Wt 50.8 kg   SpO2 100%   BMI 19.21 kg/m   Constitutional: No distress . Vital signs reviewed. HENT: Normocephalic.  Atraumatic. Eyes: EOMI. No discharge. Cardiovascular: No JVD. Respiratory: Normal effort.  No stridor. GI: Non-distended. Skin: Warm and dry.  Intact. Psych: Confused, some improvement Musc: No edema in  extremities.  No tenderness in extremities. Neuro: Alert Motor: Grossly 4+/5 in BUE and BLE, appears unchanged  Assessment/Plan: 1. 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  Physiatrist is providing close team supervision and 24 hour management of active medical problems listed below.  Physiatrist and rehab team continue to assess barriers to discharge/monitor patient progress toward functional and medical goals  Care Tool:  Bathing  Bathing activity did not occur: Refused Body parts bathed by patient: Right arm, Left arm, Chest, Abdomen, Front perineal area, Left upper leg, Right lower leg, Left lower leg, Buttocks, Right upper leg, Face   Body parts bathed by helper: Buttocks     Bathing assist Assist Level: Contact Guard/Touching assist     Upper Body Dressing/Undressing Upper body dressing   What is the patient wearing?: Pull over shirt    Upper body assist Assist Level: Supervision/Verbal cueing    Lower Body Dressing/Undressing Lower body dressing      What is the patient wearing?: Underwear/pull up, Pants     Lower body assist Assist for lower body dressing: Moderate Assistance - Patient 50 - 74%     Toileting Toileting    Toileting assist Assist for toileting: Minimal Assistance - Patient > 75%     Transfers Chair/bed transfer  Transfers assist     Chair/bed transfer assist level: Contact Guard/Touching assist     Locomotion Ambulation   Ambulation assist      Assist level: Minimal Assistance - Patient > 75% Assistive device: Walker-rolling Max distance: 134ft   Walk 10 feet activity   Assist     Assist level: Minimal Assistance - Patient > 75% Assistive device: Walker-rolling   Walk 50 feet activity   Assist Walk 50 feet with 2 turns activity did not occur: Safety/medical concerns(fatigued)  Assist level: Minimal  Assistance - Patient > 75% Assistive device: Walker-rolling    Walk 150 feet activity   Assist Walk 150 feet activity did not occur: Safety/medical concerns(fatigued)  Assist level: Minimal Assistance - Patient > 75% Assistive device: Walker-rolling    Walk 10 feet on uneven surface  activity   Assist Walk 10 feet on uneven surfaces activity did not occur: Safety/medical concerns(fatigued)   Assist level: Minimal Assistance - Patient > 75% Assistive device: Aeronautical engineer Will patient use wheelchair at discharge?: Yes Type of Wheelchair: Manual    Wheelchair assist level: Dependent - Patient 0% Max wheelchair distance: 187ft    Wheelchair 50 feet with 2 turns activity    Assist        Assist Level: Dependent - Patient 0%   Wheelchair 150 feet activity     Assist     Assist Level: Dependent - Patient 0%      Medical Problem List and Plan: 1.  Cognitive deficits with unintelligible language, weakness, secondary to herpes encephalitis with history of dementia.  DC today  Will see patient for hospital follow-up in 1 month post-discharge  Completed course of IV acyclovir on 3/21, no changed per ID.   Appreciate Palliative care recs  2.  Antithrombotics: -DVT/anticoagulation:  Pharmaceutical: Lovenox             -antiplatelet therapy: NA 3. Pain Management: PRN medications. Lidocaine patch ordered for left shoulder.    Appears controlled on 4/2 4. Mood: Team support  Methylphenidate 5 mg daily started on 3/20, d/ced on 3/23             -antipsychotic agents: NA 5. Neuropsych: This patient is not capable of making decisions on his  own behalf.  Underlying dementia  Telesitter for safety  Discussed with neuropsych.  Appreciate neuropsych meeting with family.  Appreciate neuropsych eval 6. Skin/Wound Care: Routine pressure relief measures.  7. Fluids/Electrolytes/Nutrition: Monitor I/O.  8. Hypothyroid: Continue Synthroid  117mcg.  TSH elevated on 3/24, likely stress related 9. Epileptogenic potential: Conitnue Keppra 500 mg bid for seizure  No seizures from admission -4/2 10. Hypokalemia:   Potassium 4.0 on 3/29, monitor as outpatient  Daily supplement initiated, decreased on 3/26 11. Macrocytic anemia:   Latest hemoglobin 10.8  Vitamin B12 within normal limits  Folate WNL  Hemoccult negative  Continue to monitor 12.  Hypoalbuminemia  Supplement initiated on 3/4 13.  Severe Oral Dysphagia and mild to moderate pharyngeal dysphagia:   D1 honey thick liquids, advanced to D1 nectars  Suplemental IV fluids DC'd on 3/23  BMP within acceptable range on 3/29, will need to monitor in outpatient setting 14. Lethargy/decreased attention: Medications reviewed and he is not taking any overly sedating medications.   Melatonin reordered on 3/22, increased on 3/23  Some improvement overall, patient with inconsistent sleeping at baseline during the night and during the day, stable 15. Severe kyphosis: position with pillows when sleeping and sitting in chair, placed nursing order.  16.  Labile blood pressure   Vitals:   11/16/19 2009 11/17/19 0425  BP: 130/83 130/77  Pulse: 83 80  Resp: 16 16  Temp: 98.2 F (36.8 C) 97.8 F (36.6 C)  SpO2: 98% 100%   Controlled on 4/2 17.  Sleep disturbance  See #14 18. Impaired cognition secondary to dementia and encephalitis:   Repeat EEG with some improvement  MRI on 3/20 stable  > 30 spent in discharge planning with counseling and coordination of care between therapist, social worker, daughters regarding questions of prognosis, environment, swallowing, progress, etc.  LOS: 30 days A FACE TO FACE EVALUATION WAS PERFORMED  Georgeanne Frankland Lorie Phenix 11/17/2019, 8:43 AM

## 2019-11-17 NOTE — Progress Notes (Signed)
Pt was assisted to the bathroom, ambulated 1 assist w/ RW. Pt was assisted back to bed. Both daughters at bedside. Pt was able to answer all questions appropriately when asked (name/ DOB/Location/ month). Pt resting at this time

## 2019-11-17 NOTE — Progress Notes (Signed)
Speech Language Pathology Discharge Summary  Patient Details  Name: Eric Larsen MRN: [id number] Date of Birth: 01/07/1931   Patient has met 7 of 7 long term goals.  Patient to discharge at overall Mod;Max level.  Reasons goals not met:   N/A  Clinical Impression/Discharge Summary:   Pt has made progress over the course of skilled ST as he has progressed from total Assistance to Moderate to Max assistance. Specifically, pt beneifts from cues to sustain attention to basic task, complete basic ADLs tasks, safety awareness and recall of basic information. Additionally, pt had recent MBS on 11/16/19 -   MBS Report (11/16/19) Pt presents with improved oropharyngeal abilities from previous MBS on 10/20/19. Pt's oral phase is much shorter with liquids and with puree items. He continues to have difficulty with bolus cohesion, anterior-posterior transfer of boluses (liquids and food) with premature spillage. With more advanced trials of dysphagia 2 items, pt has much longer oral phase than with liquids or puree. Pt demonstrates mild to moderate pharyngeal impairments. When consuming nectar thick liquids and thin liquids pt's swallow is initiated at the level of the pyriform sinuses. He continues to have vallecular residue with all POs which has been a consistent feature throughout all of his studies. However, pt didn't aspirate any of the residue as he had previously done on other studies. Pt's delayed swallow initiation leads to penetration of thin liquids during the swallow with penetrates reaching his vocal cords and residue remaining in the laryngeal vestibule. Pt was not able to follow any directions in effort to clear penetrates. Pt's safest diet would be dysphagia 1 with nectar thick liquids, medicine crushed in puree with water protocol provided in between meals. If pt tolerates at bedside with no change in vitals, it would be feasible to upgrade to thin liquids at bedside.  Care Partner:  Caregiver  Able to Provide Assistance: No  Type of Caregiver Assistance: Physical;Cognitive  Recommendation:  Skilled Nursing facility  Rationale for SLP Follow Up: Maximize functional communication;Maximize cognitive function and independence;Maximize swallowing safety;Reduce caregiver burden   Equipment:   thickener  Reasons for discharge: Treatment goals met   Patient/Family Agrees with Progress Made and Goals Achieved: Yes    Courtlynn Holloman 11/17/2019, 6:38 AM

## 2019-11-21 NOTE — Plan of Care (Signed)
Patient remains max assist with bladder management and is incontinent despite attempts with toileting every 3-4 hours. Patient requires max cueing for safety awareness.

## 2019-11-22 NOTE — Progress Notes (Deleted)
    April 7th, 2021 [facility] Palliative Care Consult Note Telephone: [phone]  Fax: [phone]  PATIENT NAME: Eric Larsen DOB: 1930-09-28 MRN: [id number]  [facility] [address]  PRIMARY CARE PROVIDER:   [name], [place] [id number] [place],  [place] [address]  REFERRING PROVIDER: Dr. Wenda Low  RESPONSIBLE PARTY:     ASSESSMENT / RECOMMENDATIONS:  1. Advance Care Planning: A. Directives: B. Goals of Care:  2. Symptom Management:  3. Cognitive / Functional status:  4. Family Supports:   5. Follow up Palliative Care Visit:  I spent 60 minutes providing this consultation  from 9:30am to 10:30am. More than 50% of the time in this consultation was spent coordinating communication.   HISTORY OF PRESENT ILLNESS:  Eric Larsen is an 86 y.o.  male with multiple medical problems including ***. Palliative Care was asked to help address goals of care.   CODE STATUS:   PPS: 0% HOSPICE ELIGIBILITY/DIAGNOSIS: TBD  PAST MEDICAL HISTORY:  Past Medical History:  Diagnosis Date  . Hypothyroidism   . Osteoporosis   . Thyroid disease     SOCIAL HX:  Social History   Tobacco Use  . Smoking status: Never Smoker  . Smokeless tobacco: Never Used  Substance Use Topics  . Alcohol use: Yes    Comment: one vodka tonic every evening    ALLERGIES: No Known Allergies   PERTINENT MEDICATIONS:  Outpatient Encounter Medications as of 11/23/2019  Medication Sig  . fluticasone (FLONASE) 50 MCG/ACT nasal spray Place 1 spray into both nostrils daily.  . folic acid (FOLVITE) 1 MG tablet Take 1 tablet (1 mg total) by mouth daily.  Marland Kitchen levETIRAcetam (KEPPRA) 500 MG tablet Take 1 tablet (500 mg total) by mouth 2 (two) times daily.  Marland Kitchen levothyroxine (SYNTHROID) 125 MCG tablet Take 1 tablet (125 mcg total) by mouth daily at 6 (six) AM.  . lidocaine (LIDODERM) 5 % Place 1 patch onto the skin daily. Remove & Discard patch within  12 hours or as directed by MD  . melatonin 3 MG TABS tablet Take 1 tablet (3 mg total) by mouth at bedtime.  . Multiple Vitamin (MULTIVITAMIN) capsule Take 1 capsule by mouth daily.   No facility-administered encounter medications on file as of 11/23/2019.    PHYSICAL EXAM:   General: NAD, frail appearing, thin Cardiovascular: regular rate and rhythm Pulmonary: clear ant fields Abdomen: soft, nontender, + bowel sounds GU: no suprapubic tenderness Extremities: no edema, no joint deformities Skin: no rashes Neurological: Weakness but otherwise nonfocal  Julianne Handler, NP

## 2019-11-23 ENCOUNTER — Encounter: Payer: Medicare Other | Admitting: Internal Medicine

## 2019-11-23 ENCOUNTER — Other Ambulatory Visit: Payer: Self-pay

## 2019-11-24 ENCOUNTER — Telehealth: Payer: Self-pay

## 2019-11-24 ENCOUNTER — Non-Acute Institutional Stay: Payer: Medicare Other | Admitting: Internal Medicine

## 2019-11-24 ENCOUNTER — Other Ambulatory Visit: Payer: Self-pay

## 2019-11-24 VITALS — Ht 60.0 in | Wt 98.0 lb

## 2019-11-24 DIAGNOSIS — Z7189 Other specified counseling: Secondary | ICD-10-CM

## 2019-11-24 DIAGNOSIS — Z515 Encounter for palliative care: Secondary | ICD-10-CM

## 2019-11-24 NOTE — Telephone Encounter (Signed)
Patients wife [name] called stating she needs to talk to [facility] or Dr. Posey Pronto. Patient recently discharged and is now at [facility] SNF. She has some questions. Could you speak with her in Dr. Serita Grit absence?

## 2019-11-24 NOTE — Progress Notes (Signed)
April 9th, 2021 [facility] Palliative Care Consult Note Telephone: [phone]  Fax: [phone]  PATIENT NAME: Eric Larsen DOB: [birth year] MRN: [id number]  [address], [place] [id number] [phone] [place] [id number] (admitted 11/17/2019)  PRIMARY CARE PROVIDER:  [name], [place] [id number] [place],  [place] [address]  REFERRING PROVIDER: Dr. Wenda Larsen [phone]  RESPONSIBLE PARTY: (spouse) Eric Larsen [id number], [phone]. (daughter) Eric Larsen [phone]. (step son) Eric Larsen [phone]   ASSESSMENT / RECOMMENDATIONS:  1. Advance Care Planning: A. Directives: DNR order on chart B. Goals of Care: will address when able to speak to family  2. 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 Patient was asleep at the time of my evaluation. Staff requested that I not wake him, as he had been awake and agitated for the prior 36 hours. Staff report he is constantly confused. He is oriented to self only. His speech is mostly garbled. He seems to recognize his family members.   He was started on Depakote 125mg  qhs; first dose last eve without appreciable effect in behaior. He can have Ativan 0.5mg  q 6h prn but staff report a paradoxical effect, with resultant increased agitated behavior. Staff report family wishes to avoid antipsychotics.   -Consider increase of Depakote dose, or trial Seroquel 25mg  at hs.  He tends to push a wheelchair about, which helps with balance. Staff report he gathers most of his belongings about in his pockets and in the wheelchair. He insists on wearing a pair of shoes that are large on his feet. This, in combination with his kyphosis, very unsteady gait and tendency to walk at a rapid speed places him at a high risk for falls. He falls about once to twice a shift. Luckily hasn't sustained any injuries. He is unable to  understand how to use a walker. PT is following. He is dependent for hygiene, toileting and dressing, but is resistant to care and gets upset if staff persist in trying to help. He is incontinent of urine and stool.   Patient able to feed himself utilizing utensils. Tolerates a pureed diet with nector thickened fluids. His current weight is 98 lbs. This was a loss of 28 lbs (22% of body weight) over the last month. At a height 5' his BMI is 19.3 kg/m2.  3. Family Supports: Married to Eric Larsen. Daughter Eric Larsen.   4. Follow up Palliative Care Visit: I called patient's spouse to update with today's visit. Eric Larsen was rushed at the time of the call, mentioning she had a lot of relatives visiting this weekend, then we became disconnected. I'll try to call her again this evening or Monday. Will address possible use of medications that may assist with behavior, or at least help his get his sleep / wake cycle back on track.   I spent 60 minutes providing this consultation from 9am to 10am. More than 50% of the time in this consultation was spent coordinating communication.   HISTORY OF PRESENT ILLNESS:  Eric Larsen is an 86 y.o. male with h/o osteoporosis, gait disorder, hereditary spherocytosis, recent diagnosis of dementia  3/3-11/17/19 In hospital rehab unit  2/27-10/17/2019 herpes encephalitis with associated encephalopathy/confusion/lethargy/dyspagia (pocketing orals) Palliative Care was asked to help address goals of care.   CODE STATUS: Order for DNR written by facility provider.  PPS: 40%  HOSPICE ELIGIBILITY/DIAGNOSIS: TBD  PAST MEDICAL HISTORY:  Past Medical History:  Diagnosis  Date  . Hypothyroidism   . Osteoporosis   . Thyroid disease     SOCIAL HX:  Social History   Tobacco Use  . Smoking status: Never Smoker  . Smokeless tobacco: Never Used  Substance Use Topics  . Alcohol use: Yes    Comment: one vodka tonic every evening    ALLERGIES: No Known Allergies   PERTINENT  MEDICATIONS:  Outpatient Encounter Medications as of 11/24/2019  Medication Sig  . acetaminophen (TYLENOL) 325 MG tablet Take 650 mg by mouth every 6 (six) hours as needed.  . divalproex (DEPAKOTE SPRINKLE) 125 MG capsule Take 125 mg by mouth at bedtime.  . fluticasone (FLONASE) 50 MCG/ACT nasal spray Place 1 spray into both nostrils daily.  Marland Kitchen levETIRAcetam (KEPPRA) 500 MG tablet Take 1 tablet (500 mg total) by mouth 2 (two) times daily.  Marland Kitchen lidocaine (LIDODERM) 5 % Place 1 patch onto the skin daily. Remove & Discard patch within 12 hours or as directed by MD  . LORazepam (ATIVAN) 0.5 MG tablet Take 0.5 mg by mouth every 6 (six) hours as needed for anxiety.  . melatonin 3 MG TABS tablet Take 1 tablet (3 mg total) by mouth at bedtime.  Marland Kitchen levothyroxine (SYNTHROID) 125 MCG tablet Take 1 tablet (125 mcg total) by mouth daily at 6 (six) AM.  . [DISCONTINUED] folic acid (FOLVITE) 1 MG tablet Take 1 tablet (1 mg total) by mouth daily.  . [DISCONTINUED] Multiple Vitamin (MULTIVITAMIN) capsule Take 1 capsule by mouth daily.   No facility-administered encounter medications on file as of 11/24/2019.    PHYSICAL EXAM:   Frail appearing, elderly male lying supine sleeping in bed.  PE deferred to avoid awaking patient. Extremities: no edema, no joint deformities  Eric Handler, NP

## 2019-11-24 NOTE — Telephone Encounter (Signed)
Return Mrs. Purpura call, she wanted to speak with Dr Posey Pronto regarding her husband Mr. Wanamaker. She states the hospital staff explained to her and his daughter that her husband  ( Mr. Noh) shouldn't be on a hypnotic. She believes the nursing home is giving him a hypnotic. She was instructed to call [facility] and ask to speak to the nursing supervisor, she verbalizes understanding. She wanted the list of medication Mr. Ivanov was receiving while on inpatient rehabilitation. She was given Ms. Dorien Chihuahua RN number to discuss the above.  She would like for Dr Posey Pronto to give her a call, she realizes Dr Posey Pronto is on vacation and will return next week,  This provider will route this message to Dr Posey Pronto , asking Dr Posey Pronto to  give Mrs. Apachito  a call next week. She verbalizes understanding.

## 2019-11-25 ENCOUNTER — Encounter: Payer: Self-pay | Admitting: Internal Medicine

## 2019-11-27 NOTE — Telephone Encounter (Signed)
Called patient's wife and her daughter-in-law back and spoke to them.  Patient's wife with questions regarding discharge medications and follow-up appointments.  Discussed all medications and discharge from [facility] inpatient rehab.  Patient's wife states they are looking for another facility to transfer the patient to.

## 2019-11-28 ENCOUNTER — Other Ambulatory Visit: Payer: Self-pay

## 2019-11-28 ENCOUNTER — Non-Acute Institutional Stay: Payer: Medicare Other | Admitting: Internal Medicine

## 2019-11-28 DIAGNOSIS — Z515 Encounter for palliative care: Secondary | ICD-10-CM

## 2019-11-28 DIAGNOSIS — Z7189 Other specified counseling: Secondary | ICD-10-CM

## 2019-11-28 NOTE — Progress Notes (Signed)
April 13th, 2021 [facility] Palliative Care Consult Note Telephone: [phone]  Fax: [phone]   PATIENT NAME: Adrianne Hulme DOB: 10-01-1930 MRN: [id number]  [address], [place] [id number] [place] [id number] (admitted 11/17/2019)   PRIMARY CARE PROVIDER:  [name], MD [id number] [place],  [place] [address]   REFERRING PROVIDER: Dr. Wenda Low [phone]   RESPONSIBLE PARTY: (spouse) Loralee Pacas [id number], [phone]. (daughter) Tiffany Rangler [phone]. (step son) Ralene Bathe [phone]    ASSESSMENT / RECOMMENDATIONS:  1. Advance Care Planning: A. Directives: DNR order on chart B. Goals of Care: Family looking for facility with memory care. Patient's daughter from his first marriage, Jonelle Sidle, will be flying in from [place] April 16th, staying till April 20th, to assist patient's spouse with looking at facilities. They have an appointment for [facility] on April 19th, and spouse has some friends that are currently at [employer] on [address]; they plans to explore these leads, as well as others.    2. 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 Patient was up and about today. He is alert and engaging. He was hanging out in the PT department to pass the time. He didn't remember exactly where he is or why but seemed to understand when prompted. Staff report he slept well the night before and is taking his meds as prescribed. The Depakote 125mg  and Melatonin 3mg  at hs seem to be working well for him. His spouse came in to visit today and they were very happy to see each other. Staff report he is ambulating by pushing the wheelchair about. They believe patient has not had any recent falls. He has been given a total of 2 of his 0.5mg  Ativan yesterday, none today, and none over the weekend.    Patient can feed himself utilizing utensils.  Tolerates a pureed diet with nectar thickened fluids. No new weights; last weight was 98 lbs, which was a loss of 28 lbs (22% of body weight) over the previous month. At a height 5' his BMI was 19.3 kg/m2.   3. Family Supports: Married to [place]. Two daughters and a son from his first marriage.     4. Follow up Palliative Care Visit: pnd discharge to facility.    I spent 60 minutes providing this consultation from 9am to 10am. More than 50% of the time in this consultation was spent coordinating communication.    HISTORY OF PRESENT ILLNESS:  Senovio Yara is an 86 y.o. male with h/o osteoporosis, gait disorder, hereditary spherocytosis, recent diagnosis of dementia  3/3-11/17/19 In hospital rehab unit  2/27-10/17/2019 herpes encephalitis with associated encephalopathy/confusion/lethargy/dyspagia (pocketing orals) This is a f/u visit from 11/24/2019.   CODE STATUS: Order for DNR written by facility provider.   PPS: 40%   HOSPICE ELIGIBILITY/DIAGNOSIS: TBD PAST MEDICAL HISTORY:  Past Medical History:  Diagnosis Date  . Hypothyroidism   . Osteoporosis   . Thyroid disease     SOCIAL HX:  Social History   Tobacco Use  . Smoking status: Never Smoker  . Smokeless tobacco: Never Used  Substance Use Topics  . Alcohol use: Yes    Comment: one vodka tonic every evening    ALLERGIES: No Known Allergies   PERTINENT MEDICATIONS:  Outpatient Encounter Medications as of 11/28/2019  Medication Sig  . acetaminophen (TYLENOL) 325 MG tablet Take 650 mg by mouth every 6 (six) hours as needed.  Marland Kitchen  divalproex (DEPAKOTE SPRINKLE) 125 MG capsule Take 125 mg by mouth at bedtime.  . fluticasone (FLONASE) 50 MCG/ACT nasal spray Place 1 spray into both nostrils daily.  Marland Kitchen levETIRAcetam (KEPPRA) 500 MG tablet Take 1 tablet (500 mg total) by mouth 2 (two) times daily.  Marland Kitchen levothyroxine (SYNTHROID) 125 MCG tablet Take 1 tablet (125 mcg total) by mouth daily at 6 (six) AM.  . lidocaine (LIDODERM) 5 % Place 1  patch onto the skin daily. Remove & Discard patch within 12 hours or as directed by MD  . LORazepam (ATIVAN) 0.5 MG tablet Take 0.5 mg by mouth every 6 (six) hours as needed for anxiety.  . melatonin 3 MG TABS tablet Take 1 tablet (3 mg total) by mouth at bedtime.   No facility-administered encounter medications on file as of 11/28/2019.    PHYSICAL EXAM:   General: NAD, frail appearing, thin. Sitting up in his wheelchair. Alert and pleasantly conversant. Spouse visiting. PE deferred to limit COVID exposure. Extremities: no edema, no joint deformities Skin: no rashes Neurological: Weakness but otherwise nonfocal  Julianne Handler, NP

## 2019-11-29 ENCOUNTER — Encounter: Payer: Self-pay | Admitting: Internal Medicine

## 2020-01-01 ENCOUNTER — Encounter: Payer: Self-pay | Admitting: Physical Medicine & Rehabilitation

## 2020-01-01 ENCOUNTER — Other Ambulatory Visit: Payer: Self-pay

## 2020-01-01 ENCOUNTER — Encounter: Payer: Medicare Other | Attending: Physical Medicine & Rehabilitation | Admitting: Physical Medicine & Rehabilitation

## 2020-01-01 VITALS — BP 153/89 | HR 70 | Temp 97.5°F | Ht 60.0 in | Wt 112.0 lb

## 2020-01-01 DIAGNOSIS — R269 Unspecified abnormalities of gait and mobility: Secondary | ICD-10-CM | POA: Diagnosis present

## 2020-01-01 DIAGNOSIS — B004 Herpesviral encephalitis: Secondary | ICD-10-CM

## 2020-01-01 DIAGNOSIS — F0391 Unspecified dementia with behavioral disturbance: Secondary | ICD-10-CM | POA: Diagnosis not present

## 2020-01-01 DIAGNOSIS — E876 Hypokalemia: Secondary | ICD-10-CM | POA: Insufficient documentation

## 2020-01-01 DIAGNOSIS — R1312 Dysphagia, oropharyngeal phase: Secondary | ICD-10-CM | POA: Diagnosis present

## 2020-01-01 DIAGNOSIS — R9401 Abnormal electroencephalogram [EEG]: Secondary | ICD-10-CM | POA: Diagnosis present

## 2020-01-01 NOTE — Progress Notes (Signed)
Subjective:    Patient ID: Eric Larsen, male    DOB: 1931/03/21, 86 y.o.   MRN: [id number]  HPI Right-handed male with history of osteoporosis, gait disorder, hereditary spherocytosis, recent diagnosis of dementia presents for hospital follow up for herpes encephalitis.   He was discharged to SNF.  Wife provides history. He is now at a different SNF, which wife believes is better. No seizures since discharge. He is not sure if lab work has been checked.  No issues with swallowing. He is not drinking thin liquids. He has not had a swallow test. Sleeping has improved. Cognition is improving, continues to have word finding difficulty.  Denies falls.   Therapies: 1-2/week  Pain Inventory Average Pain 5 Pain Right Now 2 My pain is other  In the last 24 hours, has pain interfered with the following? General activity 3 Relation with others 3 Enjoyment of life 3 What TIME of day is your pain at its worst? daytime Sleep (in general) Fair  Pain is worse with: other Pain improves with: medication Relief from Meds: 7  Mobility walk with assistance use a walker Do you have any goals in this area?  yes  Function retired I need assistance with the following:  bathing  Neuro/Psych bladder control problems weakness trouble walking confusion  Prior Studies hospital f/u  Physicians involved in your care hospital f/u   Family History  Problem Relation Age of Onset  . Hereditary spherocytosis Father   . Hereditary spherocytosis Sister    Social History   Socioeconomic History  . Marital status: Married    Spouse name: Not on file  . Number of children: Not on file  . Years of education: Not on file  . Highest education level: Not on file  Occupational History  . Not on file  Tobacco Use  . Smoking status: Never Smoker  . Smokeless tobacco: Never Used  Substance and Sexual Activity  . Alcohol use: Yes    Comment: one vodka tonic every evening  . Drug use: No   . Sexual activity: Not on file  Other Topics Concern  . Not on file  Social History Narrative  . Not on file   Social Determinants of Health   Financial Resource Strain:   . Difficulty of Paying Living Expenses:   Food Insecurity:   . Worried About Charity fundraiser in the Last Year:   . Arboriculturist in the Last Year:   Transportation Needs:   . Film/video editor (Medical):   Marland Kitchen Lack of Transportation (Non-Medical):   Physical Activity:   . Days of Exercise per Week:   . Minutes of Exercise per Session:   Stress:   . Feeling of Stress :   Social Connections:   . Frequency of Communication with Friends and Family:   . Frequency of Social Gatherings with Friends and Family:   . Attends Religious Services:   . Active Member of Clubs or Organizations:   . Attends Archivist Meetings:   Marland Kitchen Marital Status:    Past Surgical History:  Procedure Laterality Date  . CHOLECYSTECTOMY    . HIP FRACTURE SURGERY    . INTRAMEDULLARY (IM) NAIL INTERTROCHANTERIC Right 08/13/2017   Procedure: INTRAMEDULLARY (IM) NAIL INTERTROCHANTRIC;  Surgeon: Earnestine Leys, MD;  Location: [facility] ORS;  Service: Orthopedics;  Laterality: Right;  . SHOULDER SURGERY     Past Medical History:  Diagnosis Date  . Hypothyroidism   . Osteoporosis   .  Thyroid disease    BP (!) 153/89   Pulse 70   Temp (!) 97.5 F (36.4 C)   Ht 5' (1.524 m)   Wt 112 lb (50.8 kg)   SpO2 94%   BMI 21.87 kg/m   Opioid Risk Score:   Fall Risk Score:  `1  Depression screen PHQ 2/9  Depression screen PHQ 2/9 01/01/2020  Decreased Interest 0  Down, Depressed, Hopeless 0  PHQ - 2 Score 0  Altered sleeping 0  Tired, decreased energy 1  Change in appetite 1  Feeling bad or failure about yourself  0  Trouble concentrating 1  Moving slowly or fidgety/restless 1  Suicidal thoughts 0  PHQ-9 Score 4  Difficult doing work/chores Not difficult at all    Review of Systems  Constitutional: Negative.    HENT: Negative.   Eyes: Negative.   Respiratory: Negative.   Cardiovascular: Negative.   Gastrointestinal: Negative.   Endocrine: Negative.   Genitourinary: Positive for difficulty urinating.  Musculoskeletal: Positive for gait problem.  Skin: Negative.   Allergic/Immunologic: Negative.   Neurological: Positive for weakness.  Psychiatric/Behavioral: Positive for dysphoric mood.       Objective:   Physical Exam Constitutional: NAD. Vital signs reviewed. HENT: Normocephalic.  Atraumatic. Eyes: EOMI. No discharge. Cardiovascular: No JVD. Respiratory: Normal effort.  No stridor. GI: Non-distended. Skin: Warm and dry.  Intact. Psych: Slowed, but improving Musc: No edema in extremities.  No tenderness in extremities. Neuro: Alert x1 HOH Motor: Grossly 4+/5 in BUE and BLE, appears unchanged    Assessment & Plan:  Right-handed male with history of osteoporosis, gait disorder, hereditary spherocytosis, recent diagnosis of dementia presents for hospital follow up for herpes encephalitis.   1.  Cognitive deficits with unintelligible language, weakness, secondary to herpes encephalitis with history of dementia.             Cont therapies, follow up regarding frequency  No information received from SNF  2. Pain Management:   Left shoulder pain controlled  3. Hypokalemia:   ?Labs drawn at SNF, unclear if still on supplement  Recommend follow up labs with SNF or will order if cannot draw  4.  Oral Dysphagia and mild to moderate pharyngeal dysphagia:  Not follow up MBS per wife, but eating ?Regular thins  Recommend repeat MBS  Patient is gaining weight  5. Abnormal EEG  May consider weaning Keppra soon with latest EEG without seizure activity  6. Gait abnormality  Cont rollator for safety

## 2020-01-05 ENCOUNTER — Telehealth: Payer: Self-pay | Admitting: *Deleted

## 2020-01-05 NOTE — Telephone Encounter (Signed)
Mrs Eric Larsen called and says that Dr Posey Pronto wanted to know if [facility] can do blood work.  She reports that she can.  She has given [place] a phone for Eric Larsen [phone] to call and let her know what labs need to be drawn.

## 2020-01-08 NOTE — Telephone Encounter (Signed)
Called phone number.  No answer.  Left voicemail requesting callback.  If Ms. DeVaughn calls back, please let her know that he was discharged on potassium supplement thank I was inquiring if the patient was still taking it as well as requesting a BMP.  Thanks.

## 2020-01-10 ENCOUNTER — Telehealth: Payer: Self-pay | Admitting: *Deleted

## 2020-01-10 NOTE — Telephone Encounter (Signed)
Discussed with Varney Biles at [facility], labs drawn earlier today are being followed.  Thanks.

## 2020-01-10 NOTE — Telephone Encounter (Signed)
SNF called back about the VM you left for labs on Eric Larsen. Alda Berthold is asking for you to call her again @ [phone].

## 2020-02-12 ENCOUNTER — Other Ambulatory Visit: Payer: Self-pay

## 2020-02-12 ENCOUNTER — Encounter: Payer: Self-pay | Admitting: Physical Medicine & Rehabilitation

## 2020-02-12 ENCOUNTER — Encounter: Payer: Medicare Other | Attending: Physical Medicine & Rehabilitation | Admitting: Physical Medicine & Rehabilitation

## 2020-02-12 VITALS — BP 155/77 | HR 74 | Temp 98.7°F | Ht 60.0 in | Wt 111.8 lb

## 2020-02-12 DIAGNOSIS — F0391 Unspecified dementia with behavioral disturbance: Secondary | ICD-10-CM | POA: Insufficient documentation

## 2020-02-12 DIAGNOSIS — B004 Herpesviral encephalitis: Secondary | ICD-10-CM | POA: Diagnosis present

## 2020-02-12 DIAGNOSIS — R1312 Dysphagia, oropharyngeal phase: Secondary | ICD-10-CM | POA: Diagnosis present

## 2020-02-12 DIAGNOSIS — R269 Unspecified abnormalities of gait and mobility: Secondary | ICD-10-CM | POA: Diagnosis present

## 2020-02-12 DIAGNOSIS — E876 Hypokalemia: Secondary | ICD-10-CM | POA: Diagnosis not present

## 2020-02-12 DIAGNOSIS — R9401 Abnormal electroencephalogram [EEG]: Secondary | ICD-10-CM | POA: Diagnosis present

## 2020-02-12 MED ORDER — LEVETIRACETAM 500 MG PO TABS: 500.0000 mg | ORAL_TABLET | Freq: Every day | ORAL | Status: DC

## 2020-02-12 NOTE — Progress Notes (Signed)
Subjective:    Patient ID: Eric Larsen, male    DOB: October 21, 1930, 86 y.o.   MRN: [id number]  HPI Right-handed male with history of osteoporosis, gait disorder, hereditary spherocytosis, recent diagnosis of dementia presents for hospital follow up for herpes encephalitis.   Last clinic visit on 01/01/20.  Wife provides history. Since that time, he has not had any therapies. Shoulder pain is controlled.  Unsure if labs were drawn. He had a repeat MBS, now on regular textures thins. He is still on Keppra. Fall when he did not take rollator to restroom and tripped.   Pain Inventory Average Pain 0 Pain Right Now 0 My pain is na  In the last 24 hours, has pain interfered with the following? General activity 0 Relation with others 0 Enjoyment of life 0 What TIME of day is your pain at its worst? na Sleep (in general) Fair  Pain is worse with: na Pain improves with: na Relief from Meds: na  Mobility walk with assistance use a walker Do you have any goals in this area?  yes  Function retired  Neuro/Psych  bladder control problems weakness trouble walking confusion  Prior Studies Any changes since last visit?  no  Physicians involved in your care PCP   Family History  Problem Relation Age of Onset  . Hereditary spherocytosis Father   . Hereditary spherocytosis Sister    Social History   Socioeconomic History  . Marital status: Married    Spouse name: Not on file  . Number of children: Not on file  . Years of education: Not on file  . Highest education level: Not on file  Occupational History  . Not on file  Tobacco Use  . Smoking status: Never Smoker  . Smokeless tobacco: Never Used  Substance and Sexual Activity  . Alcohol use: Yes    Comment: one vodka tonic every evening  . Drug use: No  . Sexual activity: Not on file  Other Topics Concern  . Not on file  Social History Narrative  . Not on file   Social Determinants of Health   Financial  Resource Strain:   . Difficulty of Paying Living Expenses:   Food Insecurity:   . Worried About Charity fundraiser in the Last Year:   . Arboriculturist in the Last Year:   Transportation Needs:   . Film/video editor (Medical):   Marland Kitchen Lack of Transportation (Non-Medical):   Physical Activity:   . Days of Exercise per Week:   . Minutes of Exercise per Session:   Stress:   . Feeling of Stress :   Social Connections:   . Frequency of Communication with Friends and Family:   . Frequency of Social Gatherings with Friends and Family:   . Attends Religious Services:   . Active Member of Clubs or Organizations:   . Attends Archivist Meetings:   Marland Kitchen Marital Status:    Past Surgical History:  Procedure Laterality Date  . CHOLECYSTECTOMY    . HIP FRACTURE SURGERY    . INTRAMEDULLARY (IM) NAIL INTERTROCHANTERIC Right 08/13/2017   Procedure: INTRAMEDULLARY (IM) NAIL INTERTROCHANTRIC;  Surgeon: Earnestine Leys, MD;  Location: [facility] ORS;  Service: Orthopedics;  Laterality: Right;  . SHOULDER SURGERY     Past Medical History:  Diagnosis Date  . Hypothyroidism   . Osteoporosis   . Thyroid disease    BP (!) 155/77   Pulse 74   Temp 98.7 F (37.1 C)  Ht 5' (1.524 m)   Wt 111 lb 12.8 oz (50.7 kg)   SpO2 95%   BMI 21.83 kg/m   Opioid Risk Score:   Fall Risk Score:  `1  Depression screen PHQ 2/9  Depression screen [facility] 2/9 02/12/2020 01/01/2020  Decreased Interest 0 0  Down, Depressed, Hopeless 0 0  PHQ - 2 Score 0 0  Altered sleeping - 0  Tired, decreased energy - 1  Change in appetite - 1  Feeling bad or failure about yourself  - 0  Trouble concentrating - 1  Moving slowly or fidgety/restless - 1  Suicidal thoughts - 0  PHQ-9 Score - 4  Difficult doing work/chores - Not difficult at all    Review of Systems  Constitutional: Negative.   HENT: Negative.   Eyes: Negative.   Respiratory: Negative.   Cardiovascular: Negative.   Gastrointestinal: Negative.     Endocrine: Negative.   Genitourinary: Positive for difficulty urinating.  Musculoskeletal: Positive for gait problem.  Skin: Negative.   Allergic/Immunologic: Negative.   Neurological: Positive for weakness.  Psychiatric/Behavioral: Positive for dysphoric mood.       Objective:   Physical Exam Constitutional: NAD. Vital signs reviewed. HENT: Normocephalic.  Atraumatic. Eyes: EOMI. No discharge. Cardiovascular: No JVD. Respiratory: Normal effort.  No stridor. GI: Non-distended. Skin: Warm and dry.  Intact. Psych: Slowed, improving Musc: No edema in extremities.  No tenderness in extremities. Neuro: Alert x1 HOH Motor: Grossly 4+/5 in BUE and BLE, appears unchanged    Assessment & Plan:  Right-handed malewith history of osteoporosis, gait disorder, hereditary spherocytosis, recent diagnosis of dementia presents for hospital follow up for herpes encephalitis.   1. Cognitive deficits with unintelligible language, weakness, secondary to herpes encephalitis with history of dementia.  Continue therapies at SNF             No information received from SNF  Cont HEP  2. Pain Management:              Left shoulder pain controlled  3. Hypokalemia:              ?Labs drawn at SNF, unclear if still on supplement. No follow up received by wife.   4. Oral Dysphagia and mild to moderate pharyngeal dysphagia:             Now on regular thins with accommodations  5. Abnormal EEG             Will decrease Keppra to 500 daily.  Will cont to wean as tolerated  Most recent EEG without seizure activity  6. Gait abnormality             Continue rollator for safety, stressed safety [place]

## 2020-03-11 ENCOUNTER — Encounter: Payer: Medicare Other | Admitting: Physical Medicine & Rehabilitation

## 2020-03-14 ENCOUNTER — Encounter: Payer: Self-pay | Admitting: Physical Medicine & Rehabilitation

## 2020-03-14 ENCOUNTER — Other Ambulatory Visit: Payer: Self-pay

## 2020-03-14 ENCOUNTER — Encounter: Payer: Medicare Other | Attending: Physical Medicine & Rehabilitation | Admitting: Physical Medicine & Rehabilitation

## 2020-03-14 VITALS — Ht 60.0 in | Wt 111.8 lb

## 2020-03-14 DIAGNOSIS — R269 Unspecified abnormalities of gait and mobility: Secondary | ICD-10-CM

## 2020-03-14 DIAGNOSIS — F0391 Unspecified dementia with behavioral disturbance: Secondary | ICD-10-CM | POA: Diagnosis not present

## 2020-03-14 DIAGNOSIS — B004 Herpesviral encephalitis: Secondary | ICD-10-CM | POA: Diagnosis not present

## 2020-03-14 DIAGNOSIS — E876 Hypokalemia: Secondary | ICD-10-CM

## 2020-03-14 DIAGNOSIS — R1312 Dysphagia, oropharyngeal phase: Secondary | ICD-10-CM

## 2020-03-14 DIAGNOSIS — R9401 Abnormal electroencephalogram [EEG]: Secondary | ICD-10-CM

## 2020-03-14 NOTE — Progress Notes (Signed)
Subjective:    Patient ID: Eric Larsen, male    DOB: 1931-07-10, 86 y.o.   MRN: [id number]  TELEHEALTH NOTE  Due to national recommendations of social distancing due to [place] 19, an audio/video telehealth visit is felt to be most appropriate for this patient at this time.  See Chart message from today for the patient's consent to telehealth from [place].     I verified that I am speaking with the correct person using two identifiers.  Location of patient: Home Location of provider: Office Method of communication: MyChart Names of participants : Zorita Pang scheduling, Wenda Overland obtaining consent and vitals if available Established patient  HPI Right-handed male with history of osteoporosis, gait disorder, hereditary spherocytosis, recent diagnosis of dementia presents for hospital follow up for herpes encephalitis.   Last clinic visit on 02/12/20.  Wife provides history. Since last visit, pt is still in therapies. He ambulating some.  Left shoulder pain is controlled.  She is not aware if labs were ordered or drawn.  She states she is not aware if Keppra was decreased.  Denies seizures.  Denies falls. Denies swallowing difficulty.  Pain Inventory Average Pain 0 Pain Right Now 0 My pain is intermittent occasionally the left shoulder hurts him but it is seldom  In the last 24 hours, has pain interfered with the following? General activity 0 Relation with others 0 Enjoyment of life 0 What TIME of day is your pain at its worst? na Sleep (in general) Fair  Pain is worse with: na Pain improves with: na Relief from Meds: na  Mobility walk with assistance use a walker Do you have any goals in this area?  yes  Function retired I need assistance with the following:  bathing, meal prep, household duties and shopping  Neuro/Psych  bladder control problems weakness trouble walking confusion  Prior Studies Any changes since  last visit?  no  Physicians involved in your care PCP   Family History  Problem Relation Age of Onset  . Hereditary spherocytosis Father   . Hereditary spherocytosis Sister    Social History   Socioeconomic History  . Marital status: Married    Spouse name: Not on file  . Number of children: Not on file  . Years of education: Not on file  . Highest education level: Not on file  Occupational History  . Not on file  Tobacco Use  . Smoking status: Never Smoker  . Smokeless tobacco: Never Used  Substance and Sexual Activity  . Alcohol use: Yes    Comment: one vodka tonic every evening  . Drug use: No  . Sexual activity: Not on file  Other Topics Concern  . Not on file  Social History Narrative  . Not on file   Social Determinants of Health   Financial Resource Strain:   . Difficulty of Paying Living Expenses:   Food Insecurity:   . Worried About Charity fundraiser in the Last Year:   . Arboriculturist in the Last Year:   Transportation Needs:   . Film/video editor (Medical):   Marland Kitchen Lack of Transportation (Non-Medical):   Physical Activity:   . Days of Exercise per Week:   . Minutes of Exercise per Session:   Stress:   . Feeling of Stress :   Social Connections:   . Frequency of Communication with Friends and Family:   . Frequency of Social Gatherings with Friends and Family:   .  Attends Religious Services:   . Active Member of Clubs or Organizations:   . Attends Archivist Meetings:   Marland Kitchen Marital Status:    Past Surgical History:  Procedure Laterality Date  . CHOLECYSTECTOMY    . HIP FRACTURE SURGERY    . INTRAMEDULLARY (IM) NAIL INTERTROCHANTERIC Right 08/13/2017   Procedure: INTRAMEDULLARY (IM) NAIL INTERTROCHANTRIC;  Surgeon: Earnestine Leys, MD;  Location: [facility] ORS;  Service: Orthopedics;  Laterality: Right;  . SHOULDER SURGERY     Past Medical History:  Diagnosis Date  . Hypothyroidism   . Osteoporosis   . Thyroid disease    There were  no vitals taken for this visit.  Opioid Risk Score:   Fall Risk Score:  `1  Depression screen PHQ 2/9  Depression screen [facility] 2/9 02/12/2020 01/01/2020  Decreased Interest 0 0  Down, Depressed, Hopeless 0 0  PHQ - 2 Score 0 0  Altered sleeping - 0  Tired, decreased energy - 1  Change in appetite - 1  Feeling bad or failure about yourself  - 0  Trouble concentrating - 1  Moving slowly or fidgety/restless - 1  Suicidal thoughts - 0  PHQ-9 Score - 4  Difficult doing work/chores - Not difficult at all    Review of Systems  Constitutional: Negative.   HENT: Negative.   Eyes: Negative.   Respiratory: Negative.   Cardiovascular: Negative.   Gastrointestinal: Negative.   Endocrine: Negative.   Genitourinary: Positive for difficulty urinating.       "goes through a lot of depends"  Musculoskeletal: Positive for gait problem.  Skin: Negative.   Allergic/Immunologic: Negative.   Neurological: Positive for weakness.  Hematological: Negative.   Psychiatric/Behavioral: Positive for confusion.  All other systems reviewed and are negative.     Objective:   Physical Exam Constitutional: NAD. Vital signs reviewed. HENT: Normocephalic.  Atraumatic. Eyes: EOMI. No discharge. Cardiovascular: No JVD. Respiratory: Normal effort.  No stridor. GI: Non-distended. Skin: Warm and dry.  Intact. Psych: Slowed, improving Musc: No edema in extremities.  No tenderness in extremities. Neuro: Alert x1 HOH    Assessment & Plan:  Right-handed malewith history of osteoporosis, gait disorder, hereditary spherocytosis, recent diagnosis of dementia presents for hospital follow up for herpes encephalitis.   1. Cognitive deficits with unintelligible language, weakness, secondary to herpes encephalitis with history of dementia.  Continue therapies at SNF             Cont exercises   2. Pain Management:              Left shoulder pain controlled  3. Hypokalemia:              ?Labs drawn at  SNF, unclear if still on supplement. No follow up received by wife, continues to await.  Wife to request records in writing.  4. Oral Dysphagia and mild to moderate pharyngeal dysphagia:             Now on regular thins with accomodations  5. Abnormal EEG             Decreased Keppra to 500 daily on last visit ?follow through.  Will cont to wean as tolerated  Most recent EEG without seizure activity  6. Gait abnormality   Continue Rolator for safety

## 2020-03-28 ENCOUNTER — Other Ambulatory Visit: Payer: Self-pay

## 2020-03-28 ENCOUNTER — Non-Acute Institutional Stay: Payer: Medicare Other | Admitting: Internal Medicine

## 2020-03-28 DIAGNOSIS — Z7189 Other specified counseling: Secondary | ICD-10-CM

## 2020-03-28 DIAGNOSIS — Z515 Encounter for palliative care: Secondary | ICD-10-CM

## 2020-03-30 ENCOUNTER — Other Ambulatory Visit: Payer: Self-pay

## 2020-03-30 ENCOUNTER — Emergency Department (HOSPITAL_COMMUNITY)
Admission: EM | Admit: 2020-03-30 | Discharge: 2020-03-30 | Disposition: A | Discharge status: Home / Self Care | Payer: Medicare Other | Attending: Emergency Medicine | Admitting: Emergency Medicine

## 2020-03-30 ENCOUNTER — Encounter (HOSPITAL_COMMUNITY): Payer: Self-pay

## 2020-03-30 ENCOUNTER — Emergency Department (HOSPITAL_COMMUNITY): Payer: Medicare Other

## 2020-03-30 DIAGNOSIS — Z79890 Hormone replacement therapy (postmenopausal): Secondary | ICD-10-CM | POA: Diagnosis not present

## 2020-03-30 DIAGNOSIS — Y9301 Activity, walking, marching and hiking: Secondary | ICD-10-CM | POA: Diagnosis not present

## 2020-03-30 DIAGNOSIS — W19XXXA Unspecified fall, initial encounter: Secondary | ICD-10-CM | POA: Diagnosis not present

## 2020-03-30 DIAGNOSIS — S299XXA Unspecified injury of thorax, initial encounter: Secondary | ICD-10-CM | POA: Diagnosis present

## 2020-03-30 DIAGNOSIS — S0081XA Abrasion of other part of head, initial encounter: Secondary | ICD-10-CM | POA: Insufficient documentation

## 2020-03-30 DIAGNOSIS — F0390 Unspecified dementia without behavioral disturbance: Secondary | ICD-10-CM | POA: Diagnosis not present

## 2020-03-30 DIAGNOSIS — S22079A Unspecified fracture of T9-T10 vertebra, initial encounter for closed fracture: Secondary | ICD-10-CM | POA: Diagnosis not present

## 2020-03-30 DIAGNOSIS — Y929 Unspecified place or not applicable: Secondary | ICD-10-CM | POA: Diagnosis not present

## 2020-03-30 DIAGNOSIS — E039 Hypothyroidism, unspecified: Secondary | ICD-10-CM | POA: Diagnosis not present

## 2020-03-30 DIAGNOSIS — S22019A Unspecified fracture of first thoracic vertebra, initial encounter for closed fracture: Secondary | ICD-10-CM

## 2020-03-30 DIAGNOSIS — Y999 Unspecified external cause status: Secondary | ICD-10-CM | POA: Insufficient documentation

## 2020-03-30 NOTE — ED Provider Notes (Signed)
  Face-to-face evaluation   History: He presents for evaluation of fall, while he was at his nursing home.  This was essentially witnessed by his wife was in the same room, hard and falling turned around and saw him on the floor.  He injured the left side of his head.  His behavior has been normal since.  He has dementia and is in a memory care unit.  Physical exam: Alert elderly man who is conversant and comfortable.  Small contusion, left forehead.  Cervical collar removed by me exhibits good range of motion of the neck.  There is no palpable tenderness posterior cervical or upper thoracic spine regions.  Normal range of motion arms and legs bilaterally.  I discussed the findings with the patient and he has a wife.  I have reviewed his imaging, which appears to show an acute on chronic compression fracture T1.  Patient is neurologically intact, and does not appear to be at risk for spinal compromise.  He is stable for discharge with outpatient management and orthopedic follow-up, if needed.  He may be a candidate for kyphoplasty if he has persistent pain.  Medical screening examination/treatment/procedure(s) were conducted as a shared visit with non-physician practitioner(s) and myself.  I personally evaluated the patient during the encounter    Daleen Bo, MD 03/31/20 [phone]

## 2020-03-30 NOTE — ED Provider Notes (Signed)
[facility] DEPT Provider Note   CSN: [id number] Arrival date & time: 03/30/20  1702     History Chief Complaint  Patient presents with  . Fall    Eric Larsen is a 86 y.o. male who presents to the ER after fall. Patient was walking around his bedpost and fell. He hit his R head. He Had LOC  For about 1 minute and was " a bit out of it" for about 45 seconds. Hx of Dementia, encephalitis/ seizures and osteoporosis.Hx given by patient's wife at bedside.  HPI     Past Medical History:  Diagnosis Date  . Hypothyroidism   . Osteoporosis   . Thyroid disease     Patient Active Problem List   Diagnosis Date Noted  . Abnormality of gait 01/01/2020  . Hypokalemia   . Sleep disturbance   . Dementia associated with other underlying disease without behavioral disturbance ([place])   . Palliative care encounter   . Lethargy   . Acute blood loss anemia   . Hypoalbuminemia due to protein-calorie malnutrition ([place])   . Microcytic anemia   . Dysphagia   . Encephalitis 10/18/2019  . Abnormal EEG   . Hypothyroidism   . Macrocytic anemia   . 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 Hip fracture ([place]) 08/12/2017  . Hallux valgus of left foot 04/28/2017  . Hammer toe of left foot 04/28/2017  . Corn of toe 04/28/2017  . History of total left hip replacement 09/19/2015  . Spherocytosis (familial) ([place]) 09/17/2015  . Mixed hyperlipidemia 06/19/2015  . Benign prostatic hyperplasia with lower urinary tract symptoms 06/19/2015  . History of fracture of vertebra 11/06/2014  . History of colon polyps 08/08/2014  . Pathological fracture 11/01/2013  . Toenail fungus 01/20/2013  . Vitamin D deficiency 09/27/2012  . Primary hypothyroidism 09/27/2012  . Osteoporosis 09/27/2012  . Spherocytosis ([place]) 12/04/2011  . Skin cancer 12/04/2011    Past Surgical History:  Procedure Laterality Date  . CHOLECYSTECTOMY    . HIP FRACTURE  SURGERY    . INTRAMEDULLARY (IM) NAIL INTERTROCHANTERIC Right 08/13/2017   Procedure: INTRAMEDULLARY (IM) NAIL INTERTROCHANTRIC;  Surgeon: Earnestine Leys, MD;  Location: [facility] ORS;  Service: Orthopedics;  Laterality: Right;  . SHOULDER SURGERY         Family History  Problem Relation Age of Onset  . Hereditary spherocytosis Father   . Hereditary spherocytosis Sister     Social History   Tobacco Use  . Smoking status: Never Smoker  . Smokeless tobacco: Never Used  Substance Use Topics  . Alcohol use: Yes    Comment: one vodka tonic every evening  . Drug use: No    Home Medications Prior to Admission medications   Medication Sig Start Date End Date Taking? Authorizing Provider  acetaminophen (TYLENOL) 325 MG tablet Take 325 mg by mouth every 6 (six) hours as needed for mild pain, moderate pain or fever.    Yes [provider]  divalproex (DEPAKOTE SPRINKLE) 125 MG capsule Take 125 mg by mouth 3 (three) times daily.    Yes [provider]  folic acid (FOLVITE) 1 MG tablet Take 1 mg by mouth daily.   Yes [provider]  levETIRAcetam (KEPPRA) 100 MG/ML solution Take 500 mg by mouth 2 (two) times daily.   Yes [provider]  levothyroxine (SYNTHROID) 125 MCG tablet Take 1 tablet (125 mcg total) by mouth daily at 6 (six) AM. 11/17/19  Yes Angiulli, Quillian Quince  J, PA-C  lidocaine (LIDODERM) 5 % Place 1 patch onto the skin daily. Remove & Discard patch within 12 hours or as directed by MD Patient taking differently: Place 1 patch onto the skin daily as needed (pain). Remove & Discard patch after 12 hours 11/16/19  Yes Angiulli, Lavon Paganini, PA-C  LORazepam (ATIVAN) 0.5 MG tablet Take 0.25 mg by mouth every 8 (eight) hours as needed for anxiety (agitation).    Yes [provider]  melatonin 3 MG TABS tablet Take 1 tablet (3 mg total) by mouth at bedtime. 11/16/19  Yes Angiulli, Lavon Paganini, PA-C  OVER THE COUNTER MEDICATION Take 1 packet by mouth daily at 2  PM. Mix 1 packet in 8oz of water and drink. LIFE PAK ANTI-AGING SUPPLEMENT   Yes [provider]  fluticasone (FLONASE) 50 MCG/ACT nasal spray Place 1 spray into both nostrils daily. Patient not taking: Reported on 03/30/2020 11/17/19   Angiulli, Lavon Paganini, PA-C  levETIRAcetam (KEPPRA) 500 MG tablet Take 1 tablet (500 mg total) by mouth daily. Patient not taking: Reported on 03/30/2020 02/12/20   Jamse Arn, MD    Allergies    Pollen extract  Review of Systems   Review of Systems  Unable to perform ROS: Dementia     Physical Exam Updated Vital Signs BP (!) 136/99 (BP Location: Right Arm)   Pulse 84   Temp 98.7 F (37.1 C) (Oral)   Resp 19   Ht 5\' 8"  (1.727 m)   Wt 52 kg   SpO2 (!) 89%   BMI 17.43 kg/m   Physical Exam Vitals and nursing note reviewed.  Constitutional:      General: He is not in acute distress.    Appearance: He is well-developed. He is not diaphoretic.  HENT:     Head: Normocephalic and atraumatic.     Comments: Abrasion to R temple    Nose: Nose normal.  Eyes:     General: No scleral icterus.    Extraocular Movements: Extraocular movements intact.     Conjunctiva/sclera: Conjunctivae normal.     Pupils: Pupils are equal, round, and reactive to light.  Neck:     Comments: C collar in place Cardiovascular:     Rate and Rhythm: Normal rate and regular rhythm.     Heart sounds: Normal heart sounds.  Pulmonary:     Effort: Pulmonary effort is normal. No respiratory distress.     Breath sounds: Normal breath sounds.  Abdominal:     Palpations: Abdomen is soft.     Tenderness: There is no abdominal tenderness.  Skin:    General: Skin is warm and dry.  Neurological:     Mental Status: He is alert.  Psychiatric:        Behavior: Behavior normal.     ED Results / Procedures / Treatments   Labs (all labs ordered are listed, but only abnormal results are displayed) Labs Reviewed - No data to display  EKG None  Radiology No results  found.  Procedures Procedures (including critical care time)  Medications Ordered in ED Medications - No data to display  ED Course  I have reviewed the triage vital signs and the nursing notes.  Pertinent labs & imaging results that were available during my care of the patient were reviewed by me and considered in my medical decision making (see chart for details).    MDM Rules/Calculators/A&P  86 year old male here with mechanical fall.  I ordered interpreted and reviewed CT head, CT C-spine and 2 view chest x-ray.  Chest x-ray shows chronic eighth ninth and 10th rib fractures.  CT head without evidence of acute abnormality.  CT C-spine with chronic T1 fracture which may be slightly worse at this time.  Patient has no significant pain or weakness on examination.  Seen and shared visit with Dr. Eulis Foster.  Patient appears otherwise appropriate for discharge at this time will follow up in the outpatient setting with neurosurgery.  Final Clinical Impression(s) / ED Diagnoses Final diagnoses:  Closed fracture of first thoracic vertebra, unspecified fracture morphology, initial encounter [facility])    Rx / [place] Orders ED Discharge Orders    None       Margarita Mail, PA-C 03/30/20 2350    Daleen Bo, MD 03/31/20 [phone]

## 2020-03-30 NOTE — Discharge Instructions (Signed)
Contact a health care provider if: You have a fever. You develop a cough that makes your pain worse. Your pain medicine is not helping. Your pain does not get better over time. You cannot return to your normal activities as planned or expected. Get help right away if: Your pain is very bad and it suddenly gets worse. You are unable to move any part of your body (paralysis) that is below the level of your injury. You have numbness, tingling, or weakness in any part of your body that is below the level of your injury. You cannot control your bladder or bowels.

## 2020-03-30 NOTE — ED Notes (Addendum)
Pt BIB EMS. Pt had a witnessed fall; wife stated "I think he passed out for a few seconds because he was not responding." Pt complaining of right elbow pain; pt does have old bruises on right elbow from previous fall. Per EMS pt has right sided head swelling. No blood thinners. Hx of dementia. Pt lives in nursing home. Pt has c collar on.   BP-129/77 Temp-98.3 HR-100 O2-95% RA CBG-121

## 2020-03-31 ENCOUNTER — Encounter: Payer: Self-pay | Admitting: Internal Medicine

## 2020-03-31 NOTE — Progress Notes (Signed)
Aug 12th, 2021 [facility] Palliative Care Consult Note Telephone: [phone]  Fax: [phone]   PATIENT NAME: Eric Larsen DOB: [birth year] MRN: [id number]  [address], [place] [address] 381 [phone] [place] [address] (admitted 11/17/2019)   PRIMARY CARE PROVIDER:  [name], MD [address] [place],  [place] [address]  REFERRING PROVIDER: Betti Larsen   RESPONSIBLE PARTY: (spouse) Eric Larsen [phone], [phone]. (daughter) Eric Larsen [phone]. (step son) Eric Larsen [phone]    ASSESSMENT / RECOMMENDATIONS:  1. Advance Care Planning: A. Directives: DNR. B. Goals of Care: Comfort and loving care. I had received a TC from daughter Eric Larsen (who lives in [place] and has not seen her dad for about 4 months) a few days ago, requesting I drop by and assess her dad. Patients son, who lives here in [place] and had recently visited, and was alarmed by degree of cognitive and physical decline since he had last seen his dad some months earlier. Since my last visit, patient has been transferred from [facility] to [facility].     2. Cognitive / Functional status: Dementia FAST 6c.  Since last seen 4 months earlier, patient appears more anxious and restless. He is oriented to himself only. He answers my questions in complete sentences, though not certain of how to answer my questions. He was able to talk on his smart phone but needed assist with answering that device. Recognizes his spouse Eric Larsen, who visits him on a daily basis and stays for hours at a time. Staff report that she assists patient in his ADLs. Patient was able to remember Eric Larsen name, but not the names of his childrens. Eric Larsen recently changed her visiting time to q afternoon instead of q am, and staff feel this upset patients routine. Current regimen is Ativan 0.25mg  (1/2 of a 5mg  tab) q8h prn. Staff are administering qd to  qod, mostly in the mornings. He is on Keppra for h/o seizure activity. Continues on Depakote 125mg  tid for behaviors, Melatonin 3mg  for sleep.  Patient was able to engage in conversation with me. Able to follow simple commands but was resistant to direction. Staff report they need to frequently redirect him but it doesnt stick. Staff report he has no behavior management issues, but that he constantly roams about facility halls with a somewhat unsteady gait. He was wheelchair bound 4 months earlier but is now able to use his rollator walker. Seems high risk for falls. He fell a few months ago and sustained a mild scalp abrasion. He is independent in toileting but staff watch out for when he goes to provide stand by assist. He is a light assist (verbal and physical cuing) for dressing and bathing. He feeds himself.  His current weight is 112.9 lbs, which is a gain of almost 15lbs from 4 months earlier. At a height of 5 his BMI is 22 kg/m2. He consumes 50 to mostly 100% of his meals.   -Recommend initiation of Namenda for anxiousness and memory. Consider slow taper to d/c use of prn Ativan (decrease risk of falls, and possible contribution to confusion).  3. Family Supports: Eric Larsen. Two daughters and a son from his first marriage.     4. Follow up Palliative Care Visit: in 2-3 months or prn.    I spent 60 minutes providing this consultation from 9am to 10am. More than 50% of the time in this consultation was spent coordinating communication.  HISTORY OF PRESENT ILLNESS:  Eric Larsen is an 86 y.o. male with h/o osteoporosis, gait disorder, hereditary spherocytosis, recent diagnosis of dementia  -3/3-11/17/19 In hospital rehab unit  -2/27-10/17/2019 herpes encephalitis with associated encephalopathy/confusion/lethargy/dyspagia (pocketing orals)  This is a f/u visit from 11/28/2019.   CODE STATUS: Order for DNR written by facility provider.   PPS: 40%   HOSPICE  ELIGIBILITY/DIAGNOSIS: TBD  PAST MEDICAL HISTORY:  Past Medical History:  Diagnosis Date   Hypothyroidism    Osteoporosis    Thyroid disease     SOCIAL HX:  Social History   Tobacco Use   Smoking status: Never Smoker   Smokeless tobacco: Never Used  Substance Use Topics   Alcohol use: Yes    Comment: one vodka tonic every evening    ALLERGIES:  Allergies  Allergen Reactions   Pollen Extract Other (See Comments)    On MAR     PERTINENT MEDICATIONS:  Outpatient Encounter Medications as of 03/28/2020  Medication Sig   acetaminophen (TYLENOL) 325 MG tablet Take 325 mg by mouth every 6 (six) hours as needed for mild pain, moderate pain or fever.    divalproex (DEPAKOTE SPRINKLE) 125 MG capsule Take 125 mg by mouth 3 (three) times daily.    folic acid (FOLVITE) 1 MG tablet Take 1 mg by mouth daily.   levETIRAcetam (KEPPRA) 100 MG/ML solution Take 500 mg by mouth 2 (two) times daily.   levothyroxine (SYNTHROID) 125 MCG tablet Take 1 tablet (125 mcg total) by mouth daily at 6 (six) AM.   lidocaine (LIDODERM) 5 % Place 1 patch onto the skin daily. Remove & Discard patch within 12 hours or as directed by MD (Patient taking differently: Place 1 patch onto the skin daily as needed (pain). Remove & Discard patch after 12 hours)   LORazepam (ATIVAN) 0.5 MG tablet Take 0.25 mg by mouth every 8 (eight) hours as needed for anxiety (agitation).    melatonin 3 MG TABS tablet Take 1 tablet (3 mg total) by mouth at bedtime.   OVER THE COUNTER MEDICATION Take 1 packet by mouth daily at 2 PM. Mix 1 packet in 8oz of water and drink. LIFE PAK ANTI-AGING SUPPLEMENT   fluticasone (FLONASE) 50 MCG/ACT nasal spray Place 1 spray into both nostrils daily. (Patient not taking: Reported on 03/30/2020)   [DISCONTINUED] levETIRAcetam (KEPPRA) 500 MG tablet Take 1 tablet (500 mg total) by mouth daily. (Patient not taking: Reported on 03/30/2020)   No facility-administered encounter medications on  file as of 03/28/2020.    PHYSICAL EXAM:   Sat RA 93%, 100/60, HR 96, BP 100/60, RR 16   General: NAD, frail appearing, thin. Seen ambulating up and about independently with rollator with fairly steady gait. Knows to sit on rollator when needs a seat. Alert and pleasantly conversant. Oriented to self only. Couldnt recall childrens names. Lungs: distant posterior breath sounds but CTA Extremities: no edema, no joint deformities Skin: exposed skin without rashes Neurological: Weakness but otherwise non-focal    Julianne Handler, NP [place]

## 2020-05-23 ENCOUNTER — Emergency Department (HOSPITAL_COMMUNITY): Payer: Medicare Other

## 2020-05-23 ENCOUNTER — Inpatient Hospital Stay (HOSPITAL_COMMUNITY)
Admission: EM | Admit: 2020-05-23 | Discharge: 2020-05-25 | DRG: 814 | Disposition: A | Discharge status: Home / Self Care | Payer: Medicare Other | Attending: Internal Medicine | Admitting: Internal Medicine

## 2020-05-23 ENCOUNTER — Encounter (HOSPITAL_COMMUNITY): Payer: Self-pay

## 2020-05-23 DIAGNOSIS — Z20822 Contact with and (suspected) exposure to covid-19: Secondary | ICD-10-CM | POA: Diagnosis present

## 2020-05-23 DIAGNOSIS — E782 Mixed hyperlipidemia: Secondary | ICD-10-CM | POA: Diagnosis present

## 2020-05-23 DIAGNOSIS — Z681 Body mass index (BMI) 19 or less, adult: Secondary | ICD-10-CM

## 2020-05-23 DIAGNOSIS — R634 Abnormal weight loss: Secondary | ICD-10-CM | POA: Diagnosis present

## 2020-05-23 DIAGNOSIS — F419 Anxiety disorder, unspecified: Secondary | ICD-10-CM | POA: Diagnosis present

## 2020-05-23 DIAGNOSIS — D539 Nutritional anemia, unspecified: Secondary | ICD-10-CM | POA: Diagnosis present

## 2020-05-23 DIAGNOSIS — Z79899 Other long term (current) drug therapy: Secondary | ICD-10-CM

## 2020-05-23 DIAGNOSIS — E039 Hypothyroidism, unspecified: Secondary | ICD-10-CM | POA: Diagnosis present

## 2020-05-23 DIAGNOSIS — E860 Dehydration: Secondary | ICD-10-CM | POA: Diagnosis present

## 2020-05-23 DIAGNOSIS — N401 Enlarged prostate with lower urinary tract symptoms: Secondary | ICD-10-CM | POA: Diagnosis present

## 2020-05-23 DIAGNOSIS — D72829 Elevated white blood cell count, unspecified: Principal | ICD-10-CM | POA: Diagnosis present

## 2020-05-23 DIAGNOSIS — Z8661 Personal history of infections of the central nervous system: Secondary | ICD-10-CM

## 2020-05-23 DIAGNOSIS — R059 Cough, unspecified: Secondary | ICD-10-CM

## 2020-05-23 DIAGNOSIS — J301 Allergic rhinitis due to pollen: Secondary | ICD-10-CM | POA: Diagnosis present

## 2020-05-23 DIAGNOSIS — Z9049 Acquired absence of other specified parts of digestive tract: Secondary | ICD-10-CM

## 2020-05-23 DIAGNOSIS — F0390 Unspecified dementia without behavioral disturbance: Secondary | ICD-10-CM | POA: Diagnosis present

## 2020-05-23 DIAGNOSIS — M810 Age-related osteoporosis without current pathological fracture: Secondary | ICD-10-CM | POA: Diagnosis present

## 2020-05-23 DIAGNOSIS — Z96642 Presence of left artificial hip joint: Secondary | ICD-10-CM | POA: Diagnosis present

## 2020-05-23 DIAGNOSIS — Z87891 Personal history of nicotine dependence: Secondary | ICD-10-CM

## 2020-05-23 DIAGNOSIS — Z85828 Personal history of other malignant neoplasm of skin: Secondary | ICD-10-CM

## 2020-05-23 DIAGNOSIS — Z66 Do not resuscitate: Secondary | ICD-10-CM | POA: Diagnosis present

## 2020-05-23 DIAGNOSIS — E43 Unspecified severe protein-calorie malnutrition: Secondary | ICD-10-CM | POA: Insufficient documentation

## 2020-05-23 DIAGNOSIS — D580 Hereditary spherocytosis: Secondary | ICD-10-CM | POA: Diagnosis present

## 2020-05-23 DIAGNOSIS — Z79890 Hormone replacement therapy (postmenopausal): Secondary | ICD-10-CM

## 2020-05-23 HISTORY — DX: Other symptoms and signs involving cognitive functions and awareness: R41.89

## 2020-05-23 LAB — CBC
HCT: 39.9 % (ref 39.0–52.0)
Hemoglobin: 13.1 g/dL (ref 13.0–17.0)
MCH: 33.2 pg (ref 26.0–34.0)
MCHC: 32.8 g/dL (ref 30.0–36.0)
MCV: 101.3 fL — ABNORMAL HIGH (ref 80.0–100.0)
Platelets: 207 10*3/uL (ref 150–400)
RBC: 3.94 MIL/uL — ABNORMAL LOW (ref 4.22–5.81)
RDW: 21.7 % — ABNORMAL HIGH (ref 11.5–15.5)
WBC: 21.7 10*3/uL — ABNORMAL HIGH (ref 4.0–10.5)
nRBC: 0 % (ref 0.0–0.2)

## 2020-05-23 LAB — BASIC METABOLIC PANEL
Anion gap: 11 (ref 5–15)
BUN: 24 mg/dL — ABNORMAL HIGH (ref 8–23)
CO2: 24 mmol/L (ref 22–32)
Calcium: 9.7 mg/dL (ref 8.9–10.3)
Chloride: 106 mmol/L (ref 98–111)
Creatinine, Ser: 0.91 mg/dL (ref 0.61–1.24)
GFR calc non Af Amer: >60 mL/min (ref >60)
Glucose, Bld: 149 mg/dL — ABNORMAL HIGH (ref 70–99)
Potassium: 4 mmol/L (ref 3.5–5.1)
Sodium: 141 mmol/L (ref 135–145)

## 2020-05-23 MED ORDER — LACTATED RINGERS IV BOLUS
1000.0000 mL | Freq: Once | INTRAVENOUS | Status: AC
  Administered 2020-05-24: 1000 mL via INTRAVENOUS

## 2020-05-23 NOTE — ED Triage Notes (Signed)
Pt accompanied by spouse who reports cough, congestion and weakness in his legs. Pt has no complaints

## 2020-05-23 NOTE — ED Provider Notes (Addendum)
[facility] EMERGENCY DEPARTMENT Provider Note   CSN: [id number] Arrival date & time: 05/23/20  1959     History Chief Complaint  Patient presents with  . Weakness  . Cough  . Nasal Congestion    Karlton Maya is a 86 y.o. male.  Patient brought to the emergency department for evaluation of generalized weakness. History provided by his wife because he has a history of dementia. She reports that she visited him yesterday and noted that he had a lot of chest congestion and cough. Cough was productive of a white sputum. This was of concern to her because he has "a tendency for pneumonia". She visited him today and he was lying down which is unusual. He was extremely weak, legs could not hold his weight. Because of this she requested transport to the ED. Has no specific complaints at this time.        Past Medical History:  Diagnosis Date  . Cognitive decline   . Hypothyroidism   . Osteoporosis   . Thyroid disease     Patient Active Problem List   Diagnosis Date Noted  . Leukocytosis 05/24/2020  . Weight loss, unintentional 05/24/2020  . Abnormality of gait 01/01/2020  . Hypokalemia   . Sleep disturbance   . Dementia associated with other underlying disease without behavioral disturbance ([place])   . Palliative care encounter   . Lethargy   . Acute blood loss anemia   . Hypoalbuminemia due to protein-calorie malnutrition ([place])   . Microcytic anemia   . Dysphagia   . Encephalitis 10/18/2019  . Abnormal EEG   . Hypothyroidism   . Macrocytic anemia   . 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 Hip fracture ([place]) 08/12/2017  . Hallux valgus of left foot 04/28/2017  . Hammer toe of left foot 04/28/2017  . Corn of toe 04/28/2017  . History of total left hip replacement 09/19/2015  . Spherocytosis (familial) ([place]) 09/17/2015  . Mixed hyperlipidemia 06/19/2015  . Benign prostatic hyperplasia with lower urinary tract symptoms  06/19/2015  . History of fracture of vertebra 11/06/2014  . History of colon polyps 08/08/2014  . Pathological fracture 11/01/2013  . Toenail fungus 01/20/2013  . Vitamin D deficiency 09/27/2012  . Primary hypothyroidism 09/27/2012  . Osteoporosis 09/27/2012  . Spherocytosis ([place]) 12/04/2011  . Skin cancer 12/04/2011    Past Surgical History:  Procedure Laterality Date  . CHOLECYSTECTOMY    . HIP FRACTURE SURGERY    . INTRAMEDULLARY (IM) NAIL INTERTROCHANTERIC Right 08/13/2017   Procedure: INTRAMEDULLARY (IM) NAIL INTERTROCHANTRIC;  Surgeon: Earnestine Leys, MD;  Location: [facility] ORS;  Service: Orthopedics;  Laterality: Right;  . SHOULDER SURGERY         Family History  Problem Relation Age of Onset  . Hereditary spherocytosis Father   . Hereditary spherocytosis Sister     Social History   Tobacco Use  . Smoking status: Former Research scientist (life sciences)  . Smokeless tobacco: Never Used  . Tobacco comment: Quit approximately 22 years ago  Substance Use Topics  . Alcohol use: Not Currently    Comment: one vodka tonic every evening  . Drug use: Never    Home Medications Prior to Admission medications   Medication Sig Start Date End Date Taking? Authorizing Provider  acetaminophen (TYLENOL) 325 MG tablet Take 325 mg by mouth every 6 (six) hours as needed (for pain or fever).    Yes [provider]  divalproex (DEPAKOTE SPRINKLE) 125 MG capsule Take  125-250 mg by mouth See admin instructions. Take 250 mg by mouth in the morning, 125 mg at 2 PM, and 250 mg at bedtime   Yes [provider]  folic acid (FOLVITE) 1 MG tablet Take 1 mg by mouth daily.   Yes [provider]  ibandronate (BONIVA) 150 MG tablet Take 150 mg by mouth every 30 (thirty) days. Take in the morning with a full glass of water, on an empty stomach, and do not take anything else by mouth or lie down for the next 30 min.   Yes [provider]  levETIRAcetam (KEPPRA) 100 MG/ML solution Take 500 mg  by mouth 2 (two) times daily.   Yes [provider]  levothyroxine (SYNTHROID) 125 MCG tablet Take 1 tablet (125 mcg total) by mouth daily at 6 (six) AM. 11/17/19  Yes Angiulli, Lavon Paganini, PA-C  lidocaine (LIDODERM) 5 % Place 1 patch onto the skin daily. Remove & Discard patch within 12 hours or as directed by MD Patient taking differently: Place 1 patch onto the skin daily as needed (for pain- Remove & Discard patch after 12 hours).  11/16/19  Yes Angiulli, Lavon Paganini, PA-C  LORazepam (ATIVAN) 0.5 MG tablet Take 0.25 mg by mouth every 8 (eight) hours as needed (for agitation).    Yes [provider]  melatonin 3 MG TABS tablet Take 1 tablet (3 mg total) by mouth at bedtime. 11/16/19  Yes Angiulli, Lavon Paganini, PA-C  Multiple Vitamins-Minerals (LIFE PACK MENS) MISC Take 1 each by mouth See admin instructions. Mix 1 packet into 8 ounces of water and drink once daily at 2 PM   Yes [provider]  fluticasone (FLONASE) 50 MCG/ACT nasal spray Place 1 spray into both nostrils daily. Patient not taking: Reported on 05/24/2020 11/17/19   Angiulli, Lavon Paganini, PA-C    Allergies    Pollen extract  Review of Systems   Review of Systems  Respiratory: Positive for cough.   Neurological: Positive for weakness.  All other systems reviewed and are negative.   Physical Exam Updated Vital Signs BP 115/73 (BP Location: Left Arm)   Pulse 96   Temp 97.7 F (36.5 C) (Oral)   Resp 16   SpO2 96%   Physical Exam Vitals and nursing note reviewed.  Constitutional:      General: He is not in acute distress.    Appearance: Normal appearance. He is well-developed.  HENT:     Head: Normocephalic and atraumatic.     Right Ear: Hearing normal.     Left Ear: Hearing normal.     Nose: Nose normal.  Eyes:     Conjunctiva/sclera: Conjunctivae normal.     Pupils: Pupils are equal, round, and reactive to light.  Cardiovascular:     Rate and Rhythm: Regular rhythm. Tachycardia present.     Heart  sounds: S1 normal and S2 normal. No murmur heard.  No friction rub. No gallop.   Pulmonary:     Effort: Pulmonary effort is normal. No respiratory distress.     Breath sounds: Normal breath sounds.  Chest:     Chest wall: No tenderness.  Abdominal:     General: Bowel sounds are normal.     Palpations: Abdomen is soft.     Tenderness: There is no abdominal tenderness. There is no guarding or rebound. Negative signs include Murphy's sign and McBurney's sign.     Hernia: No hernia is present.  Musculoskeletal:  General: Normal range of motion.     Cervical back: Normal range of motion and neck supple.  Skin:    General: Skin is warm and dry.     Findings: No rash.  Neurological:     Mental Status: He is alert and oriented to person, place, and time.     GCS: GCS eye subscore is 4. GCS verbal subscore is 5. GCS motor subscore is 6.     Cranial Nerves: No cranial nerve deficit.     Sensory: No sensory deficit.     Coordination: Coordination normal.  Psychiatric:        Speech: Speech normal.        Behavior: Behavior normal.        Thought Content: Thought content normal.     ED Results / Procedures / Treatments   Labs (all labs ordered are listed, but only abnormal results are displayed) Labs Reviewed  BASIC METABOLIC PANEL - Abnormal; Notable for the following components:      Result Value   Glucose, Bld 149 (*)    BUN 24 (*)    All other components within normal limits  CBC - Abnormal; Notable for the following components:   WBC 21.7 (*)    RBC 3.94 (*)    MCV 101.3 (*)    RDW 21.7 (*)    All other components within normal limits  URINALYSIS, ROUTINE W REFLEX MICROSCOPIC - Abnormal; Notable for the following components:   Color, Urine AMBER (*)    APPearance HAZY (*)    All other components within normal limits  HEPATIC FUNCTION PANEL - Abnormal; Notable for the following components:   Total Bilirubin 3.9 (*)    Bilirubin, Direct 0.3 (*)    Indirect Bilirubin  3.6 (*)    All other components within normal limits  RESPIRATORY PANEL BY RT PCR (FLU A&B, COVID)  CULTURE, BLOOD (ROUTINE X 2)  CULTURE, BLOOD (ROUTINE X 2)  URINE CULTURE  LACTIC ACID, PLASMA  CBC  COMPREHENSIVE METABOLIC PANEL    EKG EKG Interpretation  Date/Time:  Thursday May 23 2020 20:11:19 EDT Ventricular Rate:  129 PR Interval:  154 QRS Duration: 110 QT Interval:  332 QTC Calculation: 486 R Axis:   78 Text Interpretation: Sinus tachycardia Right bundle branch block Abnormal ECG Confirmed by Orpah Greek [phone]) on 05/23/2020 11:07:14 PM   Radiology DG Chest 2 View  Result Date: 05/23/2020 CLINICAL DATA:  Cough and weakness EXAM: CHEST - 2 VIEW COMPARISON:  March 30, 2020 FINDINGS: The heart size and mediastinal contours are within normal limits. Both lungs are clear. Aortic knob calcifications are seen. There is healed rib fractures in the lower left chest. Prior fixation is seen at the left proximal humerus. IMPRESSION: No active cardiopulmonary disease. Electronically Signed   By: Prudencio Pair M.D.   On: 05/23/2020 20:48    Procedures Procedures (including critical care time)  Medications Ordered in ED Medications  LORazepam (ATIVAN) tablet 0.25 mg (has no administration in time range)  levothyroxine (SYNTHROID) tablet 125 mcg (has no administration in time range)  folic acid (FOLVITE) tablet 1 mg (has no administration in time range)  melatonin tablet 3 mg (has no administration in time range)  divalproex (DEPAKOTE SPRINKLE) capsule 125 mg (has no administration in time range)  levETIRAcetam (KEPPRA) tablet 500 mg (has no administration in time range)  enoxaparin (LOVENOX) injection 40 mg (has no administration in time range)  multivitamin with minerals tablet 1 tablet (has no  administration in time range)  lactated ringers bolus 1,000 mL (0 mLs Intravenous Stopped 05/24/20 0226)  LORazepam (ATIVAN) injection 1 mg (1 mg Intravenous Given 05/24/20  0428)    ED Course  I have reviewed the triage vital signs and the nursing notes.  Pertinent labs & imaging results that were available during my care of the patient were reviewed by me and considered in my medical decision making (see chart for details).    MDM Rules/Calculators/A&P                          Patient presents to the emergency department for evaluation of generalized weakness.  Patient has not been feeling well since yesterday.  Yesterday he was having cough and chest congestion and then today he has become very weak and cannot stand on his own.  This is unusual for him.  Work-up concerning for infection but no source found.  He had a white count of 21,000.  He was tachycardic at arrival but this resolved with a fluid bolus.  He has not been febrile here in the ER.  Urinalysis does not suggest infection.  Chest x-ray is clear.  Covid is negative.  Remainder of lab work was unremarkable.  Will require hospitalization for further management of possible infection.  Final Clinical Impression(s) / ED Diagnoses Final diagnoses:  Leukocytosis, unspecified type    Rx / DC Orders ED Discharge Orders    None       Chenoah Mcnally, Gwenyth Allegra, MD 05/24/20 0630    Orpah Greek, MD 05/24/20 [phone]

## 2020-05-24 ENCOUNTER — Other Ambulatory Visit: Payer: Self-pay

## 2020-05-24 ENCOUNTER — Encounter (HOSPITAL_COMMUNITY): Payer: Self-pay | Admitting: Internal Medicine

## 2020-05-24 ENCOUNTER — Inpatient Hospital Stay (HOSPITAL_COMMUNITY): Payer: Medicare Other

## 2020-05-24 DIAGNOSIS — R634 Abnormal weight loss: Secondary | ICD-10-CM | POA: Diagnosis present

## 2020-05-24 DIAGNOSIS — Z96642 Presence of left artificial hip joint: Secondary | ICD-10-CM | POA: Diagnosis present

## 2020-05-24 DIAGNOSIS — E039 Hypothyroidism, unspecified: Secondary | ICD-10-CM | POA: Diagnosis present

## 2020-05-24 DIAGNOSIS — Z85828 Personal history of other malignant neoplasm of skin: Secondary | ICD-10-CM | POA: Diagnosis not present

## 2020-05-24 DIAGNOSIS — D580 Hereditary spherocytosis: Secondary | ICD-10-CM | POA: Diagnosis present

## 2020-05-24 DIAGNOSIS — D539 Nutritional anemia, unspecified: Secondary | ICD-10-CM | POA: Diagnosis present

## 2020-05-24 DIAGNOSIS — J301 Allergic rhinitis due to pollen: Secondary | ICD-10-CM | POA: Diagnosis present

## 2020-05-24 DIAGNOSIS — Z8661 Personal history of infections of the central nervous system: Secondary | ICD-10-CM | POA: Diagnosis not present

## 2020-05-24 DIAGNOSIS — Z20822 Contact with and (suspected) exposure to covid-19: Secondary | ICD-10-CM | POA: Diagnosis present

## 2020-05-24 DIAGNOSIS — F0390 Unspecified dementia without behavioral disturbance: Secondary | ICD-10-CM | POA: Diagnosis present

## 2020-05-24 DIAGNOSIS — Z79890 Hormone replacement therapy (postmenopausal): Secondary | ICD-10-CM | POA: Diagnosis not present

## 2020-05-24 DIAGNOSIS — E43 Unspecified severe protein-calorie malnutrition: Secondary | ICD-10-CM | POA: Diagnosis present

## 2020-05-24 DIAGNOSIS — Z681 Body mass index (BMI) 19 or less, adult: Secondary | ICD-10-CM | POA: Diagnosis not present

## 2020-05-24 DIAGNOSIS — E860 Dehydration: Secondary | ICD-10-CM | POA: Diagnosis present

## 2020-05-24 DIAGNOSIS — N401 Enlarged prostate with lower urinary tract symptoms: Secondary | ICD-10-CM | POA: Diagnosis present

## 2020-05-24 DIAGNOSIS — M810 Age-related osteoporosis without current pathological fracture: Secondary | ICD-10-CM | POA: Diagnosis present

## 2020-05-24 DIAGNOSIS — D72829 Elevated white blood cell count, unspecified: Secondary | ICD-10-CM | POA: Diagnosis present

## 2020-05-24 DIAGNOSIS — Z66 Do not resuscitate: Secondary | ICD-10-CM | POA: Diagnosis present

## 2020-05-24 DIAGNOSIS — Z87891 Personal history of nicotine dependence: Secondary | ICD-10-CM | POA: Diagnosis not present

## 2020-05-24 DIAGNOSIS — E782 Mixed hyperlipidemia: Secondary | ICD-10-CM | POA: Diagnosis present

## 2020-05-24 DIAGNOSIS — Z9049 Acquired absence of other specified parts of digestive tract: Secondary | ICD-10-CM | POA: Diagnosis not present

## 2020-05-24 DIAGNOSIS — Z79899 Other long term (current) drug therapy: Secondary | ICD-10-CM | POA: Diagnosis not present

## 2020-05-24 DIAGNOSIS — F419 Anxiety disorder, unspecified: Secondary | ICD-10-CM | POA: Diagnosis present

## 2020-05-24 LAB — CBC
HCT: 34.3 % — ABNORMAL LOW (ref 39.0–52.0)
Hemoglobin: 11.6 g/dL — ABNORMAL LOW (ref 13.0–17.0)
MCH: 34.1 pg — ABNORMAL HIGH (ref 26.0–34.0)
MCHC: 33.8 g/dL (ref 30.0–36.0)
MCV: 100.9 fL — ABNORMAL HIGH (ref 80.0–100.0)
Platelets: 161 10*3/uL (ref 150–400)
RBC: 3.4 MIL/uL — ABNORMAL LOW (ref 4.22–5.81)
RDW: 21.4 % — ABNORMAL HIGH (ref 11.5–15.5)
WBC: 13.2 10*3/uL — ABNORMAL HIGH (ref 4.0–10.5)
nRBC: 0 % (ref 0.0–0.2)

## 2020-05-24 LAB — COMPREHENSIVE METABOLIC PANEL
ALT: 23 U/L (ref 0–44)
AST: 27 U/L (ref 15–41)
Albumin: 3.1 g/dL — ABNORMAL LOW (ref 3.5–5.0)
Alkaline Phosphatase: 43 U/L (ref 38–126)
Anion gap: 7 (ref 5–15)
BUN: 21 mg/dL (ref 8–23)
CO2: 29 mmol/L (ref 22–32)
Calcium: 9.1 mg/dL (ref 8.9–10.3)
Chloride: 108 mmol/L (ref 98–111)
Creatinine, Ser: 0.83 mg/dL (ref 0.61–1.24)
GFR calc non Af Amer: >60 mL/min (ref >60)
Glucose, Bld: 99 mg/dL (ref 70–99)
Potassium: 3.9 mmol/L (ref 3.5–5.1)
Sodium: 144 mmol/L (ref 135–145)
Total Bilirubin: 3.1 mg/dL — ABNORMAL HIGH (ref 0.3–1.2)
Total Protein: 5.7 g/dL — ABNORMAL LOW (ref 6.5–8.1)

## 2020-05-24 LAB — URINALYSIS, ROUTINE W REFLEX MICROSCOPIC
Bilirubin Urine: NEGATIVE
Glucose, UA: NEGATIVE mg/dL
Hgb urine dipstick: NEGATIVE
Ketones, ur: NEGATIVE mg/dL
Leukocytes,Ua: NEGATIVE
Nitrite: NEGATIVE
Protein, ur: NEGATIVE mg/dL
Specific Gravity, Urine: 1.017 (ref 1.005–1.030)
pH: 7 (ref 5.0–8.0)

## 2020-05-24 LAB — HEPATIC FUNCTION PANEL
ALT: 26 U/L (ref 0–44)
ALT: 22 U/L (ref 0–44)
AST: 28 U/L (ref 15–41)
AST: 23 U/L (ref 15–41)
Albumin: 3.6 g/dL (ref 3.5–5.0)
Albumin: 3.1 g/dL — ABNORMAL LOW (ref 3.5–5.0)
Alkaline Phosphatase: 51 U/L (ref 38–126)
Alkaline Phosphatase: 46 U/L (ref 38–126)
Bilirubin, Direct: 0.3 mg/dL — ABNORMAL HIGH (ref 0.0–0.2)
Bilirubin, Direct: 0.4 mg/dL — ABNORMAL HIGH (ref 0.0–0.2)
Indirect Bilirubin: 3.6 mg/dL — ABNORMAL HIGH (ref 0.3–0.9)
Indirect Bilirubin: 2.5 mg/dL — ABNORMAL HIGH (ref 0.3–0.9)
Total Bilirubin: 3.9 mg/dL — ABNORMAL HIGH (ref 0.3–1.2)
Total Bilirubin: 2.9 mg/dL — ABNORMAL HIGH (ref 0.3–1.2)
Total Protein: 6.5 g/dL (ref 6.5–8.1)
Total Protein: 5.8 g/dL — ABNORMAL LOW (ref 6.5–8.1)

## 2020-05-24 LAB — RESPIRATORY PANEL BY RT PCR (FLU A&B, COVID)
Influenza A by PCR: NEGATIVE
Influenza B by PCR: NEGATIVE
SARS Coronavirus 2 by RT PCR: NEGATIVE

## 2020-05-24 LAB — VITAMIN B12: Vitamin B-12: 425 pg/mL (ref 180–914)

## 2020-05-24 LAB — LACTATE DEHYDROGENASE: LDH: 156 U/L (ref 98–192)

## 2020-05-24 LAB — LACTIC ACID, PLASMA: Lactic Acid, Venous: 0.8 mmol/L (ref 0.5–1.9)

## 2020-05-24 MED ORDER — ENSURE ENLIVE PO LIQD
237.0000 mL | Freq: Two times a day (BID) | ORAL | Status: DC
  Administered 2020-05-24 (×2): 237 mL via ORAL

## 2020-05-24 MED ORDER — MELATONIN 3 MG PO TABS
3.0000 mg | ORAL_TABLET | Freq: Every day | ORAL | Status: DC
  Administered 2020-05-24: 3 mg via ORAL
  Filled 2020-05-24: qty 1

## 2020-05-24 MED ORDER — FOLIC ACID 1 MG PO TABS
1.0000 mg | ORAL_TABLET | Freq: Every day | ORAL | Status: DC
  Administered 2020-05-24 – 2020-05-25 (×2): 1 mg via ORAL
  Filled 2020-05-24 (×2): qty 1

## 2020-05-24 MED ORDER — ADULT MULTIVITAMIN W/MINERALS CH
1.0000 | ORAL_TABLET | Freq: Every day | ORAL | Status: DC
  Administered 2020-05-24 – 2020-05-25 (×2): 1 via ORAL
  Filled 2020-05-24 (×2): qty 1

## 2020-05-24 MED ORDER — LORAZEPAM 2 MG/ML IJ SOLN
1.0000 mg | Freq: Once | INTRAMUSCULAR | Status: AC
  Administered 2020-05-24: 1 mg via INTRAVENOUS
  Filled 2020-05-24: qty 1

## 2020-05-24 MED ORDER — LEVETIRACETAM 500 MG PO TABS
500.0000 mg | ORAL_TABLET | Freq: Two times a day (BID) | ORAL | Status: DC
  Administered 2020-05-24 – 2020-05-25 (×3): 500 mg via ORAL
  Filled 2020-05-24 (×3): qty 1

## 2020-05-24 MED ORDER — NYSTATIN 100000 UNIT/ML MT SUSP
5.0000 mL | Freq: Four times a day (QID) | OROMUCOSAL | Status: DC
  Administered 2020-05-24 – 2020-05-25 (×5): 500000 [IU] via ORAL
  Filled 2020-05-24 (×4): qty 5

## 2020-05-24 MED ORDER — LORAZEPAM 0.5 MG PO TABS: 0.2500 mg | ORAL_TABLET | Freq: Three times a day (TID) | ORAL | Status: DC | PRN

## 2020-05-24 MED ORDER — DIVALPROEX SODIUM 125 MG PO CSDR
125.0000 mg | DELAYED_RELEASE_CAPSULE | Freq: Three times a day (TID) | ORAL | Status: DC
  Administered 2020-05-24 – 2020-05-25 (×4): 125 mg via ORAL
  Filled 2020-05-24 (×7): qty 1

## 2020-05-24 MED ORDER — ENOXAPARIN SODIUM 40 MG/0.4ML ~~LOC~~ SOLN
40.0000 mg | Freq: Every day | SUBCUTANEOUS | Status: DC
  Administered 2020-05-24 – 2020-05-25 (×2): 40 mg via SUBCUTANEOUS
  Filled 2020-05-24 (×2): qty 0.4

## 2020-05-24 MED ORDER — LACTATED RINGERS IV SOLN: INTRAVENOUS | Status: AC

## 2020-05-24 MED ORDER — ENSURE ENLIVE PO LIQD
237.0000 mL | Freq: Three times a day (TID) | ORAL | Status: DC
  Administered 2020-05-24 – 2020-05-25 (×2): 237 mL via ORAL

## 2020-05-24 MED ORDER — LEVOTHYROXINE SODIUM 25 MCG PO TABS
125.0000 ug | ORAL_TABLET | Freq: Every day | ORAL | Status: DC
  Administered 2020-05-25: 125 ug via ORAL
  Filled 2020-05-24: qty 1

## 2020-05-24 NOTE — ED Notes (Signed)
Wife [name], [phone]. Call for updates.

## 2020-05-24 NOTE — Progress Notes (Signed)
Initial Nutrition Assessment  DOCUMENTATION CODES:   Severe malnutrition in context of chronic illness  INTERVENTION:  Please obtain new weight.   Ensure Enlive po TID, each supplement provides 350 kcal and 20 grams of protein  Continue MVI daily  If pt's PO intake does not improve, recommend initiation of enteral nutrition via NGT/Cortrak if aligned with pt's GOC. Consider:  -Osmolite 1.2 cal @ 15ml/hr, advance 73ml/hr Q8H until goal rate of 41ml/hr (1570ml) is reached  -8ml Prosource TF daily  Recommended tube feeding regimen would provide 1912 kcals, 97 grams of protein, 1237ml free water     NUTRITION DIAGNOSIS:   Severe Malnutrition related to chronic illness (seizure (09/2019)) as evidenced by severe fat depletion, severe muscle depletion.    GOAL:   Patient will meet greater than or equal to 90% of their needs    MONITOR:   PO intake, Supplement acceptance, Labs, Weight trends, I & O's  REASON FOR ASSESSMENT:   Consult Assessment of nutrition requirement/status, Poor PO  ASSESSMENT:   Pt admitted with leukocytosis of unknown etiology. PMH includes cognitive decline, unintentional wt loss of ~50lbs since 09/2019, seizure (09/2019), h/o squamous cell carcinoma, osteoporosis.  Pt unable to provide any meaningful history.  Of note, no wt has been obtained for this admission. Last documented wt is from 03/30/20 (52kg). Will use this to determine pt's needs at this time. Unable to confirm reported 50lb wt loss based on current wt readings.   PO Intake: 0% x 2 recorded meals  Labs reviewed. Medications: folvite, mvi  NUTRITION - FOCUSED PHYSICAL EXAM:    Most Recent Value  Orbital Region Severe depletion  Upper Arm Region Severe depletion  Thoracic and Lumbar Region Severe depletion  Buccal Region Severe depletion  Temple Region Severe depletion  Clavicle Bone Region Severe depletion  Clavicle and Acromion Bone Region Severe depletion  Scapular Bone  Region Severe depletion  Dorsal Hand Severe depletion  Patellar Region Severe depletion  Anterior Thigh Region Severe depletion  Posterior Calf Region Severe depletion  Edema (RD Assessment) None  Hair Reviewed  Eyes Reviewed  Mouth Reviewed  Skin Reviewed  Nails Reviewed       Diet Order:   Diet Order            Diet regular Room service appropriate? Yes; Fluid consistency: Thin  Diet effective now                 EDUCATION NEEDS:   Not appropriate for education at this time  Skin:  Skin Assessment: Skin Integrity Issues: Skin Integrity Issues:: Other (Comment) Other: MASD scrotum  Last BM:  PTA  Height:   Ht Readings from Last 1 Encounters:  03/30/20 5\' 8"  (1.727 m)    Weight:   Wt Readings from Last 1 Encounters:  03/30/20 52 kg    BMI:  There is no height or weight on file to calculate BMI.  Estimated Nutritional Needs:   Kcal:  1800-2000  Protein:  90-105 grams  Fluid:  >/=1.8L/d    Eric Ina, MS, RD, LDN RD pager number and weekend/on-call pager number located in [place].

## 2020-05-24 NOTE — ED Notes (Signed)
Pt found to be sitting on the edge of bed trying to get out of bed. Pt is fidgety and removing monitoring equipment. Pt continues to say "I need to get out of here. I gotta go. I [employer] my resources." Attempted to redirect patient, but attempts were unsuccessful. Notified MD of patient's condition.

## 2020-05-24 NOTE — ED Notes (Signed)
ED Provider at bedside. 

## 2020-05-24 NOTE — H&P (Addendum)
History and Physical   Eric Larsen ZDG:[id number] DOB: 1931-01-29 DOA: 05/23/2020  PCP: [name], MD  Patient coming from: dementia facility  I have personally briefly reviewed patient's old medical records in [place]  Chief Concern: productive cough  HPI: Eric Larsen is a 86 y.o. male with medical history significant for cognitive decline, unintentional weight loss of approximately 50 lbs since February 2021, seizure in February 2021 on depakote and keppra, history of squamous cell carcinoma, osteopenia, former tobacco user (quit approximatley 22 yrs ago).   Per spouse at bedside, Eric Larsen has been coughing up white sputum x 1 day. She brought him in because she states 'he has a tendency to get pneumonia'. She denies fever, vomiting. Patient denies chest pain, shortness of breath, abdominal pain, urinary changes. She denies recent changes to medications and endorsed poor appetite since February 2021. She believes he has lost about 40-50 pounds since February. She denies recent procedure, abx use or hospitalizations.   ED Course: discussed with ED provider. Patient brought in by spouse for productive cough x1 day. CXR was negative. WBC elevated. Admit for infectous workup  Vitals stable in the ED.   WBC: 21.7,  Direct bili 0.3, T bili 3.9, however these have been elevated in the pase and patient did not have abdominal pain and has history of cholecystectomy.   Review of Systems: As per HPI otherwise 10 point review of systems negative.  Past Medical History:  Diagnosis Date  . Cognitive decline   . Hypothyroidism   . Osteoporosis   . Thyroid disease    Past Surgical History:  Procedure Laterality Date  . CHOLECYSTECTOMY    . HIP FRACTURE SURGERY    . INTRAMEDULLARY (IM) NAIL INTERTROCHANTERIC Right 08/13/2017   Procedure: INTRAMEDULLARY (IM) NAIL INTERTROCHANTRIC;  Surgeon: Earnestine Leys, MD;  Location: [facility] ORS;  Service: Orthopedics;   Laterality: Right;  . SHOULDER SURGERY     Social History:  reports that he has quit smoking. He has never used smokeless tobacco. He reports previous alcohol use. He reports that he does not use drugs.  Allergies  Allergen Reactions  . Pollen Extract Itching and Other (See Comments)    On MAR- Itchy eyes, runny nose, nasal congestion   Family History  Problem Relation Age of Onset  . Hereditary spherocytosis Father   . Hereditary spherocytosis Sister    Family history: Family history reviewed and not pertinent  Prior to Admission medications   Medication Sig Start Date End Date Taking? Authorizing Provider  acetaminophen (TYLENOL) 325 MG tablet Take 325 mg by mouth every 6 (six) hours as needed for mild pain, moderate pain or fever.     [provider]  divalproex (DEPAKOTE SPRINKLE) 125 MG capsule Take 125 mg by mouth 3 (three) times daily.     [provider]  fluticasone (FLONASE) 50 MCG/ACT nasal spray Place 1 spray into both nostrils daily. Patient not taking: Reported on 03/30/2020 11/17/19   Angiulli, Lavon Paganini, PA-C  folic acid (FOLVITE) 1 MG tablet Take 1 mg by mouth daily.    [provider]  levETIRAcetam (KEPPRA) 100 MG/ML solution Take 500 mg by mouth 2 (two) times daily.    [provider]  levothyroxine (SYNTHROID) 125 MCG tablet Take 1 tablet (125 mcg total) by mouth daily at 6 (six) AM. 11/17/19   Angiulli, Lavon Paganini, PA-C  lidocaine (LIDODERM) 5 % Place 1 patch onto the skin daily. Remove & Discard patch within 12 hours or  as directed by MD Patient taking differently: Place 1 patch onto the skin daily as needed (pain). Remove & Discard patch after 12 hours 11/16/19   Angiulli, Lavon Paganini, PA-C  LORazepam (ATIVAN) 0.5 MG tablet Take 0.25 mg by mouth every 8 (eight) hours as needed for anxiety (agitation).     [provider]  melatonin 3 MG TABS tablet Take 1 tablet (3 mg total) by mouth at bedtime. 11/16/19   Angiulli, Lavon Paganini, PA-C    OVER THE COUNTER MEDICATION Take 1 packet by mouth daily at 2 PM. Mix 1 packet in 8oz of water and drink. LIFE PAK ANTI-AGING SUPPLEMENT    [provider]   Physical Exam: Vitals:   05/24/20 0200 05/24/20 0245 05/24/20 0430 05/24/20 0453  BP: 118/64 125/76 (!) 145/71 115/73  Pulse: 69 81 (!) 104 96  Resp:  16 16   Temp:   98.4 F (36.9 C) 97.7 F (36.5 C)  TempSrc:   Axillary Oral  SpO2: 97% 93% 95% 96%   Constitutional: bilateral temporal wasting, and cachetic appearing NAD, calm, comfortable Eyes: PERRL, lids and conjunctivae normal ENMT: Mucous membranes are moist. Posterior pharynx clear of any exudate or lesions.Normal dentition.  Neck: normal, supple, no masses, no thyromegaly Respiratory: clear to auscultation bilaterally, no wheezing, no crackles. Normal respiratory effort. No accessory muscle use.  Cardiovascular: Regular rate and rhythm, no murmurs / rubs / gallops. No extremity edema. 2+ pedal pulses. No carotid bruits.  Abdomen: no tenderness, no masses palpated. No hepatosplenomegaly. Bowel sounds positive.  Musculoskeletal: no clubbing / cyanosis. No joint deformity upper and lower extremities. Good ROM, no contractures. Normal muscle tone.  Skin: skin lesion present on left temple approximately 0.5 cm Neurologic: CN 2-12 grossly intact. Sensation intact, DTR normal. Strength 5/5 in all 4.  Psychiatric: poor insight and exhibits compensation. Alert to self and spouse. Not able to tell me his age or current location, or year. Pleasant mood.   Labs on Admission: I have personally reviewed following labs and imaging studies  CBC: Recent Labs  Lab 05/23/20 2025  WBC 21.7*  HGB 13.1  HCT 39.9  MCV 101.3*  PLT 604   Basic Metabolic Panel: Recent Labs  Lab 05/23/20 2025  NA 141  K 4.0  CL 106  CO2 24  GLUCOSE 149*  BUN 24*  CREATININE 0.91  CALCIUM 9.7   GFR: CrCl cannot be calculated (Unknown ideal weight.). Liver Function Tests: Recent Labs   Lab 05/24/20 0011  AST 28  ALT 26  ALKPHOS 51  BILITOT 3.9*  PROT 6.5  ALBUMIN 3.6   No results for input(s): LIPASE, AMYLASE in the last 168 hours. No results for input(s): AMMONIA in the last 168 hours. Coagulation Profile: No results for input(s): INR, PROTIME in the last 168 hours. Cardiac Enzymes: No results for input(s): CKTOTAL, CKMB, CKMBINDEX, TROPONINI in the last 168 hours. BNP (last 3 results) No results for input(s): PROBNP in the last 8760 hours. HbA1C: No results for input(s): HGBA1C in the last 72 hours. CBG: No results for input(s): GLUCAP in the last 168 hours. Lipid Profile: No results for input(s): CHOL, HDL, LDLCALC, TRIG, CHOLHDL, LDLDIRECT in the last 72 hours. Thyroid Function Tests: No results for input(s): TSH, T4TOTAL, FREET4, T3FREE, THYROIDAB in the last 72 hours. Anemia Panel: No results for input(s): VITAMINB12, FOLATE, FERRITIN, TIBC, IRON, RETICCTPCT in the last 72 hours. Urine analysis:    Component Value Date/Time   COLORURINE AMBER (A) 05/24/2020 0222  APPEARANCEUR HAZY (A) 05/24/2020 0222   LABSPEC 1.017 05/24/2020 0222   PHURINE 7.0 05/24/2020 0222   GLUCOSEU NEGATIVE 05/24/2020 0222   HGBUR NEGATIVE 05/24/2020 0222   BILIRUBINUR NEGATIVE 05/24/2020 0222   KETONESUR NEGATIVE 05/24/2020 0222   PROTEINUR NEGATIVE 05/24/2020 0222   NITRITE NEGATIVE 05/24/2020 0222   LEUKOCYTESUR NEGATIVE 05/24/2020 0222   Radiological Exams on Admission: Personally reviewed and I agree with radiologist reading as below. DG Chest 2 View  Result Date: 05/23/2020 CLINICAL DATA:  Cough and weakness EXAM: CHEST - 2 VIEW COMPARISON:  March 30, 2020 FINDINGS: The heart size and mediastinal contours are within normal limits. Both lungs are clear. Aortic knob calcifications are seen. There is healed rib fractures in the lower left chest. Prior fixation is seen at the left proximal humerus. IMPRESSION: No active cardiopulmonary disease. Electronically Signed    By: Prudencio Pair M.D.   On: 05/23/2020 20:48   EKG: Independently reviewed, showing sinus tachycardia with rate of 128  Assessment/Plan Active Problems:   Primary hypothyroidism   Leukocytosis   Weight loss, unintentional   # Leukocytosis - etiology unclear at this time - In setting of weight loss, carcinoma can be considered if appropriate - CBC in the AM - Continue LR ivf - No clear source of infection at this time  - Blood culture x 2 ordered  # Anxiety/agitation - patient takes ativan 0.25 mg daily - One dose given for agitation in the ED - Would recommend spouse remain in room if possible to decrease delirium  # Hypothyroid - resumed home levothyroxine 125 mcg daily  # Malnutrition - moderate vs severe; likely protein - Poor po intake however may consider carcinoma given elevated WBC - Dietary consult  DVT prophylaxis: enoxaparin 40 Code Status: DNR Family Communication: discussed with spouse Disposition Plan: pending clinical course Consults called: not at this time Admission status: inpatient  Eric Larsen N Reynald Woods D.O. [facility]  If 7AM-7PM, please contact day-coverage [email or web address]  05/24/2020, 6:50 AM

## 2020-05-24 NOTE — Progress Notes (Signed)
Wife at bedside. Leaving briefs and sweat pants for when patient is discharged back to the facility so he has clean clothes to get into when being sent back.

## 2020-05-24 NOTE — Progress Notes (Signed)
New Admission Note:  Arrival Method: Stretcher from ED Mental Orientation:  A&Ox4 Telemetry: Box # 15 Assessment: Completed Skin: Intact, MASD to bottom IV: PIV R UA Pain: 0/10 Tubes: None Safety Measures: Safety Fall Prevention Plan was discussed  Admission: Initiated Belongings: Clothing in bag at bedside Unit Orientation: Patient has been orientated to the room, unit, and the staff.  Orders have been reviewed and implemented. Call light has been placed within reach and bed alarm has been activated. Will continue to monitor the patient.  Percell Boston, RN

## 2020-05-24 NOTE — Progress Notes (Signed)
MD ordered activity vest for patient. I removed his mits and oriented him to the vest and doing well.

## 2020-05-24 NOTE — Evaluation (Signed)
Physical Therapy Evaluation Patient Details Name: Eric Larsen MRN: [id number] DOB: 1930-12-21 Today's Date: 05/24/2020   History of Present Illness  Eric Larsen is a 86 y.o. male with medical history significant for cognitive decline, unintentional weight loss of approximately 50 lbs since February 2021, seizure in February 2021 on depakote and keppra, history of squamous cell carcinoma, osteopenia, former tobacco user (quit approximatley 22 yrs ago). Per spouse at bedside, Eric Larsen has been coughing up white sputum x 1 day. She brought him in because she states 'he has a tendency to get pneumonia'  Clinical Impression  PTA, patient lived in the memory care unit at [facility] and was ambulatory with RW per wife. Patient A&Ox1 upon arrival. Patient required modA for bed mobility and modAx2 for transfer and initiation of ambulation (sidesteps). Patient sidestepped 3 feet to [facility] with RW and modAx2 with cues required for sequencing. Patient demonstrates generalized weakness, impaired balance, decreased activity tolerance, and impaired cognition. Patient will benefit from skilled PT services during his acute stay to address the deficits listed above. Recommend rehab following discharge to maximize functional mobility.    Follow Up Recommendations SNF;Supervision/Assistance - 24 hour    Equipment Recommendations  None recommended by PT    Recommendations for Other Services       Precautions / Restrictions Precautions Precautions: Fall Restrictions Weight Bearing Restrictions: No Other Position/Activity Restrictions: Mitten restraints      Mobility  Bed Mobility Overal bed mobility: Needs Assistance Bed Mobility: Supine to Sit;Sit to Supine     Supine to sit: Mod assist Sit to supine: Mod assist   General bed mobility comments: verbal and tactile cues for sequencing   Transfers Overall transfer level: Needs assistance Equipment used: Rolling Eric Larsen (2  wheeled) Transfers: Sit to/from Stand Sit to Stand: Mod assist;+2 physical assistance         General transfer comment: Required modAx2 with noted posterior lean; cues for upright posture with inconsistent follow through  Ambulation/Gait Ambulation/Gait assistance: Mod assist;+2 physical assistance Gait Distance (Feet): 3 Feet (side step to [facility]) Assistive device: Rolling Eric Larsen (2 wheeled) Gait Pattern/deviations: Narrow base of support     General Gait Details: verbal and visual/tactile cues required for sequencing; noted posterior lean during standing  Stairs            Wheelchair Mobility    Modified Rankin (Stroke Patients Only)       Balance Overall balance assessment: Needs assistance Sitting-balance support: Feet supported;Bilateral upper extremity supported Sitting balance-[name]: Poor Sitting balance - Comments: pt required assist posteriorly to maintain upright sitting EOB. cues provided for upright posture however poor follow through   Standing balance support: Bilateral upper extremity supported Standing balance-[name]: Zero Standing balance comment: Requires modAx2 to maintain standing balance with B UE support due to posterior lean and unable to correct with cueing.                              Pertinent Vitals/Pain Pain Assessment: No/denies pain    Home Living Family/patient expects to be discharged to:: Skilled nursing facility (Memory care unit at [facility])                      Prior Function Level of Independence: Needs assistance   Gait / Transfers Assistance Needed: [name] states he was able to ambulate with rollator for household/facility distances but used RW for community distances.   ADL's /  Homemaking Assistance Needed: Requires assistance for bathing and dressing, locating dining room at [facility]        Extremity/Trunk Assessment   Upper Extremity Assessment Upper  Extremity Assessment: Generalized weakness    Lower Extremity Assessment Lower Extremity Assessment: Generalized weakness    Cervical / Trunk Assessment Cervical / Trunk Assessment: Kyphotic  Communication   Communication: No difficulties  Cognition Arousal/Alertness: Awake/alert Behavior During Therapy: Flat affect;Anxious Overall Cognitive Status: Impaired/Different from baseline Area of Impairment: Orientation;Memory;Following commands;Safety/judgement;Attention;Awareness;Problem solving                 Orientation Level: Disoriented to;Place;Time;Situation Current Attention Level: Selective Memory: Decreased short-term memory Following Commands: Follows one step commands inconsistently Safety/Judgement: Decreased awareness of safety;Decreased awareness of deficits Awareness: Intellectual Problem Solving: Slow processing;Decreased initiation;Difficulty sequencing;Requires verbal cues;Requires tactile cues General Comments: hx of dementia      General Comments General comments (skin integrity, edema, etc.): Pt with hx of dementia. Wife indicated she is with him at the memory care unit at [facility] every afternoon and has seen a difference mentally since being at [facility].     Exercises     Assessment/Plan    PT Assessment Patient needs continued PT services  PT Problem List Decreased strength;Decreased activity tolerance;Decreased balance;Decreased mobility;Decreased cognition;Decreased safety awareness       PT Treatment Interventions Gait training;Functional mobility training;Therapeutic activities;Therapeutic exercise;Balance training    PT Goals (Current goals can be found in the Care Plan section)  Acute Rehab PT Goals Patient Stated Goal: to return to [facility] (per wife) PT Goal Formulation: With family Time For Goal Achievement: 06/07/20 Potential to Achieve Goals: Fair    Frequency Min 2X/week   Barriers to discharge        Co-evaluation                AM-PAC PT "6 Clicks" Mobility  Outcome Measure Help needed turning from your back to your side while in a flat bed without using bedrails?: A Lot Help needed moving from lying on your back to sitting on the side of a flat bed without using bedrails?: A Lot Help needed moving to and from a bed to a chair (including a wheelchair)?: A Lot Help needed standing up from a chair using your arms (e.g., wheelchair or bedside chair)?: A Lot Help needed to walk in hospital room?: A Lot Help needed climbing 3-5 steps with a railing? : Total 6 Click Score: 11    End of Session Equipment Utilized During Treatment: Gait belt Activity Tolerance: Patient tolerated treatment well Patient left: in bed;with call bell/phone within reach;with bed alarm set;with family/visitor present;with restraints reapplied Nurse Communication: Mobility status PT Visit Diagnosis: Unsteadiness on feet (R26.81);Other abnormalities of gait and mobility (R26.89);Muscle weakness (generalized) (M62.81);Difficulty in walking, not elsewhere classified (R26.2)    Time: 2725-3664 PT Time Calculation (min) (ACUTE ONLY): 21 min   Charges:   PT Evaluation $PT Eval Moderate Complexity: 1 Mod          Perrin Maltese, PT, DPT Acute Rehabilitation Services Pager [phone] Office [phone]   Melene Plan Allred 05/24/2020, 5:11 PM

## 2020-05-24 NOTE — Progress Notes (Signed)
PROGRESS NOTE    Eric Larsen  IRC:[id number] DOB: 03-07-1931 DOA: 05/23/2020 PCP: [name], MD   Brief Narrative: 86 year old with past medical history significant for cognitive decline, unintentional weight loss approximately 50 pounds since February 2021, seizures, history of skin squamous cell carcinoma, osteopenia, former smoker who presents complaining of productive cough 1 day duration. No fevers. Patient admitted for infectious work-up due to elevated white blood count. Increased bilirubin.   Assessment & Plan:   Active Problems:   Primary hypothyroidism   Leukocytosis   Weight loss, unintentional   1-Leukocytosis: Unclear etiology, infectious work-up so far negative. Repeated chest x-ray after hydration negative. Follow urine culture, blood cultures. Blood cell trending down off of antibiotics. Continue to monitor.  2-Anxiety/agitation: Continue with Ativan.  3-Hypothyroidism: Continue with Synthroid. 4-Malnutrition moderate to severe ; Started on Ensure. 5-Hyperbilirubinemia; work up for hemolysis in process. B 12 normal, peripheral smear and Haptoglobin pending. Will check comb test  LDH normal.    Estimated body mass index is 17.43 kg/m as calculated from the following:   Height as of 03/30/20: 5\' 8"  (1.727 m).   Weight as of 03/30/20: 52 kg.   DVT prophylaxis: Lovenox Code Status: DNR Family Communication: Care discussed with wife who was at bedside Disposition Plan:  Status is: Inpatient  Remains inpatient appropriate because:Altered mental status and Ongoing diagnostic testing needed not appropriate for outpatient work up   Dispo: The patient is from: SNF              Anticipated d/c is to: SNF              Anticipated d/c date is: 1 day              Patient currently is not medically stable to d/c.        Consultants:   none  Procedures:   none  Antimicrobials:  none  Subjective: Alert, pleasantly confuse,  denies pain   Objective: Vitals:   05/24/20 0430 05/24/20 0453 05/24/20 1025 05/24/20 1424  BP: (!) 145/71 115/73 120/75 120/73  Pulse: (!) 104 96 79 97  Resp: 16  17 16   Temp: 98.4 F (36.9 C) 97.7 F (36.5 C) 98.6 F (37 C) 98.5 F (36.9 C)  TempSrc: Axillary Oral Oral Oral  SpO2: 95% 96% 95% 96%    Intake/Output Summary (Last 24 hours) at 05/24/2020 1602 Last data filed at 05/24/2020 1337 Gross per 24 hour  Intake 1710.13 ml  Output 450 ml  Net 1260.13 ml   There were no vitals filed for this visit.  Examination:  General exam: Appears calm and comfortable  Respiratory system: Clear to auscultation. Respiratory effort normal. Cardiovascular system: S1 & S2 heard, RRR. No JVD, murmurs, rubs, gallops or clicks. No pedal edema. Gastrointestinal system: Abdomen is nondistended, soft and nontender. No organomegaly or masses felt. Normal bowel sounds heard. Central nervous system: Alert  Extremities: no edema   Data Reviewed: I have personally reviewed following labs and imaging studies  CBC: Recent Labs  Lab 05/23/20 2025 05/24/20 0618  WBC 21.7* 13.2*  HGB 13.1 11.6*  HCT 39.9 34.3*  MCV 101.3* 100.9*  PLT 207 510   Basic Metabolic Panel: Recent Labs  Lab 05/23/20 2025 05/24/20 0618  NA 141 144  K 4.0 3.9  CL 106 108  CO2 24 29  GLUCOSE 149* 99  BUN 24* 21  CREATININE 0.91 0.83  CALCIUM 9.7 9.1   GFR: CrCl cannot be calculated (Unknown ideal weight.).  Liver Function Tests: Recent Labs  Lab 05/24/20 0011 05/24/20 0618 05/24/20 1046  AST 28 27 23   ALT 26 23 22   ALKPHOS 51 43 46  BILITOT 3.9* 3.1* 2.9*  PROT 6.5 5.7* 5.8*  ALBUMIN 3.6 3.1* 3.1*   No results for input(s): LIPASE, AMYLASE in the last 168 hours. No results for input(s): AMMONIA in the last 168 hours. Coagulation Profile: No results for input(s): INR, PROTIME in the last 168 hours. Cardiac Enzymes: No results for input(s): CKTOTAL, CKMB, CKMBINDEX, TROPONINI in the last 168  hours. BNP (last 3 results) No results for input(s): PROBNP in the last 8760 hours. HbA1C: No results for input(s): HGBA1C in the last 72 hours. CBG: No results for input(s): GLUCAP in the last 168 hours. Lipid Profile: No results for input(s): CHOL, HDL, LDLCALC, TRIG, CHOLHDL, LDLDIRECT in the last 72 hours. Thyroid Function Tests: No results for input(s): TSH, T4TOTAL, FREET4, T3FREE, THYROIDAB in the last 72 hours. Anemia Panel: Recent Labs    05/24/20 1046  VITAMINB12 425   Sepsis Labs: Recent Labs  Lab 05/24/20 0011  LATICACIDVEN 0.8    Recent Results (from the past 240 hour(s))  Respiratory Panel by RT PCR (Flu A&B, Covid) - Nasopharyngeal Swab     Status: None   Collection Time: 05/24/20 12:16 AM   Specimen: Nasopharyngeal Swab  Result Value Ref Range Status   SARS Coronavirus 2 by RT PCR NEGATIVE NEGATIVE Final    Comment: (NOTE) SARS-CoV-2 target nucleic acids are NOT DETECTED.  The SARS-CoV-2 RNA is generally detectable in upper respiratoy specimens during the acute phase of infection. The lowest concentration of SARS-CoV-2 viral copies this assay can detect is 131 copies/mL. A negative result does not preclude SARS-Cov-2 infection and should not be used as the sole basis for treatment or other patient management decisions. A negative result may occur with  improper specimen collection/handling, submission of specimen other than nasopharyngeal swab, presence of viral mutation(s) within the areas targeted by this assay, and inadequate number of viral copies (<131 copies/mL). A negative result must be combined with clinical observations, patient history, and epidemiological information. The expected result is Negative.  Fact Sheet for Patients:  PinkCheek.be  Fact Sheet for Healthcare Providers:  GravelBags.it  This test is no t yet approved or cleared by the [place] FDA and  has been  authorized for detection and/or diagnosis of SARS-CoV-2 by FDA under an Emergency Use Authorization (EUA). This EUA will remain  in effect (meaning this test can be used) for the duration of the COVID-19 declaration under Section 564(b)(1) of the Act, 21 U.S.C. section 360bbb-3(b)(1), unless the authorization is terminated or revoked sooner.     Influenza A by PCR NEGATIVE NEGATIVE Final   Influenza B by PCR NEGATIVE NEGATIVE Final    Comment: (NOTE) The Xpert Xpress SARS-CoV-2/FLU/RSV assay is intended as an aid in  the diagnosis of influenza from Nasopharyngeal swab specimens and  should not be used as a sole basis for treatment. Nasal washings and  aspirates are unacceptable for Xpert Xpress SARS-CoV-2/FLU/RSV  testing.  Fact Sheet for Patients: PinkCheek.be  Fact Sheet for Healthcare Providers: GravelBags.it  This test is not yet approved or cleared by the [place] FDA and  has been authorized for detection and/or diagnosis of SARS-CoV-2 by  FDA under an Emergency Use Authorization (EUA). This EUA will remain  in effect (meaning this test can be used) for the duration of the  Covid-19 declaration under Section 564(b)(1) of the  Act, 21  U.S.C. section 360bbb-3(b)(1), unless the authorization is  terminated or revoked. Performed at [facility], [place] [address] [place], [place] [address]          Radiology Studies: DG Chest 2 View  Result Date: 05/24/2020 CLINICAL DATA:  Cough. EXAM: CHEST - 2 VIEW COMPARISON:  May 23, 2020. FINDINGS: The heart size and mediastinal contours are within normal limits. Both lungs are clear. The visualized skeletal structures are unremarkable. IMPRESSION: No active cardiopulmonary disease. Aortic Atherosclerosis (ICD10-I70.0). Electronically Signed   By: Marijo Conception M.D.   On: 05/24/2020 08:38   DG Chest 2 View  Result Date: 05/23/2020 CLINICAL DATA:  Cough and  weakness EXAM: CHEST - 2 VIEW COMPARISON:  March 30, 2020 FINDINGS: The heart size and mediastinal contours are within normal limits. Both lungs are clear. Aortic knob calcifications are seen. There is healed rib fractures in the lower left chest. Prior fixation is seen at the left proximal humerus. IMPRESSION: No active cardiopulmonary disease. Electronically Signed   By: Prudencio Pair M.D.   On: 05/23/2020 20:48        Scheduled Meds: . divalproex  125 mg Oral TID  . enoxaparin (LOVENOX) injection  40 mg Subcutaneous Daily  . feeding supplement (ENSURE ENLIVE)  237 mL Oral BID BM  . folic acid  1 mg Oral Daily  . levETIRAcetam  500 mg Oral BID  . levothyroxine  125 mcg Oral Q0600  . melatonin  3 mg Oral QHS  . multivitamin with minerals  1 tablet Oral Daily  . nystatin  5 mL Oral QID   Continuous Infusions: . lactated ringers 100 mL/hr at 05/24/20 0851     LOS: 0 days    Time spent: 35 minutes    Avigdor Dollar A Tramya Schoenfelder, MD [facility]   If 7PM-7AM, please contact night-coverage [email or web address]  05/24/2020, 4:02 PM

## 2020-05-24 NOTE — ED Notes (Signed)
Attempted to call report. Unable to reach RN at this time. Will reattempt in 25mins.

## 2020-05-25 DIAGNOSIS — E43 Unspecified severe protein-calorie malnutrition: Secondary | ICD-10-CM | POA: Insufficient documentation

## 2020-05-25 LAB — CBC
HCT: 32.7 % — ABNORMAL LOW (ref 39.0–52.0)
Hemoglobin: 10.9 g/dL — ABNORMAL LOW (ref 13.0–17.0)
MCH: 33.9 pg (ref 26.0–34.0)
MCHC: 33.3 g/dL (ref 30.0–36.0)
MCV: 101.6 fL — ABNORMAL HIGH (ref 80.0–100.0)
Platelets: 171 10*3/uL (ref 150–400)
RBC: 3.22 MIL/uL — ABNORMAL LOW (ref 4.22–5.81)
RDW: 21.4 % — ABNORMAL HIGH (ref 11.5–15.5)
WBC: 8.9 10*3/uL (ref 4.0–10.5)
nRBC: 0 % (ref 0.0–0.2)

## 2020-05-25 LAB — COMPREHENSIVE METABOLIC PANEL
ALT: 19 U/L (ref 0–44)
AST: 23 U/L (ref 15–41)
Albumin: 2.8 g/dL — ABNORMAL LOW (ref 3.5–5.0)
Alkaline Phosphatase: 37 U/L — ABNORMAL LOW (ref 38–126)
Anion gap: 6 (ref 5–15)
BUN: 18 mg/dL (ref 8–23)
CO2: 29 mmol/L (ref 22–32)
Calcium: 8.9 mg/dL (ref 8.9–10.3)
Chloride: 106 mmol/L (ref 98–111)
Creatinine, Ser: 0.82 mg/dL (ref 0.61–1.24)
GFR, Estimated: >60 mL/min (ref >60)
Glucose, Bld: 140 mg/dL — ABNORMAL HIGH (ref 70–99)
Potassium: 4 mmol/L (ref 3.5–5.1)
Sodium: 141 mmol/L (ref 135–145)
Total Bilirubin: 2.1 mg/dL — ABNORMAL HIGH (ref 0.3–1.2)
Total Protein: 5.3 g/dL — ABNORMAL LOW (ref 6.5–8.1)

## 2020-05-25 LAB — URINE CULTURE: Culture: NO GROWTH

## 2020-05-25 LAB — DIRECT ANTIGLOBULIN TEST (NOT AT ARMC)
DAT, IgG: NEGATIVE
DAT, complement: NEGATIVE

## 2020-05-25 MED ORDER — NYSTATIN 100000 UNIT/ML MT SUSP: 5.0000 mL | Freq: Four times a day (QID) | OROMUCOSAL | 0 refills | Status: DC

## 2020-05-25 MED ORDER — LORAZEPAM 0.5 MG PO TABS
0.2500 mg | ORAL_TABLET | Freq: Three times a day (TID) | ORAL | 0 refills | Status: DC | PRN
Start: 2020-05-25 — End: 2020-08-03

## 2020-05-25 MED ORDER — ENSURE ENLIVE PO LIQD: 237.0000 mL | Freq: Three times a day (TID) | ORAL | 12 refills | Status: DC

## 2020-05-25 NOTE — Evaluation (Addendum)
Occupational Therapy Evaluation Patient Details Name: Eric Larsen MRN: [id number] DOB: 1931-04-20 Today's Date: 05/25/2020    History of Present Illness Eric Larsen is a 86 y.o. male with medical history significant for cognitive decline, unintentional weight loss of approximately 50 lbs since February 2021, seizure in February 2021 on depakote and keppra, history of squamous cell carcinoma, osteopenia, former tobacco user (quit approximatley 22 yrs ago). Per spouse at bedside, Eric Larsen has been coughing up white sputum x 1 day. She brought him in because she states 'he has a tendency to get pneumonia'   Clinical Impression   This 86 y/o male presents with the above. PTA pt residing in memory care unit and using RW for mobility (per chart). Pt very pleasant and willing to participate in therapy session today. Overall pt requiring minA for mobility using RW today, tolerating x2 bouts of standing activity. Initially completing standing and taking steps at bedside; during second round pt able to mobilize around foot of bed to other side/EOB. He requires up to maxA for LB ADL. Pt following commands well given short simple instrcution, ocasionally requiring multimodal cueing. Pt to benefit from continued acute OT services and recommend he return to previous facility/memory care unit with follow up OT services.    Follow Up Recommendations  SNF (return to previous memory care unit )    Equipment Recommendations  Other (comment) (defer to next venue)           Precautions / Restrictions Precautions Precautions: Fall Restrictions Weight Bearing Restrictions: No      Mobility Bed Mobility Overal bed mobility: Needs Assistance Bed Mobility: Supine to Sit;Sit to Supine     Supine to sit: Mod assist Sit to supine: Mod assist   General bed mobility comments: verbal and tactile cues for sequencing, assist for LEs   Transfers Overall transfer level: Needs  assistance Equipment used: Rolling walker (2 wheeled) Transfers: Sit to/from Stand Sit to Stand: Min assist         General transfer comment: pt with good use of UEs to push to standing, requires steadying assist once upright as pt lists posteriorly, requiring cues and assist for anterior wt shift     Balance Overall balance assessment: Needs assistance Sitting-balance support: Feet supported;Bilateral upper extremity supported Sitting balance-[name]: Poor Sitting balance - Comments: able to maintain sitting without external assist but often with posterior sway/LOB Postural control: Posterior lean Standing balance support: Bilateral upper extremity supported Standing balance-[name]: Poor Standing balance comment: reliant on UE support/external assist                            ADL either performed or assessed with clinical judgement   ADL Overall ADL's : Needs assistance/impaired Eating/Feeding: Set up;Sitting   Grooming: Min guard;Sitting   Upper Body Bathing: Minimal assistance;Sitting   Lower Body Bathing: Moderate assistance;Sit to/from stand   Upper Body Dressing : Minimal assistance;Sitting   Lower Body Dressing: Sit to/from stand;Maximal assistance Lower Body Dressing Details (indicate cue type and reason): assist to don shoes  Toilet Transfer: Minimal assistance;Ambulation;RW Toilet Transfer Details (indicate cue type and reason): simulated via transfer to/from EOB Toileting- Clothing Manipulation and Hygiene: Maximal assistance;Sit to/from stand       Functional mobility during ADLs: Minimal assistance;Rolling walker       Vision         Perception     Praxis      Pertinent Vitals/Pain Pain  Assessment: No/denies pain     Hand Dominance     Extremity/Trunk Assessment Upper Extremity Assessment Upper Extremity Assessment: Overall WFL for tasks assessed   Lower Extremity Assessment Lower Extremity Assessment: Defer to PT  evaluation   Cervical / Trunk Assessment Cervical / Trunk Assessment: Kyphotic   Communication Communication Communication: No difficulties   Cognition Arousal/Alertness: Awake/alert Behavior During Therapy: WFL for tasks assessed/performed Overall Cognitive Status: History of cognitive impairments - at baseline                                 General Comments: pt with hx of dementia, pleasant today and follow simple commands, occasionally requiring multimodal cues but often does well with simple step by step cueing    General Comments       Exercises     Shoulder Instructions      Home Living Family/patient expects to be discharged to:: Skilled nursing facility (Memory care unit at [facility])                                        Prior Functioning/Environment Level of Independence: Needs assistance  Gait / Transfers Assistance Needed: [name] states he was able to ambulate with rollator for household/facility distances but used RW for community distances.  ADL's / Homemaking Assistance Needed: Requires assistance for bathing and dressing, locating dining room at [facility]   Comments: PLOF obtained from PT eval who obtained report from pt spouse         OT Problem List: Decreased strength;Decreased range of motion;Decreased activity tolerance;Impaired balance (sitting and/or standing);Decreased knowledge of use of DME or AE;Decreased knowledge of precautions;Decreased cognition      OT Treatment/Interventions: Self-care/ADL training;Therapeutic exercise;Energy conservation;DME and/or AE instruction;Therapeutic activities;Cognitive remediation/compensation;Patient/family education;Balance training    OT Goals(Current goals can be found in the care plan section) Acute Rehab OT Goals Patient Stated Goal: to return to [facility] (per wife) OT Goal Formulation: With patient/family Time For Goal Achievement: 06/08/20 Potential to Achieve Goals: Good   OT Frequency: Min 2X/week   Barriers to D/C:            Co-evaluation              AM-PAC OT "6 Clicks" Daily Activity     Outcome Measure Help from another person eating meals?: A Little Help from another person taking care of personal grooming?: A Little Help from another person toileting, which includes using toliet, bedpan, or urinal?: A Lot Help from another person bathing (including washing, rinsing, drying)?: A Lot Help from another person to put on and taking off regular upper body clothing?: A Lot Help from another person to put on and taking off regular lower body clothing?: A Lot 6 Click Score: 14   End of Session Equipment Utilized During Treatment: Gait belt;Rolling walker Nurse Communication: Mobility status  Activity Tolerance: Patient tolerated treatment well Patient left: in bed;with call bell/phone within reach;with bed alarm set  OT Visit Diagnosis: Muscle weakness (generalized) (M62.81);Other symptoms and signs involving cognitive function;Unsteadiness on feet (R26.81)                Time: 1120-1140 OT Time Calculation (min): 20 min Charges:  OT General Charges $OT Visit: 1 Visit OT Evaluation $OT Eval Moderate Complexity: [place], OT Acute Rehabilitation Services Pager [phone]  Office [phone]   Eric Larsen 05/25/2020, 1:24 PM

## 2020-05-25 NOTE — Discharge Summary (Addendum)
Physician Discharge Summary  Eric Larsen CXK:[id number] DOB: 04/25/1931 DOA: 05/23/2020  PCP: [name], MD  Admit date: 05/23/2020 Discharge date: 05/25/2020  Admitted From: SNF Disposition:  ALF memory care unit.   Recommendations for Outpatient Follow-up:  1. Follow up with PCP in 1-2 weeks 2. Please obtain BMP/CBC in one week 3. Please follow up on the following pending results: Haptoglobin level 4. Needs referral to hematologist, for , macrocytosis, increase indirect bilirubin  level.  5. Need to be follow by palliative care.     Discharge Condition: Stable.  CODE STATUS: DNR Diet recommendation: Regular Diet.   Brief/Interim Summary: 86 year old with past medical history significant for cognitive decline, unintentional weight loss approximately 50 pounds since February 2021, seizures, history of skin squamous cell carcinoma, osteopenia, former smoker who presents complaining of productive cough 1 day duration. No fevers. Patient admitted for infectious work-up due to elevated white blood count. Increased bilirubin.  1-Leukocytosis: Unclear etiology, infectious work-up so far negative. Repeated chest x-ray after hydration negative. Urine culture: no growth., blood cultures: no growth to date.  Normalized, off antibiotics.    2-Anxiety/agitation: Continue with Ativan.  3-Hypothyroidism: Continue with Synthroid. 4-Malnutrition moderate to severe ; Continue with  Ensure. 5-Hyperbilirubinemia; Anemia Macrocytic anemia.  -Hb is stable compare to previous level 3 months ago at 10. He probably had  hemoconcentration, dehydrated on admission.  - B 12 normal, peripheral smear: discussed with pathologist, no evidence of hemolysis or significant abnormalities.  - Haptoglobin pending. Comb test negative.  -LDH normal.  -Bilirubin trending down.    Discharge Diagnoses:  Active Problems:   Primary hypothyroidism   Leukocytosis   Weight loss,  unintentional   Protein-calorie malnutrition, severe    Discharge Instructions  Discharge Instructions    Diet - low sodium heart healthy   Complete by: As directed    Increase activity slowly   Complete by: As directed      Allergies as of 05/25/2020      Reactions   Pollen Extract Itching, Other (See Comments)   On MAR- Itchy eyes, runny nose, nasal congestion      Medication List    STOP taking these medications   fluticasone 50 MCG/ACT nasal spray Commonly known as: FLONASE     TAKE these medications   acetaminophen 325 MG tablet Commonly known as: TYLENOL Take 325 mg by mouth every 6 (six) hours as needed (for pain or fever).   divalproex 125 MG capsule Commonly known as: DEPAKOTE SPRINKLE Take 125-250 mg by mouth See admin instructions. Take 250 mg by mouth in the morning, 125 mg at 2 PM, and 250 mg at bedtime   feeding supplement (ENSURE ENLIVE) Liqd Take 237 mLs by mouth 3 (three) times daily between meals.   folic acid 1 MG tablet Commonly known as: FOLVITE Take 1 mg by mouth daily.   ibandronate 150 MG tablet Commonly known as: BONIVA Take 150 mg by mouth every 30 (thirty) days. Take in the morning with a full glass of water, on an empty stomach, and do not take anything else by mouth or lie down for the next 30 min.   levETIRAcetam 100 MG/ML solution Commonly known as: KEPPRA Take 500 mg by mouth 2 (two) times daily.   levothyroxine 125 MCG tablet Commonly known as: SYNTHROID Take 1 tablet (125 mcg total) by mouth daily at 6 (six) AM.   lidocaine 5 % Commonly known as: LIDODERM Place 1 patch onto the skin daily. Remove & Discard patch  within 12 hours or as directed by MD What changed:   when to take this  reasons to take this  additional instructions   Life Pack Mens Misc Take 1 each by mouth See admin instructions. Mix 1 packet into 8 ounces of water and drink once daily at 2 PM   LORazepam 0.5 MG tablet Commonly known as: ATIVAN Take  0.5 tablets (0.25 mg total) by mouth every 8 (eight) hours as needed (for agitation).   melatonin 3 MG Tabs tablet Take 1 tablet (3 mg total) by mouth at bedtime.   nystatin 100000 UNIT/ML suspension Commonly known as: MYCOSTATIN Take 5 mLs (500,000 Units total) by mouth 4 (four) times daily.       Allergies  Allergen Reactions  . Pollen Extract Itching and Other (See Comments)    On MAR- Itchy eyes, runny nose, nasal congestion    Consultations:  NOne   Procedures/Studies: DG Chest 2 View  Result Date: 05/24/2020 CLINICAL DATA:  Cough. EXAM: CHEST - 2 VIEW COMPARISON:  May 23, 2020. FINDINGS: The heart size and mediastinal contours are within normal limits. Both lungs are clear. The visualized skeletal structures are unremarkable. IMPRESSION: No active cardiopulmonary disease. Aortic Atherosclerosis (ICD10-I70.0). Electronically Signed   By: Marijo Conception M.D.   On: 05/24/2020 08:38   DG Chest 2 View  Result Date: 05/23/2020 CLINICAL DATA:  Cough and weakness EXAM: CHEST - 2 VIEW COMPARISON:  March 30, 2020 FINDINGS: The heart size and mediastinal contours are within normal limits. Both lungs are clear. Aortic knob calcifications are seen. There is healed rib fractures in the lower left chest. Prior fixation is seen at the left proximal humerus. IMPRESSION: No active cardiopulmonary disease. Electronically Signed   By: Prudencio Pair M.D.   On: 05/23/2020 20:48     Subjective: He is feeling well, pleasantly confuse.   Discharge Exam: Vitals:   05/25/20 0417 05/25/20 1027  BP: 127/72 136/86  Pulse: 72 76  Resp:  18  Temp: 97.7 F (36.5 C) 97.9 F (36.6 C)  SpO2: 98%      General: Pt is alert, awake, not in acute distress Cardiovascular: RRR, S1/S2 +, no rubs, no gallops Respiratory: CTA bilaterally, no wheezing, no rhonchi Abdominal: Soft, NT, ND, bowel sounds + Extremities: no edema, no cyanosis    The results of significant diagnostics from this  hospitalization (including imaging, microbiology, ancillary and laboratory) are listed below for reference.     Microbiology: Recent Results (from the past 240 hour(s))  Culture, blood (Routine X 2) w Reflex to ID Panel     Status: None (Preliminary result)   Collection Time: 05/24/20 12:11 AM   Specimen: BLOOD RIGHT HAND  Result Value Ref Range Status   Specimen Description BLOOD RIGHT HAND  Final   Special Requests   Final    BOTTLES DRAWN AEROBIC AND ANAEROBIC Blood Culture adequate volume   Culture   Final    NO GROWTH 1 DAY Performed at [facility], [place] [address] [place], [place] [address]    Report Status PENDING  Incomplete  Culture, blood (Routine X 2) w Reflex to ID Panel     Status: None (Preliminary result)   Collection Time: 05/24/20 12:11 AM   Specimen: BLOOD  Result Value Ref Range Status   Specimen Description BLOOD RIGHT ANTECUBITAL  Final   Special Requests   Final    BOTTLES DRAWN AEROBIC AND ANAEROBIC Blood Culture adequate volume   Culture  Final    NO GROWTH 1 DAY Performed at [facility], [place] [address], [place], [place] [address]    Report Status PENDING  Incomplete  Respiratory Panel by RT PCR (Flu A&B, Covid) - Nasopharyngeal Swab     Status: None   Collection Time: 05/24/20 12:16 AM   Specimen: Nasopharyngeal Swab  Result Value Ref Range Status   SARS Coronavirus 2 by RT PCR NEGATIVE NEGATIVE Final    Comment: (NOTE) SARS-CoV-2 target nucleic acids are NOT DETECTED.  The SARS-CoV-2 RNA is generally detectable in upper respiratoy specimens during the acute phase of infection. The lowest concentration of SARS-CoV-2 viral copies this assay can detect is 131 copies/mL. A negative result does not preclude SARS-Cov-2 infection and should not be used as the sole basis for treatment or other patient management decisions. A negative result may occur with  improper specimen collection/handling, submission of specimen other than  nasopharyngeal swab, presence of viral mutation(s) within the areas targeted by this assay, and inadequate number of viral copies (<131 copies/mL). A negative result must be combined with clinical observations, patient history, and epidemiological information. The expected result is Negative.  Fact Sheet for Patients:  PinkCheek.be  Fact Sheet for Healthcare Providers:  GravelBags.it  This test is no t yet approved or cleared by the [place] FDA and  has been authorized for detection and/or diagnosis of SARS-CoV-2 by FDA under an Emergency Use Authorization (EUA). This EUA will remain  in effect (meaning this test can be used) for the duration of the COVID-19 declaration under Section 564(b)(1) of the Act, 21 U.S.C. section 360bbb-3(b)(1), unless the authorization is terminated or revoked sooner.     Influenza A by PCR NEGATIVE NEGATIVE Final   Influenza B by PCR NEGATIVE NEGATIVE Final    Comment: (NOTE) The Xpert Xpress SARS-CoV-2/FLU/RSV assay is intended as an aid in  the diagnosis of influenza from Nasopharyngeal swab specimens and  should not be used as a sole basis for treatment. Nasal washings and  aspirates are unacceptable for Xpert Xpress SARS-CoV-2/FLU/RSV  testing.  Fact Sheet for Patients: PinkCheek.be  Fact Sheet for Healthcare Providers: GravelBags.it  This test is not yet approved or cleared by the [place] FDA and  has been authorized for detection and/or diagnosis of SARS-CoV-2 by  FDA under an Emergency Use Authorization (EUA). This EUA will remain  in effect (meaning this test can be used) for the duration of the  Covid-19 declaration under Section 564(b)(1) of the Act, 21  U.S.C. section 360bbb-3(b)(1), unless the authorization is  terminated or revoked. Performed at [facility], [place] [address] [place],  [place] [address]   Urine Culture     Status: None   Collection Time: 05/24/20  2:22 AM   Specimen: Urine, Random  Result Value Ref Range Status   Specimen Description URINE, RANDOM  Final   Special Requests NONE  Final   Culture   Final    NO GROWTH Performed at [facility], [place] [address], [place], [place] [address]    Report Status 05/25/2020 FINAL  Final     Labs: BNP (last 3 results) No results for input(s): BNP in the last 8760 hours. Basic Metabolic Panel: Recent Labs  Lab 05/23/20 2025 05/24/20 0618 05/25/20 0222  NA 141 144 141  K 4.0 3.9 4.0  CL 106 108 106  CO2 24 29 29   GLUCOSE 149* 99 140*  BUN 24* 21 18  CREATININE 0.91 0.83  0.82  CALCIUM 9.7 9.1 8.9   Liver Function Tests: Recent Labs  Lab 05/24/20 0011 05/24/20 0618 05/24/20 1046 05/25/20 0222  AST 28 27 23 23   ALT 26 23 22 19   ALKPHOS 51 43 46 37*  BILITOT 3.9* 3.1* 2.9* 2.1*  PROT 6.5 5.7* 5.8* 5.3*  ALBUMIN 3.6 3.1* 3.1* 2.8*   No results for input(s): LIPASE, AMYLASE in the last 168 hours. No results for input(s): AMMONIA in the last 168 hours. CBC: Recent Labs  Lab 05/23/20 2025 05/24/20 0618 05/25/20 0222  WBC 21.7* 13.2* 8.9  HGB 13.1 11.6* 10.9*  HCT 39.9 34.3* 32.7*  MCV 101.3* 100.9* 101.6*  PLT 207 161 171   Cardiac Enzymes: No results for input(s): CKTOTAL, CKMB, CKMBINDEX, TROPONINI in the last 168 hours. BNP: Invalid input(s): POCBNP CBG: No results for input(s): GLUCAP in the last 168 hours. D-Dimer No results for input(s): DDIMER in the last 72 hours. Hgb A1c No results for input(s): HGBA1C in the last 72 hours. Lipid Profile No results for input(s): CHOL, HDL, LDLCALC, TRIG, CHOLHDL, LDLDIRECT in the last 72 hours. Thyroid function studies No results for input(s): TSH, T4TOTAL, T3FREE, THYROIDAB in the last 72 hours.  Invalid input(s): FREET3 Anemia work up Recent Labs    05/24/20 1046  VITAMINB12 425   Urinalysis    Component Value Date/Time    COLORURINE AMBER (A) 05/24/2020 0222   APPEARANCEUR HAZY (A) 05/24/2020 0222   LABSPEC 1.017 05/24/2020 0222   PHURINE 7.0 05/24/2020 0222   GLUCOSEU NEGATIVE 05/24/2020 0222   HGBUR NEGATIVE 05/24/2020 0222   BILIRUBINUR NEGATIVE 05/24/2020 0222   KETONESUR NEGATIVE 05/24/2020 0222   PROTEINUR NEGATIVE 05/24/2020 0222   NITRITE NEGATIVE 05/24/2020 0222   LEUKOCYTESUR NEGATIVE 05/24/2020 0222   Sepsis Labs Invalid input(s): PROCALCITONIN,  WBC,  LACTICIDVEN Microbiology Recent Results (from the past 240 hour(s))  Culture, blood (Routine X 2) w Reflex to ID Panel     Status: None (Preliminary result)   Collection Time: 05/24/20 12:11 AM   Specimen: BLOOD RIGHT HAND  Result Value Ref Range Status   Specimen Description BLOOD RIGHT HAND  Final   Special Requests   Final    BOTTLES DRAWN AEROBIC AND ANAEROBIC Blood Culture adequate volume   Culture   Final    NO GROWTH 1 DAY Performed at [facility], [place] [address] [place], [place] [address]    Report Status PENDING  Incomplete  Culture, blood (Routine X 2) w Reflex to ID Panel     Status: None (Preliminary result)   Collection Time: 05/24/20 12:11 AM   Specimen: BLOOD  Result Value Ref Range Status   Specimen Description BLOOD RIGHT ANTECUBITAL  Final   Special Requests   Final    BOTTLES DRAWN AEROBIC AND ANAEROBIC Blood Culture adequate volume   Culture   Final    NO GROWTH 1 DAY Performed at [facility], [place] [address] [place], [place] [address]    Report Status PENDING  Incomplete  Respiratory Panel by RT PCR (Flu A&B, Covid) - Nasopharyngeal Swab     Status: None   Collection Time: 05/24/20 12:16 AM   Specimen: Nasopharyngeal Swab  Result Value Ref Range Status   SARS Coronavirus 2 by RT PCR NEGATIVE NEGATIVE Final    Comment: (NOTE) SARS-CoV-2 target nucleic acids are NOT DETECTED.  The SARS-CoV-2 RNA is generally detectable in upper respiratoy specimens during the acute phase of infection. The  lowest concentration of SARS-CoV-2  viral copies this assay can detect is 131 copies/mL. A negative result does not preclude SARS-Cov-2 infection and should not be used as the sole basis for treatment or other patient management decisions. A negative result may occur with  improper specimen collection/handling, submission of specimen other than nasopharyngeal swab, presence of viral mutation(s) within the areas targeted by this assay, and inadequate number of viral copies (<131 copies/mL). A negative result must be combined with clinical observations, patient history, and epidemiological information. The expected result is Negative.  Fact Sheet for Patients:  PinkCheek.be  Fact Sheet for Healthcare Providers:  GravelBags.it  This test is no t yet approved or cleared by the [place] FDA and  has been authorized for detection and/or diagnosis of SARS-CoV-2 by FDA under an Emergency Use Authorization (EUA). This EUA will remain  in effect (meaning this test can be used) for the duration of the COVID-19 declaration under Section 564(b)(1) of the Act, 21 U.S.C. section 360bbb-3(b)(1), unless the authorization is terminated or revoked sooner.     Influenza A by PCR NEGATIVE NEGATIVE Final   Influenza B by PCR NEGATIVE NEGATIVE Final    Comment: (NOTE) The Xpert Xpress SARS-CoV-2/FLU/RSV assay is intended as an aid in  the diagnosis of influenza from Nasopharyngeal swab specimens and  should not be used as a sole basis for treatment. Nasal washings and  aspirates are unacceptable for Xpert Xpress SARS-CoV-2/FLU/RSV  testing.  Fact Sheet for Patients: PinkCheek.be  Fact Sheet for Healthcare Providers: GravelBags.it  This test is not yet approved or cleared by the [place] FDA and  has been authorized for detection and/or diagnosis of SARS-CoV-2 by  FDA under  an Emergency Use Authorization (EUA). This EUA will remain  in effect (meaning this test can be used) for the duration of the  Covid-19 declaration under Section 564(b)(1) of the Act, 21  U.S.C. section 360bbb-3(b)(1), unless the authorization is  terminated or revoked. Performed at [facility], [place] [address], [place], [place] [address]   Urine Culture     Status: None   Collection Time: 05/24/20  2:22 AM   Specimen: Urine, Random  Result Value Ref Range Status   Specimen Description URINE, RANDOM  Final   Special Requests NONE  Final   Culture   Final    NO GROWTH Performed at [facility], [place] [address], [place], [place] [address]    Report Status 05/25/2020 FINAL  Final     Time coordinating discharge: 40 minutes  SIGNED:   Elmarie Shiley, MD  [facility]

## 2020-05-25 NOTE — Plan of Care (Signed)
  Problem: Safety: Goal: Ability to remain free from injury will improve Outcome: Progressing   

## 2020-05-25 NOTE — TOC Initial Note (Addendum)
Transition of Care [facility]) - Initial/Assessment Note    Patient Details  Name: Eric Larsen MRN: [id number] Date of Birth: 03-01-1931  [facility] of [facility]) CM/SW Contact:    Elliot Gurney [place], [place] Phone Number: [phone] 05/25/2020, 12:57 PM  Clinical Narrative:                 Patient to discharge back to [facility] care Unit. This Education officer, museum spoke with patient's spouse who agreed to transport patient back to the facility. Per patient's spouse, she felt that she could transport him back as she was the one that brought him to the hospital. Patient's spouse, declined ambulance transport.   Patient's spouse verbalized no  no additional transition of care needs at this time.   4:00PM FL2 and updated discharge summary faxed to intake coordinator at [facility]F-memory care unit.  [name] [address] LCSW Transition of Care [phone]     Expected Discharge Plan: Memory Care ([facility]) Barriers to Discharge: No Barriers Identified   Patient Goals and CMS Choice Patient states their goals for this hospitalization and ongoing recovery are:: patient has Dementia, per wife, she is wanting him to eat more      Expected Discharge Plan and Services Expected Discharge Plan: Memory Care ([facility])       Living arrangements for the past 2 months: [place] Expected Discharge Date: 05/25/20                                    Prior Living Arrangements/Services Living arrangements for the past 2 months: [place]                     Activities of Daily Living      Permission Sought/Granted                  Emotional Assessment              Admission diagnosis:  Leukocytosis [D72.829] Patient Active Problem List   Diagnosis Date Noted  . Protein-calorie malnutrition, severe 05/25/2020  . Leukocytosis 05/24/2020  . Weight loss, unintentional 05/24/2020  . Abnormality of gait  01/01/2020  . Hypokalemia   . Sleep disturbance   . Dementia associated with other underlying disease without behavioral disturbance ([place])   . Palliative care encounter   . Lethargy   . Acute blood loss anemia   . Hypoalbuminemia due to protein-calorie malnutrition ([place])   . Microcytic anemia   . Dysphagia   . Encephalitis 10/18/2019  . Abnormal EEG   . Hypothyroidism   . Macrocytic anemia   . 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 Hip fracture ([place]) 08/12/2017  . Hallux valgus of left foot 04/28/2017  . Hammer toe of left foot 04/28/2017  . Corn of toe 04/28/2017  . History of total left hip replacement 09/19/2015  . Spherocytosis (familial) ([place]) 09/17/2015  . Mixed hyperlipidemia 06/19/2015  . Benign prostatic hyperplasia with lower urinary tract symptoms 06/19/2015  . History of fracture of vertebra 11/06/2014  . History of colon polyps 08/08/2014  . Pathological fracture 11/01/2013  . Toenail fungus 01/20/2013  . Vitamin D deficiency 09/27/2012  . Primary hypothyroidism 09/27/2012  . Osteoporosis 09/27/2012  . Spherocytosis ([place]) 12/04/2011  . Skin cancer 12/04/2011   PCP:  [name], MD Pharmacy:   [facility] [place], [place] -  [place] AT [place] [address] [place] [address] Phone: [phone] Fax: [place], [place] [address] [place] [address] Phone: [phone] Fax: [phone]     Social Determinants of Health (SDOH) Interventions    Readmission Risk Interventions No flowsheet data found.

## 2020-05-25 NOTE — Progress Notes (Signed)
DISCHARGE NOTE SNF [facility] Aas to be discharged to [facility] per MD order. Patient verbalized understanding.  Skin clean, dry and intact without evidence of skin break down, no evidence of skin tears noted. IV catheter discontinued intact. Site without signs and symptoms of complications. Dressing and pressure applied. Pt denies pain at the site currently. No complaints noted.  Patient free of lines, drains, and wounds.   Discharge packet assembled. An After Visit Summary (AVS) was printed and given to pt wife [name]). Patient escorted via wheelchair to wife car and discharged to designated [facility] via his wife([name]). Report called to accepting facility; all questions and concerns addressed.   Yuvaan Olander S Terrie Grajales, RN _______________________________________________________________________

## 2020-05-25 NOTE — NC FL2 (Signed)
[place] LEVEL OF CARE SCREENING TOOL     IDENTIFICATION  Patient Name: Eric Larsen Birthdate: [birth year] Sex: male Admission Date (Current Location): 05/23/2020  [facility] [place] Number:  Herbalist and Address:  [facility] [place], [place] [address] [place], [place] [address]      Provider Number: [id number]  Attending Physician Name and Address:  No att. providers found  Relative Name and Phone Number:       Current Level of Care: Hospital Recommended Level of Care: [place] Prior Approval Number:    Date Approved/Denied:   PASRR Number:    Discharge Plan: Other (Comment) (ALF)    Current Diagnoses: Patient Active Problem List   Diagnosis Date Noted  . Protein-calorie malnutrition, severe 05/25/2020  . Leukocytosis 05/24/2020  . Weight loss, unintentional 05/24/2020  . Abnormality of gait 01/01/2020  . Hypokalemia   . Sleep disturbance   . Dementia associated with other underlying disease without behavioral disturbance ([place])   . Palliative care encounter   . Lethargy   . Acute blood loss anemia   . Hypoalbuminemia due to protein-calorie malnutrition ([place])   . Microcytic anemia   . Dysphagia   . Encephalitis 10/18/2019  . Abnormal EEG   . Hypothyroidism   . Macrocytic anemia   . 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 Hip fracture ([place]) 08/12/2017  . Hallux valgus of left foot 04/28/2017  . Hammer toe of left foot 04/28/2017  . Corn of toe 04/28/2017  . History of total left hip replacement 09/19/2015  . Spherocytosis (familial) ([place]) 09/17/2015  . Mixed hyperlipidemia 06/19/2015  . Benign prostatic hyperplasia with lower urinary tract symptoms 06/19/2015  . History of fracture of vertebra 11/06/2014  . History of colon polyps 08/08/2014  . Pathological fracture 11/01/2013  . Toenail fungus 01/20/2013  . Vitamin D deficiency 09/27/2012  . Primary hypothyroidism  09/27/2012  . Osteoporosis 09/27/2012  . Spherocytosis ([place]) 12/04/2011  . Skin cancer 12/04/2011    Orientation RESPIRATION BLADDER Height & Weight     Self  Normal Incontinent Weight: 94 lb 2.2 oz (42.7 kg) Height:     BEHAVIORAL SYMPTOMS/MOOD NEUROLOGICAL BOWEL NUTRITION STATUS      Incontinent Diet (See discharge summary)  AMBULATORY STATUS COMMUNICATION OF NEEDS Skin   Limited Assist Verbally Skin abrasions                       Personal Care Assistance Level of Assistance  Bathing, Feeding, Dressing Bathing Assistance: Limited assistance Feeding assistance: Independent Dressing Assistance: Limited assistance     Functional Limitations Info             SPECIAL CARE FACTORS FREQUENCY                       Contractures Contractures Info: Not present    Additional Factors Info  Code Status, Allergies, Psychotropic Code Status Info: DNR Allergies Info: Pollen extract Psychotropic Info: Ativan         Current Medications (05/25/2020):  This is the current hospital active medication list Current Facility-Administered Medications  Medication Dose Route Frequency Provider Last Rate Last Admin  . divalproex (DEPAKOTE SPRINKLE) capsule 125 mg  125 mg Oral TID Cox, Amy N, DO   125 mg at 05/25/20 1103  . enoxaparin (LOVENOX) injection 40 mg  40 mg Subcutaneous Daily Cox, Amy N, DO   40 mg  at 05/25/20 1103  . feeding supplement (ENSURE ENLIVE) (ENSURE ENLIVE) liquid 237 mL  237 mL Oral TID BM Regalado, Belkys A, MD   237 mL at 05/25/20 1103  . folic acid (FOLVITE) tablet 1 mg  1 mg Oral Daily Cox, Amy N, DO   1 mg at 05/25/20 1102  . levETIRAcetam (KEPPRA) tablet 500 mg  500 mg Oral BID Cox, Amy N, DO   500 mg at 05/25/20 1102  . levothyroxine (SYNTHROID) tablet 125 mcg  125 mcg Oral Q0600 Cox, Amy N, DO   125 mcg at 05/25/20 6761  . LORazepam (ATIVAN) tablet 0.25 mg  0.25 mg Oral Q8H PRN Cox, Amy N, DO      . melatonin tablet 3 mg  3 mg Oral QHS Cox, Amy N,  DO   3 mg at 05/24/20 2217  . multivitamin with minerals tablet 1 tablet  1 tablet Oral Daily Cox, Amy N, DO   1 tablet at 05/25/20 1102  . nystatin (MYCOSTATIN) 100000 UNIT/ML suspension 500,000 Units  5 mL Oral QID Regalado, Belkys A, MD   500,000 Units at 05/25/20 1102   Current Outpatient Medications  Medication Sig Dispense Refill  . acetaminophen (TYLENOL) 325 MG tablet Take 325 mg by mouth every 6 (six) hours as needed (for pain or fever).     . divalproex (DEPAKOTE SPRINKLE) 125 MG capsule Take 125-250 mg by mouth See admin instructions. Take 250 mg by mouth in the morning, 125 mg at 2 PM, and 250 mg at bedtime    . folic acid (FOLVITE) 1 MG tablet Take 1 mg by mouth daily.    Marland Kitchen ibandronate (BONIVA) 150 MG tablet Take 150 mg by mouth every 30 (thirty) days. Take in the morning with a full glass of water, on an empty stomach, and do not take anything else by mouth or lie down for the next 30 min.    . levETIRAcetam (KEPPRA) 100 MG/ML solution Take 500 mg by mouth 2 (two) times daily.    Marland Kitchen levothyroxine (SYNTHROID) 125 MCG tablet Take 1 tablet (125 mcg total) by mouth daily at 6 (six) AM.    . lidocaine (LIDODERM) 5 % Place 1 patch onto the skin daily. Remove & Discard patch within 12 hours or as directed by MD (Patient taking differently: Place 1 patch onto the skin daily as needed (for pain- Remove & Discard patch after 12 hours). ) 30 patch 0  . melatonin 3 MG TABS tablet Take 1 tablet (3 mg total) by mouth at bedtime.  0  . Multiple Vitamins-Minerals (LIFE PACK MENS) MISC Take 1 each by mouth See admin instructions. Mix 1 packet into 8 ounces of water and drink once daily at 2 PM    . feeding supplement, ENSURE ENLIVE, (ENSURE ENLIVE) LIQD Take 237 mLs by mouth 3 (three) times daily between meals. 237 mL 12  . LORazepam (ATIVAN) 0.5 MG tablet Take 0.5 tablets (0.25 mg total) by mouth every 8 (eight) hours as needed (for agitation). 10 tablet 0  . nystatin (MYCOSTATIN) 100000 UNIT/ML  suspension Take 5 mLs (500,000 Units total) by mouth 4 (four) times daily. 60 mL 0     Discharge Medications: TAKE these medications   acetaminophen 325 MG tablet Commonly known as: TYLENOL Take 325 mg by mouth every 6 (six) hours as needed (for pain or fever).   divalproex 125 MG capsule Commonly known as: DEPAKOTE SPRINKLE Take 125-250 mg by mouth See admin instructions. Take 250 mg by  mouth in the morning, 125 mg at 2 PM, and 250 mg at bedtime   feeding supplement (ENSURE ENLIVE) Liqd Take 237 mLs by mouth 3 (three) times daily between meals.   folic acid 1 MG tablet Commonly known as: FOLVITE Take 1 mg by mouth daily.   ibandronate 150 MG tablet Commonly known as: BONIVA Take 150 mg by mouth every 30 (thirty) days. Take in the morning with a full glass of water, on an empty stomach, and do not take anything else by mouth or lie down for the next 30 min.   levETIRAcetam 100 MG/ML solution Commonly known as: KEPPRA Take 500 mg by mouth 2 (two) times daily.   levothyroxine 125 MCG tablet Commonly known as: SYNTHROID Take 1 tablet (125 mcg total) by mouth daily at 6 (six) AM.   lidocaine 5 % Commonly known as: LIDODERM Place 1 patch onto the skin daily. Remove & Discard patch within 12 hours or as directed by MD What changed:   when to take this  reasons to take this  additional instructions   Life Pack Mens Misc Take 1 each by mouth See admin instructions. Mix 1 packet into 8 ounces of water and drink once daily at 2 PM   LORazepam 0.5 MG tablet Commonly known as: ATIVAN Take 0.5 tablets (0.25 mg total) by mouth every 8 (eight) hours as needed (for agitation).   melatonin 3 MG Tabs tablet Take 1 tablet (3 mg total) by mouth at bedtime.   nystatin 100000 UNIT/ML suspension Commonly known as: MYCOSTATIN Take 5 mLs (500,000 Units total) by mouth 4 (four) times daily.     Relevant Imaging Results:  Relevant Lab Results:   Additional  Information    Emeterio Reeve, [place]

## 2020-05-26 LAB — HAPTOGLOBIN: Haptoglobin: <10 mg/dL — ABNORMAL LOW (ref 38–329)

## 2020-05-28 LAB — PATHOLOGIST SMEAR REVIEW: Path Review: 10112021

## 2020-05-29 LAB — CULTURE, BLOOD (ROUTINE X 2)
Culture: NO GROWTH
Culture: NO GROWTH
Special Requests: ADEQUATE
Special Requests: ADEQUATE

## 2020-05-30 ENCOUNTER — Other Ambulatory Visit: Payer: Self-pay

## 2020-05-30 ENCOUNTER — Non-Acute Institutional Stay: Payer: Medicare Other | Admitting: Nurse Practitioner

## 2020-05-30 DIAGNOSIS — Z515 Encounter for palliative care: Secondary | ICD-10-CM

## 2020-05-30 DIAGNOSIS — F0391 Unspecified dementia with behavioral disturbance: Secondary | ICD-10-CM

## 2020-05-30 NOTE — Progress Notes (Addendum)
[facility] Consult Note Telephone: [phone]  Fax: [phone]  PATIENT NAME: Eric Larsen [address] [place] [address] [phone] (home)  DOB: [birth year] MRN: [id number]  PRIMARY CARE PROVIDER:    [name], MD,  [address] [place] [address] [phone]  REFERRING PROVIDER:   [name], MD [address] [place] [address]  RESPONSIBLE PARTY:   Extended Emergency Contact Information Primary Emergency Contact: Eric Larsen, Eric Larsen Address: [address]          [place], [place] [address] Eric Larsen of [place] Phone: [phone] Mobile Phone: [phone] Relation: Spouse Eric Larsen (daughter):  [phone]  I met face to face with patient in facility.  ASSESSMENT AND RECOMMENDATIONS:   1. Advance Care Planning/Goals of Care:  Goal of care: goal of care is comfort and loving care. Directives: patient's code status is DNR. Signed DNR form on file in facility, copy uploaded to [facility] EMR.  2. Symptom Management:  Patient with dementia (FAST 6e). Patient s/p recent hospitalization from 05/23/2020 to 05/25/2020 for lethargy, weakness, and weight loss. Patient reported by facility staff to have lost about 50lbs in the last 6 months. Patient noted with leukocytosis (WBC 13.2) in hospital, etiology unknown as work-up was -ve for any infectious etiology.  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 Patient is scheduled for a consult with Hematologist on 07/22/2020. Patient not in any acute distress on exam today, was however confused, oriented to self only, repeats self. Staff report meal intake of about 50-75%, patient on Ensure  nutritional supplement between meals. No report of fever, chills, or SOB. No report of concerning behavior issues, current regimen is Ativan 0.64m (1/2 of a 558mtab) q8h prn. Staff are administering qd to qod, mostly in the mornings. He is on Keppra for h/o seizure activity. Continues on Depakote for behaviors, Melatonin 53m29mor sleep. Phone call made to wife [name], left voice message to call me, to discuss visit findings and review patient's patient's goal of care.  Telephone call made to daughter Eric Sidlepdated on visit findings, daughter expressed unawareness of patient recent hospitalization. Discussed hospital findings and recommendation for Hematology consult with Eric Larsen. Eric Larsen said she was planning on visiting patient in the coming week, [place] if patient condition is critical and if she needs to come right away. Eric Larsen made  aware that patient appeared frail, and it is a good thing she is planning a visit.  3. Follow up Palliative Care Visit: Palliative care will continue to follow for goals of care clarification and symptom management. Return in about 4 weeks or prn.  4. Family /Caregiver/Community Supports: Patient is married, has three children, 2 daughters and a son. 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  5. Cognitive / Functional decline: Patient awake and alert but confused. Patient ambulates with a Rolator walker within his appartment and at times in hallway, staff report he has recently been using manual wheelchair to ambulate to dinning area for meals, able to wheel self. No recent report of falls, last reported fall was 2 months ago.  I  spent 60 minutes providing this consultation, time includes time spent with patient and family on phone, chart review, and documentation. More than 50% of the time in this consultation was spent coordinating  communication.   HISTORY OF PRESENT ILLNESS:  Eric Larsen is a 86 y.o. year old male with multiple medical problems including  Dementia, Osteoporosis, gait disorder, hereditary spherocytosis, history of skin cancer (SCC)  and AKs. Palliative Care was asked to follow this patient by consultation request of [place], Elmo Putt* to help address advance care planning and goals of care. This is a follow up visit from 03/28/2020.  CODE STATUS: DNR  PPS: 30%  HOSPICE ELIGIBILITY/DIAGNOSIS: TBD  PAST MEDICAL HISTORY:  Past Medical History:  Diagnosis Date  . Cognitive decline   . Hypothyroidism   . Osteoporosis   . Thyroid disease     SOCIAL HX:  Social History   Tobacco Use  . Smoking status: Former Research scientist (life sciences)  . Smokeless tobacco: Never Used  . Tobacco comment: Quit approximately 22 years ago  Substance Use Topics  . Alcohol use: Not Currently    Comment: one vodka tonic every evening   FAMILY HX:  Family History  Problem Relation Age of Onset  . Hereditary spherocytosis Father   . Hereditary spherocytosis Sister     ALLERGIES:  Allergies  Allergen Reactions  . Pollen Extract Itching and Other (See Comments)    On MAR- Itchy eyes, runny nose, nasal congestion     PERTINENT MEDICATIONS:  Outpatient Encounter Medications as of 05/30/2020  Medication Sig  . acetaminophen (TYLENOL) 325 MG tablet Take 325 mg by mouth every 6 (six) hours as needed (for pain or fever).   . divalproex (DEPAKOTE SPRINKLE) 125 MG capsule Take 125-250 mg by mouth See admin instructions. Take 250 mg by mouth in the morning, 125 mg at 2 PM, and 250 mg at bedtime  . feeding supplement, ENSURE ENLIVE, (ENSURE ENLIVE) LIQD Take 237 mLs by mouth 3 (three) times daily between meals.  . folic acid (FOLVITE) 1 MG tablet Take 1 mg by mouth daily.  Marland Kitchen ibandronate (BONIVA) 150 MG tablet Take 150 mg by mouth every 30 (thirty) days. Take in the morning with a full glass of water, on an empty stomach, and do not  take anything else by mouth or lie down for the next 30 min.  . levETIRAcetam (KEPPRA) 100 MG/ML solution Take 500 mg by mouth 2 (two) times daily.  Marland Kitchen levothyroxine (SYNTHROID) 125 MCG tablet Take 1 tablet (125 mcg total) by mouth daily at 6 (six) AM.  . lidocaine (LIDODERM) 5 % Place 1 patch onto the skin daily. Remove & Discard patch within 12 hours or as directed by MD (Patient taking differently: Place 1 patch onto the skin daily as needed (for pain- Remove & Discard patch after 12 hours). )  . LORazepam (ATIVAN) 0.5 MG tablet Take 0.5 tablets (0.25 mg total) by mouth every 8 (eight) hours as needed (for agitation).  . melatonin 3 MG TABS tablet Take 1 tablet (3 mg total) by mouth at bedtime.  . Multiple Vitamins-Minerals (LIFE PACK MENS) MISC Take 1 each by mouth See admin instructions. Mix 1 packet into 8 ounces of water and drink once daily at 2 PM  . nystatin (MYCOSTATIN) 100000 UNIT/ML suspension Take 5 mLs (500,000 Units total) by mouth 4 (four) times daily.   No facility-administered encounter medications on file as of 05/30/2020.    PHYSICAL EXAM / ROS:   Current and past weights: 94lbs  down from 112lbs at last visit 2 months ago. Ht: 83f2", BMI 17.2kg/m2 General:  frail appearing, thin, confused, sitting in chair in his room in NAD Cardiovascular: denied chest pain , no edema  Pulmonary: no cough, no increased SOB, room air Abdomen: appetite fair, denied constipation, continent of bowel GU: denies dysuria, incontinent of urine MSK:  no joint and ROM abnormalities, ambulatory Skin: no rashes or wounds reported on exposed skin Neurological: weakness, but otherwise nonfocal  QJari Favre, DNP, AGPCNP-BC

## 2020-07-17 ENCOUNTER — Non-Acute Institutional Stay: Payer: Medicare Other | Admitting: Nurse Practitioner

## 2020-07-17 ENCOUNTER — Other Ambulatory Visit: Payer: Self-pay

## 2020-07-17 DIAGNOSIS — Z515 Encounter for palliative care: Secondary | ICD-10-CM

## 2020-07-17 DIAGNOSIS — R634 Abnormal weight loss: Secondary | ICD-10-CM

## 2020-07-17 NOTE — Progress Notes (Signed)
[facility] Consult Note Telephone: [phone]  Fax: [phone]  PATIENT NAME: Eric Larsen [address] [place] [address] [phone] (home)  DOB: Jul 27, 1931 MRN: [id number]  PRIMARY CARE PROVIDER:    Dr. Conley Canal  Physician [facility]  REFERRING PROVIDER:   [name], MD [address] [place],  [place] [address] [phone]  RESPONSIBLE PARTY:   Extended Emergency Contact Information Primary Emergency Contact: Armida Sans Address: [address]          [place], [place] [address] Johnnette Litter of [place] Phone: [phone] Mobile Phone: [phone] Relation: Spouse  I met face to face with patient in facility.  ASSESSMENT AND RECOMMENDATIONS:   Advance Care Planning: (reviewed prior discussion) Goal of care: Patient's goal of care is comfort and having loving care in his current living arrangement. Directives: Patient's code status is DNR. Signed DNR form on file in facility and on [facility] EMR.  Symptom Management:  Weight loss: Patient had 17lbs weight gain since last palliative care visit on 05/30/2020, staff report weight on 06/18/2020 at 111.5lbs up from 94lbs at last visit. No evidence of fluid overload or edema today. Patient in no acute distress. Patient currently on Ensure Enlive liquid nutritional supplement three times a day, tolerating it well. Staff report meal intake of 50-75%.  Recommendation: Continue current plan of care. Continue to offer and encourage healthy meal choices from a variety of food groups. In addition to Ensure nutritional supplement, encourage snacking between meals. Consider appetite stimulant Remeron 7.67m if weight loss reoccurs.  Follow up Palliative Care Visit: Palliative care will continue to follow for goals of care clarification and symptom management. Return in about 6-8 weeks or prn.  Family /Caregiver/Community  Supports: [facility] He is a retired aForensic psychologist married, has three children, 2 daughters and a son. Wife very involved in his care, staff report she visits him daily.   5. Cognitive / Functional decline: Patient awake and alert, more coherent today than at last visit 6 weeks ago. He is dependent on staff for most of his ADLs, incontinent of bowel and bladder. Patient ambulates with a Rolator walker. No recent report of falls,   I spent 48 minutes providing this consultation, time includes time spent with patient, chart review, provider coordination, and documentation. More than 50% of the time in this consultation was spent coordinating communication.   CHIEF COMPLAINT: Follow up palliative care visit  HISTORY OF PRESENT ILLNESS:  Eric Stuckyis a 86y.o. year old male with multiple medical problems including Dementia (FAST 6e), seizure disorder, Osteoporosis, gait disorder, hereditary spherocytosis, history of skin cancer (SCC)  and AKs. Patient s/p recent hospitalization from 05/23/2020 to 05/25/2020 for lethargy, weakness, and weight loss. Patient noted with leukocytosis (WBC 13.2) in hospital, etiology unknown as work-up was -ve for any infectious etiology.  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 Patient is scheduled for a consult with Hematologist on 07/22/2020. Palliative Care was asked to help address advance care planning and goals of care. This is a follow up visit from 05/30/2020.  CODE STATUS: DNR  PPS: 40%  HOSPICE ELIGIBILITY/DIAGNOSIS: TBD  PHYSICAL EXAM / ROS:   Current and past weights: 115.5lbs  up from 112lbs at last visit a month ago, Ht 61f2", BMI 21.8kg/m2 Cardiovascular: denied chest pain, denied palpitation, no edema   Pulmonary: no cough, no SOB, room air Abdomen: appetite fair, denied constipation, abdomen soft and non-tender, incontinent of bowel GU: denies dysuria, incontinent of urine MSK:  ambulatory Skin: no wounds reported Neurological: weakness, but otherwise nonfocal  PAST MEDICAL HISTORY:  Past Medical History:  Diagnosis Date   Cognitive decline    Hypothyroidism    Osteoporosis    Thyroid disease     SOCIAL HX:  Social History   Tobacco Use   Smoking status: Former Smoker   Smokeless tobacco: Never Used   Tobacco comment: Quit approximately 22 years ago  Substance Use Topics   Alcohol use: Not Currently    Comment: one vodka tonic every evening   FAMILY HX:  Family History  Problem Relation Age of Onset   Hereditary spherocytosis Father    Hereditary spherocytosis Sister     ALLERGIES:  Allergies  Allergen Reactions   Pollen Extract Itching and Other (See Comments)    On MAR- Itchy eyes, runny nose, nasal congestion     PERTINENT MEDICATIONS:  Outpatient Encounter Medications as of 84/08/2019  Medication Sig   acetaminophen (TYLENOL) 325 MG tablet Take 325 mg by mouth every 6 (six) hours as needed (for pain or fever).    divalproex (DEPAKOTE SPRINKLE) 125 MG capsule Take 125-250 mg by mouth See admin instructions. Take 250 mg by mouth in the morning, 125 mg at 2 PM, and 250 mg at bedtime   feeding supplement, ENSURE ENLIVE, (ENSURE ENLIVE) LIQD Take 237 mLs by mouth 3 (three) times daily between meals.   folic acid (FOLVITE) 1 MG tablet Take 1 mg by mouth daily.   ibandronate (BONIVA) 150 MG tablet Take 150 mg by mouth every 30 (thirty) days. Take in the morning with a full glass of water, on an empty stomach, and do not take anything else by mouth or lie down for the next 30 min.   levETIRAcetam (KEPPRA) 100 MG/ML solution Take 500 mg by mouth 2 (two) times daily.   levothyroxine (SYNTHROID) 125 MCG tablet Take 1 tablet (125 mcg total) by mouth daily  at 6 (six) AM.   lidocaine (LIDODERM) 5 % Place 1 patch onto the skin daily. Remove & Discard patch within 12 hours or as directed by MD (Patient taking differently: Place 1 patch onto the skin daily as needed (for pain- Remove & Discard patch after 12 hours). )   LORazepam (ATIVAN) 0.5 MG tablet Take 0.5 tablets (0.25 mg total) by mouth every 8 (eight) hours as needed (for agitation).   melatonin 3 MG TABS tablet Take 1 tablet (3 mg total) by mouth at bedtime.   Multiple Vitamins-Minerals (LIFE PACK MENS) MISC Take 1 each by mouth See admin instructions. Mix 1 packet into 8 ounces of water and drink once daily at 2 PM   nystatin (MYCOSTATIN) 100000 UNIT/ML suspension Take 5 mLs (500,000 Units total) by mouth 4 (four) times daily.   No facility-administered encounter medications on file as of 07/17/2020.     QAlba Destine NP , DNP, AGPCNP-BC

## 2020-07-24 ENCOUNTER — Inpatient Hospital Stay (HOSPITAL_COMMUNITY)
Admission: EM | Admit: 2020-07-24 | Discharge: 2020-08-03 | DRG: 178 | Disposition: A | Discharge status: Hospice | Payer: Medicare Other | Attending: Internal Medicine | Admitting: Internal Medicine

## 2020-07-24 ENCOUNTER — Emergency Department (HOSPITAL_COMMUNITY): Payer: Medicare Other

## 2020-07-24 ENCOUNTER — Encounter (HOSPITAL_COMMUNITY): Payer: Self-pay | Admitting: Emergency Medicine

## 2020-07-24 ENCOUNTER — Other Ambulatory Visit: Payer: Self-pay

## 2020-07-24 DIAGNOSIS — R262 Difficulty in walking, not elsewhere classified: Secondary | ICD-10-CM | POA: Diagnosis present

## 2020-07-24 DIAGNOSIS — Z86010 Personal history of colonic polyps: Secondary | ICD-10-CM

## 2020-07-24 DIAGNOSIS — F0280 Dementia in other diseases classified elsewhere without behavioral disturbance: Secondary | ICD-10-CM | POA: Diagnosis present

## 2020-07-24 DIAGNOSIS — J301 Allergic rhinitis due to pollen: Secondary | ICD-10-CM | POA: Diagnosis present

## 2020-07-24 DIAGNOSIS — M4854XA Collapsed vertebra, not elsewhere classified, thoracic region, initial encounter for fracture: Secondary | ICD-10-CM | POA: Diagnosis present

## 2020-07-24 DIAGNOSIS — Z96642 Presence of left artificial hip joint: Secondary | ICD-10-CM | POA: Diagnosis present

## 2020-07-24 DIAGNOSIS — S065X9A Traumatic subdural hemorrhage with loss of consciousness of unspecified duration, initial encounter: Secondary | ICD-10-CM | POA: Diagnosis present

## 2020-07-24 DIAGNOSIS — L8990 Pressure ulcer of unspecified site, unspecified stage: Secondary | ICD-10-CM | POA: Insufficient documentation

## 2020-07-24 DIAGNOSIS — S065XAA Traumatic subdural hemorrhage with loss of consciousness status unknown, initial encounter: Secondary | ICD-10-CM | POA: Diagnosis present

## 2020-07-24 DIAGNOSIS — J690 Pneumonitis due to inhalation of food and vomit: Secondary | ICD-10-CM | POA: Diagnosis not present

## 2020-07-24 DIAGNOSIS — W19XXXA Unspecified fall, initial encounter: Secondary | ICD-10-CM

## 2020-07-24 DIAGNOSIS — F05 Delirium due to known physiological condition: Secondary | ICD-10-CM | POA: Diagnosis present

## 2020-07-24 DIAGNOSIS — E860 Dehydration: Secondary | ICD-10-CM | POA: Diagnosis present

## 2020-07-24 DIAGNOSIS — L89101 Pressure ulcer of unspecified part of back, stage 1: Secondary | ICD-10-CM | POA: Diagnosis present

## 2020-07-24 DIAGNOSIS — Z79890 Hormone replacement therapy (postmenopausal): Secondary | ICD-10-CM

## 2020-07-24 DIAGNOSIS — K5900 Constipation, unspecified: Secondary | ICD-10-CM | POA: Diagnosis present

## 2020-07-24 DIAGNOSIS — E039 Hypothyroidism, unspecified: Secondary | ICD-10-CM | POA: Diagnosis present

## 2020-07-24 DIAGNOSIS — Z515 Encounter for palliative care: Secondary | ICD-10-CM

## 2020-07-24 DIAGNOSIS — E876 Hypokalemia: Secondary | ICD-10-CM | POA: Diagnosis not present

## 2020-07-24 DIAGNOSIS — Z20822 Contact with and (suspected) exposure to covid-19: Secondary | ICD-10-CM | POA: Diagnosis present

## 2020-07-24 DIAGNOSIS — E079 Disorder of thyroid, unspecified: Secondary | ICD-10-CM | POA: Diagnosis present

## 2020-07-24 DIAGNOSIS — E870 Hyperosmolality and hypernatremia: Secondary | ICD-10-CM | POA: Diagnosis not present

## 2020-07-24 DIAGNOSIS — D72829 Elevated white blood cell count, unspecified: Secondary | ICD-10-CM

## 2020-07-24 DIAGNOSIS — R059 Cough, unspecified: Secondary | ICD-10-CM

## 2020-07-24 DIAGNOSIS — Z9049 Acquired absence of other specified parts of digestive tract: Secondary | ICD-10-CM

## 2020-07-24 DIAGNOSIS — Z66 Do not resuscitate: Secondary | ICD-10-CM | POA: Diagnosis present

## 2020-07-24 DIAGNOSIS — M810 Age-related osteoporosis without current pathological fracture: Secondary | ICD-10-CM | POA: Diagnosis present

## 2020-07-24 DIAGNOSIS — R64 Cachexia: Secondary | ICD-10-CM | POA: Diagnosis present

## 2020-07-24 DIAGNOSIS — Z79899 Other long term (current) drug therapy: Secondary | ICD-10-CM

## 2020-07-24 DIAGNOSIS — D580 Hereditary spherocytosis: Secondary | ICD-10-CM | POA: Diagnosis present

## 2020-07-24 DIAGNOSIS — R451 Restlessness and agitation: Secondary | ICD-10-CM | POA: Diagnosis not present

## 2020-07-24 DIAGNOSIS — R531 Weakness: Secondary | ICD-10-CM

## 2020-07-24 DIAGNOSIS — G40909 Epilepsy, unspecified, not intractable, without status epilepticus: Secondary | ICD-10-CM | POA: Diagnosis present

## 2020-07-24 DIAGNOSIS — Z85828 Personal history of other malignant neoplasm of skin: Secondary | ICD-10-CM

## 2020-07-24 DIAGNOSIS — Z6820 Body mass index (BMI) 20.0-20.9, adult: Secondary | ICD-10-CM

## 2020-07-24 LAB — CBC WITH DIFFERENTIAL/PLATELET
Abs Immature Granulocytes: 0.36 10*3/uL — ABNORMAL HIGH (ref 0.00–0.07)
Basophils Absolute: 0.1 10*3/uL (ref 0.0–0.1)
Basophils Relative: 0 %
Eosinophils Absolute: 0 10*3/uL (ref 0.0–0.5)
Eosinophils Relative: 0 %
HCT: 40.4 % (ref 39.0–52.0)
Hemoglobin: 13.8 g/dL (ref 13.0–17.0)
Immature Granulocytes: 2 %
Lymphocytes Relative: 7 %
Lymphs Abs: 1.1 10*3/uL (ref 0.7–4.0)
MCH: 34.2 pg — ABNORMAL HIGH (ref 26.0–34.0)
MCHC: 34.2 g/dL (ref 30.0–36.0)
MCV: 100 fL (ref 80.0–100.0)
Monocytes Absolute: 2.1 10*3/uL — ABNORMAL HIGH (ref 0.1–1.0)
Monocytes Relative: 13 %
Neutro Abs: 12.5 10*3/uL — ABNORMAL HIGH (ref 1.7–7.7)
Neutrophils Relative %: 78 %
Platelets: 207 10*3/uL (ref 150–400)
RBC: 4.04 MIL/uL — ABNORMAL LOW (ref 4.22–5.81)
RDW: 21.3 % — ABNORMAL HIGH (ref 11.5–15.5)
WBC: 16.1 10*3/uL — ABNORMAL HIGH (ref 4.0–10.5)
nRBC: 0 % (ref 0.0–0.2)

## 2020-07-24 LAB — COMPREHENSIVE METABOLIC PANEL
ALT: 17 U/L (ref 0–44)
AST: 27 U/L (ref 15–41)
Albumin: 4 g/dL (ref 3.5–5.0)
Alkaline Phosphatase: 58 U/L (ref 38–126)
Anion gap: 11 (ref 5–15)
BUN: 29 mg/dL — ABNORMAL HIGH (ref 8–23)
CO2: 27 mmol/L (ref 22–32)
Calcium: 9.6 mg/dL (ref 8.9–10.3)
Chloride: 104 mmol/L (ref 98–111)
Creatinine, Ser: 0.85 mg/dL (ref 0.61–1.24)
GFR, Estimated: >60 mL/min (ref >60)
Glucose, Bld: 143 mg/dL — ABNORMAL HIGH (ref 70–99)
Potassium: 4.3 mmol/L (ref 3.5–5.1)
Sodium: 142 mmol/L (ref 135–145)
Total Bilirubin: 3.2 mg/dL — ABNORMAL HIGH (ref 0.3–1.2)
Total Protein: 7.4 g/dL (ref 6.5–8.1)

## 2020-07-24 LAB — URINALYSIS, ROUTINE W REFLEX MICROSCOPIC
Bilirubin Urine: NEGATIVE
Glucose, UA: NEGATIVE mg/dL
Hgb urine dipstick: NEGATIVE
Ketones, ur: 20 mg/dL — AB
Leukocytes,Ua: NEGATIVE
Nitrite: NEGATIVE
Protein, ur: NEGATIVE mg/dL
Specific Gravity, Urine: 1.02 (ref 1.005–1.030)
pH: 6 (ref 5.0–8.0)

## 2020-07-24 LAB — CBG MONITORING, ED: Glucose-Capillary: 133 mg/dL — ABNORMAL HIGH (ref 70–99)

## 2020-07-24 LAB — RESP PANEL BY RT-PCR (FLU A&B, COVID) ARPGX2
Influenza A by PCR: NEGATIVE
Influenza B by PCR: NEGATIVE
SARS Coronavirus 2 by RT PCR: NEGATIVE

## 2020-07-24 MED ORDER — LACTATED RINGERS IV BOLUS
1000.0000 mL | Freq: Once | INTRAVENOUS | Status: AC
  Administered 2020-07-24: 1000 mL via INTRAVENOUS

## 2020-07-24 MED ORDER — LORAZEPAM 0.5 MG PO TABS
0.5000 mg | ORAL_TABLET | Freq: Once | ORAL | Status: AC
  Administered 2020-07-24: 0.5 mg via ORAL
  Filled 2020-07-24: qty 1

## 2020-07-24 NOTE — ED Triage Notes (Signed)
Pt from [facility] c/o weakness, fatigue that started yesterday and fever that started today. Pt had a fall on Monday, landed on his left side. Family reports pt now has back tenderness. Pt was not evaluated after the fall. Denies hitting his head and LOC with fall.

## 2020-07-24 NOTE — ED Notes (Signed)
Rectal temp was 99.8

## 2020-07-24 NOTE — ED Provider Notes (Signed)
[facility] DEPT Provider Note   CSN: [id number] Arrival date & time: 07/24/20  1938     History Chief Complaint  Patient presents with  . Fall  . Fatigue    Eric Larsen is a 86 y.o. male with a history of dementia, familial spherocytosis hypothyroidism, left hip fracture s/p total hip replacement who presents to the emergency department accompanied by his wife from [facility] with a chief complaint of generalized weakness.  Patient was given the the [employer] booster on July 22, 2020.  On the drive back from the facility, he fell while getting out of the car and sustained an abrasion to the left elbow and to his mid back.  His wife reports that he did not hit his head.  No nausea, vomiting, or loss of consciousness.  He was able to stand and walk after the fall, but has been intermittently complaining of pain is had increased difficulty walking.  His wife reports that she was told by the facility that he was having a good day today, but then later received a call this afternoon that the patient suddenly was unable to walk.  They recorded his temperature at 99.2, and he was advised to come to the ER for further work-up and evaluation.  His wife reports that he is oriented to self at baseline; however, she does note that he does get more confused when he is under increased stress and with infection.  She does report that he has had recurrent episodes of pneumonia, but has received his pneumonia vaccination.  He was last admitted to the hospital from 10/7-10/9 for a leukocytosis for an infectious work-up that was negative.  The patient was hydrated and discharged.  The patient has no complaints at this time.  Level 5 caveat secondary to dementia.  The history is provided by the patient, medical records, the spouse and the nursing home. No language interpreter was used.       Past Medical History:  Diagnosis Date  . Cognitive decline    . Hypothyroidism   . Osteoporosis   . Thyroid disease     Patient Active Problem List   Diagnosis Date Noted  . Weakness generalized 07/25/2020  . Protein-calorie malnutrition, severe 05/25/2020  . Leukocytosis 05/24/2020  . Weight loss, unintentional 05/24/2020  . Abnormality of gait 01/01/2020  . Hypokalemia   . Sleep disturbance   . Dementia associated with other underlying disease without behavioral disturbance ([place])   . Palliative care encounter   . Lethargy   . Acute blood loss anemia   . Hypoalbuminemia due to protein-calorie malnutrition ([place])   . Microcytic anemia   . Dysphagia   . Encephalitis 10/18/2019  . Abnormal EEG   . Hypothyroidism   . Macrocytic anemia   . 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 Hip fracture ([place]) 08/12/2017  . Hallux valgus of left foot 04/28/2017  . Hammer toe of left foot 04/28/2017  . Corn of toe 04/28/2017  . History of total left hip replacement 09/19/2015  . Spherocytosis (familial) ([place]) 09/17/2015  . Mixed hyperlipidemia 06/19/2015  . Benign prostatic hyperplasia with lower urinary tract symptoms 06/19/2015  . History of fracture of vertebra 11/06/2014  . History of colon polyps 08/08/2014  . Pathological fracture 11/01/2013  . Toenail fungus 01/20/2013  . Vitamin D deficiency 09/27/2012  . Primary hypothyroidism 09/27/2012  . Osteoporosis 09/27/2012  . Spherocytosis ([place]) 12/04/2011  . Skin cancer 12/04/2011  Past Surgical History:  Procedure Laterality Date  . CHOLECYSTECTOMY    . HIP FRACTURE SURGERY    . INTRAMEDULLARY (IM) NAIL INTERTROCHANTERIC Right 08/13/2017   Procedure: INTRAMEDULLARY (IM) NAIL INTERTROCHANTRIC;  Surgeon: Earnestine Leys, MD;  Location: [facility] ORS;  Service: Orthopedics;  Laterality: Right;  . SHOULDER SURGERY         Family History  Problem Relation Age of Onset  . Hereditary spherocytosis Father   . Hereditary spherocytosis Sister     Social History    Tobacco Use  . Smoking status: Former Research scientist (life sciences)  . Smokeless tobacco: Never Used  . Tobacco comment: Quit approximately 22 years ago  Substance Use Topics  . Alcohol use: Not Currently    Comment: one vodka tonic every evening  . Drug use: Never    Home Medications Prior to Admission medications   Medication Sig Start Date End Date Taking? Authorizing Provider  divalproex (DEPAKOTE SPRINKLE) 125 MG capsule Take 125-250 mg by mouth See admin instructions. Take 250 mg by mouth in the morning, 125 mg at 2 PM, and 250 mg at bedtime   Yes [provider]  feeding supplement, ENSURE ENLIVE, (ENSURE ENLIVE) LIQD Take 237 mLs by mouth 3 (three) times daily between meals. 05/25/20  Yes Regalado, Belkys A, MD  folic acid (FOLVITE) 1 MG tablet Take 1 mg by mouth daily.   Yes [provider]  levETIRAcetam (KEPPRA) 100 MG/ML solution Take 500 mg by mouth 2 (two) times daily.   Yes [provider]  levothyroxine (SYNTHROID) 125 MCG tablet Take 1 tablet (125 mcg total) by mouth daily at 6 (six) AM. 11/17/19  Yes Angiulli, Lavon Paganini, PA-C  lidocaine (LIDODERM) 5 % Place 1 patch onto the skin daily. Remove & Discard patch within 12 hours or as directed by MD Patient taking differently: Place 1 patch onto the skin daily as needed (for pain- Remove & Discard patch after 12 hours). 11/16/19  Yes Angiulli, Lavon Paganini, PA-C  Multiple Vitamins-Minerals (LIFE PACK MENS) MISC Take 1 each by mouth See admin instructions. Mix 1 packet into 8 ounces of water and drink once daily at 2 PM   Yes [provider]  acetaminophen (TYLENOL) 325 MG tablet Take 325 mg by mouth every 6 (six) hours as needed (for pain or fever).     [provider]  ibandronate (BONIVA) 150 MG tablet Take 150 mg by mouth every 30 (thirty) days. Take in the morning with a full glass of water, on an empty stomach, and do not take anything else by mouth or lie down for the next 30 min.    [provider]   LORazepam (ATIVAN) 0.5 MG tablet Take 0.5 tablets (0.25 mg total) by mouth every 8 (eight) hours as needed (for agitation). 05/25/20   Regalado, Belkys A, MD  melatonin 3 MG TABS tablet Take 1 tablet (3 mg total) by mouth at bedtime. 11/16/19   Angiulli, Lavon Paganini, PA-C  nystatin (MYCOSTATIN) 100000 UNIT/ML suspension Take 5 mLs (500,000 Units total) by mouth 4 (four) times daily. 05/25/20   Regalado, Belkys A, MD    Allergies    Pollen extract  Review of Systems   Review of Systems  Unable to perform ROS: Mental status change    Physical Exam Updated Vital Signs BP 131/80   Pulse 77   Temp 99.8 F (37.7 C) (Rectal)   Resp 14   Ht 5\' 3"  (1.6 m)   Wt 53.5 kg   SpO2 96%  BMI 20.90 kg/m   Physical Exam Vitals and nursing note reviewed.  Constitutional:      General: He is not in acute distress.    Appearance: He is well-developed. He is not ill-appearing, toxic-appearing or diaphoretic.     Comments: Elderly, cachectic, pleasant male in no acute distress.  HENT:     Head: Normocephalic.     Mouth/Throat:     Mouth: Mucous membranes are dry.  Eyes:     General: No scleral icterus.    Extraocular Movements: Extraocular movements intact.     Conjunctiva/sclera: Conjunctivae normal.     Pupils: Pupils are equal, round, and reactive to light.  Neck:     Comments: No meningismus. Cardiovascular:     Rate and Rhythm: Regular rhythm. Tachycardia present.     Pulses: Normal pulses.     Heart sounds: Normal heart sounds. No murmur heard. No friction rub. No gallop.   Pulmonary:     Effort: Pulmonary effort is normal. No respiratory distress.     Breath sounds: No stridor. No wheezing, rhonchi or rales.  Chest:     Chest wall: No tenderness.  Abdominal:     General: There is no distension.     Palpations: Abdomen is soft. There is no mass.     Tenderness: There is no abdominal tenderness. There is no right CVA tenderness, left CVA tenderness, guarding or rebound.     Hernia:  No hernia is present.     Comments: Abdomen is soft, nondistended, nontender.  Urine bag is noted on the patient.  Dark, amber-colored urine is noted in the canister at bedside.  Musculoskeletal:     Cervical back: Normal range of motion and neck supple.     Right lower leg: No edema.     Left lower leg: No edema.     Comments: Full active and passive range of motion of all joints of the bilateral lower extremities.  Spine is nontender.  No crepitus or step-offs.  Skin:    General: Skin is warm and dry.     Capillary Refill: Capillary refill takes 2 to 3 seconds.  Neurological:     Mental Status: He is alert.     Comments: Oriented to self.  Moves all 4 extremities.  5-5 strength against resistance of the bilateral upper and lower extremities.  Sensation is intact and equal throughout the upper and lower extremities.  Follows simple commands.  Cranial nerves II through XII are grossly intact.  Gait exam deferred at this time.  Psychiatric:        Behavior: Behavior normal.     ED Results / Procedures / Treatments   Labs (all labs ordered are listed, but only abnormal results are displayed) Labs Reviewed  URINALYSIS, ROUTINE W REFLEX MICROSCOPIC - Abnormal; Notable for the following components:      Result Value   Ketones, ur 20 (*)    All other components within normal limits  COMPREHENSIVE METABOLIC PANEL - Abnormal; Notable for the following components:   Glucose, Bld 143 (*)    BUN 29 (*)    Total Bilirubin 3.2 (*)    All other components within normal limits  CBC WITH DIFFERENTIAL/PLATELET - Abnormal; Notable for the following components:   WBC 16.1 (*)    RBC 4.04 (*)    MCH 34.2 (*)    RDW 21.3 (*)    Neutro Abs 12.5 (*)    Monocytes Absolute 2.1 (*)    Abs Immature Granulocytes  0.36 (*)    All other components within normal limits  CBG MONITORING, ED - Abnormal; Notable for the following components:   Glucose-Capillary 133 (*)    All other components within normal  limits  RESP PANEL BY RT-PCR (FLU A&B, COVID) [id number]  URINE CULTURE    EKG EKG Interpretation  Date/Time:  Thursday July 25 2020 00:46:42 EST Ventricular Rate:  93 PR Interval:    QRS Duration: 117 QT Interval:  376 QTC Calculation: 468 R Axis:   32 Text Interpretation: Sinus tachycardia Atrial premature complexes in couplets Incomplete right bundle branch block Rate slower Otherwise no significant change Confirmed by Deno Etienne [phone]) on 07/25/2020 1:08:45 AM   Radiology CT HEAD WO CONTRAST  Result Date: 07/25/2020 CLINICAL DATA:  Fall 07/22/2020 EXAM: CT HEAD WITHOUT CONTRAST TECHNIQUE: Contiguous axial images were obtained from the base of the skull through the vertex without intravenous contrast. COMPARISON:  03/30/2020 FINDINGS: Brain: Significant brain atrophy, especially at the temporal lobes where there is asymmetric gliosis on the left. The gliosis is medial and anterior and could be posttraumatic or post ischemic. Trace (3 mm in thickness) right parafalcine thickening of the posterior more than anterior falx, only convincing for hematoma given the comparison. No infarct, obstructive hydrocephalus, or masslike finding. Vascular: Atheromatous calcification Skull: No acute or aggressive finding. Trabecular coarsening over the greater wing sphenoid and squamosal left temporal bone which could be from fibrous dysplasia or Paget's disease. Sinuses/Orbits: Bilateral cataract resection Critical Value/emergent results were called by telephone at the time of interpretation on 07/25/2020 at 5:39 am to provider [facility] , who verbally acknowledged these results. IMPRESSION: 1. Trace falcine subdural hematoma, only convincing given a recent comparison. 2. Brain atrophy especially prominent the temporal lobes. 3. Remote infarct or contusion at the left temporal lobe. Electronically Signed   By: Monte Fantasia M.D.   On: 07/25/2020 06:02   DG Chest Portable 1 View  Result Date:  07/24/2020 CLINICAL DATA:  86 year old male with cough and confusion. Weakness, fever starting today. EXAM: PORTABLE CHEST 1 VIEW COMPARISON:  Chest radiographs 05/24/2020 and earlier. FINDINGS: Portable AP semi upright view at 2334 hours. Stable lung volumes and mediastinal contours. Allowing for portable technique the lungs are clear. No pneumothorax. Visible bowel gas pattern is within normal limits. Stable visualized osseous structures, including previous left lateral rib fractures, left humerus ORIF. Stable cholecystectomy clips. IMPRESSION: No acute cardiopulmonary abnormality. Electronically Signed   By: Genevie Ann M.D.   On: 07/24/2020 23:51   CT Angio Chest/Abd/Pel for Dissection W and/or Wo Contrast  Result Date: 07/25/2020 CLINICAL DATA:  Abdominal pain. Concern for aortic dissection. Weakness for 2 days. EXAM: CT ANGIOGRAPHY CHEST, ABDOMEN AND PELVIS TECHNIQUE: Non-contrast CT of the chest was initially obtained. Multidetector CT imaging through the chest, abdomen and pelvis was performed using the standard protocol during bolus administration of intravenous contrast. Multiplanar reconstructed images and MIPs were obtained and reviewed to evaluate the vascular anatomy. CONTRAST:  43mL OMNIPAQUE IOHEXOL 350 MG/ML SOLN COMPARISON:  CT chest dated 08/16/2017. FINDINGS: CTA CHEST FINDINGS Cardiovascular: The heart size is mildly enlarged. There are coronary artery calcifications. Moderate atherosclerotic changes are noted of the thoracic aorta without evidence for dissection or aneurysm. The arch vessels are grossly patent where visualized. There is no large centrally located pulmonary embolism. Mediastinum/Nodes: -- No mediastinal lymphadenopathy. -- No hilar lymphadenopathy. -- No axillary lymphadenopathy. -- No supraclavicular lymphadenopathy. -- Normal thyroid gland where visualized. -  Unremarkable esophagus. Lungs/Pleura: Mild emphysematous changes  are noted bilaterally. There is atelectasis at the  lung bases, right greater than left. There is no pneumothorax or large pleural effusion. The trachea is unremarkable. Musculoskeletal: There is age-indeterminate height loss of the T1 and T2 vertebral bodies, new since prior study in 2018. Additional chronic appearing height loss is noted of multiple vertebral bodies throughout the thoracic spine. There is an old healed fracture of the sternum. Review of the MIP images confirms the above findings. CTA ABDOMEN AND PELVIS FINDINGS VASCULAR Aorta: There are atherosclerotic changes throughout the abdominal aorta without evidence for an aneurysm or dissection. 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 SMA: There is moderate narrowing of the proximal SMA. Renals: There is moderate narrowing of both renal arteries. IMA: Patent without evidence of aneurysm, dissection, vasculitis or significant stenosis. Inflow: Patent without evidence of aneurysm, dissection, vasculitis or significant stenosis. Veins: No obvious venous abnormality within the limitations of this arterial phase study. Review of the MIP images confirms the above findings. NON-VASCULAR Hepatobiliary: The liver is normal. Status post cholecystectomy.There is mild extrahepatic and intrahepatic biliary ductal dilatation. Pancreas: Normal contours without ductal dilatation. No peripancreatic fluid collection. Spleen: Spleen is borderline enlarged measuring 12 cm craniocaudad. Adrenals/Urinary Tract: --Adrenal glands: Unremarkable. --Right kidney/ureter: No hydronephrosis or radiopaque kidney stones. --Left kidney/ureter: There is an indeterminate exophytic lesion involving the posterior interpolar region of the left kidney measuring approximately 1.8 cm and 42 Hounsfield units. --Urinary bladder: The urinary bladder cannot be well evaluated secondary to extensive streak artifact. Stomach/Bowel: --Stomach/Duodenum: No hiatal hernia or other  gastric abnormality. Normal duodenal course and caliber. --Small bowel: Unremarkable. --Colon: There is a large amount of stool throughout the colon, especially in the rectum and cecum. --Appendix: Not visualized. No right lower quadrant inflammation or free fluid. Lymphatic: Normal course and caliber of the major abdominal vessels. --No retroperitoneal lymphadenopathy. --No mesenteric lymphadenopathy. --No pelvic or inguinal lymphadenopathy. Reproductive: The prostate gland is enlarged. Other: No ascites or free air. The abdominal wall is normal. Musculoskeletal. There is chronic appearing height loss of multiple lumbar vertebral bodies including the L2, L3, and L4 vertebral bodies. There is no definite acute compression fracture identified on today's study. The patient is status post prior total hip arthroplasty on the left and intramedullary nailing of the right femur. There is a sclerotic lesion in the right iliac bone. Review of the MIP images confirms the above findings. IMPRESSION: 1. No evidence for aortic dissection. No evidence for large centrally located pulmonary embolism. 2. Age-indeterminate height loss of the T1 and T2 vertebral bodies, new since prior study in 2018. Additional chronic appearing height loss is noted of multiple vertebral bodies throughout the thoracic and lumbar spine. Diffuse osteopenia is noted. 3. There is a large amount of stool throughout the colon, especially in the rectum and cecum. Correlate for constipation. 4. Indeterminate exophytic lesion involving the posterior interpolar region of the left kidney measuring approximately 1.8 cm and 42 Hounsfield units. While this may represent a hemorrhagic or proteinaceous cyst, a solid mass is not excluded. Follow-up with a nonemergent outpatient renal ultrasound is recommended for further evaluation. 5. Cardiomegaly and coronary artery calcifications. 6. Mild intrahepatic and extrahepatic biliary ductal dilatation. Correlation with  laboratory studies is recommended. Aortic Atherosclerosis (ICD10-I70.0) and Emphysema (ICD10-J43.9). Electronically Signed   By: Constance Holster M.D.   On: 07/25/2020 03:01    Procedures Procedures (including critical care time)  Medications Ordered in ED Medications  acetaminophen (TYLENOL) tablet  650 mg (has no administration in time range)  LORazepam (ATIVAN) tablet 0.5 mg (0.5 mg Oral Given 07/24/20 2329)  lactated ringers bolus 1,000 mL (0 mLs Intravenous Stopped 07/25/20 0014)  sodium chloride (PF) 0.9 % injection (  Given by Other 07/25/20 0216)  iohexol (OMNIPAQUE) 350 MG/ML injection 100 mL (80 mLs Intravenous Contrast Given 07/25/20 0232)    ED Course  I have reviewed the triage vital signs and the nursing notes.  Pertinent labs & imaging results that were available during my care of the patient were reviewed by me and considered in my medical decision making (see chart for details).    MDM Rules/Calculators/A&P                          86 year old male with a history of dementia, familial spherocytosis hypothyroidism, left hip fracture s/p total hip replacement who presents the emergency department with a chief complaint of difficulty walking.  The patient was seen and independently evaluated by Dr. Tyrone Nine, attending physician.  The patient fell 2 days ago.  He did not hit his head.  He is not anticoagulated.  He he has an abrasion to his mid back and the left elbow pain.  On my evaluation, he has no tenderness to palpation to the spinous processes of the cervical, thoracic, or lumbar spine or crepitus or step-offs.  Abdomen is benign.  He is oriented to self and is in no acute distress.  Although he is only oriented to self, his neurologic exam is otherwise unremarkable.  Doubt intracranial hemorrhage or CVA.  Patient has mild tachycardia.  Oxygen saturation is in the low 90s.  Rectal temp is 99.8.    Labs and imaging have been independently reviewed by me.  He has a  leukocytosis of 16.  No metabolic derangements.  Total bilirubin is elevated, but this is chronic.  UA is dark amber and appears hemoconcentrated, but is not concerning for infection.  Chest x-ray is unremarkable. Patient's medical record was reviewed and he did have an admission in October 2021 for leukocytosis of unclear etiology and had a negative infectious work-up he was treated with IV fluids and was discharged.  Suspect dehydration may be a component of the patient's symptoms as his physical exam was unremarkable.  He was given 1 L of LR and on reevaluation, patient was now uncomfortable appearing and was endorsing pain, but was unable to communicate where he was having pain.  He was evaluated by bedside with Dr. Tyrone Nine at this time.  He is now endorsing tenderness palpation in the bilateral upper abdomen.  He does also appear to have mild tachypnea and tachycardia has increased into the 120s.  I reviewed previous imaging of the patient's chest abdomen pelvis.  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  Given acute upper abdominal pain in the setting of recent trauma, will order CTA dissection study.  There is also concern for acute pulmonary edema, but echo did not demonstrate any history of prior heart failure and patient's lungs are clear to auscultation bilaterally on my evaluation.  CTA with indeterminate exophytic lesion involving the posterior interpolar region of the left kidney that may represent a hemorrhagic or proteinaceous cyst versus a solid mass.  A nonemergent outpatient renal ultrasound is recommended.  He does have mild intrahepatic and extrahepatic biliary ductal dilatation.  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  He also has a large amount of stool throughout the colon, especially in the rectum and cecum.  Additionally, he has chronic appearing height loss of L2, L3, L4, T1, and T2.  T1 and T2 were noted to be new since  prior study in 2018.  However, it appears that the patient was seen for a T1 fracture fracture in August 2021.  A T3 fracture was also noted at this time.  Following CTA, patient appears much more comfortable.  Tachypnea and tachycardia had resolved.  Patient had good urine output.  Given concern that patient has not been ambulating well since his fall 2 days ago, ambulation was attempted with a walker and two-person assist was required.  His wife reports that he is able to ambulate with a walker at baseline independently.  Given that he is still having difficulty walking, he will require admission for further work-up and evaluation.  Consult to the hospitalist team and spoke with Dr. Hal Hope who will accept the patient for admission. The patient appears reasonably stabilized for admission considering the current resources, flow, and capabilities available in the ED at this time, and I doubt any other [facility] requiring further screening and/or treatment in the ED prior to admission.  Final Clinical Impression(s) / ED Diagnoses Final diagnoses:  Fall from standing, initial encounter  Constipation, unspecified constipation type  Generalized weakness  Dehydration    Rx / DC Orders ED Discharge Orders    None       Tiani Stanbery, [place], PA-C 07/25/20 [place], [place], DO 07/25/20 [phone]

## 2020-07-24 NOTE — ED Notes (Signed)
Placed pt on male purewick

## 2020-07-25 ENCOUNTER — Observation Stay (HOSPITAL_COMMUNITY): Payer: Medicare Other

## 2020-07-25 ENCOUNTER — Encounter (HOSPITAL_COMMUNITY): Payer: Self-pay | Admitting: Internal Medicine

## 2020-07-25 ENCOUNTER — Emergency Department (HOSPITAL_COMMUNITY): Payer: Medicare Other

## 2020-07-25 DIAGNOSIS — J301 Allergic rhinitis due to pollen: Secondary | ICD-10-CM | POA: Diagnosis present

## 2020-07-25 DIAGNOSIS — S065X9A Traumatic subdural hemorrhage with loss of consciousness of unspecified duration, initial encounter: Secondary | ICD-10-CM | POA: Diagnosis not present

## 2020-07-25 DIAGNOSIS — R64 Cachexia: Secondary | ICD-10-CM | POA: Diagnosis present

## 2020-07-25 DIAGNOSIS — Z86010 Personal history of colonic polyps: Secondary | ICD-10-CM | POA: Diagnosis not present

## 2020-07-25 DIAGNOSIS — E870 Hyperosmolality and hypernatremia: Secondary | ICD-10-CM | POA: Diagnosis not present

## 2020-07-25 DIAGNOSIS — M4854XA Collapsed vertebra, not elsewhere classified, thoracic region, initial encounter for fracture: Secondary | ICD-10-CM | POA: Diagnosis present

## 2020-07-25 DIAGNOSIS — F05 Delirium due to known physiological condition: Secondary | ICD-10-CM | POA: Diagnosis present

## 2020-07-25 DIAGNOSIS — R1310 Dysphagia, unspecified: Secondary | ICD-10-CM | POA: Diagnosis not present

## 2020-07-25 DIAGNOSIS — S065XAA Traumatic subdural hemorrhage with loss of consciousness status unknown, initial encounter: Secondary | ICD-10-CM | POA: Diagnosis present

## 2020-07-25 DIAGNOSIS — R262 Difficulty in walking, not elsewhere classified: Secondary | ICD-10-CM | POA: Diagnosis present

## 2020-07-25 DIAGNOSIS — Z7189 Other specified counseling: Secondary | ICD-10-CM | POA: Diagnosis not present

## 2020-07-25 DIAGNOSIS — Z9049 Acquired absence of other specified parts of digestive tract: Secondary | ICD-10-CM | POA: Diagnosis not present

## 2020-07-25 DIAGNOSIS — F0280 Dementia in other diseases classified elsewhere without behavioral disturbance: Secondary | ICD-10-CM | POA: Diagnosis present

## 2020-07-25 DIAGNOSIS — E039 Hypothyroidism, unspecified: Secondary | ICD-10-CM | POA: Diagnosis present

## 2020-07-25 DIAGNOSIS — R531 Weakness: Secondary | ICD-10-CM

## 2020-07-25 DIAGNOSIS — E860 Dehydration: Secondary | ICD-10-CM

## 2020-07-25 DIAGNOSIS — M810 Age-related osteoporosis without current pathological fracture: Secondary | ICD-10-CM | POA: Diagnosis present

## 2020-07-25 DIAGNOSIS — G40909 Epilepsy, unspecified, not intractable, without status epilepticus: Secondary | ICD-10-CM | POA: Diagnosis present

## 2020-07-25 DIAGNOSIS — Z79899 Other long term (current) drug therapy: Secondary | ICD-10-CM | POA: Diagnosis not present

## 2020-07-25 DIAGNOSIS — K5900 Constipation, unspecified: Secondary | ICD-10-CM | POA: Diagnosis present

## 2020-07-25 DIAGNOSIS — J690 Pneumonitis due to inhalation of food and vomit: Secondary | ICD-10-CM | POA: Diagnosis present

## 2020-07-25 DIAGNOSIS — Z96642 Presence of left artificial hip joint: Secondary | ICD-10-CM | POA: Diagnosis present

## 2020-07-25 DIAGNOSIS — Z09 Encounter for follow-up examination after completed treatment for conditions other than malignant neoplasm: Secondary | ICD-10-CM | POA: Diagnosis not present

## 2020-07-25 DIAGNOSIS — Z66 Do not resuscitate: Secondary | ICD-10-CM | POA: Diagnosis present

## 2020-07-25 DIAGNOSIS — L89101 Pressure ulcer of unspecified part of back, stage 1: Secondary | ICD-10-CM | POA: Diagnosis present

## 2020-07-25 DIAGNOSIS — E079 Disorder of thyroid, unspecified: Secondary | ICD-10-CM | POA: Diagnosis present

## 2020-07-25 DIAGNOSIS — Z85828 Personal history of other malignant neoplasm of skin: Secondary | ICD-10-CM | POA: Diagnosis not present

## 2020-07-25 DIAGNOSIS — D580 Hereditary spherocytosis: Secondary | ICD-10-CM | POA: Diagnosis present

## 2020-07-25 DIAGNOSIS — Z515 Encounter for palliative care: Secondary | ICD-10-CM | POA: Diagnosis not present

## 2020-07-25 DIAGNOSIS — Z20822 Contact with and (suspected) exposure to covid-19: Secondary | ICD-10-CM | POA: Diagnosis present

## 2020-07-25 LAB — CK: Total CK: 48 U/L — ABNORMAL LOW (ref 49–397)

## 2020-07-25 LAB — VALPROIC ACID LEVEL: Valproic Acid Lvl: 32 ug/mL — ABNORMAL LOW (ref 50.0–100.0)

## 2020-07-25 LAB — TSH: TSH: 0.381 u[IU]/mL (ref 0.350–4.500)

## 2020-07-25 MED ORDER — IOHEXOL 350 MG/ML SOLN
100.0000 mL | Freq: Once | INTRAVENOUS | Status: AC | PRN
  Administered 2020-07-25: 80 mL via INTRAVENOUS

## 2020-07-25 MED ORDER — ACETAMINOPHEN 325 MG PO TABS
650.0000 mg | ORAL_TABLET | Freq: Once | ORAL | Status: AC
  Administered 2020-07-25: 650 mg via ORAL
  Filled 2020-07-25: qty 2

## 2020-07-25 MED ORDER — DIVALPROEX SODIUM 125 MG PO CSDR
250.0000 mg | DELAYED_RELEASE_CAPSULE | Freq: Two times a day (BID) | ORAL | Status: DC
  Administered 2020-07-25 – 2020-07-29 (×9): 250 mg via ORAL
  Filled 2020-07-25 (×11): qty 2

## 2020-07-25 MED ORDER — FOLIC ACID 1 MG PO TABS
1.0000 mg | ORAL_TABLET | Freq: Every day | ORAL | Status: DC
  Administered 2020-07-25 – 2020-07-28 (×4): 1 mg via ORAL
  Filled 2020-07-25 (×6): qty 1

## 2020-07-25 MED ORDER — LORAZEPAM 0.5 MG PO TABS
0.2500 mg | ORAL_TABLET | Freq: Three times a day (TID) | ORAL | Status: DC | PRN
  Administered 2020-07-25 – 2020-07-28 (×4): 0.25 mg via ORAL
  Filled 2020-07-25 (×4): qty 1

## 2020-07-25 MED ORDER — MELATONIN 3 MG PO TABS
3.0000 mg | ORAL_TABLET | Freq: Every day | ORAL | Status: DC
  Administered 2020-07-25 – 2020-07-29 (×5): 3 mg via ORAL
  Filled 2020-07-25 (×5): qty 1

## 2020-07-25 MED ORDER — LIFE PACK MENS PO MISC: 1.0000 | ORAL | Status: DC

## 2020-07-25 MED ORDER — SODIUM CHLORIDE 0.9 % IV SOLN: INTRAVENOUS | Status: AC

## 2020-07-25 MED ORDER — LIDOCAINE 5 % EX PTCH
1.0000 | MEDICATED_PATCH | Freq: Every day | CUTANEOUS | Status: DC | PRN
  Administered 2020-07-27: 20:00:00 1 via TRANSDERMAL
  Filled 2020-07-25 (×2): qty 1

## 2020-07-25 MED ORDER — DIVALPROEX SODIUM 125 MG PO CSDR: 125.0000 mg | DELAYED_RELEASE_CAPSULE | ORAL | Status: DC

## 2020-07-25 MED ORDER — LEVETIRACETAM 100 MG/ML PO SOLN
500.0000 mg | Freq: Two times a day (BID) | ORAL | Status: DC
  Administered 2020-07-25 – 2020-07-29 (×9): 500 mg via ORAL
  Filled 2020-07-25 (×12): qty 5

## 2020-07-25 MED ORDER — ADULT MULTIVITAMIN LIQUID CH
15.0000 mL | Freq: Every day | ORAL | Status: DC
  Administered 2020-07-25 – 2020-07-29 (×5): 15 mL via ORAL
  Filled 2020-07-25 (×7): qty 15

## 2020-07-25 MED ORDER — ENSURE ENLIVE PO LIQD
237.0000 mL | Freq: Three times a day (TID) | ORAL | Status: DC
  Administered 2020-07-25 – 2020-07-28 (×8): 237 mL via ORAL
  Filled 2020-07-25 (×3): qty 237

## 2020-07-25 MED ORDER — LEVOTHYROXINE SODIUM 25 MCG PO TABS
125.0000 ug | ORAL_TABLET | Freq: Every day | ORAL | Status: DC
  Administered 2020-07-26 – 2020-07-30 (×5): 125 ug via ORAL
  Filled 2020-07-25 (×5): qty 1

## 2020-07-25 MED ORDER — DIVALPROEX SODIUM 125 MG PO CSDR
125.0000 mg | DELAYED_RELEASE_CAPSULE | Freq: Every day | ORAL | Status: DC
  Administered 2020-07-25 – 2020-07-29 (×5): 125 mg via ORAL
  Filled 2020-07-25 (×6): qty 1

## 2020-07-25 MED ORDER — ACETAMINOPHEN 325 MG PO TABS
650.0000 mg | ORAL_TABLET | Freq: Four times a day (QID) | ORAL | Status: DC | PRN
  Administered 2020-07-25 – 2020-07-27 (×4): 650 mg via ORAL
  Filled 2020-07-25 (×4): qty 2

## 2020-07-25 MED ORDER — SODIUM CHLORIDE (PF) 0.9 % IJ SOLN
INTRAMUSCULAR | Status: AC
  Filled 2020-07-25: qty 50

## 2020-07-25 MED ORDER — ACETAMINOPHEN 650 MG RE SUPP: 650.0000 mg | Freq: Four times a day (QID) | RECTAL | Status: DC | PRN

## 2020-07-25 NOTE — ED Notes (Signed)
Pt to MRI via stretcher.

## 2020-07-25 NOTE — ED Notes (Signed)
Pt report called to Bri. Pt to floor via stretcher.

## 2020-07-25 NOTE — H&P (Signed)
History and Physical    Eric Larsen EQA:[id number] DOB: 04/29/1931 DOA: 07/24/2020  PCP: [name], MD  Patient coming from: Dementia unit.  History obtained from patient's wife.  Patient has dementia.  Chief Complaint: Weakness.  Difficulty walking.  HPI: Eric Larsen is a 86 y.o. 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  Per patient's wife patient did not lose consciousness and did not hit his head.  Since the Covid vaccination booster he got he has been feeling more weak and has been finding it difficult to walk.  Over the last 6 weeks patient also has been having mid back pain for which patient was started on Lidoderm patch.  No nausea vomiting diarrhea chest pain or shortness of breath.  ED Course: In the ER patient had temperature of 99 F no neck stiffness moving all extremities following commands alert awake generally weak urine looks discolored but UA does not show any definite infection Covid test was negative EKG shows normal sinus rhythm with RBBB.  Since patient was complaining of some abdominal discomfort and CT chest abdomen pelvis was done which shows T1 and and T2 age-indeterminate compression fractures.  CT head was done which showed a small subdural hematoma.  Patient admitted for further management.  Review of Systems: As per HPI, rest all negative.   Past Medical History:  Diagnosis Date  . Cognitive decline   . Hypothyroidism   . Osteoporosis   . Thyroid disease     Past Surgical History:  Procedure Laterality Date  . CHOLECYSTECTOMY    . HIP FRACTURE SURGERY    . INTRAMEDULLARY (IM) NAIL INTERTROCHANTERIC Right 08/13/2017   Procedure: INTRAMEDULLARY (IM) NAIL INTERTROCHANTRIC;  Surgeon:  Earnestine Leys, MD;  Location: [facility] ORS;  Service: Orthopedics;  Laterality: Right;  . SHOULDER SURGERY       reports that he has quit smoking. He has never used smokeless tobacco. He reports previous alcohol use. He reports that he does not use drugs.  Allergies  Allergen Reactions  . Pollen Extract Itching and Other (See Comments)    On MAR- Itchy eyes, runny nose, nasal congestion    Family History  Problem Relation Age of Onset  . Hereditary spherocytosis Father   . Hereditary spherocytosis Sister     Prior to Admission medications   Medication Sig Start Date End Date Taking? Authorizing Provider  divalproex (DEPAKOTE SPRINKLE) 125 MG capsule Take 125-250 mg by mouth See admin instructions. Take 250 mg by mouth in the morning, 125 mg at 2 PM, and 250 mg at bedtime   Yes [provider]  feeding supplement, ENSURE ENLIVE, (ENSURE ENLIVE) LIQD Take 237 mLs by mouth 3 (three) times daily between meals. 05/25/20  Yes Regalado, Belkys A, MD  folic acid (FOLVITE) 1 MG tablet Take 1 mg by mouth daily.   Yes [provider]  levETIRAcetam (KEPPRA) 100 MG/ML solution Take 500 mg by mouth 2 (two) times daily.   Yes [provider]  levothyroxine (SYNTHROID) 125 MCG tablet Take 1 tablet (125 mcg total) by mouth daily at 6 (six) AM. 11/17/19  Yes Angiulli, Lavon Paganini, PA-C  lidocaine (LIDODERM) 5 % Place 1 patch onto the skin daily. Remove & Discard patch within 12 hours  or as directed by MD Patient taking differently: Place 1 patch onto the skin daily as needed (for pain- Remove & Discard patch after 12 hours). 11/16/19  Yes Angiulli, Lavon Paganini, PA-C  Multiple Vitamins-Minerals (LIFE PACK MENS) MISC Take 1 each by mouth See admin instructions. Mix 1 packet into 8 ounces of water and drink once daily at 2 PM   Yes [provider]  acetaminophen (TYLENOL) 325 MG tablet Take 325 mg by mouth every 6 (six) hours as needed (for pain or fever).     [provider]   ibandronate (BONIVA) 150 MG tablet Take 150 mg by mouth every 30 (thirty) days. Take in the morning with a full glass of water, on an empty stomach, and do not take anything else by mouth or lie down for the next 30 min.    [provider]  LORazepam (ATIVAN) 0.5 MG tablet Take 0.5 tablets (0.25 mg total) by mouth every 8 (eight) hours as needed (for agitation). 05/25/20   Regalado, Belkys A, MD  melatonin 3 MG TABS tablet Take 1 tablet (3 mg total) by mouth at bedtime. 11/16/19   Angiulli, Lavon Paganini, PA-C  nystatin (MYCOSTATIN) 100000 UNIT/ML suspension Take 5 mLs (500,000 Units total) by mouth 4 (four) times daily. 05/25/20   Regalado, Cassie Freer, MD    Physical Exam: Constitutional: Moderately built and nourished. Vitals:   07/25/20 0215 07/25/20 0230 07/25/20 0330 07/25/20 0530  BP: 138/64 108/72 117/68 131/80  Pulse: 83 82 79 77  Resp:  20  14  Temp:      TempSrc:      SpO2: 90% 94% 96% 96%  Weight:      Height:       Eyes: Anicteric no pallor. ENMT: No discharge from the ears eyes nose or mouth. Neck: No neck rigidity. Respiratory: No rhonchi or crepitations. Cardiovascular: S1-S2 heard. Abdomen: Soft nontender bowel sounds present. Musculoskeletal: No edema. Skin: No rash. Neurologic: Alert awake oriented to his name.  Moving all extremities.  Generally weak. Psychiatric: Has dementia.   Labs on Admission: I have personally reviewed following labs and imaging studies  CBC: Recent Labs  Lab 07/24/20 2213  WBC 16.1*  NEUTROABS 12.5*  HGB 13.8  HCT 40.4  MCV 100.0  PLT 009   Basic Metabolic Panel: Recent Labs  Lab 07/24/20 2213  NA 142  K 4.3  CL 104  CO2 27  GLUCOSE 143*  BUN 29*  CREATININE 0.85  CALCIUM 9.6   GFR: Estimated Creatinine Clearance: 46.3 mL/min (by C-G formula based on SCr of 0.85 mg/dL). Liver Function Tests: Recent Labs  Lab 07/24/20 2213  AST 27  ALT 17  ALKPHOS 58  BILITOT 3.2*  PROT 7.4  ALBUMIN 4.0   No results for  input(s): LIPASE, AMYLASE in the last 168 hours. No results for input(s): AMMONIA in the last 168 hours. Coagulation Profile: No results for input(s): INR, PROTIME in the last 168 hours. Cardiac Enzymes: No results for input(s): CKTOTAL, CKMB, CKMBINDEX, TROPONINI in the last 168 hours. BNP (last 3 results) No results for input(s): PROBNP in the last 8760 hours. HbA1C: No results for input(s): HGBA1C in the last 72 hours. CBG: Recent Labs  Lab 07/24/20 2220  GLUCAP 133*   Lipid Profile: No results for input(s): CHOL, HDL, LDLCALC, TRIG, CHOLHDL, LDLDIRECT in the last 72 hours. Thyroid Function Tests: No results for input(s): TSH, T4TOTAL, FREET4, T3FREE, THYROIDAB in the last 72 hours. Anemia Panel: No results for input(s):  VITAMINB12, FOLATE, FERRITIN, TIBC, IRON, RETICCTPCT in the last 72 hours. Urine analysis:    Component Value Date/Time   COLORURINE YELLOW 07/24/2020 [place] 07/24/2020 2212   LABSPEC 1.020 07/24/2020 2212   PHURINE 6.0 07/24/2020 2212   GLUCOSEU NEGATIVE 07/24/2020 2212   HGBUR NEGATIVE 07/24/2020 2212   BILIRUBINUR NEGATIVE 07/24/2020 2212   KETONESUR 20 (A) 07/24/2020 2212   PROTEINUR NEGATIVE 07/24/2020 2212   NITRITE NEGATIVE 07/24/2020 2212   LEUKOCYTESUR NEGATIVE 07/24/2020 2212   Sepsis Labs: @LABRCNTIP (procalcitonin:4,lacticidven:4) ) Recent Results (from the past 240 hour(s))  Resp Panel by RT-PCR (Flu A&B, Covid) Nasopharyngeal Swab     Status: None   Collection Time: 07/24/20 10:13 PM   Specimen: Nasopharyngeal Swab; Nasopharyngeal(NP) swabs in vial transport medium  Result Value Ref Range Status   SARS Coronavirus 2 by RT PCR NEGATIVE NEGATIVE Final    Comment: (NOTE) SARS-CoV-2 target nucleic acids are NOT DETECTED.  The SARS-CoV-2 RNA is generally detectable in upper respiratory specimens during the acute phase of infection. The lowest concentration of SARS-CoV-2 viral copies this assay can detect is 138  copies/mL. A negative result does not preclude SARS-Cov-2 infection and should not be used as the sole basis for treatment or other patient management decisions. A negative result may occur with  improper specimen collection/handling, submission of specimen other than nasopharyngeal swab, presence of viral mutation(s) within the areas targeted by this assay, and inadequate number of viral copies(<138 copies/mL). A negative result must be combined with clinical observations, patient history, and epidemiological information. The expected result is Negative.  Fact Sheet for Patients:  EntrepreneurPulse.com.au  Fact Sheet for Healthcare Providers:  IncredibleEmployment.be  This test is no t yet approved or cleared by the [place] FDA and  has been authorized for detection and/or diagnosis of SARS-CoV-2 by FDA under an Emergency Use Authorization (EUA). This EUA will remain  in effect (meaning this test can be used) for the duration of the COVID-19 declaration under Section 564(b)(1) of the Act, 21 U.S.C.section 360bbb-3(b)(1), unless the authorization is terminated  or revoked sooner.       Influenza A by PCR NEGATIVE NEGATIVE Final   Influenza B by PCR NEGATIVE NEGATIVE Final    Comment: (NOTE) The Xpert Xpress SARS-CoV-2/FLU/RSV plus assay is intended as an aid in the diagnosis of influenza from Nasopharyngeal swab specimens and should not be used as a sole basis for treatment. Nasal washings and aspirates are unacceptable for Xpert Xpress SARS-CoV-2/FLU/RSV testing.  Fact Sheet for Patients: EntrepreneurPulse.com.au  Fact Sheet for Healthcare Providers: IncredibleEmployment.be  This test is not yet approved or cleared by the [place] FDA and has been authorized for detection and/or diagnosis of SARS-CoV-2 by FDA under an Emergency Use Authorization (EUA). This EUA will remain in effect (meaning  this test can be used) for the duration of the COVID-19 declaration under Section 564(b)(1) of the Act, 21 U.S.C. section 360bbb-3(b)(1), unless the authorization is terminated or revoked.  Performed at [facility], [place] [address] [place], [place] [address]      Radiological Exams on Admission: CT HEAD WO CONTRAST  Result Date: 07/25/2020 CLINICAL DATA:  Fall 07/22/2020 EXAM: CT HEAD WITHOUT CONTRAST TECHNIQUE: Contiguous axial images were obtained from the base of the skull through the vertex without intravenous contrast. COMPARISON:  03/30/2020 FINDINGS: Brain: Significant brain atrophy, especially at the temporal lobes where there is asymmetric gliosis on the left. The gliosis is medial and anterior and could be posttraumatic  or post ischemic. Trace (3 mm in thickness) right parafalcine thickening of the posterior more than anterior falx, only convincing for hematoma given the comparison. No infarct, obstructive hydrocephalus, or masslike finding. Vascular: Atheromatous calcification Skull: No acute or aggressive finding. Trabecular coarsening over the greater wing sphenoid and squamosal left temporal bone which could be from fibrous dysplasia or Paget's disease. Sinuses/Orbits: Bilateral cataract resection Critical Value/emergent results were called by telephone at the time of interpretation on 07/25/2020 at 5:39 am to provider [facility] , who verbally acknowledged these results. IMPRESSION: 1. Trace falcine subdural hematoma, only convincing given a recent comparison. 2. Brain atrophy especially prominent the temporal lobes. 3. Remote infarct or contusion at the left temporal lobe. Electronically Signed   By: Monte Fantasia M.D.   On: 07/25/2020 06:02   DG Chest Portable 1 View  Result Date: 07/24/2020 CLINICAL DATA:  86 year old male with cough and confusion. Weakness, fever starting today. EXAM: PORTABLE CHEST 1 VIEW COMPARISON:  Chest radiographs 05/24/2020 and  earlier. FINDINGS: Portable AP semi upright view at 2334 hours. Stable lung volumes and mediastinal contours. Allowing for portable technique the lungs are clear. No pneumothorax. Visible bowel gas pattern is within normal limits. Stable visualized osseous structures, including previous left lateral rib fractures, left humerus ORIF. Stable cholecystectomy clips. IMPRESSION: No acute cardiopulmonary abnormality. Electronically Signed   By: Genevie Ann M.D.   On: 07/24/2020 23:51   CT Angio Chest/Abd/Pel for Dissection W and/or Wo Contrast  Result Date: 07/25/2020 CLINICAL DATA:  Abdominal pain. Concern for aortic dissection. Weakness for 2 days. EXAM: CT ANGIOGRAPHY CHEST, ABDOMEN AND PELVIS TECHNIQUE: Non-contrast CT of the chest was initially obtained. Multidetector CT imaging through the chest, abdomen and pelvis was performed using the standard protocol during bolus administration of intravenous contrast. Multiplanar reconstructed images and MIPs were obtained and reviewed to evaluate the vascular anatomy. CONTRAST:  38mL OMNIPAQUE IOHEXOL 350 MG/ML SOLN COMPARISON:  CT chest dated 08/16/2017. FINDINGS: CTA CHEST FINDINGS Cardiovascular: The heart size is mildly enlarged. There are coronary artery calcifications. Moderate atherosclerotic changes are noted of the thoracic aorta without evidence for dissection or aneurysm. The arch vessels are grossly patent where visualized. There is no large centrally located pulmonary embolism. Mediastinum/Nodes: -- No mediastinal lymphadenopathy. -- No hilar lymphadenopathy. -- No axillary lymphadenopathy. -- No supraclavicular lymphadenopathy. -- Normal thyroid gland where visualized. -  Unremarkable esophagus. Lungs/Pleura: Mild emphysematous changes are noted bilaterally. There is atelectasis at the lung bases, right greater than left. There is no pneumothorax or large pleural effusion. The trachea is unremarkable. Musculoskeletal: There is age-indeterminate height loss of  the T1 and T2 vertebral bodies, new since prior study in 2018. Additional chronic appearing height loss is noted of multiple vertebral bodies throughout the thoracic spine. There is an old healed fracture of the sternum. Review of the MIP images confirms the above findings. CTA ABDOMEN AND PELVIS FINDINGS VASCULAR Aorta: There are atherosclerotic changes throughout the abdominal aorta without evidence for an aneurysm or dissection. 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 SMA: There is moderate narrowing of the proximal SMA. Renals: There is moderate narrowing of both renal arteries. IMA: Patent without evidence of aneurysm, dissection, vasculitis or significant stenosis. Inflow: Patent without evidence of aneurysm, dissection, vasculitis or significant stenosis. Veins: No obvious venous abnormality within the limitations of this arterial phase study. Review of the MIP images confirms the above findings. NON-VASCULAR Hepatobiliary: The liver is normal. Status post cholecystectomy.There  is mild extrahepatic and intrahepatic biliary ductal dilatation. Pancreas: Normal contours without ductal dilatation. No peripancreatic fluid collection. Spleen: Spleen is borderline enlarged measuring 12 cm craniocaudad. Adrenals/Urinary Tract: --Adrenal glands: Unremarkable. --Right kidney/ureter: No hydronephrosis or radiopaque kidney stones. --Left kidney/ureter: There is an indeterminate exophytic lesion involving the posterior interpolar region of the left kidney measuring approximately 1.8 cm and 42 Hounsfield units. --Urinary bladder: The urinary bladder cannot be well evaluated secondary to extensive streak artifact. Stomach/Bowel: --Stomach/Duodenum: No hiatal hernia or other gastric abnormality. Normal duodenal course and caliber. --Small bowel: Unremarkable. --Colon: There is a large amount of stool throughout the colon, especially in the rectum and  cecum. --Appendix: Not visualized. No right lower quadrant inflammation or free fluid. Lymphatic: Normal course and caliber of the major abdominal vessels. --No retroperitoneal lymphadenopathy. --No mesenteric lymphadenopathy. --No pelvic or inguinal lymphadenopathy. Reproductive: The prostate gland is enlarged. Other: No ascites or free air. The abdominal wall is normal. Musculoskeletal. There is chronic appearing height loss of multiple lumbar vertebral bodies including the L2, L3, and L4 vertebral bodies. There is no definite acute compression fracture identified on today's study. The patient is status post prior total hip arthroplasty on the left and intramedullary nailing of the right femur. There is a sclerotic lesion in the right iliac bone. Review of the MIP images confirms the above findings. IMPRESSION: 1. No evidence for aortic dissection. No evidence for large centrally located pulmonary embolism. 2. Age-indeterminate height loss of the T1 and T2 vertebral bodies, new since prior study in 2018. Additional chronic appearing height loss is noted of multiple vertebral bodies throughout the thoracic and lumbar spine. Diffuse osteopenia is noted. 3. There is a large amount of stool throughout the colon, especially in the rectum and cecum. Correlate for constipation. 4. Indeterminate exophytic lesion involving the posterior interpolar region of the left kidney measuring approximately 1.8 cm and 42 Hounsfield units. While this may represent a hemorrhagic or proteinaceous cyst, a solid mass is not excluded. Follow-up with a nonemergent outpatient renal ultrasound is recommended for further evaluation. 5. Cardiomegaly and coronary artery calcifications. 6. Mild intrahepatic and extrahepatic biliary ductal dilatation. Correlation with laboratory studies is recommended. Aortic Atherosclerosis (ICD10-I70.0) and Emphysema (ICD10-J43.9). Electronically Signed   By: Constance Holster M.D.   On: 07/25/2020 03:01     EKG: Independently reviewed.  Normal sinus rhythm RBBB.  Assessment/Plan Principal Problem:   Weakness generalized Active Problems:   Primary hypothyroidism   Hypothyroidism   Dementia associated with other underlying disease without behavioral disturbance (HCC)   Subdural hematoma (HCC)   Weakness    1. Generalized weakness could be from dehydration.  Patient's urine does look discolored but no definite signs of any infection.  We will gently hydrate and get physical therapy consult.  Check TSH and CK levels. 2. Subdural hematoma -patient CT head does show a small subdural hematoma for which I have consulted neurosurgery.  We will follow their recommendations. 3. Mid back pain with CT chest was showing T1 and 2 fracture.  Patient's wife states that over the last 6 weeks he has been having increasing severe pain in the mid back.  Will get MRI of the T-spine. 4. Leukocytosis will check blood cultures follow MRI of the T-spine to make sure there is no infection. 5. Hypothyroidism on Synthroid.  Check TSH. 6. History of seizures on Keppra. 7. History of dementia presently on Depakote.  Will check Depakote levels. 8. Admitted for HSV encephalitis in February of this year.  DVT prophylaxis: SCDs.  Avoiding anticoagulation since patient is subdural hematoma. Code Status: DNR confirmed with patient's wife. Family Communication: Patient's wife. Disposition Plan: Back to facility when stable. Consults called: Neurosurgery. Admission status: Observation.   Rise Patience MD [facility] Pager [phone].  If 7PM-7AM, please contact night-coverage [email or web address] Password [facility]  07/25/2020, 6:37 AM

## 2020-07-25 NOTE — Progress Notes (Signed)
We were called about this pt's CT head. There is minimal thickening of the posterior falx that potentially could be a tiny amount of subdural blood on the falx. This is clinically insignificant and needs no further work up or treatment. CT of spine shows diffuse osteoporosis and multiple age indeterminant compression deformities and severe kyphosis. Let us know if the MRI shows acuity of any compression and we can consider bracing but he is in no way a surgical candidate (would not consider kyphoplasty)

## 2020-07-25 NOTE — Progress Notes (Signed)
   Patient seen and examined at bedside, patient admitted after midnight, please see earlier detailed admission note by Rise Patience, MD. Briefly, patient presented secondary to weakness/difficulty walking. He was found to have a subdural hematoma and acute T10 compression fracture. IV fluids ordered. Neurosurgery with no recommendations for surgical management.  Subjective: No back pain. No concerns today.  BP (!) 156/60   Pulse 68   Temp 99.8 F (37.7 C) (Rectal)   Resp (!) 29   Ht 5\' 3"  (1.6 m)   Wt 53.5 kg   SpO2 94%   BMI 20.90 kg/m   General exam: Appears calm and comfortable Respiratory system: Clear to auscultation. Respiratory effort normal. Cardiovascular system: S1 & S2 heard, RRR. No murmurs, rubs, gallops or clicks. Gastrointestinal system: Abdomen is nondistended, soft and nontender. No organomegaly or masses felt. Normal bowel sounds heard. Central nervous system: Alert and oriented. No focal neurological deficits. Musculoskeletal: No edema. No calf tenderness Skin: No cyanosis. No rashes Psychiatry: Judgement and insight appear normal. Mood & affect appropriate.   Brief assessment/Plan:  Generalized weakness Possibly from dehydration. Patient started on IV fluids -Continue IV fluids -PT/OT eval  Subdural hematoma Evaluated by neurosurgery with recommendations for no follow-up  Back pain MRI thoracic spine significant for acute thoracic compression fracture -TLSO brace  Leukocytosis Unsure of etiology.  Hypothyroidism -Continue Synthroid  Seizure disorder -Continue Keppra and Depakote   Family communication: None at bedside DVT prophylaxis: SCDs Disposition: Discharge home vs SNF likely in 24 hours pending PT/OT recommendations  Cordelia Poche, MD [facility] 07/25/2020, 4:07 PM

## 2020-07-25 NOTE — ED Notes (Signed)
Pt incontinent of urine. Skin care provided and linen changed. Condom cath applied for comfort. Bed alarm in place and turned on. Call light in reach.

## 2020-07-26 ENCOUNTER — Inpatient Hospital Stay (HOSPITAL_COMMUNITY): Payer: Medicare Other

## 2020-07-26 LAB — DIFFERENTIAL
Abs Immature Granulocytes: 0.32 10*3/uL — ABNORMAL HIGH (ref 0.00–0.07)
Basophils Absolute: 0.1 10*3/uL (ref 0.0–0.1)
Basophils Relative: 0 %
Eosinophils Absolute: 0 10*3/uL (ref 0.0–0.5)
Eosinophils Relative: 0 %
Immature Granulocytes: 1 %
Lymphocytes Relative: 6 %
Lymphs Abs: 2 10*3/uL (ref 0.7–4.0)
Monocytes Absolute: 2.4 10*3/uL — ABNORMAL HIGH (ref 0.1–1.0)
Monocytes Relative: 7 %
Neutro Abs: 27.2 10*3/uL — ABNORMAL HIGH (ref 1.7–7.7)
Neutrophils Relative %: 86 %

## 2020-07-26 LAB — COMPREHENSIVE METABOLIC PANEL
ALT: 16 U/L (ref 0–44)
AST: 23 U/L (ref 15–41)
Albumin: 3.2 g/dL — ABNORMAL LOW (ref 3.5–5.0)
Alkaline Phosphatase: 53 U/L (ref 38–126)
Anion gap: 9 (ref 5–15)
BUN: 24 mg/dL — ABNORMAL HIGH (ref 8–23)
CO2: 25 mmol/L (ref 22–32)
Calcium: 8.9 mg/dL (ref 8.9–10.3)
Chloride: 108 mmol/L (ref 98–111)
Creatinine, Ser: 0.83 mg/dL (ref 0.61–1.24)
GFR, Estimated: >60 mL/min (ref >60)
Glucose, Bld: 149 mg/dL — ABNORMAL HIGH (ref 70–99)
Potassium: 3.9 mmol/L (ref 3.5–5.1)
Sodium: 142 mmol/L (ref 135–145)
Total Bilirubin: 2.8 mg/dL — ABNORMAL HIGH (ref 0.3–1.2)
Total Protein: 6.2 g/dL — ABNORMAL LOW (ref 6.5–8.1)

## 2020-07-26 LAB — CBC
HCT: 37.2 % — ABNORMAL LOW (ref 39.0–52.0)
Hemoglobin: 12.4 g/dL — ABNORMAL LOW (ref 13.0–17.0)
MCH: 33.5 pg (ref 26.0–34.0)
MCHC: 33.3 g/dL (ref 30.0–36.0)
MCV: 100.5 fL — ABNORMAL HIGH (ref 80.0–100.0)
Platelets: 187 10*3/uL (ref 150–400)
RBC: 3.7 MIL/uL — ABNORMAL LOW (ref 4.22–5.81)
RDW: 21 % — ABNORMAL HIGH (ref 11.5–15.5)
WBC: 32.3 10*3/uL — ABNORMAL HIGH (ref 4.0–10.5)
nRBC: 0 % (ref 0.0–0.2)

## 2020-07-26 LAB — URINE CULTURE: Culture: <10000 — AB

## 2020-07-26 LAB — PROCALCITONIN: Procalcitonin: 1.08 ng/mL

## 2020-07-26 MED ORDER — VANCOMYCIN HCL IN DEXTROSE 1-5 GM/200ML-% IV SOLN
1000.0000 mg | Freq: Once | INTRAVENOUS | Status: AC
  Administered 2020-07-26: 1000 mg via INTRAVENOUS
  Filled 2020-07-26: qty 200

## 2020-07-26 MED ORDER — VANCOMYCIN HCL 750 MG/150ML IV SOLN
750.0000 mg | INTRAVENOUS | Status: DC
  Administered 2020-07-26 – 2020-07-27 (×2): 750 mg via INTRAVENOUS
  Filled 2020-07-26 (×3): qty 150

## 2020-07-26 MED ORDER — SODIUM CHLORIDE 0.9 % IV SOLN
2.0000 g | Freq: Two times a day (BID) | INTRAVENOUS | Status: DC
  Administered 2020-07-26 – 2020-07-28 (×4): 2 g via INTRAVENOUS
  Filled 2020-07-26 (×3): qty 2
  Filled 2020-07-26 (×2): qty 0.07

## 2020-07-26 NOTE — Plan of Care (Signed)
  Problem: Activity: Goal: Risk for activity intolerance will decrease Outcome: Progressing   Problem: Safety: Goal: Ability to remain free from injury will improve Outcome: Progressing   Problem: Skin Integrity: Goal: Risk for impaired skin integrity will decrease Outcome: Progressing   

## 2020-07-26 NOTE — Progress Notes (Addendum)
Pharmacy Antibiotic Note  Eric Larsen is a 86 y.o. male admitted on 07/24/2020 with T10 compression fracture and weakness. No MD note this AM but WBC significantly higher and Pharmacy has been consulted for vancomycin and cefepime dosing.  Plan:  Vancomycin 1000 mg IV now, then 750 mg IV q24 hr  Measure vancomycin AUC at steady state as indicated  SCr q48 while on vanc  Cefepime 2 g IV q12 hr - f/u peripherally and adjust per standing abx renal adjustment protocol   Height: 5\' 3"  (160 cm) Weight: 53.5 kg (118 lb) IBW/kg (Calculated) : 56.9  Temp (24hrs), Avg:98.8 F (37.1 C), Min:98 F (36.7 C), Max:100 F (37.8 C)  Recent Labs  Lab 07/24/20 2213 07/26/20 0605  WBC 16.1* 32.3*  CREATININE 0.85 0.83    Estimated Creatinine Clearance: 47.4 mL/min (by C-G formula based on SCr of 0.83 mg/dL).    Allergies  Allergen Reactions  . Pollen Extract Itching and Other (See Comments)    On MAR- Itchy eyes, runny nose, nasal congestion    Antimicrobials this admission: 12/10 vancomycin >>  12/10 cefepime >>   Dose adjustments this admission: n/a  Microbiology results: 12/9 BCx: sent 12/10 UCx: sent   Thank you for allowing pharmacy to be a part of this patient's care.  Kelseigh Diver A 07/26/2020 8:01 AM

## 2020-07-26 NOTE — Progress Notes (Signed)
Occupational Therapy Evaluation  Patient from memory care facility, required assist for dressing/bathing and would feed himself with encouragement. Patient utilized rollator for ambulation. Currently patient require max A for bed mobility, mod A to power up to standing and min A with increased time and max multimodal cues to take few steps from EOB to recliner. Recommend continued acute OT services to maximize patient activity tolerance, balance, safety to reduce caregiver burden in order to facilitate D/C to venue listed below.     07/26/20 1500  OT Visit Information  Last OT Received On 07/26/20  Assistance Needed +2 (to progress)  History of Present Illness 86 y.o. 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 Patient fell while going to get COVID booster on 12/6 and since has had increased weakness. Patient also with ~6 week hx of increased back pain with MR of spine showing mild T10 anterior inferior endplate compression fracture.  Precautions  Precautions Fall  Precaution Comments T10 mild compression fx  Restrictions  Weight Bearing Restrictions No  Home Living  Family/patient expects to be discharged to: Skilled nursing facility  Prior Function  Level of Independence Needs assistance  Gait / Transfers Assistance Needed per wife pt amb with rollator at [place] / [place] feeds self per wife but requires encouragement, assist with ADLs from wife or staff  Communication  Communication No difficulties  Pain Assessment  Pain Assessment Faces  Faces Pain Scale 4  Pain Location back with bed mobility  Pain Descriptors / Indicators Discomfort;Grimacing  Pain Intervention(s) Monitored during session  Cognition  Arousal/Alertness Awake/alert  Behavior During Therapy [facility] for tasks assessed/performed  Overall Cognitive Status History of cognitive impairments -  at baseline  General Comments occasional word finding difficulty. follows one step commands with increased time. pleasant and agreeable  Upper Extremity Assessment  Upper Extremity Assessment Generalized weakness  Lower Extremity Assessment  Lower Extremity Assessment Defer to PT evaluation  Cervical / Trunk Assessment  Cervical / Trunk Assessment Kyphotic  ADL  Overall ADL's  Needs assistance/impaired  Eating/Feeding Set up;Bed level  Grooming Set up;Bed level  Upper Body Bathing Minimal assistance;Sitting  Lower Body Bathing Total assistance;Sitting/lateral leans;Sit to/from stand  Upper Body Dressing  Moderate assistance;Sitting  Lower Body Dressing Total assistance;Sitting/lateral leans;Sit to/from stand  Lower Body Dressing Details (indicate cue type and reason) patient has assistance at baseline  Toilet Transfer Moderate assistance;Cueing for safety;Cueing for sequencing;BSC;RW;Ambulation  Toilet Transfer Details (indicate cue type and reason) initial mod A to power up to standing, min A with rolling walker and max cues to take steps towards chair and back up so LEs touching the chair. required hand over hand assist to reach back for chair arms  Toileting- Clothing Manipulation and Hygiene Total assistance  Functional mobility during ADLs Minimal assistance;Moderate assistance;Rolling walker;Cueing for safety;Cueing for sequencing (mod to stand, min to take few steps)  Bed Mobility  Overal bed mobility Needs Assistance  Bed Mobility Rolling;Sidelying to Sit  Rolling Max assist  Sidelying to sit Max assist;HOB elevated  General bed mobility comments max cues to sequence log roll technique with patient having difficulty bending L knee would extend despite visual and verbal cues. max A for LE and trunk management to sit up at EOB  Transfers  Overall transfer level Needs assistance  Equipment used Rolling walker (2 wheeled)  Transfers Sit to/from Stand  Sit to Stand Mod assist  General transfer comment please see toilet transfer in ADL section; mod A to power up to stand min A to complete transfer to recliner  Balance  Overall balance assessment Needs assistance;History of Falls  Sitting-balance support Feet supported;Bilateral upper extremity supported  Sitting balance-[name] Poor  Standing balance support Bilateral upper extremity supported;During functional activity  Standing balance-[name] Poor  Standing balance comment reliant on B UE support and min A from OT  OT - End of Session  Equipment Utilized During Treatment Gait belt;Rolling walker  Activity Tolerance Patient tolerated treatment well  Patient left in chair;with call bell/phone within reach;with chair alarm set  Nurse Communication Mobility status  OT Assessment  OT Recommendation/Assessment Patient needs continued OT Services  OT Visit Diagnosis Unsteadiness on feet (R26.81);Other abnormalities of gait and mobility (R26.89);Pain  Pain - part of body  (back)  OT Problem List Decreased activity tolerance;Pain  OT Plan  OT Frequency (ACUTE ONLY) Min 2X/week  OT Treatment/Interventions (ACUTE ONLY) Patient/family education;Therapeutic activities;Self-care/ADL training;Therapeutic exercise;Balance training  AM-PAC OT "6 Clicks" Daily Activity Outcome Measure (Version 2)  Help from another person eating meals? 3  Help from another person taking care of personal grooming? 3  Help from another person toileting, which includes using toliet, bedpan, or urinal? 2  Help from another person bathing (including washing, rinsing, drying)? 2  Help from another person to put on and taking off regular upper body clothing? 2  Help from another person to put on and taking off regular lower body clothing? 1  6 Click Score 13  OT Recommendation  Follow Up Recommendations SNF  OT Equipment None recommended by OT  Individuals Consulted  Consulted and Agree with Results and Recommendations Family  member/caregiver  Family Member Consulted spouse  Acute Rehab OT Goals  Patient Stated Goal get stronger  OT Goal Formulation With patient/family  Time For Goal Achievement 08/06/2020  Potential to Achieve Goals Good  OT Time Calculation  OT Start Time (ACUTE ONLY) 1417  OT Stop Time (ACUTE ONLY) 1436  OT Time Calculation (min) 19 min  OT General Charges  $OT Visit 1 Visit  OT Evaluation  $OT Eval Low Complexity 1 Low  Written Expression  Dominant Hand Right   Delbert Phenix OT OT pager: [phone]

## 2020-07-26 NOTE — Progress Notes (Signed)
Initial Nutrition Assessment  INTERVENTION:   -Ensure Enlive po TID, each supplement provides 350 kcal and 20 grams of protein -Magic cup BID with meals, each supplement provides 290 kcal and 9 grams of protein  NUTRITION DIAGNOSIS:   Inadequate oral intake related to poor appetite as evidenced by per patient/family report.  GOAL:   Patient will meet greater than or equal to 90% of their needs  MONITOR:   PO intake,Supplement acceptance,Labs,Weight trends,I & O's  REASON FOR ASSESSMENT:   Malnutrition Screening Tool    ASSESSMENT:   86 y.o. 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 Admitted for generalized weakness.  Patient not available at time of visit, working with therapies.  Per chart review, pt is alert/oriented x 1. Pt has dementia, from memory care unit. No PO documentation available. Ensure supplements have been ordered. Will also order Magic cups with meals.  Per weight records, pt's weight has increased since 10/8. Pt with history of severe malnutrition, unable to diagnose at this time.  Labs reviewed. Medications: Folic acid, liquid MVI  NUTRITION - FOCUSED PHYSICAL EXAM:  Unable to complete a this time- will attempt at follow-up  Diet Order:   Diet Order            Diet regular Room service appropriate? Yes; Fluid consistency: Thin  Diet effective now                 EDUCATION NEEDS:   No education needs have been identified at this timeNone  Skin:  Skin Assessment: Reviewed RN Assessment  Last BM:  PTA  Height:   Ht Readings from Last 1 Encounters:  07/24/20 5\' 3"  (1.6 m)    Weight:   Wt Readings from Last 1 Encounters:  07/24/20 53.5 kg    BMI:  Body mass index is 20.9  kg/m.  Estimated Nutritional Needs:   Kcal:  1600-1800  Protein:  85-100g  Fluid:  1.8L/day  Clayton Bibles, MS, RD, LDN Inpatient Clinical Dietitian Contact information available via Amion

## 2020-07-26 NOTE — Progress Notes (Addendum)
PROGRESS NOTE    Eric Larsen  ZOX:[id number] DOB: [birth year] DOA: 07/24/2020 PCP: [name], MD   Brief Narrative: Eric Larsen is a 86 y.o. male with a history of HSV encephalitis, seizures, dementia. Patient presented from facility secondary to weakness and difficulty walking. No with concern for possible infection.   Assessment & Plan:   Principal Problem:   Weakness generalized Active Problems:   Primary hypothyroidism   Hypothyroidism   Dementia associated with other underlying disease without behavioral disturbance (HCC)   Subdural hematoma (HCC)   Weakness   Generalized weakness Possibly from dehydration. Patient started on IV fluids -Continue IV fluids -PT/OT eval  Leukocytosis Significant increased from admission. Up to 32.3k today. No true fever but has high grade temperature with tmax of 100 F. Blood cultures obtained on admission and are no growth to date. Urine culture with insignificant growth -Empirically start Vancomycin and Cefepime for unknown source of possible infection -Procalcitonin -Daily CBC -Chest x-ray  Possible aspiration -SLP consult -X-ray as mentioned above  Subdural hematoma Evaluated by neurosurgery with recommendations for no follow-up  Back pain MRI thoracic spine significant for acute thoracic compression fracture. T10 level. Discussed with neurosurgery, Dr. Ronnald Ramp, who recommended no TLSO brace secondary to patient's baseline function in addition to significant kyphosis and mild degree of fracture present.  Hypothyroidism -Continue Synthroid  Seizure disorder -Continue Keppra and Depakote   DVT prophylaxis: SCDs Code Status:   Code Status: DNR Family Communication: Wife on telephone but no answer. Wife at bedside Disposition Plan: Discharge home vs SNF pending PT recommendations in addition to infection workup   Consultants:   Neurosurgery  Procedures:    None  Antimicrobials:  Vancomycin IV  Cefepime IV    Subjective: No pain.  Objective: Vitals:   07/25/20 1630 07/25/20 1827 07/25/20 2052 07/26/20 0241  BP: (!) 126/93 132/90 135/77 107/63  Pulse: 85 (!) 109 (!) 103 99  Resp: (!) 25 16 20 18   Temp:  98 F (36.7 C) 100 F (37.8 C) 98.4 F (36.9 C)  TempSrc:  Oral    SpO2: 93% 94% 100% (!) 89%  Weight:      Height:        Intake/Output Summary (Last 24 hours) at 07/26/2020 1152 Last data filed at 07/26/2020 0544 Gross per 24 hour  Intake 570 ml  Output 525 ml  Net 45 ml   Filed Weights   07/24/20 2155  Weight: 53.5 kg    Examination:  General exam: Appears calm and comfortable Respiratory system: Rhonchi with oral intake. Cardiovascular system: S1 & S2 heard, RRR. No murmurs, rubs, gallops or clicks. Gastrointestinal system: Abdomen is nondistended, soft and nontender. No organomegaly or masses felt. Normal bowel sounds heard. Central nervous system: Alert. Musculoskeletal: No edema. No calf tenderness Skin: No cyanosis. No rashes Psychiatry: Judgement and insight appear impaired.    Data Reviewed: I have personally reviewed following labs and imaging studies  CBC Lab Results  Component Value Date   WBC 32.3 (H) 07/26/2020   RBC 3.70 (L) 07/26/2020   HGB 12.4 (L) 07/26/2020   HCT 37.2 (L) 07/26/2020   MCV 100.5 (H) 07/26/2020   MCH 33.5 07/26/2020   PLT 187 07/26/2020   MCHC 33.3 07/26/2020   RDW 21.0 (H) 07/26/2020   LYMPHSABS 2.0 07/26/2020   MONOABS 2.4 (H) 07/26/2020   EOSABS 0.0 07/26/2020   BASOSABS 0.1 81/19/1478     Last metabolic panel Lab Results  Component Value Date   NA  142 07/26/2020   K 3.9 07/26/2020   CL 108 07/26/2020   CO2 25 07/26/2020   BUN 24 (H) 07/26/2020   CREATININE 0.83 07/26/2020   GLUCOSE 149 (H) 07/26/2020   GFRNONAA >60 07/26/2020   GFRAA >60 11/13/2019   CALCIUM 8.9 07/26/2020   PROT 6.2 (L) 07/26/2020   ALBUMIN 3.2 (L) 07/26/2020   BILITOT 2.8  (H) 07/26/2020   ALKPHOS 53 07/26/2020   AST 23 07/26/2020   ALT 16 07/26/2020   ANIONGAP 9 07/26/2020    CBG (last 3)  Recent Labs    07/24/20 2220  GLUCAP 133*     GFR: Estimated Creatinine Clearance: 47.4 mL/min (by C-G formula based on SCr of 0.83 mg/dL).  Coagulation Profile: No results for input(s): INR, PROTIME in the last 168 hours.  Recent Results (from the past 240 hour(s))  Urine culture     Status: Abnormal   Collection Time: 07/24/20 10:13 PM   Specimen: Urine, Clean Catch  Result Value Ref Range Status   Specimen Description   Final    URINE, CLEAN CATCH Performed at [facility], [place] [address], [place], [place] [address]    Special Requests   Final    NONE Performed at [facility], [place] [address], [place], [place] [address]    Culture (A)  Final    <10,000 COLONIES/mL INSIGNIFICANT GROWTH Performed at [place] [address], [place], [place] [address]    Report Status 07/26/2020 FINAL  Final  Resp Panel by RT-PCR (Flu A&B, Covid) Nasopharyngeal Swab     Status: None   Collection Time: 07/24/20 10:13 PM   Specimen: Nasopharyngeal Swab; Nasopharyngeal(NP) swabs in vial transport medium  Result Value Ref Range Status   SARS Coronavirus 2 by RT PCR NEGATIVE NEGATIVE Final    Comment: (NOTE) SARS-CoV-2 target nucleic acids are NOT DETECTED.  The SARS-CoV-2 RNA is generally detectable in upper respiratory specimens during the acute phase of infection. The lowest concentration of SARS-CoV-2 viral copies this assay can detect is 138 copies/mL. A negative result does not preclude SARS-Cov-2 infection and should not be used as the sole basis for treatment or other patient management decisions. A negative result may occur with  improper specimen collection/handling, submission of specimen other than nasopharyngeal swab, presence of viral mutation(s) within the areas targeted by this assay, and inadequate  number of viral copies(<138 copies/mL). A negative result must be combined with clinical observations, patient history, and epidemiological information. The expected result is Negative.  Fact Sheet for Patients:  EntrepreneurPulse.com.au  Fact Sheet for Healthcare Providers:  IncredibleEmployment.be  This test is no t yet approved or cleared by the [place] FDA and  has been authorized for detection and/or diagnosis of SARS-CoV-2 by FDA under an Emergency Use Authorization (EUA). This EUA will remain  in effect (meaning this test can be used) for the duration of the COVID-19 declaration under Section 564(b)(1) of the Act, 21 U.S.C.section 360bbb-3(b)(1), unless the authorization is terminated  or revoked sooner.       Influenza A by PCR NEGATIVE NEGATIVE Final   Influenza B by PCR NEGATIVE NEGATIVE Final    Comment: (NOTE) The Xpert Xpress SARS-CoV-2/FLU/RSV plus assay is intended as an aid in the diagnosis of influenza from Nasopharyngeal swab specimens and should not be used as a sole basis for treatment. Nasal washings and aspirates are unacceptable for Xpert Xpress SARS-CoV-2/FLU/RSV testing.  Fact Sheet for Patients: EntrepreneurPulse.com.au  Fact Sheet for  Healthcare Providers: IncredibleEmployment.be  This test is not yet approved or cleared by the [place] and has been authorized for detection and/or diagnosis of SARS-CoV-2 by FDA under an Emergency Use Authorization (EUA). This EUA will remain in effect (meaning this test can be used) for the duration of the COVID-19 declaration under Section 564(b)(1) of the Act, 21 U.S.C. section 360bbb-3(b)(1), unless the authorization is terminated or revoked.  Performed at [facility], [place] [address], [place], [place] [address]   Culture, blood (routine x 2)     Status: None (Preliminary result)   Collection Time:  07/25/20  7:40 AM   Specimen: BLOOD  Result Value Ref Range Status   Specimen Description   Final    BLOOD LEFT ARM Performed at [place] [address], [place], [place] [address]    Special Requests   Final    BOTTLES DRAWN AEROBIC AND ANAEROBIC Blood Culture adequate volume Performed at [place] [address], [place], [place] [address]    Culture   Final    NO GROWTH < 24 HOURS Performed at [place] [address], [place], [place] [address]    Report Status PENDING  Incomplete  Culture, blood (routine x 2)     Status: None (Preliminary result)   Collection Time: 07/25/20  7:47 AM   Specimen: BLOOD  Result Value Ref Range Status   Specimen Description   Final    BLOOD RIGHT ARM Performed at [place] [address] [place], [place] [address]    Special Requests   Final    BOTTLES DRAWN AEROBIC AND ANAEROBIC Blood Culture results may not be optimal due to an inadequate volume of blood received in culture bottles Performed at [place] [address], [place], [place] [address]    Culture   Final    NO GROWTH < 24 HOURS Performed at [place] [address] [place], [place] [address]    Report Status PENDING  Incomplete        Radiology Studies: CT HEAD WO CONTRAST  Result Date: 07/25/2020 CLINICAL DATA:  Fall 07/22/2020 EXAM: CT HEAD WITHOUT CONTRAST TECHNIQUE: Contiguous axial images were obtained from the base of the skull through the vertex without intravenous contrast. COMPARISON:  03/30/2020 FINDINGS: Brain: Significant brain atrophy, especially at the temporal lobes where there is asymmetric gliosis on the left. The gliosis is medial and anterior and could be posttraumatic or post ischemic. Trace (3 mm in thickness) right parafalcine thickening of the posterior more than anterior falx, only convincing for hematoma given the comparison. No infarct,  obstructive hydrocephalus, or masslike finding. Vascular: Atheromatous calcification Skull: No acute or aggressive finding. Trabecular coarsening over the greater wing sphenoid and squamosal left temporal bone which could be from fibrous dysplasia or Paget's disease. Sinuses/Orbits: Bilateral cataract resection Critical Value/emergent results were called by telephone at the time of interpretation on 07/25/2020 at 5:39 am to provider [facility] , who verbally acknowledged these results. IMPRESSION: 1. Trace falcine subdural hematoma, only convincing given a recent comparison. 2. Brain atrophy especially prominent the temporal lobes. 3. Remote infarct or contusion at the left temporal lobe. Electronically Signed   By: Monte Fantasia M.D.   On: 07/25/2020 06:02   MR THORACIC SPINE WO CONTRAST  Result Date: 07/25/2020 CLINICAL DATA:  Severe mid back pain. EXAM: MRI THORACIC SPINE WITHOUT CONTRAST TECHNIQUE: Multiplanar, multisequence MR imaging of the  thoracic spine was performed. No intravenous contrast was administered. COMPARISON:  CT a chest from same day. FINDINGS: Alignment:  Exaggerated thoracic kyphosis.  No listhesis. Vertebrae: Acute T10 anterior inferior endplate compression fracture with approximately 20% height loss. No retropulsion. Chronic T1, T2, T3, T7, T8, T9 and, and T11 compression fractures. No evidence of discitis or suspicious bone lesion. Cord:  Normal signal and morphology. Paraspinal and other soft tissues: Cardiomegaly. Aortic atherosclerosis. Trace bilateral pleural effusions. Disc levels: Scattered small central and paracentral disc protrusions. No spinal canal or neuroforaminal stenosis. IMPRESSION: 1. Acute mild T10 anterior inferior endplate compression fracture. No retropulsion. 2. Multiple additional chronic thoracic compression fractures. Electronically Signed   By: Titus Dubin M.D.   On: 07/25/2020 08:59   DG Chest Portable 1 View  Result Date: 07/24/2020 CLINICAL  DATA:  86 year old male with cough and confusion. Weakness, fever starting today. EXAM: PORTABLE CHEST 1 VIEW COMPARISON:  Chest radiographs 05/24/2020 and earlier. FINDINGS: Portable AP semi upright view at 2334 hours. Stable lung volumes and mediastinal contours. Allowing for portable technique the lungs are clear. No pneumothorax. Visible bowel gas pattern is within normal limits. Stable visualized osseous structures, including previous left lateral rib fractures, left humerus ORIF. Stable cholecystectomy clips. IMPRESSION: No acute cardiopulmonary abnormality. Electronically Signed   By: Genevie Ann M.D.   On: 07/24/2020 23:51   CT Angio Chest/Abd/Pel for Dissection W and/or Wo Contrast  Result Date: 07/25/2020 CLINICAL DATA:  Abdominal pain. Concern for aortic dissection. Weakness for 2 days. EXAM: CT ANGIOGRAPHY CHEST, ABDOMEN AND PELVIS TECHNIQUE: Non-contrast CT of the chest was initially obtained. Multidetector CT imaging through the chest, abdomen and pelvis was performed using the standard protocol during bolus administration of intravenous contrast. Multiplanar reconstructed images and MIPs were obtained and reviewed to evaluate the vascular anatomy. CONTRAST:  64mL OMNIPAQUE IOHEXOL 350 MG/ML SOLN COMPARISON:  CT chest dated 08/16/2017. FINDINGS: CTA CHEST FINDINGS Cardiovascular: The heart size is mildly enlarged. There are coronary artery calcifications. Moderate atherosclerotic changes are noted of the thoracic aorta without evidence for dissection or aneurysm. The arch vessels are grossly patent where visualized. There is no large centrally located pulmonary embolism. Mediastinum/Nodes: -- No mediastinal lymphadenopathy. -- No hilar lymphadenopathy. -- No axillary lymphadenopathy. -- No supraclavicular lymphadenopathy. -- Normal thyroid gland where visualized. -  Unremarkable esophagus. Lungs/Pleura: Mild emphysematous changes are noted bilaterally. There is atelectasis at the lung bases, right  greater than left. There is no pneumothorax or large pleural effusion. The trachea is unremarkable. Musculoskeletal: There is age-indeterminate height loss of the T1 and T2 vertebral bodies, new since prior study in 2018. Additional chronic appearing height loss is noted of multiple vertebral bodies throughout the thoracic spine. There is an old healed fracture of the sternum. Review of the MIP images confirms the above findings. CTA ABDOMEN AND PELVIS FINDINGS VASCULAR Aorta: There are atherosclerotic changes throughout the abdominal aorta without evidence for an aneurysm or dissection. 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 SMA: There is moderate narrowing of the proximal SMA. Renals: There is moderate narrowing of both renal arteries. IMA: Patent without evidence of aneurysm, dissection, vasculitis or significant stenosis. Inflow: Patent without evidence of aneurysm, dissection, vasculitis or significant stenosis. Veins: No obvious venous abnormality within the limitations of this arterial phase study. Review of the MIP images confirms the above findings. NON-VASCULAR Hepatobiliary: The liver is normal. Status post cholecystectomy.There is mild extrahepatic and intrahepatic biliary ductal dilatation. Pancreas: Normal  contours without ductal dilatation. No peripancreatic fluid collection. Spleen: Spleen is borderline enlarged measuring 12 cm craniocaudad. Adrenals/Urinary Tract: --Adrenal glands: Unremarkable. --Right kidney/ureter: No hydronephrosis or radiopaque kidney stones. --Left kidney/ureter: There is an indeterminate exophytic lesion involving the posterior interpolar region of the left kidney measuring approximately 1.8 cm and 42 Hounsfield units. --Urinary bladder: The urinary bladder cannot be well evaluated secondary to extensive streak artifact. Stomach/Bowel: --Stomach/Duodenum: No hiatal hernia or other gastric abnormality.  Normal duodenal course and caliber. --Small bowel: Unremarkable. --Colon: There is a large amount of stool throughout the colon, especially in the rectum and cecum. --Appendix: Not visualized. No right lower quadrant inflammation or free fluid. Lymphatic: Normal course and caliber of the major abdominal vessels. --No retroperitoneal lymphadenopathy. --No mesenteric lymphadenopathy. --No pelvic or inguinal lymphadenopathy. Reproductive: The prostate gland is enlarged. Other: No ascites or free air. The abdominal wall is normal. Musculoskeletal. There is chronic appearing height loss of multiple lumbar vertebral bodies including the L2, L3, and L4 vertebral bodies. There is no definite acute compression fracture identified on today's study. The patient is status post prior total hip arthroplasty on the left and intramedullary nailing of the right femur. There is a sclerotic lesion in the right iliac bone. Review of the MIP images confirms the above findings. IMPRESSION: 1. No evidence for aortic dissection. No evidence for large centrally located pulmonary embolism. 2. Age-indeterminate height loss of the T1 and T2 vertebral bodies, new since prior study in 2018. Additional chronic appearing height loss is noted of multiple vertebral bodies throughout the thoracic and lumbar spine. Diffuse osteopenia is noted. 3. There is a large amount of stool throughout the colon, especially in the rectum and cecum. Correlate for constipation. 4. Indeterminate exophytic lesion involving the posterior interpolar region of the left kidney measuring approximately 1.8 cm and 42 Hounsfield units. While this may represent a hemorrhagic or proteinaceous cyst, a solid mass is not excluded. Follow-up with a nonemergent outpatient renal ultrasound is recommended for further evaluation. 5. Cardiomegaly and coronary artery calcifications. 6. Mild intrahepatic and extrahepatic biliary ductal dilatation. Correlation with laboratory studies is  recommended. Aortic Atherosclerosis (ICD10-I70.0) and Emphysema (ICD10-J43.9). Electronically Signed   By: Constance Holster M.D.   On: 07/25/2020 03:01        Scheduled Meds: . divalproex  250 mg Oral Q12H   And  . divalproex  125 mg Oral Q1400  . feeding supplement  237 mL Oral TID BM  . folic acid  1 mg Oral Daily  . levETIRAcetam  500 mg Oral BID  . levothyroxine  125 mcg Oral Q0600  . melatonin  3 mg Oral QHS  . multivitamin  15 mL Oral Q1400   Continuous Infusions: . ceFEPime (MAXIPIME) IV    . vancomycin       LOS: 1 day     Cordelia Poche, MD [facility] 07/26/2020, 11:52 AM  If 7PM-7AM, please contact night-coverage [email or web address]

## 2020-07-26 NOTE — Evaluation (Signed)
Physical Therapy Evaluation Patient Details Name: Eric Larsen MRN: [id number] DOB: [birth year] Today's Date: 07/26/2020   History of Present Illness  86 y.o. 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 CT chest abdomen pelvis was done which shows T1 and and T2 age-indeterminate compression fractures.  CT head was done which showed a small subdural hematoma.  Clinical Impression  Pt admitted with above diagnosis.  Recommend SNF post acute. See below for details. Pt is  Pleasant and cooperative but requiring overall max assist for basic mobility   Pt currently with functional limitations due to the deficits listed below (see PT Problem List). Pt will benefit from skilled PT to increase their independence and safety with mobility to allow discharge to the venue listed below.     Follow Up Recommendations SNF    Equipment Recommendations  None recommended by PT    Recommendations for Other Services       Precautions / Restrictions Precautions Precautions: Fall Restrictions Weight Bearing Restrictions: No      Mobility  Bed Mobility Overal bed mobility: Needs Assistance Bed Mobility: Rolling;Sidelying to Sit;Sit to Supine Rolling: Mod assist Sidelying to sit: Max assist   Sit to supine: Max assist   General bed mobility comments: assist to roll, reach for bed rail. assist to bring LEs off and onto bed as well as guide trunk in to sitting and control descent to supine. requires incr time. bed pad used to position once in sitting    Transfers Overall transfer level: Needs assistance Equipment used: Rolling walker (2 wheeled) Transfers: Sit to/from Stand Sit to Stand: Max assist         General transfer comment: assist for superior wt shift, cues to incr knee flexion and use LEs to power up. cues for safety and sequencing  Ambulation/Gait             General  Gait Details: lateral steps along EOB with mod assist  Stairs            Wheelchair Mobility    Modified Rankin (Stroke Patients Only)       Balance Overall balance assessment: Needs assistance;History of Falls Sitting-balance support: Feet supported;Bilateral upper extremity supported Sitting balance-[name]: Poor   Postural control: Posterior lean   Standing balance-[name]: Poor Standing balance comment: hips and trunk flexed in standing. pt able to stabilize and maintain static stand with UE support and close supervision                             Pertinent Vitals/Pain Pain Assessment: Faces Faces Pain Scale: Hurts a little bit Pain Location: back with initial sitting Pain Descriptors / Indicators: Discomfort;Grimacing Pain Intervention(s): Limited activity within patient's tolerance;Monitored during session;Repositioned    Home Living Family/patient expects to be discharged to:: Skilled nursing facility                      Prior Function Level of Independence: Needs assistance   Gait / Transfers Assistance Needed: per wife pt amb with rollator at [place] / [employer] Assistance Needed: feeds self per wife but requires encouragement, assist with ADLs from wife or staff        Hand Dominance        Extremity/Trunk Assessment   Upper Extremity Assessment Upper Extremity Assessment: Defer to OT evaluation  Lower Extremity Assessment Lower Extremity Assessment: Generalized weakness    Cervical / Trunk Assessment Cervical / Trunk Assessment: Kyphotic;Other exceptions Cervical / Trunk Exceptions: excessive hip/trunk flexion in standing  Communication   Communication: No difficulties  Cognition Arousal/Alertness: Awake/alert Behavior During Therapy: WFL for tasks assessed/performed Overall Cognitive Status: History of cognitive impairments - at baseline                                 General  Comments: occasional word finding difficulty. oriented to self. follows one step commands with incr time. pleasant and agreeable      General Comments      Exercises     Assessment/Plan    PT Assessment Patient needs continued PT services  PT Problem List Decreased strength;Decreased mobility;Decreased range of motion;Decreased activity tolerance;Decreased balance;Decreased knowledge of use of DME       PT Treatment Interventions DME instruction;Therapeutic activities;Gait training;Functional mobility training;Stair training;Therapeutic exercise;Patient/family education;Balance training    PT Goals (Current goals can be found in the Care Plan section)  Acute Rehab PT Goals Patient Stated Goal: get stronger PT Goal Formulation: With patient Time For Goal Achievement: 08/03/2020 Potential to Achieve Goals: Good    Frequency Min 2X/week   Barriers to discharge        Co-evaluation               AM-PAC PT "6 Clicks" Mobility  Outcome Measure Help needed turning from your back to your side while in a flat bed without using bedrails?: A Lot Help needed moving from lying on your back to sitting on the side of a flat bed without using bedrails?: A Lot Help needed moving to and from a bed to a chair (including a wheelchair)?: A Lot Help needed standing up from a chair using your arms (e.g., wheelchair or bedside chair)?: A Lot Help needed to walk in hospital room?: Total Help needed climbing 3-5 steps with a railing? : Total 6 Click Score: 10    End of Session Equipment Utilized During Treatment: Gait belt Activity Tolerance: Patient tolerated treatment well Patient left: in bed;with call bell/phone within reach;with bed alarm set   PT Visit Diagnosis: Other abnormalities of gait and mobility (R26.89);Muscle weakness (generalized) (M62.81)    Time: 6811-5726 PT Time Calculation (min) (ACUTE ONLY): 25 min   Charges:   PT Evaluation $PT Eval Low Complexity: 1 Low PT  Treatments $Therapeutic Activity: 8-22 mins        [name], PT  Acute Rehab Dept ([place]) [phone] Pager [phone]  07/26/2020   [facility] 07/26/2020, 1:51 PM

## 2020-07-27 DIAGNOSIS — J690 Pneumonitis due to inhalation of food and vomit: Principal | ICD-10-CM

## 2020-07-27 DIAGNOSIS — F0280 Dementia in other diseases classified elsewhere without behavioral disturbance: Secondary | ICD-10-CM

## 2020-07-27 LAB — CBC
HCT: 32.7 % — ABNORMAL LOW (ref 39.0–52.0)
Hemoglobin: 10.2 g/dL — ABNORMAL LOW (ref 13.0–17.0)
MCH: 33.2 pg (ref 26.0–34.0)
MCHC: 31.2 g/dL (ref 30.0–36.0)
MCV: 106.5 fL — ABNORMAL HIGH (ref 80.0–100.0)
Platelets: 170 10*3/uL (ref 150–400)
RBC: 3.07 MIL/uL — ABNORMAL LOW (ref 4.22–5.81)
RDW: 20.3 % — ABNORMAL HIGH (ref 11.5–15.5)
WBC: 12.1 10*3/uL — ABNORMAL HIGH (ref 4.0–10.5)
nRBC: 0 % (ref 0.0–0.2)

## 2020-07-27 LAB — PROCALCITONIN: Procalcitonin: 0.94 ng/mL

## 2020-07-27 LAB — MRSA PCR SCREENING: MRSA by PCR: NEGATIVE

## 2020-07-27 MED ORDER — LACTATED RINGERS IV BOLUS
1000.0000 mL | Freq: Once | INTRAVENOUS | Status: AC
  Administered 2020-07-27: 1000 mL via INTRAVENOUS

## 2020-07-27 MED ORDER — POLYETHYLENE GLYCOL 3350 17 G PO PACK
17.0000 g | PACK | Freq: Every day | ORAL | Status: DC
  Administered 2020-07-27 – 2020-07-28 (×2): 17 g via ORAL
  Filled 2020-07-27 (×4): qty 1

## 2020-07-27 MED ORDER — HALOPERIDOL LACTATE 5 MG/ML IJ SOLN
2.0000 mg | Freq: Once | INTRAMUSCULAR | Status: AC
  Administered 2020-07-27: 2 mg via INTRAVENOUS
  Filled 2020-07-27: qty 1

## 2020-07-27 NOTE — Progress Notes (Signed)
PROGRESS NOTE    Eric Larsen  BTD:[id number] DOB: 01/08/1931 DOA: 07/24/2020 PCP: [name], MD   Brief Narrative: Eric Larsen is a 86 y.o. male with a history of HSV encephalitis, seizures, dementia. Patient presented from facility secondary to weakness and difficulty walking. No with concern for possible infection.   Assessment & Plan:   Principal Problem:   Weakness generalized Active Problems:   Primary hypothyroidism   Hypothyroidism   Dementia associated with other underlying disease without behavioral disturbance (HCC)   Subdural hematoma (HCC)   Weakness   Generalized weakness Possibly from dehydration. Patient started on IV fluids -Continue IV fluids -PT/OT eval  Bilateral community acquired pneumonia Aspiration pneumonia Significant increased from admission. Up to 32.3k today. No true fever but has high grade temperature with tmax of 100 F. Blood cultures obtained on admission and are no growth to date. Urine culture with insignificant growth. Chest x-ray with evidence of pneumonia. Procalcitonin trending down with antibiotics -Vancomycin and Cefepime -MRSA pcr; if negative, discontinue Vancomycin -Procalcitonin -Daily CBC  Subdural hematoma Evaluated by neurosurgery with recommendations for no follow-up  Back pain MRI thoracic spine significant for acute thoracic compression fracture. T10 level. Discussed with neurosurgery, Dr. Ronnald Ramp, who recommended no TLSO brace secondary to patient's baseline function in addition to significant kyphosis and mild degree of fracture present.  Hypothyroidism -Continue Synthroid  Seizure disorder -Continue Keppra and Depakote   DVT prophylaxis: SCDs Code Status:   Code Status: DNR Family Communication: Wife on telephone Disposition Plan: Discharge SNF pending transition to oral antibiotics and speech therapy recommendations   Consultants:   Neurosurgery  Procedures:    None  Antimicrobials:  Vancomycin IV  Cefepime IV    Subjective: No concerns today.  Objective: Vitals:   07/26/20 0241 07/26/20 1402 07/26/20 2037 07/27/20 0501  BP: 107/63 108/64 119/78 110/76  Pulse: 99 93 91 76  Resp: 18 18 18 18   Temp: 98.4 F (36.9 C) 98.3 F (36.8 C) 98.1 F (36.7 C) 98.4 F (36.9 C)  TempSrc:  Oral    SpO2: (!) 89% 93% 93%   Weight:      Height:        Intake/Output Summary (Last 24 hours) at 07/27/2020 1110 Last data filed at 07/27/2020 0915 Gross per 24 hour  Intake 100 ml  Output 750 ml  Net -650 ml   Filed Weights   07/24/20 2155  Weight: 53.5 kg    Examination:  General exam: Appears calm and comfortable Respiratory system: Diminished. Respiratory effort normal. Cardiovascular system: S1 & S2 heard, RRR. No murmurs, rubs, gallops or clicks. Gastrointestinal system: Abdomen is mildly distended, soft and nontender. No organomegaly or masses felt. Normal bowel sounds heard. Central nervous system: Alert. Musculoskeletal: No edema. No calf tenderness Skin: No cyanosis. No rashes Psychiatry: Judgement and insight appear normal. Mood & affect appropriate.     Data Reviewed: I have personally reviewed following labs and imaging studies  CBC Lab Results  Component Value Date   WBC 12.1 (H) 07/27/2020   RBC 3.07 (L) 07/27/2020   HGB 10.2 (L) 07/27/2020   HCT 32.7 (L) 07/27/2020   MCV 106.5 (H) 07/27/2020   MCH 33.2 07/27/2020   PLT 170 07/27/2020   MCHC 31.2 07/27/2020   RDW 20.3 (H) 07/27/2020   LYMPHSABS 2.0 07/26/2020   MONOABS 2.4 (H) 07/26/2020   EOSABS 0.0 07/26/2020   BASOSABS 0.1 10/62/6948     Last metabolic panel Lab Results  Component Value Date  NA 142 07/26/2020   K 3.9 07/26/2020   CL 108 07/26/2020   CO2 25 07/26/2020   BUN 24 (H) 07/26/2020   CREATININE 0.83 07/26/2020   GLUCOSE 149 (H) 07/26/2020   GFRNONAA >60 07/26/2020   GFRAA >60 11/13/2019   CALCIUM 8.9 07/26/2020   PROT 6.2 (L)  07/26/2020   ALBUMIN 3.2 (L) 07/26/2020   BILITOT 2.8 (H) 07/26/2020   ALKPHOS 53 07/26/2020   AST 23 07/26/2020   ALT 16 07/26/2020   ANIONGAP 9 07/26/2020    CBG (last 3)  Recent Labs    07/24/20 2220  GLUCAP 133*     GFR: Estimated Creatinine Clearance: 47.4 mL/min (by C-G formula based on SCr of 0.83 mg/dL).  Coagulation Profile: No results for input(s): INR, PROTIME in the last 168 hours.  Recent Results (from the past 240 hour(s))  Urine culture     Status: Abnormal   Collection Time: 07/24/20 10:13 PM   Specimen: Urine, Clean Catch  Result Value Ref Range Status   Specimen Description   Final    URINE, CLEAN CATCH Performed at [facility], [place] [address] [place], [place] [address]    Special Requests   Final    NONE Performed at [facility], [place] [address] [place], [place] [address]    Culture (A)  Final    <10,000 COLONIES/mL INSIGNIFICANT GROWTH Performed at [facility] [address], [place], [place] [address]    Report Status 07/26/2020 FINAL  Final  Resp Panel by RT-PCR (Flu A&B, Covid) Nasopharyngeal Swab     Status: None   Collection Time: 07/24/20 10:13 PM   Specimen: Nasopharyngeal Swab; Nasopharyngeal(NP) swabs in vial transport medium  Result Value Ref Range Status   SARS Coronavirus 2 by RT PCR NEGATIVE NEGATIVE Final    Comment: (NOTE) SARS-CoV-2 target nucleic acids are NOT DETECTED.  The SARS-CoV-2 RNA is generally detectable in upper respiratory specimens during the acute phase of infection. The lowest concentration of SARS-CoV-2 viral copies this assay can detect is 138 copies/mL. A negative result does not preclude SARS-Cov-2 infection and should not be used as the sole basis for treatment or other patient management decisions. A negative result may occur with  improper specimen collection/handling, submission of specimen other than nasopharyngeal swab, presence of viral mutation(s)  within the areas targeted by this assay, and inadequate number of viral copies(<138 copies/mL). A negative result must be combined with clinical observations, patient history, and epidemiological information. The expected result is Negative.  Fact Sheet for Patients:  EntrepreneurPulse.com.au  Fact Sheet for Healthcare Providers:  IncredibleEmployment.be  This test is no t yet approved or cleared by the [place] FDA and  has been authorized for detection and/or diagnosis of SARS-CoV-2 by FDA under an Emergency Use Authorization (EUA). This EUA will remain  in effect (meaning this test can be used) for the duration of the COVID-19 declaration under Section 564(b)(1) of the Act, 21 U.S.C.section 360bbb-3(b)(1), unless the authorization is terminated  or revoked sooner.       Influenza A by PCR NEGATIVE NEGATIVE Final   Influenza B by PCR NEGATIVE NEGATIVE Final    Comment: (NOTE) The Xpert Xpress SARS-CoV-2/FLU/RSV plus assay is intended as an aid in the diagnosis of influenza from Nasopharyngeal swab specimens and should not be used as a sole basis for treatment. Nasal washings and aspirates are unacceptable for Xpert Xpress SARS-CoV-2/FLU/RSV testing.  Fact Sheet for Patients: EntrepreneurPulse.com.au  Fact Sheet  for Healthcare Providers: IncredibleEmployment.be  This test is not yet approved or cleared by the [place] and has been authorized for detection and/or diagnosis of SARS-CoV-2 by FDA under an Emergency Use Authorization (EUA). This EUA will remain in effect (meaning this test can be used) for the duration of the COVID-19 declaration under Section 564(b)(1) of the Act, 21 U.S.C. section 360bbb-3(b)(1), unless the authorization is terminated or revoked.  Performed at [facility], [place] [address] [place], [place] [address]   Culture, blood (routine x 2)      Status: None (Preliminary result)   Collection Time: 07/25/20  7:40 AM   Specimen: BLOOD  Result Value Ref Range Status   Specimen Description   Final    BLOOD LEFT ARM Performed at [facility] [address], [place], [place] [address]    Special Requests   Final    BOTTLES DRAWN AEROBIC AND ANAEROBIC Blood Culture adequate volume Performed at [facility] [address] [place], [place] [address]    Culture   Final    NO GROWTH 2 DAYS Performed at [facility] [address], [place], [place] [address]    Report Status PENDING  Incomplete  Culture, blood (routine x 2)     Status: None (Preliminary result)   Collection Time: 07/25/20  7:47 AM   Specimen: BLOOD  Result Value Ref Range Status   Specimen Description   Final    BLOOD RIGHT ARM Performed at [facility] [address] [place], [place] [address]    Special Requests   Final    BOTTLES DRAWN AEROBIC AND ANAEROBIC Blood Culture results may not be optimal due to an inadequate volume of blood received in culture bottles Performed at [facility] [address] [place], [place] [address]    Culture   Final    NO GROWTH 2 DAYS Performed at [facility] [address] [place],  [address]    Report Status PENDING  Incomplete        Radiology Studies: DG CHEST PORT 1 VIEW  Result Date: 07/26/2020 CLINICAL DATA:  Cough.  Leukocytosis. EXAM: PORTABLE CHEST 1 VIEW COMPARISON:  CT 07/25/2020.  Chest x-ray 07/24/2020. FINDINGS: Mediastinum hilar structures normal. Low lung volumes with bibasilar infiltrates suggesting bibasilar pneumonia. No pleural effusion or pneumothorax. Degenerative changes scoliosis thoracic spine. Surgical clips right upper quadrant. IMPRESSION: Low lung volumes with bibasilar infiltrates suggesting bibasilar pneumonia. Electronically Signed   By: Marcello Moores  Register   On: 07/26/2020 16:17         Scheduled Meds:  divalproex  250 mg Oral Q12H   And   divalproex  125 mg Oral Q1400   feeding supplement  237 mL Oral TID BM   folic acid  1 mg Oral Daily   levETIRAcetam  500 mg Oral BID   levothyroxine  125 mcg Oral Q0600   melatonin  3 mg Oral QHS   multivitamin  15 mL Oral Q1400   Continuous Infusions:  ceFEPime (MAXIPIME) IV 200 mL/hr at 07/27/20 0018   vancomycin 750 mg (07/26/20 2200)     LOS: 2 days     Cordelia Poche, MD [facility] 07/27/2020, 11:10 AM  If 7PM-7AM, please contact night-coverage [email or web address]

## 2020-07-27 NOTE — Plan of Care (Signed)
  Problem: Clinical Measurements: Goal: Will remain free from infection Outcome: Progressing Goal: Diagnostic test results will improve Outcome: Progressing   

## 2020-07-27 NOTE — Evaluation (Signed)
Clinical/Bedside Swallow Evaluation Patient Details  Name: Eric Larsen MRN: [id number] Date of Birth: 01-Jun-1931  Today's Date: 07/27/2020 Time: SLP Start Time (ACUTE ONLY): 1400 SLP Stop Time (ACUTE ONLY): 1422 SLP Time Calculation (min) (ACUTE ONLY): 22 min  Past Medical History:  Past Medical History:  Diagnosis Date  . Cognitive decline   . Hypothyroidism   . Osteoporosis   . Thyroid disease    Past Surgical History:  Past Surgical History:  Procedure Laterality Date  . CHOLECYSTECTOMY    . HIP FRACTURE SURGERY    . INTRAMEDULLARY (IM) NAIL INTERTROCHANTERIC Right 08/13/2017   Procedure: INTRAMEDULLARY (IM) NAIL INTERTROCHANTRIC;  Surgeon: Earnestine Leys, MD;  Location: [facility] ORS;  Service: Orthopedics;  Laterality: Right;  . SHOULDER SURGERY     HPI:  86 y.o. 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 Patient fell while going to get COVID booster on 12/6 and since has had increased weakness. Patient also with ~6 week hx of increased back pain with MR of spine showing mild T10 anterior inferior endplate compression fracture. CXR shows PNA. Patient has an MBS in 11/2019 during CIR admission which recommended dysphagia 2 with nectar thick liquids, possible water protocol. Per wife, pateint advanced by SLP sometime in mid april after d/c.   Assessment / Plan / Recommendation Clinical Impression  Patient presents with a functional oropharyngeal swallow with appropriate mastication of bolus, oral containment, and consistent swallow initiation. Only signs of decreased airway protection are wet vocal quality which occur intermittently across consistencies. Given h/o dysphagia with decreased airway protection, acute PNA, and wife concerns over some swallowing difficulty seen at SNF prior to admission, recommend instrumental testing to evaluate swallowing physiology and assist in  determining most appropriate po diet for patient. SLP Visit Diagnosis: Dysphagia, unspecified (R13.10)       Diet Recommendation Regular;Thin liquid   Liquid Administration via: Cup;Straw Medication Administration: Crushed with puree Supervision: Patient able to self feed;Intermittent supervision to cue for compensatory strategies Compensations: Slow rate;Small sips/bites Postural Changes: Seated upright at 90 degrees    Other  Recommendations Oral Care Recommendations: Oral care BID   Follow up Recommendations  (TBD)        Swallow Study   General HPI: 86 y.o. 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 Patient fell while going to get COVID booster on 12/6 and since has had increased weakness. Patient also with ~6 week hx of increased back pain with MR of spine showing mild T10 anterior inferior endplate compression fracture. CXR shows PNA. Patient has an MBS in 11/2019 during CIR admission which recommended dysphagia 2 with nectar thick liquids, possible water protocol. Per wife, pateint advanced by SLP sometime in mid april after d/c. Type of Study: Bedside Swallow Evaluation Previous Swallow Assessment: see HPI Diet Prior to this Study: Regular;Thin liquids Temperature Spikes Noted: No Respiratory Status: Room air History of Recent Intubation: No Behavior/Cognition: Alert;Cooperative;Pleasant mood;Confused Oral Cavity Assessment: Dry Oral Care Completed by SLP: Recent completion by staff Vision: Functional for self-feeding Self-Feeding Abilities: Able to feed self Patient Positioning: Upright in bed Baseline Vocal Quality: Normal Volitional Cough: Cognitively unable to elicit Volitional Swallow: Unable to elicit    Oral/Motor/Sensory Function Overall Oral Motor/Sensory Function: Within functional limits   Ice Chips Ice chips: Within functional limits Presentation: Spoon    Thin Liquid Thin Liquid: Impaired Presentation:  Cup;Straw Pharyngeal  Phase Impairments: Wet Vocal Quality    Nectar Thick Nectar Thick Liquid: Impaired Presentation: Cup Pharyngeal Phase Impairments: Wet Vocal Quality   Honey Thick Honey Thick Liquid: Not tested   Puree Puree: Within functional limits Presentation: Spoon   Solid     Solid: Impaired Pharyngeal Phase Impairments: Wet Vocal Quality     Laurajean Hosek MA, CCC-SLP   Britani Beattie Meryl 07/27/2020,2:36 PM

## 2020-07-28 LAB — CBC
HCT: 35 % — ABNORMAL LOW (ref 39.0–52.0)
Hemoglobin: 11.6 g/dL — ABNORMAL LOW (ref 13.0–17.0)
MCH: 33.3 pg (ref 26.0–34.0)
MCHC: 33.1 g/dL (ref 30.0–36.0)
MCV: 100.6 fL — ABNORMAL HIGH (ref 80.0–100.0)
Platelets: 193 10*3/uL (ref 150–400)
RBC: 3.48 MIL/uL — ABNORMAL LOW (ref 4.22–5.81)
RDW: 20.1 % — ABNORMAL HIGH (ref 11.5–15.5)
WBC: 10 10*3/uL (ref 4.0–10.5)
nRBC: 0 % (ref 0.0–0.2)

## 2020-07-28 LAB — CREATININE, SERUM
Creatinine, Ser: 0.58 mg/dL — ABNORMAL LOW (ref 0.61–1.24)
GFR, Estimated: >60 mL/min (ref >60)

## 2020-07-28 LAB — PROCALCITONIN: Procalcitonin: 0.54 ng/mL

## 2020-07-28 MED ORDER — AMOXICILLIN-POT CLAVULANATE 875-125 MG PO TABS
1.0000 | ORAL_TABLET | Freq: Two times a day (BID) | ORAL | Status: DC
  Administered 2020-07-28 – 2020-07-29 (×2): 1 via ORAL
  Filled 2020-07-28 (×4): qty 1

## 2020-07-28 NOTE — Progress Notes (Signed)
Pt has increase restlessness, he continues to try and get oob and has tried many attempts to  pull at his medical equipment. Redirection effect are unsuccessful at this time, prn Ativan given and the pt remains agitated. Paged NP [place] order given for 2 mg of IV Haldol. I will continue to assess.

## 2020-07-28 NOTE — Progress Notes (Signed)
PROGRESS NOTE    Eric Larsen  YIR:[id number] DOB: 1931/08/16 DOA: 07/24/2020 PCP: [name], MD   Brief Narrative: Eric Larsen is a 86 y.o. male with a history of HSV encephalitis, seizures, dementia. Patient presented from facility secondary to weakness and difficulty walking. Found to have pneumonia likely from aspiration. Empiric antibiotics initiated and patient is improved.   Assessment & Plan:   Principal Problem:   Weakness generalized Active Problems:   Primary hypothyroidism   Hypothyroidism   Dementia associated with other underlying disease without behavioral disturbance (HCC)   Subdural hematoma (HCC)   Weakness   Generalized weakness Possibly from dehydration. Patient started on IV fluids -Continue IV fluids -PT/OT eval  Bilateral community acquired pneumonia Aspiration pneumonia Significant increased from admission. Up to 32.3k today. No true fever but has high grade temperature with tmax of 100 F. Blood cultures obtained on admission and are no growth to date. Urine culture with insignificant growth. Chest x-ray with evidence of pneumonia. Procalcitonin trending down with antibiotics -Switch to Augmentin BID  Subdural hematoma Evaluated by neurosurgery with recommendations for no follow-up  Back pain MRI thoracic spine significant for acute thoracic compression fracture. T10 level. Discussed with neurosurgery, Dr. Ronnald Ramp, who recommended no TLSO brace secondary to patient's baseline function in addition to significant kyphosis and mild degree of fracture present.  Hypothyroidism -Continue Synthroid  Seizure disorder -Continue Keppra and Depakote   DVT prophylaxis: SCDs Code Status:   Code Status: DNR Family Communication: None at bedside Disposition Plan: Discharge SNF vs memory care unit. Medically stable for discharge but discharge is pending bed availability.    Consultants:   Neurosurgery  Procedures:    None  Antimicrobials:  Vancomycin IV  Cefepime IV    Subjective: No issues today.   Objective: Vitals:   07/27/20 0501 07/27/20 1329 07/27/20 2202 07/28/20 0614  BP: 110/76 119/66 130/75 127/82  Pulse: 76 80 74 71  Resp: 18 14 18 20   Temp: 98.4 F (36.9 C) 98 F (36.7 C) (!) 97.5 F (36.4 C) 98.3 F (36.8 C)  TempSrc:  Axillary Oral Oral  SpO2:  96% 94% 93%  Weight:      Height:        Intake/Output Summary (Last 24 hours) at 07/28/2020 1148 Last data filed at 07/28/2020 0828 Gross per 24 hour  Intake 160 ml  Output 750 ml  Net -590 ml   Filed Weights   07/24/20 2155  Weight: 53.5 kg    Examination:  General exam: Appears calm and comfortable. Much stronger cough Respiratory system: Diminished. Respiratory effort normal. Cardiovascular system: S1 & S2 heard, RRR. No murmurs, rubs, gallops or clicks. Gastrointestinal system: Abdomen is nondistended, soft and nontender. No organomegaly or masses felt. Normal bowel sounds heard. Central nervous system: Alert. Musculoskeletal: No edema. No calf tenderness Skin: No cyanosis. No rashes Psychiatry: Judgement and insight appear normal. Mood & affect appropriate.     Data Reviewed: I have personally reviewed following labs and imaging studies  CBC Lab Results  Component Value Date   WBC 10.0 07/28/2020   RBC 3.48 (L) 07/28/2020   HGB 11.6 (L) 07/28/2020   HCT 35.0 (L) 07/28/2020   MCV 100.6 (H) 07/28/2020   MCH 33.3 07/28/2020   PLT 193 07/28/2020   MCHC 33.1 07/28/2020   RDW 20.1 (H) 07/28/2020   LYMPHSABS 2.0 07/26/2020   MONOABS 2.4 (H) 07/26/2020   EOSABS 0.0 07/26/2020   BASOSABS 0.1 50/04/3817     Last metabolic  panel Lab Results  Component Value Date   NA 142 07/26/2020   K 3.9 07/26/2020   CL 108 07/26/2020   CO2 25 07/26/2020   BUN 24 (H) 07/26/2020   CREATININE 0.58 (L) 07/28/2020   GLUCOSE 149 (H) 07/26/2020   GFRNONAA >60 07/28/2020   GFRAA >60 11/13/2019   CALCIUM 8.9  07/26/2020   PROT 6.2 (L) 07/26/2020   ALBUMIN 3.2 (L) 07/26/2020   BILITOT 2.8 (H) 07/26/2020   ALKPHOS 53 07/26/2020   AST 23 07/26/2020   ALT 16 07/26/2020   ANIONGAP 9 07/26/2020    CBG (last 3)  No results for input(s): GLUCAP in the last 72 hours.   GFR: Estimated Creatinine Clearance: 49.2 mL/min (A) (by C-G formula based on SCr of 0.58 mg/dL (L)).  Coagulation Profile: No results for input(s): INR, PROTIME in the last 168 hours.  Recent Results (from the past 240 hour(s))  Urine culture     Status: Abnormal   Collection Time: 07/24/20 10:13 PM   Specimen: Urine, Clean Catch  Result Value Ref Range Status   Specimen Description   Final    URINE, CLEAN CATCH Performed at [facility], [place] [address], [place], [place] [address]    Special Requests   Final    NONE Performed at [facility], [place] [address], [place], [place] [address]    Culture (A)  Final    <10,000 COLONIES/mL INSIGNIFICANT GROWTH Performed at [place] [address], [place], [place] [address]    Report Status 07/26/2020 FINAL  Final  Resp Panel by RT-PCR (Flu A&B, Covid) Nasopharyngeal Swab     Status: None   Collection Time: 07/24/20 10:13 PM   Specimen: Nasopharyngeal Swab; Nasopharyngeal(NP) swabs in vial transport medium  Result Value Ref Range Status   SARS Coronavirus 2 by RT PCR NEGATIVE NEGATIVE Final    Comment: (NOTE) SARS-CoV-2 target nucleic acids are NOT DETECTED.  The SARS-CoV-2 RNA is generally detectable in upper respiratory specimens during the acute phase of infection. The lowest concentration of SARS-CoV-2 viral copies this assay can detect is 138 copies/mL. A negative result does not preclude SARS-Cov-2 infection and should not be used as the sole basis for treatment or other patient management decisions. A negative result may occur with  improper specimen collection/handling, submission of specimen other than nasopharyngeal  swab, presence of viral mutation(s) within the areas targeted by this assay, and inadequate number of viral copies(<138 copies/mL). A negative result must be combined with clinical observations, patient history, and epidemiological information. The expected result is Negative.  Fact Sheet for Patients:  EntrepreneurPulse.com.au  Fact Sheet for Healthcare Providers:  IncredibleEmployment.be  This test is no t yet approved or cleared by the [place] FDA and  has been authorized for detection and/or diagnosis of SARS-CoV-2 by FDA under an Emergency Use Authorization (EUA). This EUA will remain  in effect (meaning this test can be used) for the duration of the COVID-19 declaration under Section 564(b)(1) of the Act, 21 U.S.C.section 360bbb-3(b)(1), unless the authorization is terminated  or revoked sooner.       Influenza A by PCR NEGATIVE NEGATIVE Final   Influenza B by PCR NEGATIVE NEGATIVE Final    Comment: (NOTE) The Xpert Xpress SARS-CoV-2/FLU/RSV plus assay is intended as an aid in the diagnosis of influenza from Nasopharyngeal swab specimens and should not be used as a sole basis for treatment. Nasal washings and aspirates are unacceptable for Xpert Xpress SARS-CoV-2/FLU/RSV  testing.  Fact Sheet for Patients: EntrepreneurPulse.com.au  Fact Sheet for Healthcare Providers: IncredibleEmployment.be  This test is not yet approved or cleared by the [place] FDA and has been authorized for detection and/or diagnosis of SARS-CoV-2 by FDA under an Emergency Use Authorization (EUA). This EUA will remain in effect (meaning this test can be used) for the duration of the COVID-19 declaration under Section 564(b)(1) of the Act, 21 U.S.C. section 360bbb-3(b)(1), unless the authorization is terminated or revoked.  Performed at [facility], [place] [address] [place], [place] [address]    Culture, blood (routine x 2)     Status: None (Preliminary result)   Collection Time: 07/25/20  7:40 AM   Specimen: BLOOD  Result Value Ref Range Status   Specimen Description   Final    BLOOD LEFT ARM Performed at [place] [address] [place], [place] [address]    Special Requests   Final    BOTTLES DRAWN AEROBIC AND ANAEROBIC Blood Culture adequate volume Performed at [place] [address], [place], [place] [address]    Culture  Setup Time PENDING  Incomplete   Culture   Final    NO GROWTH 2 DAYS Performed at [facility], [place] [address], [place], [place] [address]    Report Status PENDING  Incomplete  Culture, blood (routine x 2)     Status: None (Preliminary result)   Collection Time: 07/25/20  7:47 AM   Specimen: BLOOD  Result Value Ref Range Status   Specimen Description   Final    BLOOD RIGHT ARM Performed at [place] [address], [place], [place] [address]    Special Requests   Final    BOTTLES DRAWN AEROBIC AND ANAEROBIC Blood Culture results may not be optimal due to an inadequate volume of blood received in culture bottles Performed at [place] [address], [place], [place] [address]    Culture   Final    NO GROWTH 2 DAYS Performed at [facility] [address], [place], [place] [address]    Report Status PENDING  Incomplete  MRSA PCR Screening     Status: None   Collection Time: 07/27/20  2:31 PM   Specimen: Nasopharyngeal  Result Value Ref Range Status   MRSA by PCR NEGATIVE NEGATIVE Final    Comment:        The GeneXpert MRSA Assay (FDA approved for NASAL specimens only), is one component of a comprehensive MRSA colonization surveillance program. It is not intended to diagnose MRSA infection nor to guide or monitor treatment for MRSA infections. Performed at [facility], [place] [address], [place], [place]  [address]         Radiology Studies: DG CHEST PORT 1 VIEW  Result Date: 07/26/2020 CLINICAL DATA:  Cough.  Leukocytosis. EXAM: PORTABLE CHEST 1 VIEW COMPARISON:  CT 07/25/2020.  Chest x-ray 07/24/2020. FINDINGS: Mediastinum hilar structures normal. Low lung volumes with bibasilar infiltrates suggesting bibasilar pneumonia. No pleural effusion or pneumothorax. Degenerative changes scoliosis thoracic spine. Surgical clips right upper quadrant. IMPRESSION: Low lung volumes with bibasilar infiltrates suggesting bibasilar pneumonia. Electronically Signed   By: [place]   On: 07/26/2020 16:17        Scheduled Meds: . divalproex  250 mg Oral Q12H   And  . divalproex  125 mg Oral Q1400  . feeding supplement  237 mL Oral TID BM  .  folic acid  1 mg Oral Daily  . levETIRAcetam  500 mg Oral BID  . levothyroxine  125 mcg Oral Q0600  . melatonin  3 mg Oral QHS  . multivitamin  15 mL Oral Q1400  . polyethylene glycol  17 g Oral Daily   Continuous Infusions: . ceFEPime (MAXIPIME) IV 2 g (07/28/20 1017)     LOS: 3 days     Cordelia Poche, MD [facility] 07/28/2020, 11:48 AM  If 7PM-7AM, please contact night-coverage [email or web address]

## 2020-07-29 ENCOUNTER — Inpatient Hospital Stay (HOSPITAL_COMMUNITY): Payer: Medicare Other

## 2020-07-29 NOTE — Plan of Care (Signed)
  Problem: Health Behavior/Discharge Planning: Goal: Ability to manage health-related needs will improve Outcome: Progressing   Problem: Clinical Measurements: Goal: Will remain free from infection Outcome: Progressing   Problem: Activity: Goal: Risk for activity intolerance will decrease Outcome: Progressing   Problem: Nutrition: Goal: Adequate nutrition will be maintained Outcome: Progressing   Problem: Pain Managment: Goal: General experience of comfort will improve Outcome: Progressing   Problem: Safety: Goal: Ability to remain free from injury will improve Outcome: Progressing   Problem: Skin Integrity: Goal: Risk for impaired skin integrity will decrease Outcome: Progressing

## 2020-07-29 NOTE — Progress Notes (Signed)
Physical Therapy Treatment Patient Details Name: Eric Larsen MRN: [id number] DOB: 1931-01-01 Today's Date: 07/29/2020    History of Present Illness 86 y.o. 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 Patient fell while going to get COVID booster on 12/6 and since has had increased weakness. Patient also with ~6 week hx of increased back pain with MR of spine showing mild T10 anterior inferior endplate compression fracture.    PT Comments    Pt progressing, incr effort today, able to take pivotal steps to chair. Pt is agreeable and cooperative. Continue to recommend SNF   Follow Up Recommendations  SNF     Equipment Recommendations  None recommended by PT    Recommendations for Other Services       Precautions / Restrictions Precautions Precautions: Fall Precaution Comments: T10 mild compression fx Restrictions Weight Bearing Restrictions: No    Mobility  Bed Mobility Overal bed mobility: Needs Assistance Bed Mobility: Rolling;Sidelying to Sit Rolling: Mod assist Sidelying to sit: Mod assist       General bed mobility comments: requires incr time, hand over hand to facilitate initiation movement. assist to bring LEs off bed and elevate trunk  Transfers Overall transfer level: Needs assistance Equipment used: Rolling walker (2 wheeled) Transfers: Sit to/from [facility] Sit to Stand: Mod assist Stand pivot transfers: Mod assist       General transfer comment: assist with anterior-superior wt shift, cues to incr knee flexion and use of  LEs to power up. assist to balance and maneuver RW, to keep bil hands on walker, able to wt shift, take pivotal steps from bed to recliner  Ambulation/Gait             General Gait Details: pivot steps only   Stairs             Wheelchair Mobility    Modified Rankin (Stroke Patients Only)        Balance                                            Cognition Arousal/Alertness: Awake/alert Behavior During Therapy: WFL for tasks assessed/performed Overall Cognitive Status: History of cognitive impairments - at baseline                                 General Comments: occasional word finding difficulty. follows one step commands with increased time. pleasant and agreeable      Exercises      General Comments        Pertinent Vitals/Pain Pain Assessment: Faces Faces Pain Scale: Hurts a little bit Pain Location: back with initial sitting Pain Descriptors / Indicators: Discomfort;Grimacing Pain Intervention(s): Limited activity within patient's tolerance;Monitored during session;Repositioned    Home Living                      Prior Function            PT Goals (current goals can now be found in the care plan section) Acute Rehab PT Goals Patient Stated Goal: get stronger PT Goal Formulation: With patient Time For Goal Achievement: 08/04/2020 Potential to Achieve Goals: Good Progress towards PT goals: Progressing toward goals    Frequency    Min  2X/week      PT Plan Current plan remains appropriate    Co-evaluation              AM-PAC PT "6 Clicks" Mobility   Outcome Measure  Help needed turning from your back to your side while in a flat bed without using bedrails?: A Lot Help needed moving from lying on your back to sitting on the side of a flat bed without using bedrails?: A Lot Help needed moving to and from a bed to a chair (including a wheelchair)?: A Lot Help needed standing up from a chair using your arms (e.g., wheelchair or bedside chair)?: A Lot Help needed to walk in hospital room?: Total Help needed climbing 3-5 steps with a railing? : Total 6 Click Score: 10    End of Session Equipment Utilized During Treatment: Gait belt Activity Tolerance: Patient tolerated treatment well Patient  left: in chair;with call bell/phone within reach;with chair alarm set   PT Visit Diagnosis: Other abnormalities of gait and mobility (R26.89);Muscle weakness (generalized) (M62.81)     Time: 1207-1224 PT Time Calculation (min) (ACUTE ONLY): 17 min  Charges:  $Therapeutic Activity: 8-22 mins                     [name], PT  Acute Rehab Dept [name]) [phone] Pager [phone]  07/29/2020    [facility] 07/29/2020, 12:49 PM

## 2020-07-29 NOTE — Care Management Important Message (Signed)
Important Message  Patient Details IM Letter given to the Patient. Name: Eric Larsen MRN: [id number] Date of Birth: Apr 21, 1931   Medicare Important Message Given:  Yes     Kerin Salen 07/29/2020, 1:29 PM

## 2020-07-29 NOTE — Progress Notes (Addendum)
Modified Barium Swallow Progress Note  Patient Details  Name: Eric Larsen MRN: [id number] Date of Birth: Jan 19, 1931  Today's Date: 07/29/2020  Modified Barium Swallow completed.  Full report located under Chart Review in the Imaging Section.  Brief recommendations include the following:  Clinical Impression  Patient presents with moderate oral and severe pharyngeal dysphagia with sensorimotor deficits likely due to his dementia.  Grossly weak swallow results in gross retention in pharynx and penetration/aspiration of thin, nectar and honey consistencies without sensation.   Pt is in naturally chin tuck postion due to his kyphosis and did not elevate his head for improved positioning despite verbal/visual cues.  Oral transiting is delayed and weak resulting in premature spillage into pharynx.   Silent laryngeal penetration and aspiration during and after the swallow noted with thin, nectar, honey mixed with retained secretions without clearance.  Portion of all boluses noted to be bubbling in and out of open airway while pt was breathing.   Although puree was not penetrated, retention is gross across all boluses and aspiration of puree may be more caustic due to viscocity.   Pt did not follow directions to cough/clear his throat or swallow due to his dementia. He does participate and is echolalic, mimicked a throat clear only which was ineffective.  Pharyngeal clearance of thin better than all other consistencies.    Recommend palliative consult to establish GOC due to pt's gross dysphagia and inability to clear.  Pt is aspirating secretions chronically.    Feasible option given pt's advanced age, dementia, secretion aspiration and acute on chronic dysphagia may be to allow po with accepted aspiration risks for comfort.  Will follow up to help address dysphagia education.  Pending definitive decision, recommend pt be able to consume thin water after oral care for oral hygieine and comfort.    Swallow Evaluation Recommendations       SLP Diet Recommendations: Other (Comment);NPO (? comfort diet with accepted aspiration risks) pending decisions, recommend thin water after oral care for oral hygiene and comfort       Medication Administration: Via alternative means         SLP spoke to MD in person re: recommendations, concerns.  Will follow up next date.  No family present for education the few times SLP walked by room during the day.              Kathleen Lime, MS [place] Office [phone] Pager [phone]     Macario Golds 07/29/2020,9:32 AM

## 2020-07-29 NOTE — Progress Notes (Signed)
PROGRESS NOTE    Eric Larsen  OEV:[id number] DOB: Sep 22, 1930 DOA: 07/24/2020 PCP: [name], MD   Brief Narrative: Eric Larsen is a 86 y.o. male with a history of HSV encephalitis, seizures, dementia. Patient presented from facility secondary to weakness and difficulty walking. Found to have pneumonia likely from aspiration. Empiric antibiotics initiated and patient is improved.   Assessment & Plan:   Principal Problem:   Weakness generalized Active Problems:   Primary hypothyroidism   Hypothyroidism   Dementia associated with other underlying disease without behavioral disturbance (HCC)   Subdural hematoma (HCC)   Weakness   Generalized weakness Possibly from dehydration. Patient started on IV fluids. PT/OT recommended SNF.  Bilateral community acquired pneumonia Aspiration pneumonia Significant increased from admission. Up to 32.3k today. No true fever but has high grade temperature with tmax of 100 F. Blood cultures obtained on admission and are no growth to date. Urine culture with insignificant growth. Chest x-ray with evidence of pneumonia. Procalcitonin trending down with antibiotics. SLP evaluated with MBS and concern for silent aspiration and high aspiration risk -Continue Augmentin BID -Palliative care consult  Subdural hematoma Evaluated by neurosurgery with recommendations for no follow-up  Back pain MRI thoracic spine significant for acute thoracic compression fracture. T10 level. Discussed with neurosurgery, Dr. Ronnald Ramp, who recommended no TLSO brace secondary to patient's baseline function in addition to significant kyphosis and mild degree of fracture present.  Hypothyroidism -Continue Synthroid  Seizure disorder -Continue Keppra and Depakote   DVT prophylaxis: SCDs Code Status:   Code Status: DNR Family Communication: Wife on telephone (20 minutes) Disposition Plan: Discharge SNF vs memory care unit. Medically stable for  discharge but discharge is pending bed availability.   Consultants:   Neurosurgery  Procedures:   None  Antimicrobials:  Vancomycin IV  Cefepime IV    Subjective: No concerns  Objective: Vitals:   07/28/20 0614 07/28/20 1153 07/28/20 2118 07/29/20 0552  BP: 127/82 106/68 121/74 124/71  Pulse: 71 93 (!) 109 77  Resp: 20 20 18 18   Temp: 98.3 F (36.8 C) 97.8 F (36.6 C) 98.8 F (37.1 C) 98.1 F (36.7 C)  TempSrc: Oral  Oral Oral  SpO2: 93% 95% 94% 94%  Weight:      Height:        Intake/Output Summary (Last 24 hours) at 07/29/2020 1444 Last data filed at 07/29/2020 0600 Gross per 24 hour  Intake 240 ml  Output 1030 ml  Net -790 ml   Filed Weights   07/24/20 2155  Weight: 53.5 kg    Examination:  General exam: Appears calm and comfortable Respiratory system: Diminished. Respiratory effort normal. Cardiovascular system: S1 & S2 heard, RRR. No murmurs, rubs, gallops or clicks. Gastrointestinal system: Abdomen is nondistended, soft and nontender. No organomegaly or masses felt. Normal bowel sounds heard. Central nervous system: Alert. Musculoskeletal: No edema. No calf tenderness Skin: No cyanosis. No rashes Psychiatry: Judgement and insight appear normal. Blunt affect    Data Reviewed: I have personally reviewed following labs and imaging studies  CBC Lab Results  Component Value Date   WBC 10.0 07/28/2020   RBC 3.48 (L) 07/28/2020   HGB 11.6 (L) 07/28/2020   HCT 35.0 (L) 07/28/2020   MCV 100.6 (H) 07/28/2020   MCH 33.3 07/28/2020   PLT 193 07/28/2020   MCHC 33.1 07/28/2020   RDW 20.1 (H) 07/28/2020   LYMPHSABS 2.0 07/26/2020   MONOABS 2.4 (H) 07/26/2020   EOSABS 0.0 07/26/2020   BASOSABS 0.1 07/26/2020  Last metabolic panel Lab Results  Component Value Date   NA 142 07/26/2020   K 3.9 07/26/2020   CL 108 07/26/2020   CO2 25 07/26/2020   BUN 24 (H) 07/26/2020   CREATININE 0.58 (L) 07/28/2020   GLUCOSE 149 (H) 07/26/2020    GFRNONAA >60 07/28/2020   GFRAA >60 11/13/2019   CALCIUM 8.9 07/26/2020   PROT 6.2 (L) 07/26/2020   ALBUMIN 3.2 (L) 07/26/2020   BILITOT 2.8 (H) 07/26/2020   ALKPHOS 53 07/26/2020   AST 23 07/26/2020   ALT 16 07/26/2020   ANIONGAP 9 07/26/2020    CBG (last 3)  No results for input(s): GLUCAP in the last 72 hours.   GFR: Estimated Creatinine Clearance: 49.2 mL/min (A) (by C-G formula based on SCr of 0.58 mg/dL (L)).  Coagulation Profile: No results for input(s): INR, PROTIME in the last 168 hours.  Recent Results (from the past 240 hour(s))  Urine culture     Status: Abnormal   Collection Time: 07/24/20 10:13 PM   Specimen: Urine, Clean Catch  Result Value Ref Range Status   Specimen Description   Final    URINE, CLEAN CATCH Performed at [facility], [place] [address] [place], [place] [address]    Special Requests   Final    NONE Performed at [facility], [place] [address] [name], [place] [address]    Culture (A)  Final    <10,000 COLONIES/mL INSIGNIFICANT GROWTH Performed at [place] [address] [place], [place] [address]    Report Status 07/26/2020 FINAL  Final  Resp Panel by RT-PCR (Flu A&B, Covid) Nasopharyngeal Swab     Status: None   Collection Time: 07/24/20 10:13 PM   Specimen: Nasopharyngeal Swab; Nasopharyngeal(NP) swabs in vial transport medium  Result Value Ref Range Status   SARS Coronavirus 2 by RT PCR NEGATIVE NEGATIVE Final    Comment: (NOTE) SARS-CoV-2 target nucleic acids are NOT DETECTED.  The SARS-CoV-2 RNA is generally detectable in upper respiratory specimens during the acute phase of infection. The lowest concentration of SARS-CoV-2 viral copies this assay can detect is 138 copies/mL. A negative result does not preclude SARS-Cov-2 infection and should not be used as the sole basis for treatment or other patient management decisions. A negative result may occur with  improper specimen  collection/handling, submission of specimen other than nasopharyngeal swab, presence of viral mutation(s) within the areas targeted by this assay, and inadequate number of viral copies(<138 copies/mL). A negative result must be combined with clinical observations, patient history, and epidemiological information. The expected result is Negative.  Fact Sheet for Patients:  EntrepreneurPulse.com.au  Fact Sheet for Healthcare Providers:  IncredibleEmployment.be  This test is no t yet approved or cleared by the [place] FDA and  has been authorized for detection and/or diagnosis of SARS-CoV-2 by FDA under an Emergency Use Authorization (EUA). This EUA will remain  in effect (meaning this test can be used) for the duration of the COVID-19 declaration under Section 564(b)(1) of the Act, 21 U.S.C.section 360bbb-3(b)(1), unless the authorization is terminated  or revoked sooner.       Influenza A by PCR NEGATIVE NEGATIVE Final   Influenza B by PCR NEGATIVE NEGATIVE Final    Comment: (NOTE) The Xpert Xpress SARS-CoV-2/FLU/RSV plus assay is intended as an aid in the diagnosis of influenza from Nasopharyngeal swab specimens and should not be used as a sole basis for treatment. Nasal washings and aspirates are unacceptable for Xpert  Xpress SARS-CoV-2/FLU/RSV testing.  Fact Sheet for Patients: EntrepreneurPulse.com.au  Fact Sheet for Healthcare Providers: IncredibleEmployment.be  This test is not yet approved or cleared by the [place] FDA and has been authorized for detection and/or diagnosis of SARS-CoV-2 by FDA under an Emergency Use Authorization (EUA). This EUA will remain in effect (meaning this test can be used) for the duration of the COVID-19 declaration under Section 564(b)(1) of the Act, 21 U.S.C. section 360bbb-3(b)(1), unless the authorization is terminated or revoked.  Performed at [facility], [place] [address], [place], [place] [address]   Culture, blood (routine x 2)     Status: None (Preliminary result)   Collection Time: 07/25/20  7:40 AM   Specimen: BLOOD  Result Value Ref Range Status   Specimen Description   Final    BLOOD LEFT ARM Performed at [place] [address] [place], [place] [address]    Special Requests   Final    BOTTLES DRAWN AEROBIC AND ANAEROBIC Blood Culture adequate volume Performed at [place] [address] [place], [place] [address]    Culture   Final    NO GROWTH 4 DAYS Performed at [facility], [place] [address], [place], [place] [address]    Report Status PENDING  Incomplete  Culture, blood (routine x 2)     Status: None (Preliminary result)   Collection Time: 07/25/20  7:47 AM   Specimen: BLOOD  Result Value Ref Range Status   Specimen Description   Final    BLOOD RIGHT ARM Performed at [facility] [address] [place], [place] [address]    Special Requests   Final    BOTTLES DRAWN AEROBIC AND ANAEROBIC Blood Culture results may not be optimal due to an inadequate volume of blood received in culture bottles Performed at [place] [address], [place], [place] [address]    Culture   Final    NO GROWTH 4 DAYS Performed at [facility], [place] [address], [place], [place] [address]    Report Status PENDING  Incomplete  MRSA PCR Screening     Status: None   Collection Time: 07/27/20  2:31 PM   Specimen: Nasopharyngeal  Result Value Ref Range Status   MRSA by PCR NEGATIVE NEGATIVE Final    Comment:        The GeneXpert MRSA Assay (FDA approved for NASAL specimens only), is one component of a comprehensive MRSA colonization surveillance program. It is not intended to diagnose MRSA infection nor to guide or monitor treatment for MRSA infections. Performed at [facility], [place]  [address] [place], [place] [address]         Radiology Studies: DG Swallowing Func-Speech Pathology  Result Date: 07/29/2020 Objective Swallowing Evaluation: Type of Study: MBS-Modified Barium Swallow Study  Patient Details Name: Christion Leonhard MRN: [id number] Date of Birth: Apr 07, 1931 Today's Date: 07/29/2020 Time: SLP Start Time (ACUTE ONLY): 0820 -SLP Stop Time (ACUTE ONLY): 0845 SLP Time Calculation (min) (ACUTE ONLY): 25 min Past Medical History: Past Medical History: Diagnosis Date . Cognitive decline  . Hypothyroidism  . Osteoporosis  . Thyroid disease  Past Surgical History: Past Surgical History: Procedure Laterality Date . CHOLECYSTECTOMY   . HIP FRACTURE SURGERY   . INTRAMEDULLARY (IM) NAIL INTERTROCHANTERIC Right 08/13/2017  Procedure: INTRAMEDULLARY (IM) NAIL INTERTROCHANTRIC;  Surgeon: Earnestine Leys, MD;  Location: [facility] ORS;  Service: Orthopedics;  Laterality: Right; . SHOULDER SURGERY  HPI: 86 y.o. 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 Patient fell while going to get COVID booster on 12/6 and since has had increased weakness. Patient also with ~6 week hx of increased back pain with MR of spine showing mild T10 anterior inferior endplate compression fracture. CXR shows PNA. Patient has an MBS in 11/2019 during CIR admission which recommended dysphagia 2 with nectar thick liquids, possible water protocol. Per wife, pateint advanced by SLP sometime in mid april after d/c.  Pt with decrease ambulation ability and is high fall risk.  He has been using rollator. Per review of chart, pt with presipitous decline over the 4 months per palliative care note in pt's chart from visit 03/28/2020.  Subjective: pt awake in chair Assessment / Plan / Recommendation CHL IP CLINICAL IMPRESSIONS 07/29/2020  Patient presents with moderate oral and severe pharyngeal dysphagia with sensorimotor deficits likely  due to his dementia.  Grossly weak swallow results in gross retention in pharynx and penetration/aspiration of thin, nectar and honey consistencies without sensation.   Pt is in naturally chin tuck postion due to his kyphosis and did not elevate his head for improved positioning despite verbal/visual cues.  Oral transiting is delayed and weak resulting in premature spillage into pharynx. Silent laryngeal penetration and aspiration during and after the swallow noted with thin, nectar, honey mixed with retained secretions without clearance.  Portion of all boluses noted to be bubbling in and out of open airway while pt was breathing.   Although puree was not penetrated, retention is gross across all boluses and aspiration of puree may be more caustic due to viscocity. Pt did not follow directions to cough/clear his throat or swallow due to his dementia and this severely limits options for compensations. He does participate and is echolalic, mimicked a throat clear only which was ineffective.  Pharyngeal clearance of thin better than all other consistencies.  Recommend palliative consult to establish GOC due to pt's gross dysphagia and inability to clear.  Pt is aspirating secretions chronically.  Feasible option given pt's advanced age, dementia, secretion aspiration and acute on chronic dysphagia may be to allow po with accepted aspiration risks for comfort.  Will follow up to help address dysphagia education.  Pending definitive decision, recommend pt be able to consume thin water after oral care for oral hygieine and comfort.  Swallow Evaluation Recommendations    SLP Diet Recommendations: Other (Comment);NPO (? comfort diet with accepted aspiration risks) pending decisions, recommend thin water after oral care for oral hygiene and comfort    Medication Administration: Via alternative means  SLP Visit Diagnosis Dysphagia, oropharyngeal phase (R13.12) Attention and concentration deficit following -- Frontal lobe and  executive function deficit following -- Impact on safety and function Severe aspiration risk;Risk for inadequate nutrition/hydration   CHL IP TREATMENT RECOMMENDATION 07/29/2020 Treatment Recommendations Therapy as outlined in treatment plan below   Prognosis 10/15/2019 Prognosis for Safe Diet Advancement Fair Barriers to Reach Goals Cognitive deficits;Language deficits Barriers/Prognosis Comment -- CHL IP DIET RECOMMENDATION 07/29/2020 SLP Diet Recommendations Other (Comment);NPO Liquid Administration via -- Medication Administration Via alternative means Compensations -- Postural Changes --             CHL IP FOLLOW UP RECOMMENDATIONS 07/29/2020 Follow up Recommendations None   CHL IP FREQUENCY AND DURATION 07/29/2020 Speech Therapy Frequency (ACUTE ONLY) min 1 x/week Treatment Duration 1 week      CHL IP ORAL  PHASE 07/29/2020 Oral Phase Impaired Oral - Pudding Teaspoon -- Oral - Pudding Cup -- Oral - Honey Teaspoon Weak lingual manipulation;Delayed oral transit;Premature spillage;Decreased bolus cohesion;Lingual pumping;Reduced posterior propulsion Oral - Honey Cup -- Oral - Nectar Teaspoon Reduced posterior propulsion;Weak lingual manipulation;Lingual/palatal residue;Premature spillage;Decreased bolus cohesion;Delayed oral transit Oral - Nectar Cup Premature spillage;Weak lingual manipulation;Reduced posterior propulsion;Decreased bolus cohesion;Lingual pumping;Delayed oral transit Oral - Nectar Straw Lingual pumping;Reduced posterior propulsion;Delayed oral transit;Decreased bolus cohesion;Premature spillage;Weak lingual manipulation Oral - Thin Teaspoon Weak lingual manipulation;Delayed oral transit;Premature spillage;Decreased bolus cohesion;Reduced posterior propulsion;Lingual pumping Oral - Thin Cup Delayed oral transit;Decreased bolus cohesion;Premature spillage;Reduced posterior propulsion;Weak lingual manipulation;Lingual pumping Oral - Thin Straw Decreased bolus cohesion;Delayed oral transit;Reduced  posterior propulsion;Premature spillage;Weak lingual manipulation;Lingual pumping Oral - Puree Weak lingual manipulation;Delayed oral transit;Reduced posterior propulsion;Lingual pumping;Decreased bolus cohesion;Premature spillage Oral - Mech Soft NT Oral - Regular NT Oral - Multi-Consistency NT Oral - Pill NT Oral Phase - Comment use of spoon pressure to tongue did not triggers swallow, use of straw improved oral transit as it placed boluses posterior in oral cavity to allow more efficient spillage into pharynx  CHL IP PHARYNGEAL PHASE 07/29/2020 Pharyngeal Phase Impaired Pharyngeal- Pudding Teaspoon -- Pharyngeal -- Pharyngeal- Pudding Cup -- Pharyngeal -- Pharyngeal- Honey Teaspoon -- Pharyngeal -- Pharyngeal- Honey Cup -- Pharyngeal -- Pharyngeal- Nectar Teaspoon Delayed swallow initiation-pyriform sinuses;Reduced laryngeal elevation;Pharyngeal residue - pyriform;Moderate aspiration;Penetration/Aspiration during swallow;Penetration/Apiration after swallow Pharyngeal Material enters airway, passes BELOW cords without attempt by patient to eject out (silent aspiration) Pharyngeal- Nectar Cup Delayed swallow initiation-pyriform sinuses;Reduced airway/laryngeal closure;Moderate aspiration;Pharyngeal residue - pyriform;Penetration/Aspiration during swallow;Penetration/Apiration after swallow Pharyngeal Material enters airway, passes BELOW cords without attempt by patient to eject out (silent aspiration) Pharyngeal- Nectar Straw Delayed swallow initiation-pyriform sinuses;Pharyngeal residue - pyriform Pharyngeal Material enters airway, passes BELOW cords without attempt by patient to eject out (silent aspiration) Pharyngeal- Thin Teaspoon Delayed swallow initiation-pyriform sinuses;Reduced laryngeal elevation;Pharyngeal residue - pyriform;Moderate aspiration;Penetration/Aspiration during swallow;Penetration/Apiration after swallow Pharyngeal Material enters airway, passes BELOW cords without attempt by patient to  eject out (silent aspiration) Pharyngeal- Thin Cup Reduced laryngeal elevation;Moderate aspiration;Pharyngeal residue - pyriform;Penetration/Aspiration during swallow;Penetration/Apiration after swallow;Delayed swallow initiation-vallecula Pharyngeal Material enters airway, passes BELOW cords without attempt by patient to eject out (silent aspiration) Pharyngeal- Thin Straw Reduced laryngeal elevation;Pharyngeal residue - pyriform;Moderate aspiration;Penetration/Aspiration during swallow;Penetration/Apiration after swallow;Delayed swallow initiation-vallecula Pharyngeal Material enters airway, passes BELOW cords without attempt by patient to eject out (silent aspiration) Pharyngeal- Puree Delayed swallow initiation-vallecula;Pharyngeal residue - valleculae;Pharyngeal residue - pyriform Pharyngeal -- Pharyngeal- Mechanical Soft -- Pharyngeal -- Pharyngeal- Regular -- Pharyngeal -- Pharyngeal- Multi-consistency -- Pharyngeal -- Pharyngeal- Pill -- Pharyngeal -- Pharyngeal Comment Pt noted to have secretions retained in oropharynx, Secretions mixed with barium and were aspirated - bubbling in and out of open airway with pt breathing.  Pt did not follow directions to cough/clear his throat or swallow due to his dementia. He does participate and is echolalic, mimicked a throat clear only which was ineffective.  Pharyngeal clearance of thin better than all other consistencies.  CHL IP CERVICAL ESOPHAGEAL PHASE 07/29/2020 Cervical Esophageal Phase Impaired Pudding Teaspoon -- Pudding Cup -- Honey Teaspoon -- Honey Cup -- Nectar Teaspoon -- Nectar Cup -- Nectar Straw -- Thin Teaspoon -- Thin Cup -- Thin Straw -- Puree -- Mechanical Soft -- Regular -- Multi-consistency -- Pill -- Cervical Esophageal Comment -- Kathleen Lime, MS [facility] SLP Acute Rehab Services Office [phone] Pager [phone] Macario Golds 07/29/2020, 9:36 AM  Scheduled Meds: . amoxicillin-clavulanate  1 tablet Oral Q12H  .  divalproex  250 mg Oral Q12H   And  . divalproex  125 mg Oral Q1400  . feeding supplement  237 mL Oral TID BM  . folic acid  1 mg Oral Daily  . levETIRAcetam  500 mg Oral BID  . levothyroxine  125 mcg Oral Q0600  . melatonin  3 mg Oral QHS  . multivitamin  15 mL Oral Q1400  . polyethylene glycol  17 g Oral Daily   Continuous Infusions:    LOS: 4 days     Cordelia Poche, MD [facility] 07/29/2020, 2:44 PM  If 7PM-7AM, please contact night-coverage [email or web address]

## 2020-07-29 NOTE — Progress Notes (Signed)
SLP visit with pt to assure he is adequately alert for MBS. Pt was asleep but he easily awoke, allowed SLP and RN to reposition him and consumed water via cup.  Throat clearing and subtle cough noted x1/2 boluses and pt required extra time to contain enough water in his oral cavity as anterior spillage occurred.  MBS this am, RN and pt informed.   Kathleen Lime, MS [facility] SLP Acute Rehab Services Office [phone] Pager [phone]

## 2020-07-29 NOTE — TOC Initial Note (Signed)
Transition of Care [facility]) - Initial/Assessment Note    Patient Details  Name: Eric Larsen MRN: [id number] Date of Birth: 12-28-1930  Transition of Care [facility]) CM/SW Contact:    Joaquin Courts, RN Phone Number: 07/29/2020, 3:59 PM  Clinical Narrative:                 CM spoke with director at [facility] regarding recommendations for snf to clarify if patient would need to go to SNF first or return to [facility].  Director chares that prior to hospitalization patient was ambulatory with a walker but also shares that she has spoken with spouse who stated patient was aspirating and would be unable to eat and so she was considering hospice services.  Should spouse elect hospice, patient could return to [facility] with hospice services.  If spouse does not wish to take the hospice approach, patient would need to go to SNF first prior to returning to [facility].    CM called and spoke with spouse who shares that she is still in the decision making process, has just received a lot of news and information today and needs time to digest this and think about best course of action.  TOC will follow-up with spouse tomorrow once she has had some time to think about the best options for patient.    Barriers to Discharge: Continued Medical Work up   Patient Goals and CMS Choice Patient states their goals for this hospitalization and ongoing recovery are:: spouse is deciding on best options      Expected Discharge Plan and Services     Discharge Planning Services: CM Consult                                          Prior Living Arrangements/Services   Lives with:: Facility Resident Patient language and need for interpreter reviewed:: Yes        Need for Family Participation in Patient Care: Yes (Comment) Care giver support system in place?: Yes (comment)   Criminal Activity/Legal Involvement Pertinent to Current Situation/Hospitalization: No - Comment as  needed  Activities of Daily Living Home Assistive Devices/Equipment: Eyeglasses,Blood pressure cuff,Grab bars around toilet,Grab bars in shower,Hand-held shower hose,Walker (specify type) (rollator) ADL Screening (condition at time of admission) Patient's cognitive ability adequate to safely complete daily activities?: No Is the patient deaf or have difficulty hearing?: Yes Does the patient have difficulty seeing, even when wearing glasses/contacts?: No Does the patient have difficulty concentrating, remembering, or making decisions?: Yes Patient able to express need for assistance with ADLs?: Yes Does the patient have difficulty dressing or bathing?: Yes Independently performs ADLs?: No Communication: Independent Dressing (OT): Needs assistance Is this a change from baseline?: Pre-admission baseline Grooming: Needs assistance Is this a change from baseline?: Pre-admission baseline Feeding: Needs assistance Is this a change from baseline?: Pre-admission baseline Bathing: Needs assistance Is this a change from baseline?: Pre-admission baseline Toileting: Needs assistance Is this a change from baseline?: Pre-admission baseline In/Out Bed: Needs assistance Is this a change from baseline?: Pre-admission baseline Walks in Home: Needs assistance Is this a change from baseline?: Pre-admission baseline Does the patient have difficulty walking or climbing stairs?: Yes (secondary to weakness) Weakness of Legs: Both Weakness of Arms/Hands: None  Permission Sought/Granted                  Emotional Assessment  Orientation: : Fluctuating Orientation (Suspected and/or reported Sundowners)   Psych Involvement: No (comment)  Admission diagnosis:  Dehydration [E86.0] Weakness generalized [R53.1] Weakness [R53.1] Generalized weakness [R53.1] Fall from standing, initial encounter [W19.XXXA] Constipation, unspecified constipation type [K59.00] Patient Active Problem List    Diagnosis Date Noted  . Weakness generalized 07/25/2020  . Subdural hematoma ([place]) 07/25/2020  . Weakness 07/25/2020  . Dehydration   . Protein-calorie malnutrition, severe 05/25/2020  . Leukocytosis 05/24/2020  . Weight loss, unintentional 05/24/2020  . Abnormality of gait 01/01/2020  . Hypokalemia   . Sleep disturbance   . Dementia associated with other underlying disease without behavioral disturbance ([place])   . Palliative care encounter   . Lethargy   . Acute blood loss anemia   . Hypoalbuminemia due to protein-calorie malnutrition ([place])   . Microcytic anemia   . Dysphagia   . Encephalitis 10/18/2019  . Abnormal EEG   . Hypothyroidism   . Macrocytic anemia   . 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 Hip fracture ([place]) 08/12/2017  . Hallux valgus of left foot 04/28/2017  . Hammer toe of left foot 04/28/2017  . Corn of toe 04/28/2017  . History of total left hip replacement 09/19/2015  . Spherocytosis (familial) ([place]) 09/17/2015  . Mixed hyperlipidemia 06/19/2015  . Benign prostatic hyperplasia with lower urinary tract symptoms 06/19/2015  . History of fracture of vertebra 11/06/2014  . History of colon polyps 08/08/2014  . Pathological fracture 11/01/2013  . Toenail fungus 01/20/2013  . Vitamin D deficiency 09/27/2012  . Primary hypothyroidism 09/27/2012  . Osteoporosis 09/27/2012  . Spherocytosis ([place]) 12/04/2011  . Skin cancer 12/04/2011   PCP:  [name], MD Pharmacy:   [facility] [address] [place] - [place] AT [place] [address] [place] [address] Phone: [phone] Fax: [place], [place] [address] [place] [address] Phone: [phone] Fax: [phone]     Social Determinants of Health (SDOH) Interventions    Readmission Risk Interventions No flowsheet data found.

## 2020-07-30 DIAGNOSIS — Z515 Encounter for palliative care: Secondary | ICD-10-CM

## 2020-07-30 DIAGNOSIS — L8990 Pressure ulcer of unspecified site, unspecified stage: Secondary | ICD-10-CM | POA: Insufficient documentation

## 2020-07-30 DIAGNOSIS — R1310 Dysphagia, unspecified: Secondary | ICD-10-CM

## 2020-07-30 DIAGNOSIS — Z09 Encounter for follow-up examination after completed treatment for conditions other than malignant neoplasm: Secondary | ICD-10-CM

## 2020-07-30 DIAGNOSIS — Z7189 Other specified counseling: Secondary | ICD-10-CM

## 2020-07-30 LAB — GLUCOSE, CAPILLARY: Glucose-Capillary: 105 mg/dL — ABNORMAL HIGH (ref 70–99)

## 2020-07-30 LAB — CREATININE, SERUM
Creatinine, Ser: 0.82 mg/dL (ref 0.61–1.24)
GFR, Estimated: >60 mL/min (ref >60)

## 2020-07-30 LAB — CULTURE, BLOOD (ROUTINE X 2): Culture: NO GROWTH

## 2020-07-30 MED ORDER — LEVETIRACETAM IN NACL 500 MG/100ML IV SOLN
500.0000 mg | Freq: Two times a day (BID) | INTRAVENOUS | Status: DC
  Administered 2020-07-30 – 2020-08-03 (×8): 500 mg via INTRAVENOUS
  Filled 2020-07-30 (×8): qty 100

## 2020-07-30 MED ORDER — SODIUM CHLORIDE 0.9 % IV SOLN
3.0000 g | Freq: Four times a day (QID) | INTRAVENOUS | Status: DC
  Administered 2020-07-30 – 2020-07-31 (×5): 3 g via INTRAVENOUS
  Filled 2020-07-30 (×2): qty 8
  Filled 2020-07-30 (×2): qty 3
  Filled 2020-07-30 (×2): qty 8

## 2020-07-30 MED ORDER — VALPROATE SODIUM 500 MG/5ML IV SOLN
125.0000 mg | Freq: Every day | INTRAVENOUS | Status: DC
  Administered 2020-07-30 – 2020-08-02 (×4): 125 mg via INTRAVENOUS
  Filled 2020-07-30 (×5): qty 1.25

## 2020-07-30 MED ORDER — VALPROATE SODIUM 500 MG/5ML IV SOLN
250.0000 mg | Freq: Two times a day (BID) | INTRAVENOUS | Status: DC
  Administered 2020-07-30 – 2020-08-02 (×7): 250 mg via INTRAVENOUS
  Filled 2020-07-30 (×10): qty 2.5

## 2020-07-30 NOTE — Progress Notes (Signed)
PHARMACY - PHYSICIAN COMMUNICATION CRITICAL VALUE ALERT - BLOOD CULTURE IDENTIFICATION (BCID)  Eric Larsen is an 86 y.o. male who presented to [facility] on 07/24/2020 with a chief complaint of weakness and fever. He's currently on Augmentin for PNA. One of four blood cx bottles from 12/9 came back with GPR. Micro lab does not run BCID for [employer].   Name of physician (or Provider) Contacted: Dr. Georgena Spurling  Current antibiotics: augmentin  Changes to prescribed antibiotics recommended:  - Since GPRs are in a single set, it's likely a contaminant. Recom. not to treat at this time.  No results found for this or any previous visit.  Lynelle Doctor 07/30/2020  11:30 AM

## 2020-07-30 NOTE — Progress Notes (Signed)
RN notified Dr. Lonny Prude that pt continues to pull off telemetry monitor. RN and CNA attempt to put it back on and pt continues to pull it off. Central tele notified also.

## 2020-07-30 NOTE — Consult Note (Signed)
Consultation Note Date: 07/30/2020   Patient Name: Eric Larsen  DOB: 08/19/1930  MRN: [id number]  Age / Sex: 86 y.o., male  PCP: [name], MD Referring Physician: Mariel Aloe, MD  Reason for Consultation: Establishing goals of care  HPI/Patient Profile: 86 y.o. 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 Found to have aspiration pneumonia, dehydration, T10 compression fracture. Continues with severe aspiration risk and currently NPO status.   Clinical Assessment and Goals of Care: I met today with Mr. Hoobler bedside with his wife, [name]. [name] shares with me that Mr. Lawler has had gradual decline through the entire year with weight loss and overall decline. He shows signs of sundowning and overall cognitive decline with dementia. He was walking with walker and [name] was helping to make sure that he was eating as well as possible. Unfortunately his swallowing function has continued to decline. [name] is clear in her goals for him to be comfortable. She does not wish for him to suffer at all. He has lost his dignity and quality of life is declining. We did discuss hospice options and she is most interested in [facility]. She is anxious for further discussion with his children and hoping that they will agree with goals.  I met with her again along with Dr. Lonny Prude to call and speak with daughter, Jonelle Sidle, but we could not reach Tiffany at this time. We reviewed with [name] again goals and concerns. Spent a lot of time providing reassurance and emotional support to [place]. Talked through progression of disease and comfort measures and palliative vs hospice care.   All questions/concerns addressed. Emotional support provided  Primary Decision Maker NEXT  OF KIN     SUMMARY OF RECOMMENDATIONS   - DNR - Most likely transitioning to [facility]  Code Status/Advance Care Planning:  DNR   Symptom Management:   Per attending for now.   Anticipate transition to comfort.   Palliative Prophylaxis:   Aspiration, Delirium Protocol, Frequent Pain Assessment and Oral Care  Psycho-social/Spiritual:   Desire for further Chaplaincy support:yes  Additional Recommendations: Education on Hospice and Grief/Bereavement Support  Prognosis:   < 2 weeks  Discharge Planning: Hospice facility      Primary Diagnoses: Present on Admission: . Subdural hematoma ([place]) . Primary hypothyroidism . Hypothyroidism . Dementia associated with other underlying disease without behavioral disturbance ([place])   I have reviewed the medical record, interviewed the patient and family, and examined the patient. The following aspects are pertinent.  Past Medical History:  Diagnosis Date  . Cognitive decline   . Hypothyroidism   . Osteoporosis   . Thyroid disease    Social History   Socioeconomic History  . Marital status: Married    Spouse name: Not on file  . Number of children: Not on file  . Years of education: Not on file  . Highest education level: Not on file  Occupational History  .  Not on file  Tobacco Use  . Smoking status: Former Research scientist (life sciences)  . Smokeless tobacco: Never Used  . Tobacco comment: Quit approximately 22 years ago  Substance and Sexual Activity  . Alcohol use: Not Currently    Comment: one vodka tonic every evening  . Drug use: Never  . Sexual activity: Not Currently  Other Topics Concern  . Not on file  Social History Narrative  . Not on file   Social Determinants of Health   Financial Resource Strain: Not on file  Food Insecurity: Not on file  Transportation Needs: Not on file  Physical Activity: Not on file  Stress: Not on file  Social Connections: Not on file   Family History  Problem Relation Age of Onset   . Hereditary spherocytosis Father   . Hereditary spherocytosis Sister    Scheduled Meds: . feeding supplement  237 mL Oral TID BM  . folic acid  1 mg Oral Daily  . melatonin  3 mg Oral QHS  . multivitamin  15 mL Oral Q1400  . polyethylene glycol  17 g Oral Daily   Continuous Infusions: . ampicillin-sulbactam (UNASYN) IV 3 g (07/30/20 1305)  . levETIRAcetam    . levothyroxine    . valproate sodium     And  . valproate sodium 125 mg (07/30/20 1349)   PRN Meds:.acetaminophen **OR** acetaminophen, lidocaine, LORazepam Allergies  Allergen Reactions  . Pollen Extract Itching and Other (See Comments)    On MAR- Itchy eyes, runny nose, nasal congestion   Review of Systems  Unable to perform ROS: Acuity of condition    Physical Exam Vitals and nursing note reviewed.  Cardiovascular:     Rate and Rhythm: Normal rate.  Pulmonary:     Effort: Pulmonary effort is normal. No tachypnea, accessory muscle usage or respiratory distress.  Abdominal:     General: Abdomen is flat.  Neurological:     Mental Status: He is alert. He is confused.     Comments: Oriented to person only     Vital Signs: BP 120/71 (BP Location: Right Arm)   Pulse 85   Temp 98.5 F (36.9 C) (Oral)   Resp 16   Ht '5\' 3"'  (1.6 m)   Wt 53.5 kg   SpO2 95%   BMI 20.90 kg/m  Pain Scale: 0-10   Pain Score: 0-No pain   SpO2: SpO2: 95 % O2 Device:SpO2: 95 % O2 Flow Rate: .   IO: Intake/output summary:   Intake/Output Summary (Last 24 hours) at 07/30/2020 1416 Last data filed at 07/30/2020 1243 Gross per 24 hour  Intake 0 ml  Output 550 ml  Net -550 ml    LBM: Last BM Date:  (unknown) Baseline Weight: Weight: 53.5 kg Most recent weight: Weight: 53.5 kg     Palliative Assessment/Data:     Time In/Out: 1500-1540, 1620-1710 Time Total: 90 min Greater than 50%  of this time was spent counseling and coordinating care related to the above assessment and plan.  Signed by: Vinie Sill,  NP Palliative Medicine Team Pager # [phone] (M-F 8a-5p) Team Phone # [phone] (Nights/Weekends)

## 2020-07-30 NOTE — Progress Notes (Addendum)
PROGRESS NOTE    Eric Larsen  WUJ:[id number] DOB: [birth year] DOA: 07/24/2020 PCP: [name], MD   Brief Narrative: Eric Larsen is a 86 y.o. male with a history of HSV encephalitis, seizures, dementia. Patient presented from facility secondary to weakness and difficulty walking. Found to have pneumonia likely from aspiration. Empiric antibiotics initiated and patient is improved.   Assessment & Plan:   Principal Problem:   Weakness generalized Active Problems:   Primary hypothyroidism   Hypothyroidism   Dementia associated with other underlying disease without behavioral disturbance (HCC)   Subdural hematoma (HCC)   Weakness   Pressure injury of skin   Generalized weakness Possibly from dehydration. Patient started on IV fluids. PT/OT recommended SNF.  Bilateral community acquired pneumonia Aspiration pneumonia Significant increased from admission. Up to 32.3k on 12/10. No true fever but has high grade temperature with tmax of 100 F. Blood cultures obtained on admission and are no growth to date. Urine culture with insignificant growth. Chest x-ray with evidence of pneumonia. Procalcitonin trending down with antibiotics. SLP evaluated with MBS and concern for silent aspiration and high aspiration risk -Switch back to Unasyn since NPO -Palliative care consult/recommendations pending  Subdural hematoma Evaluated by neurosurgery with recommendations for no follow-up  Back pain MRI thoracic spine significant for acute thoracic compression fracture. T10 level. Discussed with neurosurgery, Dr. Ronnald Ramp, who recommended no TLSO brace secondary to patient's baseline function in addition to significant kyphosis and mild degree of fracture present.  Hypothyroidism -Continue Synthroid  Seizure disorder -Continue Keppra and Depakote (switch to IV)   DVT prophylaxis: SCDs Code Status:   Code Status: DNR Family Communication: Wife on telephone (20  minutes); Daughter on telephone (20 minutes); wife in goals of care meeting (45 minutes) Disposition Plan: Discharge likely to hospice facility vs memory care with hospice pending continued goals of care discussions/palliative care discussion   Consultants:   Neurosurgery  Palliative care medicine  Procedures:   None  Antimicrobials:  Vancomycin IV  Cefepime IV   Unasyn IV  Augmentin PO   Subjective: No issues today  Objective: Vitals:   07/29/20 0552 07/29/20 1458 07/29/20 2230 07/30/20 0529  BP: 124/71 107/64 117/67 110/63  Pulse: 77 (!) 56 84 73  Resp: 18 18 18 16   Temp: 98.1 F (36.7 C) 98.1 F (36.7 C) 97.7 F (36.5 C) 98.2 F (36.8 C)  TempSrc: Oral Oral Oral Oral  SpO2: 94% 94% 97% 96%  Weight:      Height:        Intake/Output Summary (Last 24 hours) at 07/30/2020 1131 Last data filed at 07/30/2020 0600 Gross per 24 hour  Intake --  Output 350 ml  Net -350 ml   Filed Weights   07/24/20 2155  Weight: 53.5 kg    Examination:  General exam: Appears calm and comfortable. Frail appearing. Respiratory system: Diminished. Respiratory effort normal. Cardiovascular system: S1 & S2 heard, RRR. No murmurs, rubs, gallops or clicks. Gastrointestinal system: Abdomen is nondistended, soft and nontender. No organomegaly or masses felt. Normal bowel sounds heard. Central nervous system: Alert and oriented to person. Musculoskeletal: No edema. No calf tenderness. Kyphotic Skin: No cyanosis. No rashes Psychiatry: Judgement and insight appear normal. Blunt affect   Data Reviewed: I have personally reviewed following labs and imaging studies  CBC Lab Results  Component Value Date   WBC 10.0 07/28/2020   RBC 3.48 (L) 07/28/2020   HGB 11.6 (L) 07/28/2020   HCT 35.0 (L) 07/28/2020   MCV  100.6 (H) 07/28/2020   MCH 33.3 07/28/2020   PLT 193 07/28/2020   MCHC 33.1 07/28/2020   RDW 20.1 (H) 07/28/2020   LYMPHSABS 2.0 07/26/2020   MONOABS 2.4 (H)  07/26/2020   EOSABS 0.0 07/26/2020   BASOSABS 0.1 50/27/7412     Last metabolic panel Lab Results  Component Value Date   NA 142 07/26/2020   K 3.9 07/26/2020   CL 108 07/26/2020   CO2 25 07/26/2020   BUN 24 (H) 07/26/2020   CREATININE 0.82 07/30/2020   GLUCOSE 149 (H) 07/26/2020   GFRNONAA >60 07/30/2020   GFRAA >60 11/13/2019   CALCIUM 8.9 07/26/2020   PROT 6.2 (L) 07/26/2020   ALBUMIN 3.2 (L) 07/26/2020   BILITOT 2.8 (H) 07/26/2020   ALKPHOS 53 07/26/2020   AST 23 07/26/2020   ALT 16 07/26/2020   ANIONGAP 9 07/26/2020    CBG (last 3)  Recent Labs    07/30/20 0803  GLUCAP 105*     GFR: Estimated Creatinine Clearance: 48 mL/min (by C-G formula based on SCr of 0.82 mg/dL).  Coagulation Profile: No results for input(s): INR, PROTIME in the last 168 hours.  Recent Results (from the past 240 hour(s))  Urine culture     Status: Abnormal   Collection Time: 07/24/20 10:13 PM   Specimen: Urine, Clean Catch  Result Value Ref Range Status   Specimen Description   Final    URINE, CLEAN CATCH Performed at [facility], [place] [address] [place], [place] [address]    Special Requests   Final    NONE Performed at [facility], [place] [address] [place], [place] [address]    Culture (A)  Final    <10,000 COLONIES/mL INSIGNIFICANT GROWTH Performed at [place] [address] [place], [place] [address]    Report Status 07/26/2020 FINAL  Final  Resp Panel by RT-PCR (Flu A&B, Covid) Nasopharyngeal Swab     Status: None   Collection Time: 07/24/20 10:13 PM   Specimen: Nasopharyngeal Swab; Nasopharyngeal(NP) swabs in vial transport medium  Result Value Ref Range Status   SARS Coronavirus 2 by RT PCR NEGATIVE NEGATIVE Final    Comment: (NOTE) SARS-CoV-2 target nucleic acids are NOT DETECTED.  The SARS-CoV-2 RNA is generally detectable in upper respiratory specimens during the acute phase of infection. The  lowest concentration of SARS-CoV-2 viral copies this assay can detect is 138 copies/mL. A negative result does not preclude SARS-Cov-2 infection and should not be used as the sole basis for treatment or other patient management decisions. A negative result may occur with  improper specimen collection/handling, submission of specimen other than nasopharyngeal swab, presence of viral mutation(s) within the areas targeted by this assay, and inadequate number of viral copies(<138 copies/mL). A negative result must be combined with clinical observations, patient history, and epidemiological information. The expected result is Negative.  Fact Sheet for Patients:  EntrepreneurPulse.com.au  Fact Sheet for Healthcare Providers:  IncredibleEmployment.be  This test is no t yet approved or cleared by the [place] FDA and  has been authorized for detection and/or diagnosis of SARS-CoV-2 by FDA under an Emergency Use Authorization (EUA). This EUA will remain  in effect (meaning this test can be used) for the duration of the COVID-19 declaration under Section 564(b)(1) of the Act, 21 U.S.C.section 360bbb-3(b)(1), unless the authorization is terminated  or revoked sooner.       Influenza A by PCR NEGATIVE NEGATIVE Final   Influenza B by  PCR NEGATIVE NEGATIVE Final    Comment: (NOTE) The Xpert Xpress SARS-CoV-2/FLU/RSV plus assay is intended as an aid in the diagnosis of influenza from Nasopharyngeal swab specimens and should not be used as a sole basis for treatment. Nasal washings and aspirates are unacceptable for Xpert Xpress SARS-CoV-2/FLU/RSV testing.  Fact Sheet for Patients: EntrepreneurPulse.com.au  Fact Sheet for Healthcare Providers: IncredibleEmployment.be  This test is not yet approved or cleared by the [place] FDA and has been authorized for detection and/or diagnosis of SARS-CoV-2 by FDA under  an Emergency Use Authorization (EUA). This EUA will remain in effect (meaning this test can be used) for the duration of the COVID-19 declaration under Section 564(b)(1) of the Act, 21 U.S.C. section 360bbb-3(b)(1), unless the authorization is terminated or revoked.  Performed at [facility], [place] [address] [place], [place] [address]   Culture, blood (routine x 2)     Status: None (Preliminary result)   Collection Time: 07/25/20  7:40 AM   Specimen: BLOOD  Result Value Ref Range Status   Specimen Description   Final    BLOOD LEFT ARM Performed at [place] [address], [place], [place] [address]    Special Requests   Final    BOTTLES DRAWN AEROBIC AND ANAEROBIC Blood Culture adequate volume Performed at [place] [address], [place], [place] [address]    Culture  Setup Time   Final    GRAM POSITIVE RODS AEROBIC BOTTLE ONLY CRITICAL RESULT CALLED TO, READ BACK BY AND VERIFIED WITH: Shelda Jakes PHARMD, AT 1057 07/30/20 BY [name] Performed at [facility], [place] [address], [place], [place] [address]    Culture GRAM POSITIVE RODS  Final   Report Status PENDING  Incomplete  Culture, blood (routine x 2)     Status: None (Preliminary result)   Collection Time: 07/25/20  7:47 AM   Specimen: BLOOD  Result Value Ref Range Status   Specimen Description   Final    BLOOD RIGHT ARM Performed at [place] [address], [place], [place] [address]    Special Requests   Final    BOTTLES DRAWN AEROBIC AND ANAEROBIC Blood Culture results may not be optimal due to an inadequate volume of blood received in culture bottles Performed at [place] [address] [place], [place] [address]    Culture   Final    NO GROWTH 4 DAYS Performed at [facility], [place] [address] [place], [place] [address]    Report Status PENDING  Incomplete  MRSA PCR Screening      Status: None   Collection Time: 07/27/20  2:31 PM   Specimen: Nasopharyngeal  Result Value Ref Range Status   MRSA by PCR NEGATIVE NEGATIVE Final    Comment:        The GeneXpert MRSA Assay (FDA approved for NASAL specimens only), is one component of a comprehensive MRSA colonization surveillance program. It is not intended to diagnose MRSA infection nor to guide or monitor treatment for MRSA infections. Performed at [facility], [place] [address] [place], [place] [address]         Radiology Studies: DG Swallowing Func-Speech Pathology  Result Date: 07/29/2020 Objective Swallowing Evaluation: Type of Study: MBS-Modified Barium Swallow Study  Patient Details Name: Jabes Primo MRN: [id number] Date of Birth: 08-29-1930 Today's Date: 07/29/2020 Time: SLP Start Time (ACUTE ONLY): 0820 -SLP Stop Time (ACUTE ONLY): 3875  SLP Time Calculation (min) (ACUTE ONLY): 25 min Past Medical History: Past Medical History: Diagnosis Date . Cognitive decline  . Hypothyroidism  . Osteoporosis  . Thyroid disease  Past Surgical History: Past Surgical History: Procedure Laterality Date . CHOLECYSTECTOMY   . HIP FRACTURE SURGERY   . INTRAMEDULLARY (IM) NAIL INTERTROCHANTERIC Right 08/13/2017  Procedure: INTRAMEDULLARY (IM) NAIL INTERTROCHANTRIC;  Surgeon: Earnestine Leys, MD;  Location: [facility] ORS;  Service: Orthopedics;  Laterality: Right; . SHOULDER SURGERY   HPI: 86 y.o. 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 Patient fell while going to get COVID booster on 12/6 and since has had increased weakness. Patient also with ~6 week hx of increased back pain with MR of spine showing mild T10 anterior inferior endplate compression fracture. CXR shows PNA. Patient has an MBS in 11/2019 during CIR admission which recommended dysphagia 2 with nectar thick liquids, possible water protocol. Per wife,  pateint advanced by SLP sometime in mid april after d/c.  Pt with decrease ambulation ability and is high fall risk.  He has been using rollator. Per review of chart, pt with presipitous decline over the 4 months per palliative care note in pt's chart from visit 03/28/2020.  Subjective: pt awake in chair Assessment / Plan / Recommendation CHL IP CLINICAL IMPRESSIONS 07/29/2020  Patient presents with moderate oral and severe pharyngeal dysphagia with sensorimotor deficits likely due to his dementia.  Grossly weak swallow results in gross retention in pharynx and penetration/aspiration of thin, nectar and honey consistencies without sensation.   Pt is in naturally chin tuck postion due to his kyphosis and did not elevate his head for improved positioning despite verbal/visual cues.  Oral transiting is delayed and weak resulting in premature spillage into pharynx. Silent laryngeal penetration and aspiration during and after the swallow noted with thin, nectar, honey mixed with retained secretions without clearance.  Portion of all boluses noted to be bubbling in and out of open airway while pt was breathing.   Although puree was not penetrated, retention is gross across all boluses and aspiration of puree may be more caustic due to viscocity. Pt did not follow directions to cough/clear his throat or swallow due to his dementia and this severely limits options for compensations. He does participate and is echolalic, mimicked a throat clear only which was ineffective.  Pharyngeal clearance of thin better than all other consistencies.  Recommend palliative consult to establish GOC due to pt's gross dysphagia and inability to clear.  Pt is aspirating secretions chronically.  Feasible option given pt's advanced age, dementia, secretion aspiration and acute on chronic dysphagia may be to allow po with accepted aspiration risks for comfort.  Will follow up to help address dysphagia education.  Pending definitive decision,  recommend pt be able to consume thin water after oral care for oral hygieine and comfort.  Swallow Evaluation Recommendations    SLP Diet Recommendations: Other (Comment);NPO (? comfort diet with accepted aspiration risks) pending decisions, recommend thin water after oral care for oral hygiene and comfort    Medication Administration: Via alternative means  SLP Visit Diagnosis Dysphagia, oropharyngeal phase (R13.12) Attention and concentration deficit following -- Frontal lobe and executive function deficit following -- Impact on safety and function Severe aspiration risk;Risk for inadequate nutrition/hydration   CHL IP TREATMENT RECOMMENDATION 07/29/2020 Treatment Recommendations Therapy as outlined in treatment plan below   Prognosis 10/15/2019 Prognosis for Safe Diet Advancement Fair Barriers to  Reach Goals Cognitive deficits;Language deficits Barriers/Prognosis Comment -- CHL IP DIET RECOMMENDATION 07/29/2020 SLP Diet Recommendations Other (Comment);NPO Liquid Administration via -- Medication Administration Via alternative means Compensations -- Postural Changes --             CHL IP FOLLOW UP RECOMMENDATIONS 07/29/2020 Follow up Recommendations None   CHL IP FREQUENCY AND DURATION 07/29/2020 Speech Therapy Frequency (ACUTE ONLY) min 1 x/week Treatment Duration 1 week      CHL IP ORAL PHASE 07/29/2020 Oral Phase Impaired Oral - Pudding Teaspoon -- Oral - Pudding Cup -- Oral - Honey Teaspoon Weak lingual manipulation;Delayed oral transit;Premature spillage;Decreased bolus cohesion;Lingual pumping;Reduced posterior propulsion Oral - Honey Cup -- Oral - Nectar Teaspoon Reduced posterior propulsion;Weak lingual manipulation;Lingual/palatal residue;Premature spillage;Decreased bolus cohesion;Delayed oral transit Oral - Nectar Cup Premature spillage;Weak lingual manipulation;Reduced posterior propulsion;Decreased bolus cohesion;Lingual pumping;Delayed oral transit Oral - Nectar Straw Lingual pumping;Reduced posterior  propulsion;Delayed oral transit;Decreased bolus cohesion;Premature spillage;Weak lingual manipulation Oral - Thin Teaspoon Weak lingual manipulation;Delayed oral transit;Premature spillage;Decreased bolus cohesion;Reduced posterior propulsion;Lingual pumping Oral - Thin Cup Delayed oral transit;Decreased bolus cohesion;Premature spillage;Reduced posterior propulsion;Weak lingual manipulation;Lingual pumping Oral - Thin Straw Decreased bolus cohesion;Delayed oral transit;Reduced posterior propulsion;Premature spillage;Weak lingual manipulation;Lingual pumping Oral - Puree Weak lingual manipulation;Delayed oral transit;Reduced posterior propulsion;Lingual pumping;Decreased bolus cohesion;Premature spillage Oral - Mech Soft NT Oral - Regular NT Oral - Multi-Consistency NT Oral - Pill NT Oral Phase - Comment use of spoon pressure to tongue did not triggers swallow, use of straw improved oral transit as it placed boluses posterior in oral cavity to allow more efficient spillage into pharynx  CHL IP PHARYNGEAL PHASE 07/29/2020 Pharyngeal Phase Impaired Pharyngeal- Pudding Teaspoon -- Pharyngeal -- Pharyngeal- Pudding Cup -- Pharyngeal -- Pharyngeal- Honey Teaspoon -- Pharyngeal -- Pharyngeal- Honey Cup -- Pharyngeal -- Pharyngeal- Nectar Teaspoon Delayed swallow initiation-pyriform sinuses;Reduced laryngeal elevation;Pharyngeal residue - pyriform;Moderate aspiration;Penetration/Aspiration during swallow;Penetration/Apiration after swallow Pharyngeal Material enters airway, passes BELOW cords without attempt by patient to eject out (silent aspiration) Pharyngeal- Nectar Cup Delayed swallow initiation-pyriform sinuses;Reduced airway/laryngeal closure;Moderate aspiration;Pharyngeal residue - pyriform;Penetration/Aspiration during swallow;Penetration/Apiration after swallow Pharyngeal Material enters airway, passes BELOW cords without attempt by patient to eject out (silent aspiration) Pharyngeal- Nectar Straw Delayed swallow  initiation-pyriform sinuses;Pharyngeal residue - pyriform Pharyngeal Material enters airway, passes BELOW cords without attempt by patient to eject out (silent aspiration) Pharyngeal- Thin Teaspoon Delayed swallow initiation-pyriform sinuses;Reduced laryngeal elevation;Pharyngeal residue - pyriform;Moderate aspiration;Penetration/Aspiration during swallow;Penetration/Apiration after swallow Pharyngeal Material enters airway, passes BELOW cords without attempt by patient to eject out (silent aspiration) Pharyngeal- Thin Cup Reduced laryngeal elevation;Moderate aspiration;Pharyngeal residue - pyriform;Penetration/Aspiration during swallow;Penetration/Apiration after swallow;Delayed swallow initiation-vallecula Pharyngeal Material enters airway, passes BELOW cords without attempt by patient to eject out (silent aspiration) Pharyngeal- Thin Straw Reduced laryngeal elevation;Pharyngeal residue - pyriform;Moderate aspiration;Penetration/Aspiration during swallow;Penetration/Apiration after swallow;Delayed swallow initiation-vallecula Pharyngeal Material enters airway, passes BELOW cords without attempt by patient to eject out (silent aspiration) Pharyngeal- Puree Delayed swallow initiation-vallecula;Pharyngeal residue - valleculae;Pharyngeal residue - pyriform Pharyngeal -- Pharyngeal- Mechanical Soft -- Pharyngeal -- Pharyngeal- Regular -- Pharyngeal -- Pharyngeal- Multi-consistency -- Pharyngeal -- Pharyngeal- Pill -- Pharyngeal -- Pharyngeal Comment Pt noted to have secretions retained in oropharynx, Secretions mixed with barium and were aspirated - bubbling in and out of open airway with pt breathing.  Pt did not follow directions to cough/clear his throat or swallow due to his dementia. He does participate and is echolalic, mimicked a throat clear only which was ineffective.  Pharyngeal clearance of thin better than all other consistencies.  CHL IP CERVICAL ESOPHAGEAL PHASE 07/29/2020 Cervical Esophageal Phase  Impaired Pudding Teaspoon -- Pudding Cup -- Honey Teaspoon -- Honey Cup -- Nectar Teaspoon -- Nectar Cup -- Nectar Straw -- Thin Teaspoon -- Thin Cup -- Thin Straw -- Puree -- Mechanical Soft -- Regular -- Multi-consistency -- Pill -- Cervical Esophageal Comment -- Kathleen Lime, MS [facility] SLP Acute Rehab Services Office [phone] Pager [phone] Macario Golds 07/29/2020, 9:36 AM                   Scheduled Meds: . amoxicillin-clavulanate  1 tablet Oral Q12H  . divalproex  250 mg Oral Q12H   And  . divalproex  125 mg Oral Q1400  . feeding supplement  237 mL Oral TID BM  . folic acid  1 mg Oral Daily  . levETIRAcetam  500 mg Oral BID  . melatonin  3 mg Oral QHS  . multivitamin  15 mL Oral Q1400  . polyethylene glycol  17 g Oral Daily   Continuous Infusions: . levothyroxine       LOS: 5 days     Cordelia Poche, MD [facility] 07/30/2020, 11:31 AM  If 7PM-7AM, please contact night-coverage [email or web address]

## 2020-07-30 NOTE — Plan of Care (Signed)
  Problem: Nutrition: Goal: Adequate nutrition will be maintained Outcome: Not Progressing   Problem: Coping: Goal: Level of anxiety will decrease Outcome: Progressing   Problem: Pain Managment: Goal: General experience of comfort will improve Outcome: Progressing

## 2020-07-30 NOTE — Progress Notes (Signed)
Pharmacy: [facility]  Patient's an 86 y.o M currently on augmentin for PNA. He's NPO. Pharmacy has been consulted to change abx to unasyn.  - scr 0.82 (crcl~48)  Plan: - d/c augmentin - start unasyn 3gm IV q6h - Pharmacy will sign off for abx consult.  Reconsult [place] if need further assistance.  Dia Sitter, PharmD, BCPS 07/30/2020 12:23 PM

## 2020-07-31 LAB — CBC
HCT: 38.9 % — ABNORMAL LOW (ref 39.0–52.0)
Hemoglobin: 12.4 g/dL — ABNORMAL LOW (ref 13.0–17.0)
MCH: 32 pg (ref 26.0–34.0)
MCHC: 31.9 g/dL (ref 30.0–36.0)
MCV: 100.5 fL — ABNORMAL HIGH (ref 80.0–100.0)
Platelets: 298 10*3/uL (ref 150–400)
RBC: 3.87 MIL/uL — ABNORMAL LOW (ref 4.22–5.81)
RDW: 21.1 % — ABNORMAL HIGH (ref 11.5–15.5)
WBC: 14 10*3/uL — ABNORMAL HIGH (ref 4.0–10.5)
nRBC: 0 % (ref 0.0–0.2)

## 2020-07-31 LAB — CULTURE, BLOOD (ROUTINE X 2): Special Requests: ADEQUATE

## 2020-07-31 LAB — BASIC METABOLIC PANEL
Anion gap: 12 (ref 5–15)
BUN: 28 mg/dL — ABNORMAL HIGH (ref 8–23)
CO2: 28 mmol/L (ref 22–32)
Calcium: 9 mg/dL (ref 8.9–10.3)
Chloride: 108 mmol/L (ref 98–111)
Creatinine, Ser: 0.84 mg/dL (ref 0.61–1.24)
GFR, Estimated: >60 mL/min (ref >60)
Glucose, Bld: 111 mg/dL — ABNORMAL HIGH (ref 70–99)
Potassium: 3.3 mmol/L — ABNORMAL LOW (ref 3.5–5.1)
Sodium: 148 mmol/L — ABNORMAL HIGH (ref 135–145)

## 2020-07-31 MED ORDER — LORAZEPAM 2 MG/ML IJ SOLN: 0.2500 mg | Freq: Four times a day (QID) | INTRAMUSCULAR | Status: DC | PRN

## 2020-07-31 MED ORDER — POLYVINYL ALCOHOL 1.4 % OP SOLN
1.0000 [drp] | Freq: Four times a day (QID) | OPHTHALMIC | Status: DC | PRN
  Filled 2020-07-31: qty 15

## 2020-07-31 MED ORDER — BIOTENE DRY MOUTH MT LIQD: 15.0000 mL | OROMUCOSAL | Status: DC | PRN

## 2020-07-31 MED ORDER — GLYCOPYRROLATE 1 MG PO TABS
1.0000 mg | ORAL_TABLET | ORAL | Status: DC | PRN
  Filled 2020-07-31: qty 1

## 2020-07-31 MED ORDER — KCL IN DEXTROSE-NACL 20-5-0.45 MEQ/L-%-% IV SOLN
INTRAVENOUS | Status: DC
  Filled 2020-07-31 (×2): qty 1000

## 2020-07-31 MED ORDER — HALOPERIDOL LACTATE 5 MG/ML IJ SOLN
2.0000 mg | Freq: Every day | INTRAMUSCULAR | Status: DC
  Administered 2020-07-31 – 2020-08-01 (×2): 2 mg via INTRAVENOUS
  Filled 2020-07-31 (×2): qty 1

## 2020-07-31 MED ORDER — GLYCOPYRROLATE 0.2 MG/ML IJ SOLN
0.2000 mg | INTRAMUSCULAR | Status: DC | PRN
  Filled 2020-07-31: qty 1

## 2020-07-31 MED ORDER — LEVOTHYROXINE SODIUM 100 MCG/5ML IV SOLN: 62.5000 ug | Freq: Every day | INTRAVENOUS | Status: DC

## 2020-07-31 MED ORDER — ONDANSETRON 4 MG PO TBDP: 4.0000 mg | ORAL_TABLET | Freq: Four times a day (QID) | ORAL | Status: DC | PRN

## 2020-07-31 MED ORDER — HALOPERIDOL LACTATE 5 MG/ML IJ SOLN: 2.0000 mg | Freq: Every evening | INTRAMUSCULAR | Status: DC | PRN

## 2020-07-31 MED ORDER — ONDANSETRON HCL 4 MG/2ML IJ SOLN: 4.0000 mg | Freq: Four times a day (QID) | INTRAMUSCULAR | Status: DC | PRN

## 2020-07-31 NOTE — TOC Progression Note (Signed)
Transition of Care [facility]) - Progression Note    Patient Details  Name: Eric Larsen MRN: [id number] Date of Birth: Jul 19, 1931  Transition of [facility]) CM/SW Contact  Ross Ludwig, [place] Phone Number: 07/31/2020, 5:43 PM  Clinical Narrative:    CSW was informed by palliative that patient's wife would like [facility] for residential hospice.  Patient's wife is aware that there is a wait list.  CSW updated [facility], to add patient to list, CSW to continue to follow patient's progress throughout discharge planning.     Barriers to Discharge: Continued Medical Work up  Expected Discharge Plan and Services     Discharge Planning Services: CM Consult                                           Social Determinants of Health (SDOH) Interventions    Readmission Risk Interventions No flowsheet data found.

## 2020-07-31 NOTE — Progress Notes (Addendum)
[facility] ([facility])  Requested by [facility] to evaluate pt for residential hospice, just waiting on family to confirm they would like [facility].  There is an extensive waiting list. Advised TOC of this as well as this may determine where the family would like to ultimately choose for residential hospice.  Venia Carbon RN, BSN, [facility] Liaison   **[facility] manager confirmed family would like a bed at [facility]. [facility] will reach out to family on 12/16.

## 2020-07-31 NOTE — Progress Notes (Signed)
PROGRESS NOTE    Eric Larsen  MVH:[id number] DOB: 1930-10-22 DOA: 07/24/2020 PCP: [name], MD     Brief Narrative:  Eric Larsen is an 86 y.o. 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 Three days ago, he had gone to get his Covid vaccine but when patient was trying to get out of the car, he had a fall.  Per patient's wife, patient did not lose consciousness and did not hit his head.  Since the Covid vaccination booster, he has been feeling more weak and has been finding it difficult to walk.  Over the last 6 weeks, patient also has been having mid back pain for which patient was started on Lidoderm patch.   In the ER, patient had temperature of 99 F, no neck stiffness, moving all extremities, following commands, alert, awake, generally weak, urine looks discolored but UA does not show any definite infection, Covid test was negative, EKG shows normal sinus rhythm with RBBB.  Since patient was complaining of some abdominal discomfort and CT chest abdomen pelvis was done which shows T1 and and T2 age-indeterminate compression fractures.  CT head was done which showed a small subdural hematoma.  Patient admitted for further management.  Neurosurgery reviewed patient's head CT, thought to be clinically insignificant and wanted no further work-up or treatment.  MRI thoracic spine significant for acute thoracic compression fracture at T10; neurosurgery recommended no TLSO brace secondary to patient's baseline function in addition to significant kyphosis. Due to leukocytosis, patient was started on empiric antibiotics thought to be secondary to bilateral pneumonia, possibly aspiration.  SLP evaluated patient and showed moderate to severe dysphagia.  Recommended for n.p.o.  Palliative care medicine has been involved in establishing goals of  care.  New events last 24 hours / Subjective: Patient is alert, not oriented to place or time.  Could not tell me his wife or daughter's names.  He voices no physical complaints today.  Assessment & Plan:   Principal Problem:   Weakness generalized Active Problems:   Primary hypothyroidism   Hypothyroidism   Dementia associated with other underlying disease without behavioral disturbance (HCC)   Subdural hematoma (HCC)   Weakness   Pressure injury of skin   Generalized weakness -SNF versus hospice pending goals of care conversation  Bilateral community acquired pneumonia, aspiration pneumonia -Continue Unasyn  Subdural hematoma -CT head reviewed by neurosurgery, thought to be clinically insignificant and had no further work-up or treatment recommendation   Acute T10 compression fracture -Neurosurgery recommended no TLSO brace secondary to patient's baseline function in addition to significant kyphosis and mild degree of fracture present  Hypothyroidism -Continue Synthroid  Seizure disorder -Continue Keppra and Depakote  Hypernatremia -Start D5 half-normal saline  Hypokalemia -Replete, trend  Blood culture contamination -Aerobic bottle in one set reveals corynebacterium, likely a contaminant    In agreement with assessment of the pressure ulcer as below:  Pressure Injury 07/29/20 Vertebral column Mid;Posterior Stage 1 -  Intact skin with non-blanchable redness of a localized area usually over a bony prominence. (Active)  07/29/20 1400  Location: Vertebral column  Location Orientation: Mid;Posterior  Staging: Stage 1 -  Intact skin with non-blanchable redness of a localized area usually over a bony prominence.  Wound Description (Comments):   Present on Admission: No     Nutrition Problem: Inadequate oral intake Etiology: poor appetite   DVT prophylaxis:  SCDs Start: 07/25/20 0388  Code Status: DNR Family Communication: No family at bedside   Disposition Plan:  Status is: Inpatient  Remains inpatient appropriate because:Persistent severe electrolyte disturbances and Unsafe d/c plan   Dispo: The patient is from: [facility]              Anticipated d/c is to: SNF vs hospice              Anticipated d/c date is: 1 day              Patient currently is not medically stable to d/c.  Start IV fluid to treat hypernatremia.  Patient remains n.p.o. due to dysphagia.  Goals of care conversation ongoing with palliative care medicine team.  Patient remains hospice appropriate pending family decision.      Consultants:   Neurosurgery  Palliative care medicine  Procedures:   None  Antimicrobials:  Anti-infectives (From admission, onward)   Start     Dose/Rate Route Frequency Ordered Stop   07/30/20 1200  Ampicillin-Sulbactam (UNASYN) 3 g in sodium chloride 0.9 % 100 mL IVPB        3 g 200 mL/hr over 30 Minutes Intravenous Every 6 hours 07/30/20 1150     07/28/20 1800  amoxicillin-clavulanate (AUGMENTIN) 875-125 MG per tablet 1 tablet  Status:  Discontinued        1 tablet Oral Every 12 hours 07/28/20 1153 07/30/20 1150   07/26/20 2200  vancomycin (VANCOREADY) IVPB 750 mg/150 mL  Status:  Discontinued        750 mg 150 mL/hr over 60 Minutes Intravenous Every 24 hours 07/26/20 0800 07/28/20 1133   07/26/20 0900  vancomycin (VANCOCIN) IVPB 1000 mg/200 mL premix        1,000 mg 200 mL/hr over 60 Minutes Intravenous  Once 07/26/20 0800 07/26/20 1007   07/26/20 0830  ceFEPIme (MAXIPIME) 2 g in sodium chloride 0.9 % 100 mL IVPB  Status:  Discontinued        2 g 200 mL/hr over 30 Minutes Intravenous Every 12 hours 07/26/20 0800 07/28/20 1153        Objective: Vitals:   07/30/20 0529 07/30/20 1231 07/30/20 2156 07/31/20 0525  BP: 110/63 120/71 (!) 114/57 124/75  Pulse: 73 85 (!) 109 (!) 103  Resp: 16  18 15   Temp: 98.2 F (36.8 C) 98.5 F (36.9 C) 97.9 F (36.6 C) 98.4 F (36.9 C)  TempSrc: Oral Oral Oral Oral   SpO2: 96% 95% 92% 96%  Weight:      Height:        Intake/Output Summary (Last 24 hours) at 07/31/2020 1034 Last data filed at 07/30/2020 1500 Gross per 24 hour  Intake 150 ml  Output 200 ml  Net -50 ml   Filed Weights   07/24/20 2155  Weight: 53.5 kg    Examination:  General exam: Appears calm and comfortable  Respiratory system: Clear to auscultation. Respiratory effort normal. No respiratory distress.  Cardiovascular system: S1 & S2 heard, tachycardic, regular rhythm. No murmurs. No pedal edema. Gastrointestinal system: Abdomen is nondistended, soft and nontender. Normal bowel sounds heard. Central nervous system: Alert  Extremities: Symmetric in appearance  Skin: No rashes, lesions or ulcers on exposed skin  Psychiatry: Remains confused   Data Reviewed: I have personally reviewed following labs and imaging studies  CBC: Recent Labs  Lab 07/24/20 2213 07/26/20 0605 07/27/20 0605 07/28/20 0526 07/31/20 0552  WBC 16.1* 32.3* 12.1* 10.0 14.0*  NEUTROABS 12.5* 27.2*  --   --   --  HGB 13.8 12.4* 10.2* 11.6* 12.4*  HCT 40.4 37.2* 32.7* 35.0* 38.9*  MCV 100.0 100.5* 106.5* 100.6* 100.5*  PLT 207 187 170 193 010   Basic Metabolic Panel: Recent Labs  Lab 07/24/20 2213 07/26/20 0605 07/28/20 0526 07/30/20 0510 07/31/20 0552  NA 142 142  --   --  148*  K 4.3 3.9  --   --  3.3*  CL 104 108  --   --  108  CO2 27 25  --   --  28  GLUCOSE 143* 149*  --   --  111*  BUN 29* 24*  --   --  28*  CREATININE 0.85 0.83 0.58* 0.82 0.84  CALCIUM 9.6 8.9  --   --  9.0   GFR: Estimated Creatinine Clearance: 46.9 mL/min (by C-G formula based on SCr of 0.84 mg/dL). Liver Function Tests: Recent Labs  Lab 07/24/20 2213 07/26/20 0605  AST 27 23  ALT 17 16  ALKPHOS 58 53  BILITOT 3.2* 2.8*  PROT 7.4 6.2*  ALBUMIN 4.0 3.2*   No results for input(s): LIPASE, AMYLASE in the last 168 hours. No results for input(s): AMMONIA in the last 168 hours. Coagulation  Profile: No results for input(s): INR, PROTIME in the last 168 hours. Cardiac Enzymes: Recent Labs  Lab 07/25/20 0747  CKTOTAL 48*   BNP (last 3 results) No results for input(s): PROBNP in the last 8760 hours. HbA1C: No results for input(s): HGBA1C in the last 72 hours. CBG: Recent Labs  Lab 07/24/20 2220 07/30/20 0803  GLUCAP 133* 105*   Lipid Profile: No results for input(s): CHOL, HDL, LDLCALC, TRIG, CHOLHDL, LDLDIRECT in the last 72 hours. Thyroid Function Tests: No results for input(s): TSH, T4TOTAL, FREET4, T3FREE, THYROIDAB in the last 72 hours. Anemia Panel: No results for input(s): VITAMINB12, FOLATE, FERRITIN, TIBC, IRON, RETICCTPCT in the last 72 hours. Sepsis Labs: Recent Labs  Lab 07/26/20 0605 07/27/20 0605 07/28/20 0526  PROCALCITON 1.08 0.94 0.54    Recent Results (from the past 240 hour(s))  Urine culture     Status: Abnormal   Collection Time: 07/24/20 10:13 PM   Specimen: Urine, Clean Catch  Result Value Ref Range Status   Specimen Description   Final    URINE, CLEAN CATCH Performed at [facility], [place] [address] [place], [place] [address]    Special Requests   Final    NONE Performed at [facility] [place] [address], [place], [place] [address]    Culture (A)  Final    <10,000 COLONIES/mL INSIGNIFICANT GROWTH Performed at [place] [address] [place], [place] [address]    Report Status 07/26/2020 FINAL  Final  Resp Panel by RT-PCR (Flu A&B, Covid) Nasopharyngeal Swab     Status: None   Collection Time: 07/24/20 10:13 PM   Specimen: Nasopharyngeal Swab; Nasopharyngeal(NP) swabs in vial transport medium  Result Value Ref Range Status   SARS Coronavirus 2 by RT PCR NEGATIVE NEGATIVE Final    Comment: (NOTE) SARS-CoV-2 target nucleic acids are NOT DETECTED.  The SARS-CoV-2 RNA is generally detectable in upper respiratory specimens during the acute phase of infection. The  lowest concentration of SARS-CoV-2 viral copies this assay can detect is 138 copies/mL. A negative result does not preclude SARS-Cov-2 infection and should not be used as the sole basis for treatment or other patient management decisions. A negative result may occur with  improper specimen collection/handling, submission of specimen other than nasopharyngeal  swab, presence of viral mutation(s) within the areas targeted by this assay, and inadequate number of viral copies(<138 copies/mL). A negative result must be combined with clinical observations, patient history, and epidemiological information. The expected result is Negative.  Fact Sheet for Patients:  EntrepreneurPulse.com.au  Fact Sheet for Healthcare Providers:  IncredibleEmployment.be  This test is no t yet approved or cleared by the [place] FDA and  has been authorized for detection and/or diagnosis of SARS-CoV-2 by FDA under an Emergency Use Authorization (EUA). This EUA will remain  in effect (meaning this test can be used) for the duration of the COVID-19 declaration under Section 564(b)(1) of the Act, 21 U.S.C.section 360bbb-3(b)(1), unless the authorization is terminated  or revoked sooner.       Influenza A by PCR NEGATIVE NEGATIVE Final   Influenza B by PCR NEGATIVE NEGATIVE Final    Comment: (NOTE) The Xpert Xpress SARS-CoV-2/FLU/RSV plus assay is intended as an aid in the diagnosis of influenza from Nasopharyngeal swab specimens and should not be used as a sole basis for treatment. Nasal washings and aspirates are unacceptable for Xpert Xpress SARS-CoV-2/FLU/RSV testing.  Fact Sheet for Patients: EntrepreneurPulse.com.au  Fact Sheet for Healthcare Providers: IncredibleEmployment.be  This test is not yet approved or cleared by the [place] FDA and has been authorized for detection and/or diagnosis of SARS-CoV-2 by FDA under  an Emergency Use Authorization (EUA). This EUA will remain in effect (meaning this test can be used) for the duration of the COVID-19 declaration under Section 564(b)(1) of the Act, 21 U.S.C. section 360bbb-3(b)(1), unless the authorization is terminated or revoked.  Performed at [facility], [place] [address], [place], [place] [address]   Culture, blood (routine x 2)     Status: Abnormal   Collection Time: 07/25/20  7:40 AM   Specimen: BLOOD  Result Value Ref Range Status   Specimen Description   Final    BLOOD LEFT ARM Performed at [place] [address] [place], [place] [address]    Special Requests   Final    BOTTLES DRAWN AEROBIC AND ANAEROBIC Blood Culture adequate volume Performed at [place] [address] [place], [place] [address]    Culture  Setup Time   Final    GRAM POSITIVE RODS AEROBIC BOTTLE ONLY CRITICAL RESULT CALLED TO, READ BACK BY AND VERIFIED WITH: Shelda Jakes PHARMD, AT 1057 07/30/20 BY D. VANHOOK    Culture (A)  Final    CORYNEBACTERIUM UREALYTICUM Standardized susceptibility testing for this organism is not available. Performed at [facility], [place] [address] [place], [place] [address]    Report Status 07/31/2020 FINAL  Final  Culture, blood (routine x 2)     Status: None   Collection Time: 07/25/20  7:47 AM   Specimen: BLOOD  Result Value Ref Range Status   Specimen Description   Final    BLOOD RIGHT ARM Performed at [place] [address], [place], [place] [address]    Special Requests   Final    BOTTLES DRAWN AEROBIC AND ANAEROBIC Blood Culture results may not be optimal due to an inadequate volume of blood received in culture bottles Performed at [facility] [place] [address] [place], [place] [address]    Culture   Final    NO GROWTH 5 DAYS Performed at [facility], [place] [address] [place], [place] [address]    Report  Status 07/30/2020 FINAL  Final  MRSA PCR Screening     Status: None   Collection Time: 07/27/20  2:31 PM   Specimen: Nasopharyngeal  Result Value Ref Range Status   MRSA by PCR NEGATIVE NEGATIVE Final    Comment:        The GeneXpert MRSA Assay (FDA approved for NASAL specimens only), is one component of a comprehensive MRSA colonization surveillance program. It is not intended to diagnose MRSA infection nor to guide or monitor treatment for MRSA infections. Performed at [facility], [place] [address], [place],  [address]       Radiology Studies: No results found.    Scheduled Meds:  feeding supplement  237 mL Oral TID BM   folic acid  1 mg Oral Daily   levothyroxine  125 mcg Oral Q0600   melatonin  3 mg Oral QHS   multivitamin  15 mL Oral Q1400   polyethylene glycol  17 g Oral Daily   Continuous Infusions:  ampicillin-sulbactam (UNASYN) IV 3 g (07/31/20 0505)   dextrose 5 % and 0.45 % NaCl with KCl 20 mEq/L 75 mL/hr at 07/31/20 0839   levETIRAcetam 500 mg (07/30/20 2112)   valproate sodium 250 mg (07/31/20 1003)   And   valproate sodium 125 mg (07/30/20 1349)     LOS: 6 days      Time spent: 25 minutes   Dessa Phi, DO [facility] 07/31/2020, 10:34 AM   Available via Epic secure chat 7am-7pm After these hours, please refer to coverage provider listed on [email or web address]

## 2020-07-31 NOTE — Progress Notes (Signed)
Nutrition Follow-up  DOCUMENTATION CODES:   Not applicable  INTERVENTION:  - will monitor for final [place] decision.   NUTRITION DIAGNOSIS:   Inadequate oral intake related to inability to eat,dysphagia as evidenced by NPO status -revised, ongoing  GOAL:   Other (Comment) (will monitor for final GOC decision)  MONITOR:   Other (Comment) (will monitor for final GOC decision)  ASSESSMENT:   86 y.o. 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 Admitted for generalized weakness.  Patient was made NPO 12/14 at 1310 d/t SLP report of moderate severe aspiration.   Palliative Care is following and has been in communication with family. Palliative Care note from yesterday evening states prognosis of <2 weeks and that patient is likely to transition to comfort care. Wife is interested in [facility].   He has not been weighed since 12/8.    Labs reviewed; CBG: 105 mg/dl, Na: 148 mmol/l, K: 3.3 mmol/l, BUN: 28 mg/dl. Medications reviewed.  IVF; D5-1/2 NS-20 mEq IV KCl @ 75 ml/hr (306 kcal).     NUTRITION - FOCUSED PHYSICAL EXAM:  not appropriate at this time.   Diet Order:   Diet Order            Diet NPO time specified  Diet effective now                 EDUCATION NEEDS:   No education needs have been identified at this time  Skin:  Skin Assessment: Skin Integrity Issues: Skin Integrity Issues:: Stage I Stage I: vertebreal column (12/13)  Last BM:  PTA  Height:   Ht Readings from Last 1 Encounters:  07/24/20 5\' 3"  (1.6 m)    Weight:   Wt Readings from Last 1 Encounters:  07/24/20 53.5 kg    Estimated Nutritional Needs:  Kcal:  1600-1800 Protein:  85-100g Fluid:  1.8L/day       Jarome Matin, MS, RD, LDN,  CNSC Inpatient Clinical Dietitian RD pager # available in [place]  After hours/weekend pager # available in [facility]

## 2020-07-31 NOTE — Progress Notes (Signed)
Palliative:  HPI: 86 y.o. 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 Found to have aspiration pneumonia, dehydration, T10 compression fracture. Continues with severe aspiration risk and currently NPO status.   I met today again with wife, [name]. [name] and I discuss goal to move forward with comfort care with hopes of transition to [facility] when bed available. [name] wants to ensure that he is not suffering. She has good understanding of his condition and poor prognosis. She expresses concern with sundowning agitation and we will adjust medication to try and provide him with more relief. We further discussed the details of comfort care and what this looks like.   I also called and spoke with Mr. Ibarra daughter, Jonelle Sidle, with [name]'s permission. Tiffany has good understanding and reports that Dr. Lonny Prude explained everything to her yesterday. She tells me that although they are sad they have noticed his declining status and this is not unexpected to them. She and her sister live out of [place] but have plans for them both to arrive within the next 24 hours to spend time with their father. They understand the plan for transition to comfort and plan for transition to [facility] for end of life care. We did discuss potential for comfort feeds with anticipation that he will not desire more than a taste of anything but can consider this more tomorrow after family have arrived for visit.   Discussed with RN plans for liberalized visitation given end of life status and comfort care. Family wish to ensure that he does not die alone and should be allowed 24 hour coverage. I believe this will bring Mr. Benning comfort to have his family at his bedside.   Exam: Awake, confused. Able to greet me and seems in good spirits but with underlying confusion. No distress. Resting  comfortably in bed. Breathing regular, unlabored. Abd flat.   Plan: - Sundowning/nighttime agitation: Haldol 2 mg IV nightly at 1800. May consider Ativan as needed. Please document response (good, bad, ineffective).  - Comfort care.  - Awaiting transition to [facility].   [place], NP Palliative Medicine Team Pager [phone] (Please see [email or web address] for schedule) Team Phone [phone]    Greater than 50%  of this time was spent counseling and coordinating care related to the above assessment and plan

## 2020-07-31 NOTE — Progress Notes (Signed)
OT Cancellation Note  Patient Details Name: Eric Larsen MRN: [id number] DOB: 1931/02/01   Cancelled Treatment:    Reason Eval/Treat Not Completed: Other (comment). Per RN and chart review family potentially pursuing Hospice. Will await to confirm family's GOC and see treat patient if needed.  Sherill Mangen L Bevan Disney 07/31/2020, 2:18 PM

## 2020-08-01 MED ORDER — SODIUM CHLORIDE 0.9 % IV SOLN
INTRAVENOUS | Status: DC | PRN
  Administered 2020-08-03: 10:00:00 250 mL via INTRAVENOUS

## 2020-08-01 MED ORDER — LIP MEDEX EX OINT
TOPICAL_OINTMENT | CUTANEOUS | Status: DC | PRN
  Filled 2020-08-01: qty 7

## 2020-08-01 NOTE — Care Management Important Message (Signed)
Important Message  Patient Details IM Letter given to the Patient. Name: Eric Larsen MRN: [id number] Date of Birth: 07-14-1931   Medicare Important Message Given:  Yes     Kerin Salen 08/01/2020, 12:00 PM

## 2020-08-01 NOTE — Progress Notes (Addendum)
Manufacturing engineer (ACC)  Requested by [facility] to evaluate pt for residential hospice, family first choice would be [facility].  Advised TOC that there are no bed availability at this point. [place] was offered by [facility] and was declined.   Please call if you have any questions or concerns.  Clementeen Hoof, BSN, [facility] (in [place]) [phone]

## 2020-08-01 NOTE — Progress Notes (Signed)
PROGRESS NOTE    Eric Larsen  FWY:[id number] DOB: February 11, 1931 DOA: 07/24/2020 PCP: [name], MD     Brief Narrative:  Eric Larsen is an 86 y.o. 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 Three days ago, he had gone to get his Covid vaccine but when patient was trying to get out of the car, he had a fall.  Per patient's wife, patient did not lose consciousness and did not hit his head.  Since the Covid vaccination booster, he has been feeling more weak and has been finding it difficult to walk.  Over the last 6 weeks, patient also has been having mid back pain for which patient was started on Lidoderm patch.   In the ER, patient had temperature of 99 F, no neck stiffness, moving all extremities, following commands, alert, awake, generally weak, urine looks discolored but UA does not show any definite infection, Covid test was negative, EKG shows normal sinus rhythm with RBBB.  Since patient was complaining of some abdominal discomfort and CT chest abdomen pelvis was done which shows T1 and and T2 age-indeterminate compression fractures.  CT head was done which showed a small subdural hematoma.  Patient admitted for further management.  Neurosurgery reviewed patient's head CT, thought to be clinically insignificant and wanted no further work-up or treatment.  MRI thoracic spine significant for acute thoracic compression fracture at T10; neurosurgery recommended no TLSO brace secondary to patient's baseline function in addition to significant kyphosis. Due to leukocytosis, patient was started on empiric antibiotics thought to be secondary to bilateral pneumonia, possibly aspiration.  SLP evaluated patient and showed moderate to severe dysphagia.  Recommended for n.p.o.  Palliative care medicine has been involved in establishing goals of  care. Family has decided to transition patient to comfort care and hospice.   New events last 24 hours / Subjective: Daughter at bedside. Patient having a good day per report. Patient without any new issues. Remains comfortable in bed.   Assessment & Plan:   Principal Problem:   Weakness generalized Active Problems:   Primary hypothyroidism   Hypothyroidism   Dementia associated with other underlying disease without behavioral disturbance (HCC)   Subdural hematoma (HCC)   Weakness   Pressure injury of skin   Generalized weakness Bilateral community acquired pneumonia, aspiration pneumonia Subdural hematoma Acute T10 compression fracture Hypothyroidism Seizure disorder Hypernatremia Hypokalemia Blood culture contamination  Comfort care. Residential hospice pending.    Code Status: DNR Family Communication: Daughter at bedside  Disposition Plan:  Status is: Inpatient  Remains inpatient appropriate because:Unsafe d/c plan   Dispo: The patient is from: ALF              Anticipated d/c is to: Residential hospice              Anticipated d/c date is: 1 day              Patient currently is medically stable to d/c. Residential hospice placement pending.    Consultants:   Neurosurgery  Palliative care medicine  Procedures:   None  Antimicrobials:  Anti-infectives (From admission, onward)   Start     Dose/Rate Route Frequency Ordered Stop   07/30/20 1200  Ampicillin-Sulbactam (UNASYN) 3 g in sodium chloride 0.9 % 100 mL IVPB  Status:  Discontinued        3 g 200 mL/hr over 30  Minutes Intravenous Every 6 hours 07/30/20 1150 07/31/20 1624   07/28/20 1800  amoxicillin-clavulanate (AUGMENTIN) 875-125 MG per tablet 1 tablet  Status:  Discontinued        1 tablet Oral Every 12 hours 07/28/20 1153 07/30/20 1150   07/26/20 2200  vancomycin (VANCOREADY) IVPB 750 mg/150 mL  Status:  Discontinued        750 mg 150 mL/hr over 60 Minutes Intravenous Every 24 hours 07/26/20  0800 07/28/20 1133   07/26/20 0900  vancomycin (VANCOCIN) IVPB 1000 mg/200 mL premix        1,000 mg 200 mL/hr over 60 Minutes Intravenous  Once 07/26/20 0800 07/26/20 1007   07/26/20 0830  ceFEPIme (MAXIPIME) 2 g in sodium chloride 0.9 % 100 mL IVPB  Status:  Discontinued        2 g 200 mL/hr over 30 Minutes Intravenous Every 12 hours 07/26/20 0800 07/28/20 1153       Objective: Vitals:   07/30/20 2156 07/31/20 0525 07/31/20 1254 08/01/20 0643  BP: (!) 114/57 124/75 133/72 (!) 110/48  Pulse: (!) 109 (!) 103 75 (!) 57  Resp: 18 15 20 16   Temp: 97.9 F (36.6 C) 98.4 F (36.9 C) 98.6 F (37 C) 97.7 F (36.5 C)  TempSrc: Oral Oral Oral Oral  SpO2: 92% 96% 97% 99%  Weight:      Height:        Intake/Output Summary (Last 24 hours) at 08/01/2020 1143 Last data filed at 08/01/2020 0600 Gross per 24 hour  Intake 1680.52 ml  Output 850 ml  Net 830.52 ml   Filed Weights   07/24/20 2155  Weight: 53.5 kg    Examination: General exam: Appears calm and comfortable   Data Reviewed: I have personally reviewed following labs and imaging studies  CBC: Recent Labs  Lab 07/26/20 0605 07/27/20 0605 07/28/20 0526 07/31/20 0552  WBC 32.3* 12.1* 10.0 14.0*  NEUTROABS 27.2*  --   --   --   HGB 12.4* 10.2* 11.6* 12.4*  HCT 37.2* 32.7* 35.0* 38.9*  MCV 100.5* 106.5* 100.6* 100.5*  PLT 187 170 193 921   Basic Metabolic Panel: Recent Labs  Lab 07/26/20 0605 07/28/20 0526 07/30/20 0510 07/31/20 0552  NA 142  --   --  148*  K 3.9  --   --  3.3*  CL 108  --   --  108  CO2 25  --   --  28  GLUCOSE 149*  --   --  111*  BUN 24*  --   --  28*  CREATININE 0.83 0.58* 0.82 0.84  CALCIUM 8.9  --   --  9.0   GFR: Estimated Creatinine Clearance: 46.9 mL/min (by C-G formula based on SCr of 0.84 mg/dL). Liver Function Tests: Recent Labs  Lab 07/26/20 0605  AST 23  ALT 16  ALKPHOS 53  BILITOT 2.8*  PROT 6.2*  ALBUMIN 3.2*   No results for input(s): LIPASE, AMYLASE in the  last 168 hours. No results for input(s): AMMONIA in the last 168 hours. Coagulation Profile: No results for input(s): INR, PROTIME in the last 168 hours. Cardiac Enzymes: No results for input(s): CKTOTAL, CKMB, CKMBINDEX, TROPONINI in the last 168 hours. BNP (last 3 results) No results for input(s): PROBNP in the last 8760 hours. HbA1C: No results for input(s): HGBA1C in the last 72 hours. CBG: Recent Labs  Lab 07/30/20 0803  GLUCAP 105*   Lipid Profile: No results for input(s): CHOL, HDL, LDLCALC, TRIG,  CHOLHDL, LDLDIRECT in the last 72 hours. Thyroid Function Tests: No results for input(s): TSH, T4TOTAL, FREET4, T3FREE, THYROIDAB in the last 72 hours. Anemia Panel: No results for input(s): VITAMINB12, FOLATE, FERRITIN, TIBC, IRON, RETICCTPCT in the last 72 hours. Sepsis Labs: Recent Labs  Lab 07/26/20 0605 07/27/20 0605 07/28/20 0526  PROCALCITON 1.08 0.94 0.54    Recent Results (from the past 240 hour(s))  Urine culture     Status: Abnormal   Collection Time: 07/24/20 10:13 PM   Specimen: Urine, Clean Catch  Result Value Ref Range Status   Specimen Description   Final    URINE, CLEAN CATCH Performed at [facility], [place] [address] [place], [place] [address]    Special Requests   Final    NONE Performed at [facility], [place] [address] [place], [place] [address]    Culture (A)  Final    <10,000 COLONIES/mL INSIGNIFICANT GROWTH Performed at [facility] [address], [place], [place] [address]    Report Status 07/26/2020 FINAL  Final  Resp Panel by RT-PCR (Flu A&B, Covid) Nasopharyngeal Swab     Status: None   Collection Time: 07/24/20 10:13 PM   Specimen: Nasopharyngeal Swab; Nasopharyngeal(NP) swabs in vial transport medium  Result Value Ref Range Status   SARS Coronavirus 2 by RT PCR NEGATIVE NEGATIVE Final    Comment: (NOTE) SARS-CoV-2 target nucleic acids are NOT DETECTED.  The SARS-CoV-2 RNA is generally  detectable in upper respiratory specimens during the acute phase of infection. The lowest concentration of SARS-CoV-2 viral copies this assay can detect is 138 copies/mL. A negative result does not preclude SARS-Cov-2 infection and should not be used as the sole basis for treatment or other patient management decisions. A negative result may occur with  improper specimen collection/handling, submission of specimen other than nasopharyngeal swab, presence of viral mutation(s) within the areas targeted by this assay, and inadequate number of viral copies(<138 copies/mL). A negative result must be combined with clinical observations, patient history, and epidemiological information. The expected result is Negative.  Fact Sheet for Patients:  EntrepreneurPulse.com.au  Fact Sheet for Healthcare Providers:  IncredibleEmployment.be  This test is no t yet approved or cleared by the [place] FDA and  has been authorized for detection and/or diagnosis of SARS-CoV-2 by FDA under an Emergency Use Authorization (EUA). This EUA will remain  in effect (meaning this test can be used) for the duration of the COVID-19 declaration under Section 564(b)(1) of the Act, 21 U.S.C.section 360bbb-3(b)(1), unless the authorization is terminated  or revoked sooner.       Influenza A by PCR NEGATIVE NEGATIVE Final   Influenza B by PCR NEGATIVE NEGATIVE Final    Comment: (NOTE) The Xpert Xpress SARS-CoV-2/FLU/RSV plus assay is intended as an aid in the diagnosis of influenza from Nasopharyngeal swab specimens and should not be used as a sole basis for treatment. Nasal washings and aspirates are unacceptable for Xpert Xpress SARS-CoV-2/FLU/RSV testing.  Fact Sheet for Patients: EntrepreneurPulse.com.au  Fact Sheet for Healthcare Providers: IncredibleEmployment.be  This test is not yet approved or cleared by the [place] FDA  and has been authorized for detection and/or diagnosis of SARS-CoV-2 by FDA under an Emergency Use Authorization (EUA). This EUA will remain in effect (meaning this test can be used) for the duration of the COVID-19 declaration under Section 564(b)(1) of the Act, 21 U.S.C. section 360bbb-3(b)(1), unless the authorization is terminated or revoked.  Performed at [facility], [place] [address], [place], [place] [address]   Culture, blood (routine x 2)     Status: Abnormal   Collection Time: 07/25/20  7:40 AM   Specimen: BLOOD  Result Value Ref Range Status   Specimen Description   Final    BLOOD LEFT ARM Performed at [facility] [address] [place], [place] [address]    Special Requests   Final    BOTTLES DRAWN AEROBIC AND ANAEROBIC Blood Culture adequate volume Performed at [place] [address], [place], [place] [address]    Culture  Setup Time   Final    GRAM POSITIVE RODS AEROBIC BOTTLE ONLY CRITICAL RESULT CALLED TO, READ BACK BY AND VERIFIED WITH: Shelda Jakes PHARMD, AT 1057 07/30/20 BY D. VANHOOK    Culture (A)  Final    CORYNEBACTERIUM UREALYTICUM Standardized susceptibility testing for this organism is not available. Performed at [facility], [place] [address] [place], [place] [address]    Report Status 07/31/2020 FINAL  Final  Culture, blood (routine x 2)     Status: None   Collection Time: 07/25/20  7:47 AM   Specimen: BLOOD  Result Value Ref Range Status   Specimen Description   Final    BLOOD RIGHT ARM Performed at [facility] [address], [place], [place] [address]    Special Requests   Final    BOTTLES DRAWN AEROBIC AND ANAEROBIC Blood Culture results may not be optimal due to an inadequate volume of blood received in culture bottles Performed at [place] [address] [place], [place] [address]    Culture   Final    NO GROWTH 5  DAYS Performed at [facility], [place] [address] [place], [place] [address]    Report Status 07/30/2020 FINAL  Final  MRSA PCR Screening     Status: None   Collection Time: 07/27/20  2:31 PM   Specimen: Nasopharyngeal  Result Value Ref Range Status   MRSA by PCR NEGATIVE NEGATIVE Final    Comment:        The GeneXpert MRSA Assay (FDA approved for NASAL specimens only), is one component of a comprehensive MRSA colonization surveillance program. It is not intended to diagnose MRSA infection nor to guide or monitor treatment for MRSA infections. Performed at [facility], [place] [address] [place], [place] [address]       Radiology Studies: No results found.    Scheduled Meds: . haloperidol lactate  2 mg Intravenous q1800   Continuous Infusions: . levETIRAcetam Stopped (07/31/20 2155)  . valproate sodium 250 mg (08/01/20 1028)   And  . valproate sodium 125 mg (07/31/20 1408)     LOS: 7 days      Time spent: 25 minutes   Dessa Phi, DO [facility] 08/01/2020, 11:43 AM   Available via Epic secure chat 7am-7pm After these hours, please refer to coverage provider listed on [email or web address]

## 2020-08-01 NOTE — Progress Notes (Signed)
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 Found to have aspiration pneumonia, dehydration, T10 compression fracture.Continues with severe aspiration risk and currently NPO status.  I met today with [name] and then with daughter, [name]. Joined by daughter, Jonelle Sidle, and then [place] returned. Mr. Hedman is having a good day today. He is restless and wanting to get up out of bed. He has enjoyed some comfort feeds. Had a few drops of juice with wet sounding voice but with no signs of discomfort or distress. I did attempt to discuss further with [name] the benefits of comfort feeds.   Mr. Coakley did get up out of bed and sit in chair and even walked some with walker. He was very agitated in chair and kept wanting to get up. He continues with poor intake and high aspiration risk and only taking in a couple drops of liquid mainly for comfort. He was not very interested in having any intake and denied hunger or thirst when I was present.   Plans for transition to hospice facility. [name] seems open to hospice facility in [place] or [place] but [place] is too far. She reports that she plans to speak with Mr. Postiglione children about these options. I did endorse that any of these facilities I feel would take very good care of him.   Exam: Alert, confused. Restless/agitated. No distress. Wet quality to voice with even very small drops of liquid. Breathing regular, unlabored. Abd flat.   Plan: - Comfort medications as needed in place.  - Continue haldol nightly dose with ativan as needed.  - Awaiting hospice facility.   [place], NP Palliative Medicine Team Pager [phone] (Please see [email or web address] for schedule) Team Phone [phone]    Greater than 50%  of this time was spent counseling and coordinating care related to  the above assessment and plan

## 2020-08-01 NOTE — TOC Progression Note (Addendum)
Transition of Care [facility]) - Progression Note    Patient Details  Name: Eric Larsen MRN: [id number] Date of Birth: [birth year]  Transition of Care [facility] CM/SW Contact  Ross Ludwig, [place] Phone Number: 08/01/2020, 10:26 AM  Clinical Narrative:     CSW spoke to patient's wife, she prefers [facility].  CSW told her there still were not any beds available, and asked if she would be interested in the hospice facility in [place], [place], and she said no.  CSW also asked if she would be interested in [place], patient's wife asked for the name and address to be texted to her and she will talk with patient's daughters and think about it.  She gave CSW permission to talk to [place], [place] left a message for admissions at [facility], waiting for response back.  CSW to continue to follow patient's progress throughout discharge planning.  10:48am  CSW received a phone call back from [place] at [place], she will review patient's chart to see if patient is eligible for their facility.  Per [place], there are not currently any beds available at their facility either.  CSW to continue to follow patient's progress throughout discharge planning.  12:00pm CSW received phone call from [place], patient is appropriate for [facility], however they do not have any beds right now.    Barriers to Discharge: Continued Medical Work up  Expected Discharge Plan and Services     Discharge Planning Services: CM Consult                                           Social Determinants of Health (SDOH) Interventions    Readmission Risk Interventions No flowsheet data found.

## 2020-08-02 MED ORDER — LORAZEPAM 2 MG/ML IJ SOLN
1.0000 mg | Freq: Four times a day (QID) | INTRAMUSCULAR | Status: DC | PRN
  Administered 2020-08-03: 04:00:00 1 mg via INTRAVENOUS
  Filled 2020-08-02: qty 1

## 2020-08-02 MED ORDER — HYDROMORPHONE HCL 1 MG/ML IJ SOLN: 1.0000 mg | INTRAMUSCULAR | Status: DC | PRN

## 2020-08-02 NOTE — Progress Notes (Signed)
Manufacturing engineer [facility])  Mr. Dilauro was referred for residential hospice at [facility].  We do not have a bed for him at [facility] and do not anticipate there being one in the foreseeable future.  Family has also declined our facility in [place] also does not have a bed today.  Hopefully, [facility] will have a bed and the family will be agreeable for this d/c plan.  Please let us know if we can assist in any other way.  Venia Carbon RN, BSN, [facility] Liaison

## 2020-08-02 NOTE — TOC Progression Note (Addendum)
Transition of Care [facility]) - Progression Note    Patient Details  Name: Eric Larsen MRN: [id number] Date of Birth: 1930/11/06  Transition of Care [facility]) CM/SW Contact  Ross Ludwig, [place] Phone Number: 08/02/2020, 1:51 PM  Clinical Narrative:     1:12pm  CSW was informed that [facility] still does not have any beds available, and there are people ahead of patient.  CSW received phone call that [facility] has a bed available.  CSW attempted to contact patient's wife to see if she is agreeable to [facility], left a message on her voice mail waiting for a phone call back.  3:00pm  CSW attempted to call patient's wife again, left another message.  3:45pm  CSW received phone call from patient's wife, that she did not answer because she did not recognized the number.  Patient's wife stated that she was not aware that [place] is 30 minutes away from where she lives.  CSW talked to her and explained the directions to get there, and patient's wife stated she knows where it is now.  Patient's wife stated she wanted to talk to patient's daughters to discuss decision.  CSW asked patient's wife to call CSW back before 5pm with an answer so CSW can let weekend [facility] team know and also to update facility if she is agreeable to going.  4:45pm  CSW spoke to patient's wife, she is in agreement to having patient go to [facility].  CSW updated [facility] that patient's wife has accepted the bed offer.  [facility] stated they do not have a bed available anymore, but to follow up over weekend.  CSW will let weekend TOC coverage know to follow up with [facility] on bed availability.  CSW updated palliative and attending physician.  CSW to continue to follow patient's progress throughout discharge planning.     Barriers to Discharge: Continued Medical Work up  Expected Discharge Plan and Services     Discharge  Planning Services: CM Consult                                           Social Determinants of Health (SDOH) Interventions    Readmission Risk Interventions No flowsheet data found.

## 2020-08-02 NOTE — Progress Notes (Addendum)
PROGRESS NOTE    Eric Larsen  VXB:[id number] DOB: 06-Nov-1930 DOA: 07/24/2020 PCP: [name], MD     Brief Narrative:  Eric Larsen is an 86 y.o. 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 Three days ago, he had gone to get his Covid vaccine but when patient was trying to get out of the car, he had a fall.  Per patient's wife, patient did not lose consciousness and did not hit his head.  Since the Covid vaccination booster, he has been feeling more weak and has been finding it difficult to walk.  Over the last 6 weeks, patient also has been having mid back pain for which patient was started on Lidoderm patch.   In the ER, patient had temperature of 99 F, no neck stiffness, moving all extremities, following commands, alert, awake, generally weak, urine looks discolored but UA does not show any definite infection, Covid test was negative, EKG shows normal sinus rhythm with RBBB.  Since patient was complaining of some abdominal discomfort and CT chest abdomen pelvis was done which shows T1 and and T2 age-indeterminate compression fractures.  CT head was done which showed a small subdural hematoma.  Patient admitted for further management.  Neurosurgery reviewed patient's head CT, thought to be clinically insignificant and wanted no further work-up or treatment.  MRI thoracic spine significant for acute thoracic compression fracture at T10; neurosurgery recommended no TLSO brace secondary to patient's baseline function in addition to significant kyphosis. Due to leukocytosis, patient was started on empiric antibiotics thought to be secondary to bilateral pneumonia, possibly aspiration.  SLP evaluated patient and showed moderate to severe dysphagia.  Recommended for n.p.o.  Palliative care medicine has been involved in establishing goals of  care. Family has decided to transition patient to comfort care and hospice.   New events last 24 hours / Subjective: Met with both daughters at bedside today.  Patient had a good day yesterday, was able to walk down the hall.  Still without much appetite, tolerated a few bites of applesauce and some water.  Patient in good spirits, sitting in recliner and appears to be comfortable.  Assessment & Plan:   Principal Problem:   Weakness generalized Active Problems:   Primary hypothyroidism   Hypothyroidism   Dementia associated with other underlying disease without behavioral disturbance (HCC)   Subdural hematoma (HCC)   Weakness   Pressure injury of skin   Generalized weakness Bilateral community acquired pneumonia, aspiration pneumonia Subdural hematoma Acute T10 compression fracture Hypothyroidism Seizure disorder Hypernatremia Hypokalemia Blood culture contamination  Comfort care. Residential hospice pending.    Code Status: DNR Family Communication: Daughters at bedside, also had extended 64 minute phone conversation with wife  Disposition Plan:  Status is: Inpatient  Remains inpatient appropriate because:Unsafe d/c plan   Dispo: The patient is from: ALF              Anticipated d/c is to: Residential hospice              Anticipated d/c date is: 1 day              Patient currently is medically stable to d/c. Residential hospice placement pending.    Consultants:   Neurosurgery  Palliative care medicine  Procedures:   None  Antimicrobials:  Anti-infectives (From admission, onward)   Start     Dose/Rate Route  Frequency Ordered Stop   07/30/20 1200  Ampicillin-Sulbactam (UNASYN) 3 g in sodium chloride 0.9 % 100 mL IVPB  Status:  Discontinued        3 g 200 mL/hr over 30 Minutes Intravenous Every 6 hours 07/30/20 1150 07/31/20 1624   07/28/20 1800  amoxicillin-clavulanate (AUGMENTIN) 875-125 MG per tablet 1 tablet  Status:  Discontinued        1 tablet  Oral Every 12 hours 07/28/20 1153 07/30/20 1150   07/26/20 2200  vancomycin (VANCOREADY) IVPB 750 mg/150 mL  Status:  Discontinued        750 mg 150 mL/hr over 60 Minutes Intravenous Every 24 hours 07/26/20 0800 07/28/20 1133   07/26/20 0900  vancomycin (VANCOCIN) IVPB 1000 mg/200 mL premix        1,000 mg 200 mL/hr over 60 Minutes Intravenous  Once 07/26/20 0800 07/26/20 1007   07/26/20 0830  ceFEPIme (MAXIPIME) 2 g in sodium chloride 0.9 % 100 mL IVPB  Status:  Discontinued        2 g 200 mL/hr over 30 Minutes Intravenous Every 12 hours 07/26/20 0800 07/28/20 1153       Objective: Vitals:   07/31/20 1254 08/01/20 0643 08/01/20 1858 08/02/20 0647  BP: 133/72 (!) 110/48 97/67 122/73  Pulse: 75 (!) 57 64 71  Resp: _0 Temp: 98.6 F (37 C) 97.7 F (36.5 C)  98.2 F (36.8 C)  TempSrc: Oral Oral  Oral  SpO2: 97% 99% 93% (!) 87%  Weight:      Height:        Intake/Output Summary (Last 24 hours) at 08/02/2020 1032 Last data filed at 08/02/2020 6712 Gross per 24 hour  Intake 101.36 ml  Output 775 ml  Net -673.64 ml   Filed Weights   07/24/20 2155  Weight: 53.5 kg    Examination: General exam: Appears calm and comfortable   Data Reviewed: I have personally reviewed following labs and imaging studies  CBC: Recent Labs  Lab 07/27/20 0605 07/28/20 0526 07/31/20 0552  WBC 12.1* 10.0 14.0*  HGB 10.2* 11.6* 12.4*  HCT 32.7* 35.0* 38.9*  MCV 106.5* 100.6* 100.5*  PLT 170 193 458   Basic Metabolic Panel: Recent Labs  Lab 07/28/20 0526 07/30/20 0510 07/31/20 0552  NA  --   --  148*  K  --   --  3.3*  CL  --   --  108  CO2  --   --  28  GLUCOSE  --   --  111*  BUN  --   --  28*  CREATININE 0.58* 0.82 0.84  CALCIUM  --   --  9.0   GFR: Estimated Creatinine Clearance: 46.9 mL/min (by C-G formula based on SCr of 0.84 mg/dL). Liver Function Tests: No results for input(s): AST, ALT, ALKPHOS, BILITOT, PROT, ALBUMIN in the last 168 hours. No results for  input(s): LIPASE, AMYLASE in the last 168 hours. No results for input(s): AMMONIA in the last 168 hours. Coagulation Profile: No results for input(s): INR, PROTIME in the last 168 hours. Cardiac Enzymes: No results for input(s): CKTOTAL, CKMB, CKMBINDEX, TROPONINI in the last 168 hours. BNP (last 3 results) No results for input(s): PROBNP in the last 8760 hours. HbA1C: No results for input(s): HGBA1C in the last 72 hours. CBG: Recent Labs  Lab 07/30/20 0803  GLUCAP 105*   Lipid Profile: No results for input(s): CHOL, HDL, LDLCALC, TRIG, CHOLHDL, LDLDIRECT in the last 72 hours. Thyroid  Function Tests: No results for input(s): TSH, T4TOTAL, FREET4, T3FREE, THYROIDAB in the last 72 hours. Anemia Panel: No results for input(s): VITAMINB12, FOLATE, FERRITIN, TIBC, IRON, RETICCTPCT in the last 72 hours. Sepsis Labs: Recent Labs  Lab 07/27/20 0605 07/28/20 0526  PROCALCITON 0.94 0.54    Recent Results (from the past 240 hour(s))  Urine culture     Status: Abnormal   Collection Time: 07/24/20 10:13 PM   Specimen: Urine, Clean Catch  Result Value Ref Range Status   Specimen Description   Final    URINE, CLEAN CATCH Performed at [facility], [place] [address], [place], [place] [address]    Special Requests   Final    NONE Performed at [facility], [place] [address] [place], [place] [address]    Culture (A)  Final    <10,000 COLONIES/mL INSIGNIFICANT GROWTH Performed at [place] [address], [place], [place] [address]    Report Status 07/26/2020 FINAL  Final  Resp Panel by RT-PCR (Flu A&B, Covid) Nasopharyngeal Swab     Status: None   Collection Time: 07/24/20 10:13 PM   Specimen: Nasopharyngeal Swab; Nasopharyngeal(NP) swabs in vial transport medium  Result Value Ref Range Status   SARS Coronavirus 2 by RT PCR NEGATIVE NEGATIVE Final    Comment: (NOTE) SARS-CoV-2 target nucleic acids are NOT DETECTED.  The SARS-CoV-2 RNA  is generally detectable in upper respiratory specimens during the acute phase of infection. The lowest concentration of SARS-CoV-2 viral copies this assay can detect is 138 copies/mL. A negative result does not preclude SARS-Cov-2 infection and should not be used as the sole basis for treatment or other patient management decisions. A negative result may occur with  improper specimen collection/handling, submission of specimen other than nasopharyngeal swab, presence of viral mutation(s) within the areas targeted by this assay, and inadequate number of viral copies(<138 copies/mL). A negative result must be combined with clinical observations, patient history, and epidemiological information. The expected result is Negative.  Fact Sheet for Patients:  EntrepreneurPulse.com.au  Fact Sheet for Healthcare Providers:  IncredibleEmployment.be  This test is no t yet approved or cleared by the [place] FDA and  has been authorized for detection and/or diagnosis of SARS-CoV-2 by FDA under an Emergency Use Authorization (EUA). This EUA will remain  in effect (meaning this test can be used) for the duration of the COVID-19 declaration under Section 564(b)(1) of the Act, 21 U.S.C.section 360bbb-3(b)(1), unless the authorization is terminated  or revoked sooner.       Influenza A by PCR NEGATIVE NEGATIVE Final   Influenza B by PCR NEGATIVE NEGATIVE Final    Comment: (NOTE) The Xpert Xpress SARS-CoV-2/FLU/RSV plus assay is intended as an aid in the diagnosis of influenza from Nasopharyngeal swab specimens and should not be used as a sole basis for treatment. Nasal washings and aspirates are unacceptable for Xpert Xpress SARS-CoV-2/FLU/RSV testing.  Fact Sheet for Patients: EntrepreneurPulse.com.au  Fact Sheet for Healthcare Providers: IncredibleEmployment.be  This test is not yet approved or cleared by the  [place] FDA and has been authorized for detection and/or diagnosis of SARS-CoV-2 by FDA under an Emergency Use Authorization (EUA). This EUA will remain in effect (meaning this test can be used) for the duration of the COVID-19 declaration under Section 564(b)(1) of the Act, 21 U.S.C. section 360bbb-3(b)(1), unless the authorization is terminated or revoked.  Performed at [facility], [place] [address], [place], [place] [address]   Culture,  blood (routine x 2)     Status: Abnormal   Collection Time: 07/25/20  7:40 AM   Specimen: BLOOD  Result Value Ref Range Status   Specimen Description   Final    BLOOD LEFT ARM Performed at [place] [address], [place], [place] [address]    Special Requests   Final    BOTTLES DRAWN AEROBIC AND ANAEROBIC Blood Culture adequate volume Performed at [place] [address] [place], [place] [address]    Culture  Setup Time   Final    GRAM POSITIVE RODS AEROBIC BOTTLE ONLY CRITICAL RESULT CALLED TO, READ BACK BY AND VERIFIED WITH: Shelda Jakes PHARMD, AT 1057 07/30/20 BY D. VANHOOK    Culture (A)  Final    CORYNEBACTERIUM UREALYTICUM Standardized susceptibility testing for this organism is not available. Performed at [facility], [place] [address], [place], [place] [address]    Report Status 07/31/2020 FINAL  Final  Culture, blood (routine x 2)     Status: None   Collection Time: 07/25/20  7:47 AM   Specimen: BLOOD  Result Value Ref Range Status   Specimen Description   Final    BLOOD RIGHT ARM Performed at [place] [address], [place], [place] [address]    Special Requests   Final    BOTTLES DRAWN AEROBIC AND ANAEROBIC Blood Culture results may not be optimal due to an inadequate volume of blood received in culture bottles Performed at [place] [address] [place], [place] [address]    Culture   Final    NO  GROWTH 5 DAYS Performed at [facility], [place] [address], [place], [place] [address]    Report Status 07/30/2020 FINAL  Final  MRSA PCR Screening     Status: None   Collection Time: 07/27/20  2:31 PM   Specimen: Nasopharyngeal  Result Value Ref Range Status   MRSA by PCR NEGATIVE NEGATIVE Final    Comment:        The GeneXpert MRSA Assay (FDA approved for NASAL specimens only), is one component of a comprehensive MRSA colonization surveillance program. It is not intended to diagnose MRSA infection nor to guide or monitor treatment for MRSA infections. Performed at [facility], [place] [address] [place],  [address]       Radiology Studies: No results found.    Scheduled Meds: . haloperidol lactate  2 mg Intravenous q1800   Continuous Infusions: . sodium chloride    . levETIRAcetam Stopped (08/01/20 2212)  . valproate sodium Stopped (08/01/20 2317)   And  . valproate sodium 125 mg (08/01/20 1506)     LOS: 8 days      Time spent: 35 minutes. Spent prolonged time at bedside discussing care with daughters and Therapist, sports.   Dessa Phi, DO [facility] 08/02/2020, 10:32 AM   Available via Epic secure chat 7am-7pm After these hours, please refer to coverage provider listed on [email or web address]

## 2020-08-03 MED ORDER — LORAZEPAM 1 MG PO TABS: 1.0000 mg | ORAL_TABLET | Freq: Four times a day (QID) | ORAL | Status: DC | PRN

## 2020-08-03 MED ORDER — LORAZEPAM 2 MG/ML IJ SOLN: 1.0000 mg | Freq: Four times a day (QID) | INTRAMUSCULAR | Status: DC | PRN

## 2020-08-03 NOTE — Discharge Summary (Signed)
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 Three days ago, he had gone to get his Covid vaccine but when patient was trying to get out of the car, he had a fall. Per patient's wife, patient did not lose consciousness and did not hit his head. Since the Covid vaccination booster, he has been feeling more weak and has been finding it difficult to walk. Over the last 6 weeks, patient also has been having mid back pain for which patient was started on Lidoderm patch.   In the ER, patient had temperature of 99 F, no neck stiffness, moving all extremities, following commands, alert, awake, generally weak, urine looks discolored but UA does not show any definite infection, Covid test was negative, EKG shows normal sinus rhythm with RBBB. Since patient was complaining of some abdominal discomfort and CT chest abdomen pelvis was done which shows T1 and and T2 age-indeterminate compression fractures. CT head was done which showed a small subdural hematoma. Patient admitted for further management.  Neurosurgery reviewed patient's head CT, thought to be clinically insignificant and wanted no further work-up or treatment.  MRI thoracic spine significant for acute thoracic compression fracture at T10; neurosurgery recommended no TLSO brace secondary to patient's baseline function in addition to significant kyphosis. Due to leukocytosis, patient was started on empiric antibiotics thought to be secondary to bilateral pneumonia, possibly aspiration.  SLP evaluated patient and showed moderate to  severe dysphagia.  Recommended for n.p.o.  Palliative care medicine has been involved in establishing goals of care. Family has decided to transition patient to comfort care and hospice.   Discharge Diagnoses:  Generalized weakness Bilateral community acquired pneumonia, aspiration pneumonia Subdural hematoma Acute T10 compression fracture Hypothyroidism Seizure disorder Hypernatremia Hypokalemia Blood culture contamination   Discharge Instructions   Allergies as of 08/03/2020      Reactions   Pollen Extract Itching, Other (See Comments)   On MAR- Itchy eyes, runny nose, nasal congestion      Medication List    STOP taking these medications   acetaminophen 325 MG tablet Commonly known as: TYLENOL   divalproex 125 MG capsule Commonly known as: DEPAKOTE SPRINKLE   feeding supplement Liqd   folic acid 1 MG tablet Commonly known as: FOLVITE   ibandronate 150 MG tablet Commonly known as: BONIVA   levETIRAcetam 100 MG/ML solution Commonly known as: KEPPRA   levothyroxine 125 MCG tablet Commonly known as: SYNTHROID   lidocaine 5 % Commonly known as: LIDODERM   Life Pack Mens Misc   LORazepam 0.5 MG tablet Commonly known as: ATIVAN   melatonin 3 MG Tabs tablet   nystatin 100000 UNIT/ML suspension Commonly known as: MYCOSTATIN       Allergies  Allergen Reactions  . Pollen Extract Itching and Other (See Comments)    On MAR- Itchy eyes, runny nose, nasal congestion    Procedures/Studies: CT HEAD WO CONTRAST  Result Date: 07/25/2020 CLINICAL DATA:  Fall 07/22/2020 EXAM: CT HEAD WITHOUT CONTRAST TECHNIQUE: Contiguous axial images were obtained from the base of the skull through the vertex without intravenous contrast. COMPARISON:  03/30/2020 FINDINGS: Brain: Significant brain atrophy, especially at the temporal lobes where there  is asymmetric gliosis on the left. The gliosis is medial and anterior and could be posttraumatic or post ischemic. Trace (3 mm in  thickness) right parafalcine thickening of the posterior more than anterior falx, only convincing for hematoma given the comparison. No infarct, obstructive hydrocephalus, or masslike finding. Vascular: Atheromatous calcification Skull: No acute or aggressive finding. Trabecular coarsening over the greater wing sphenoid and squamosal left temporal bone which could be from fibrous dysplasia or Paget's disease. Sinuses/Orbits: Bilateral cataract resection Critical Value/emergent results were called by telephone at the time of interpretation on 07/25/2020 at 5:39 am to provider [facility] , who verbally acknowledged these results. IMPRESSION: 1. Trace falcine subdural hematoma, only convincing given a recent comparison. 2. Brain atrophy especially prominent the temporal lobes. 3. Remote infarct or contusion at the left temporal lobe. Electronically Signed   By: Monte Fantasia M.D.   On: 07/25/2020 06:02   MR THORACIC SPINE WO CONTRAST  Result Date: 07/25/2020 CLINICAL DATA:  Severe mid back pain. EXAM: MRI THORACIC SPINE WITHOUT CONTRAST TECHNIQUE: Multiplanar, multisequence MR imaging of the thoracic spine was performed. No intravenous contrast was administered. COMPARISON:  CT a chest from same day. FINDINGS: Alignment:  Exaggerated thoracic kyphosis.  No listhesis. Vertebrae: Acute T10 anterior inferior endplate compression fracture with approximately 20% height loss. No retropulsion. Chronic T1, T2, T3, T7, T8, T9 and, and T11 compression fractures. No evidence of discitis or suspicious bone lesion. Cord:  Normal signal and morphology. Paraspinal and other soft tissues: Cardiomegaly. Aortic atherosclerosis. Trace bilateral pleural effusions. Disc levels: Scattered small central and paracentral disc protrusions. No spinal canal or neuroforaminal stenosis. IMPRESSION: 1. Acute mild T10 anterior inferior endplate compression fracture. No retropulsion. 2. Multiple additional chronic thoracic compression  fractures. Electronically Signed   By: Titus Dubin M.D.   On: 07/25/2020 08:59   DG CHEST PORT 1 VIEW  Result Date: 07/26/2020 CLINICAL DATA:  Cough.  Leukocytosis. EXAM: PORTABLE CHEST 1 VIEW COMPARISON:  CT 07/25/2020.  Chest x-ray 07/24/2020. FINDINGS: Mediastinum hilar structures normal. Low lung volumes with bibasilar infiltrates suggesting bibasilar pneumonia. No pleural effusion or pneumothorax. Degenerative changes scoliosis thoracic spine. Surgical clips right upper quadrant. IMPRESSION: Low lung volumes with bibasilar infiltrates suggesting bibasilar pneumonia. Electronically Signed   By: Marcello Moores  Register   On: 07/26/2020 16:17   DG Chest Portable 1 View  Result Date: 07/24/2020 CLINICAL DATA:  86 year old male with cough and confusion. Weakness, fever starting today. EXAM: PORTABLE CHEST 1 VIEW COMPARISON:  Chest radiographs 05/24/2020 and earlier. FINDINGS: Portable AP semi upright view at 2334 hours. Stable lung volumes and mediastinal contours. Allowing for portable technique the lungs are clear. No pneumothorax. Visible bowel gas pattern is within normal limits. Stable visualized osseous structures, including previous left lateral rib fractures, left humerus ORIF. Stable cholecystectomy clips. IMPRESSION: No acute cardiopulmonary abnormality. Electronically Signed   By: Genevie Ann M.D.   On: 07/24/2020 23:51   DG Swallowing Func-Speech Pathology  Result Date: 07/29/2020 Objective Swallowing Evaluation: Type of Study: MBS-Modified Barium Swallow Study  Patient Details Name: Orel Cooler MRN: [id number] Date of Birth: 11-28-1930 Today's Date: 07/29/2020 Time: SLP Start Time (ACUTE ONLY): 0820 -SLP Stop Time (ACUTE ONLY): 0845 SLP Time Calculation (min) (ACUTE ONLY): 25 min Past Medical History: Past Medical History: Diagnosis Date . Cognitive decline  . Hypothyroidism  . Osteoporosis  . Thyroid disease  Past Surgical History: Past Surgical History: Procedure Laterality Date .  CHOLECYSTECTOMY   . HIP FRACTURE SURGERY   . INTRAMEDULLARY (IM)  NAIL INTERTROCHANTERIC Right 08/13/2017  Procedure: INTRAMEDULLARY (IM) NAIL INTERTROCHANTRIC;  Surgeon: Earnestine Leys, MD;  Location: [facility] ORS;  Service: Orthopedics;  Laterality: Right; . SHOULDER SURGERY   HPI: 86 y.o. 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 Patient fell while going to get COVID booster on 12/6 and since has had increased weakness. Patient also with ~6 week hx of increased back pain with MR of spine showing mild T10 anterior inferior endplate compression fracture. CXR shows PNA. Patient has an MBS in 11/2019 during CIR admission which recommended dysphagia 2 with nectar thick liquids, possible water protocol. Per wife, pateint advanced by SLP sometime in mid april after d/c.  Pt with decrease ambulation ability and is high fall risk.  He has been using rollator. Per review of chart, pt with presipitous decline over the 4 months per palliative care note in pt's chart from visit 03/28/2020.  Subjective: pt awake in chair Assessment / Plan / Recommendation CHL IP CLINICAL IMPRESSIONS 07/29/2020  Patient presents with moderate oral and severe pharyngeal dysphagia with sensorimotor deficits likely due to his dementia.  Grossly weak swallow results in gross retention in pharynx and penetration/aspiration of thin, nectar and honey consistencies without sensation.   Pt is in naturally chin tuck postion due to his kyphosis and did not elevate his head for improved positioning despite verbal/visual cues.  Oral transiting is delayed and weak resulting in premature spillage into pharynx. Silent laryngeal penetration and aspiration during and after the swallow noted with thin, nectar, honey mixed with retained secretions without clearance.  Portion of all boluses noted to be bubbling in and out of open airway while pt was breathing.   Although  puree was not penetrated, retention is gross across all boluses and aspiration of puree may be more caustic due to viscocity. Pt did not follow directions to cough/clear his throat or swallow due to his dementia and this severely limits options for compensations. He does participate and is echolalic, mimicked a throat clear only which was ineffective.  Pharyngeal clearance of thin better than all other consistencies.  Recommend palliative consult to establish GOC due to pt's gross dysphagia and inability to clear.  Pt is aspirating secretions chronically.  Feasible option given pt's advanced age, dementia, secretion aspiration and acute on chronic dysphagia may be to allow po with accepted aspiration risks for comfort.  Will follow up to help address dysphagia education.  Pending definitive decision, recommend pt be able to consume thin water after oral care for oral hygieine and comfort.  Swallow Evaluation Recommendations    SLP Diet Recommendations: Other (Comment);NPO (? comfort diet with accepted aspiration risks) pending decisions, recommend thin water after oral care for oral hygiene and comfort    Medication Administration: Via alternative means  SLP Visit Diagnosis Dysphagia, oropharyngeal phase (R13.12) Attention and concentration deficit following -- Frontal lobe and executive function deficit following -- Impact on safety and function Severe aspiration risk;Risk for inadequate nutrition/hydration   CHL IP TREATMENT RECOMMENDATION 07/29/2020 Treatment Recommendations Therapy as outlined in treatment plan below   Prognosis 10/15/2019 Prognosis for Safe Diet Advancement Fair Barriers to Reach Goals Cognitive deficits;Language deficits Barriers/Prognosis Comment -- CHL IP DIET RECOMMENDATION 07/29/2020 SLP Diet Recommendations Other (Comment);NPO Liquid Administration via -- Medication Administration Via alternative means Compensations -- Postural Changes --             CHL IP FOLLOW UP RECOMMENDATIONS  07/29/2020 Follow up  Recommendations None   CHL IP FREQUENCY AND DURATION 07/29/2020 Speech Therapy Frequency (ACUTE ONLY) min 1 x/week Treatment Duration 1 week      CHL IP ORAL PHASE 07/29/2020 Oral Phase Impaired Oral - Pudding Teaspoon -- Oral - Pudding Cup -- Oral - Honey Teaspoon Weak lingual manipulation;Delayed oral transit;Premature spillage;Decreased bolus cohesion;Lingual pumping;Reduced posterior propulsion Oral - Honey Cup -- Oral - Nectar Teaspoon Reduced posterior propulsion;Weak lingual manipulation;Lingual/palatal residue;Premature spillage;Decreased bolus cohesion;Delayed oral transit Oral - Nectar Cup Premature spillage;Weak lingual manipulation;Reduced posterior propulsion;Decreased bolus cohesion;Lingual pumping;Delayed oral transit Oral - Nectar Straw Lingual pumping;Reduced posterior propulsion;Delayed oral transit;Decreased bolus cohesion;Premature spillage;Weak lingual manipulation Oral - Thin Teaspoon Weak lingual manipulation;Delayed oral transit;Premature spillage;Decreased bolus cohesion;Reduced posterior propulsion;Lingual pumping Oral - Thin Cup Delayed oral transit;Decreased bolus cohesion;Premature spillage;Reduced posterior propulsion;Weak lingual manipulation;Lingual pumping Oral - Thin Straw Decreased bolus cohesion;Delayed oral transit;Reduced posterior propulsion;Premature spillage;Weak lingual manipulation;Lingual pumping Oral - Puree Weak lingual manipulation;Delayed oral transit;Reduced posterior propulsion;Lingual pumping;Decreased bolus cohesion;Premature spillage Oral - Mech Soft NT Oral - Regular NT Oral - Multi-Consistency NT Oral - Pill NT Oral Phase - Comment use of spoon pressure to tongue did not triggers swallow, use of straw improved oral transit as it placed boluses posterior in oral cavity to allow more efficient spillage into pharynx  CHL IP PHARYNGEAL PHASE 07/29/2020 Pharyngeal Phase Impaired Pharyngeal- Pudding Teaspoon -- Pharyngeal -- Pharyngeal- Pudding  Cup -- Pharyngeal -- Pharyngeal- Honey Teaspoon -- Pharyngeal -- Pharyngeal- Honey Cup -- Pharyngeal -- Pharyngeal- Nectar Teaspoon Delayed swallow initiation-pyriform sinuses;Reduced laryngeal elevation;Pharyngeal residue - pyriform;Moderate aspiration;Penetration/Aspiration during swallow;Penetration/Apiration after swallow Pharyngeal Material enters airway, passes BELOW cords without attempt by patient to eject out (silent aspiration) Pharyngeal- Nectar Cup Delayed swallow initiation-pyriform sinuses;Reduced airway/laryngeal closure;Moderate aspiration;Pharyngeal residue - pyriform;Penetration/Aspiration during swallow;Penetration/Apiration after swallow Pharyngeal Material enters airway, passes BELOW cords without attempt by patient to eject out (silent aspiration) Pharyngeal- Nectar Straw Delayed swallow initiation-pyriform sinuses;Pharyngeal residue - pyriform Pharyngeal Material enters airway, passes BELOW cords without attempt by patient to eject out (silent aspiration) Pharyngeal- Thin Teaspoon Delayed swallow initiation-pyriform sinuses;Reduced laryngeal elevation;Pharyngeal residue - pyriform;Moderate aspiration;Penetration/Aspiration during swallow;Penetration/Apiration after swallow Pharyngeal Material enters airway, passes BELOW cords without attempt by patient to eject out (silent aspiration) Pharyngeal- Thin Cup Reduced laryngeal elevation;Moderate aspiration;Pharyngeal residue - pyriform;Penetration/Aspiration during swallow;Penetration/Apiration after swallow;Delayed swallow initiation-vallecula Pharyngeal Material enters airway, passes BELOW cords without attempt by patient to eject out (silent aspiration) Pharyngeal- Thin Straw Reduced laryngeal elevation;Pharyngeal residue - pyriform;Moderate aspiration;Penetration/Aspiration during swallow;Penetration/Apiration after swallow;Delayed swallow initiation-vallecula Pharyngeal Material enters airway, passes BELOW cords without attempt by patient to  eject out (silent aspiration) Pharyngeal- Puree Delayed swallow initiation-vallecula;Pharyngeal residue - valleculae;Pharyngeal residue - pyriform Pharyngeal -- Pharyngeal- Mechanical Soft -- Pharyngeal -- Pharyngeal- Regular -- Pharyngeal -- Pharyngeal- Multi-consistency -- Pharyngeal -- Pharyngeal- Pill -- Pharyngeal -- Pharyngeal Comment Pt noted to have secretions retained in oropharynx, Secretions mixed with barium and were aspirated - bubbling in and out of open airway with pt breathing.  Pt did not follow directions to cough/clear his throat or swallow due to his dementia. He does participate and is echolalic, mimicked a throat clear only which was ineffective.  Pharyngeal clearance of thin better than all other consistencies.  CHL IP CERVICAL ESOPHAGEAL PHASE 07/29/2020 Cervical Esophageal Phase Impaired Pudding Teaspoon -- Pudding Cup -- Honey Teaspoon -- Honey Cup -- Nectar Teaspoon -- Nectar Cup -- Nectar Straw -- Thin Teaspoon -- Thin Cup -- Thin Straw -- Puree -- Mechanical Soft -- Regular -- Multi-consistency -- Pill -- Cervical  Esophageal Comment -- Kathleen Lime, MS [facility] SLP Acute Rehab Services Office [phone] Pager [phone] Macario Golds 07/29/2020, 9:36 AM              CT Angio Chest/Abd/Pel for Dissection W and/or Wo Contrast  Result Date: 07/25/2020 CLINICAL DATA:  Abdominal pain. Concern for aortic dissection. Weakness for 2 days. EXAM: CT ANGIOGRAPHY CHEST, ABDOMEN AND PELVIS TECHNIQUE: Non-contrast CT of the chest was initially obtained. Multidetector CT imaging through the chest, abdomen and pelvis was performed using the standard protocol during bolus administration of intravenous contrast. Multiplanar reconstructed images and MIPs were obtained and reviewed to evaluate the vascular anatomy. CONTRAST:  54mL OMNIPAQUE IOHEXOL 350 MG/ML SOLN COMPARISON:  CT chest dated 08/16/2017. FINDINGS: CTA CHEST FINDINGS Cardiovascular: The heart size is mildly enlarged. There are coronary  artery calcifications. Moderate atherosclerotic changes are noted of the thoracic aorta without evidence for dissection or aneurysm. The arch vessels are grossly patent where visualized. There is no large centrally located pulmonary embolism. Mediastinum/Nodes: -- No mediastinal lymphadenopathy. -- No hilar lymphadenopathy. -- No axillary lymphadenopathy. -- No supraclavicular lymphadenopathy. -- Normal thyroid gland where visualized. -  Unremarkable esophagus. Lungs/Pleura: Mild emphysematous changes are noted bilaterally. There is atelectasis at the lung bases, right greater than left. There is no pneumothorax or large pleural effusion. The trachea is unremarkable. Musculoskeletal: There is age-indeterminate height loss of the T1 and T2 vertebral bodies, new since prior study in 2018. Additional chronic appearing height loss is noted of multiple vertebral bodies throughout the thoracic spine. There is an old healed fracture of the sternum. Review of the MIP images confirms the above findings. CTA ABDOMEN AND PELVIS FINDINGS VASCULAR Aorta: There are atherosclerotic changes throughout the abdominal aorta without evidence for an aneurysm or dissection. 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 SMA: There is moderate narrowing of the proximal SMA. Renals: There is moderate narrowing of both renal arteries. IMA: Patent without evidence of aneurysm, dissection, vasculitis or significant stenosis. Inflow: Patent without evidence of aneurysm, dissection, vasculitis or significant stenosis. Veins: No obvious venous abnormality within the limitations of this arterial phase study. Review of the MIP images confirms the above findings. NON-VASCULAR Hepatobiliary: The liver is normal. Status post cholecystectomy.There is mild extrahepatic and intrahepatic biliary ductal dilatation. Pancreas: Normal contours without ductal dilatation. No peripancreatic  fluid collection. Spleen: Spleen is borderline enlarged measuring 12 cm craniocaudad. Adrenals/Urinary Tract: --Adrenal glands: Unremarkable. --Right kidney/ureter: No hydronephrosis or radiopaque kidney stones. --Left kidney/ureter: There is an indeterminate exophytic lesion involving the posterior interpolar region of the left kidney measuring approximately 1.8 cm and 42 Hounsfield units. --Urinary bladder: The urinary bladder cannot be well evaluated secondary to extensive streak artifact. Stomach/Bowel: --Stomach/Duodenum: No hiatal hernia or other gastric abnormality. Normal duodenal course and caliber. --Small bowel: Unremarkable. --Colon: There is a large amount of stool throughout the colon, especially in the rectum and cecum. --Appendix: Not visualized. No right lower quadrant inflammation or free fluid. Lymphatic: Normal course and caliber of the major abdominal vessels. --No retroperitoneal lymphadenopathy. --No mesenteric lymphadenopathy. --No pelvic or inguinal lymphadenopathy. Reproductive: The prostate gland is enlarged. Other: No ascites or free air. The abdominal wall is normal. Musculoskeletal. There is chronic appearing height loss of multiple lumbar vertebral bodies including the L2, L3, and L4 vertebral bodies. There is no definite acute compression fracture identified on today's study. The patient is status post prior total hip arthroplasty on the left and intramedullary nailing of  the right femur. There is a sclerotic lesion in the right iliac bone. Review of the MIP images confirms the above findings. IMPRESSION: 1. No evidence for aortic dissection. No evidence for large centrally located pulmonary embolism. 2. Age-indeterminate height loss of the T1 and T2 vertebral bodies, new since prior study in 2018. Additional chronic appearing height loss is noted of multiple vertebral bodies throughout the thoracic and lumbar spine. Diffuse osteopenia is noted. 3. There is a large amount of stool  throughout the colon, especially in the rectum and cecum. Correlate for constipation. 4. Indeterminate exophytic lesion involving the posterior interpolar region of the left kidney measuring approximately 1.8 cm and 42 Hounsfield units. While this may represent a hemorrhagic or proteinaceous cyst, a solid mass is not excluded. Follow-up with a nonemergent outpatient renal ultrasound is recommended for further evaluation. 5. Cardiomegaly and coronary artery calcifications. 6. Mild intrahepatic and extrahepatic biliary ductal dilatation. Correlation with laboratory studies is recommended. Aortic Atherosclerosis (ICD10-I70.0) and Emphysema (ICD10-J43.9). Electronically Signed   By: Constance Holster M.D.   On: 07/25/2020 03:01       Discharge Exam: Vitals:   08/02/20 0647 08/02/20 2058  BP: 122/73 109/62  Pulse: 71 85  Resp: 18   Temp: 98.2 F (36.8 C) 98.2 F (36.8 C)  SpO2: (!) 87% 98%    General: Pt comfortable appearing, not in acute distress    The results of significant diagnostics from this hospitalization (including imaging, microbiology, ancillary and laboratory) are listed below for reference.     Microbiology: Recent Results (from the past 240 hour(s))  Urine culture     Status: Abnormal   Collection Time: 07/24/20 10:13 PM   Specimen: Urine, Clean Catch  Result Value Ref Range Status   Specimen Description   Final    URINE, CLEAN CATCH Performed at [facility], [place] [address] [place], [place] [address]    Special Requests   Final    NONE Performed at [facility], [place] [address], [place], [place] [address]    Culture (A)  Final    <10,000 COLONIES/mL INSIGNIFICANT GROWTH Performed at [place] [address], [place], [place] [address]    Report Status 07/26/2020 FINAL  Final  Resp Panel by RT-PCR (Flu A&B, Covid) Nasopharyngeal Swab     Status: None   Collection Time: 07/24/20 10:13 PM   Specimen: Nasopharyngeal  Swab; Nasopharyngeal(NP) swabs in vial transport medium  Result Value Ref Range Status   SARS Coronavirus 2 by RT PCR NEGATIVE NEGATIVE Final    Comment: (NOTE) SARS-CoV-2 target nucleic acids are NOT DETECTED.  The SARS-CoV-2 RNA is generally detectable in upper respiratory specimens during the acute phase of infection. The lowest concentration of SARS-CoV-2 viral copies this assay can detect is 138 copies/mL. A negative result does not preclude SARS-Cov-2 infection and should not be used as the sole basis for treatment or other patient management decisions. A negative result may occur with  improper specimen collection/handling, submission of specimen other than nasopharyngeal swab, presence of viral mutation(s) within the areas targeted by this assay, and inadequate number of viral copies(<138 copies/mL). A negative result must be combined with clinical observations, patient history, and epidemiological information. The expected result is Negative.  Fact Sheet for Patients:  EntrepreneurPulse.com.au  Fact Sheet for Healthcare Providers:  IncredibleEmployment.be  This test is no t yet approved or cleared by the [place] FDA and  has been authorized for detection and/or diagnosis of SARS-CoV-2  by FDA under an Emergency Use Authorization (EUA). This EUA will remain  in effect (meaning this test can be used) for the duration of the COVID-19 declaration under Section 564(b)(1) of the Act, 21 U.S.C.section 360bbb-3(b)(1), unless the authorization is terminated  or revoked sooner.       Influenza A by PCR NEGATIVE NEGATIVE Final   Influenza B by PCR NEGATIVE NEGATIVE Final    Comment: (NOTE) The Xpert Xpress SARS-CoV-2/FLU/RSV plus assay is intended as an aid in the diagnosis of influenza from Nasopharyngeal swab specimens and should not be used as a sole basis for treatment. Nasal washings and aspirates are unacceptable for Xpert Xpress  SARS-CoV-2/FLU/RSV testing.  Fact Sheet for Patients: EntrepreneurPulse.com.au  Fact Sheet for Healthcare Providers: IncredibleEmployment.be  This test is not yet approved or cleared by the [place] FDA and has been authorized for detection and/or diagnosis of SARS-CoV-2 by FDA under an Emergency Use Authorization (EUA). This EUA will remain in effect (meaning this test can be used) for the duration of the COVID-19 declaration under Section 564(b)(1) of the Act, 21 U.S.C. section 360bbb-3(b)(1), unless the authorization is terminated or revoked.  Performed at [facility] : [facility], [place] [address] [place], [place] [address]   Culture, blood (routine x 2)     Status: Abnormal   Collection Time: 07/25/20  7:40 AM   Specimen: BLOOD  Result Value Ref Range Status   Specimen Description   Final    BLOOD LEFT ARM Performed at [place] [address] [place], [place] [address]    Special Requests   Final    BOTTLES DRAWN AEROBIC AND ANAEROBIC Blood Culture adequate volume Performed at [facility] [address], [place], [place] [address]    Culture  Setup Time   Final    GRAM POSITIVE RODS AEROBIC BOTTLE ONLY CRITICAL RESULT CALLED TO, READ BACK BY AND VERIFIED WITH: Shelda Jakes PHARMD, AT 1057 07/30/20 BY D. VANHOOK    Culture (A)  Final    CORYNEBACTERIUM UREALYTICUM Standardized susceptibility testing for this organism is not available. Performed at [facility], [place] [address], [place], [place] [address]    Report Status 07/31/2020 FINAL  Final  Culture, blood (routine x 2)     Status: None   Collection Time: 07/25/20  7:47 AM   Specimen: BLOOD  Result Value Ref Range Status   Specimen Description   Final    BLOOD RIGHT ARM Performed at [facility] [address], [place], [place] [address]    Special Requests   Final    BOTTLES DRAWN AEROBIC AND  ANAEROBIC Blood Culture results may not be optimal due to an inadequate volume of blood received in culture bottles Performed at [facility] [address] [place], [place] [address]    Culture   Final    NO GROWTH 5 DAYS Performed at [facility], [place] [address] [place], [place] [address]    Report Status 07/30/2020 FINAL  Final  MRSA PCR Screening     Status: None   Collection Time: 07/27/20  2:31 PM   Specimen: Nasopharyngeal  Result Value Ref Range Status   MRSA by PCR NEGATIVE NEGATIVE Final    Comment:        The GeneXpert MRSA Assay (FDA approved for NASAL specimens only), is one component of a comprehensive MRSA colonization surveillance program. It is not intended to diagnose MRSA infection nor to guide  or monitor treatment for MRSA infections. Performed at [facility], [place] [address], [place], [place] [address]      Labs: BNP (last 3 results) No results for input(s): BNP in the last 8760 hours. Basic Metabolic Panel: Recent Labs  Lab 07/28/20 0526 07/30/20 0510 07/31/20 0552  NA  --   --  148*  K  --   --  3.3*  CL  --   --  108  CO2  --   --  28  GLUCOSE  --   --  111*  BUN  --   --  28*  CREATININE 0.58* 0.82 0.84  CALCIUM  --   --  9.0   Liver Function Tests: No results for input(s): AST, ALT, ALKPHOS, BILITOT, PROT, ALBUMIN in the last 168 hours. No results for input(s): LIPASE, AMYLASE in the last 168 hours. No results for input(s): AMMONIA in the last 168 hours. CBC: Recent Labs  Lab 07/28/20 0526 07/31/20 0552  WBC 10.0 14.0*  HGB 11.6* 12.4*  HCT 35.0* 38.9*  MCV 100.6* 100.5*  PLT 193 298   Cardiac Enzymes: No results for input(s): CKTOTAL, CKMB, CKMBINDEX, TROPONINI in the last 168 hours. BNP: Invalid input(s): POCBNP CBG: Recent Labs  Lab 07/30/20 0803  GLUCAP 105*   D-Dimer No results for input(s): DDIMER in the last 72 hours. Hgb A1c No results for input(s): HGBA1C in the  last 72 hours. Lipid Profile No results for input(s): CHOL, HDL, LDLCALC, TRIG, CHOLHDL, LDLDIRECT in the last 72 hours. Thyroid function studies No results for input(s): TSH, T4TOTAL, T3FREE, THYROIDAB in the last 72 hours.  Invalid input(s): FREET3 Anemia work up No results for input(s): VITAMINB12, FOLATE, FERRITIN, TIBC, IRON, RETICCTPCT in the last 72 hours. Urinalysis    Component Value Date/Time   COLORURINE YELLOW 07/24/2020 2212   APPEARANCEUR CLEAR 07/24/2020 2212   LABSPEC 1.020 07/24/2020 2212   PHURINE 6.0 07/24/2020 2212   GLUCOSEU NEGATIVE 07/24/2020 2212   HGBUR NEGATIVE 07/24/2020 2212   BILIRUBINUR NEGATIVE 07/24/2020 2212   KETONESUR 20 (A) 07/24/2020 2212   PROTEINUR NEGATIVE 07/24/2020 2212   NITRITE NEGATIVE 07/24/2020 2212   LEUKOCYTESUR NEGATIVE 07/24/2020 2212   Sepsis Labs Invalid input(s): PROCALCITONIN,  WBC,  LACTICIDVEN Microbiology Recent Results (from the past 240 hour(s))  Urine culture     Status: Abnormal   Collection Time: 07/24/20 10:13 PM   Specimen: Urine, Clean Catch  Result Value Ref Range Status   Specimen Description   Final    URINE, CLEAN CATCH Performed at [facility], [place] [address] [place], [place] [address]    Special Requests   Final    NONE Performed at [facility], [place] [address] [place], [place] [address]    Culture (A)  Final    <10,000 COLONIES/mL INSIGNIFICANT GROWTH Performed at [facility] [address] [place], [place] [address]    Report Status 07/26/2020 FINAL  Final  Resp Panel by RT-PCR (Flu A&B, Covid) Nasopharyngeal Swab     Status: None   Collection Time: 07/24/20 10:13 PM   Specimen: Nasopharyngeal Swab; Nasopharyngeal(NP) swabs in vial transport medium  Result Value Ref Range Status   SARS Coronavirus 2 by RT PCR NEGATIVE NEGATIVE Final    Comment: (NOTE) SARS-CoV-2 target nucleic acids are NOT DETECTED.  The SARS-CoV-2 RNA is generally  detectable in upper respiratory specimens during the acute phase of infection. The lowest concentration of SARS-CoV-2 viral copies this assay  can detect is 138 copies/mL. A negative result does not preclude SARS-Cov-2 infection and should not be used as the sole basis for treatment or other patient management decisions. A negative result may occur with  improper specimen collection/handling, submission of specimen other than nasopharyngeal swab, presence of viral mutation(s) within the areas targeted by this assay, and inadequate number of viral copies(<138 copies/mL). A negative result must be combined with clinical observations, patient history, and epidemiological information. The expected result is Negative.  Fact Sheet for Patients:  EntrepreneurPulse.com.au  Fact Sheet for Healthcare Providers:  IncredibleEmployment.be  This test is no t yet approved or cleared by the [place] FDA and  has been authorized for detection and/or diagnosis of SARS-CoV-2 by FDA under an Emergency Use Authorization (EUA). This EUA will remain  in effect (meaning this test can be used) for the duration of the COVID-19 declaration under Section 564(b)(1) of the Act, 21 U.S.C.section 360bbb-3(b)(1), unless the authorization is terminated  or revoked sooner.       Influenza A by PCR NEGATIVE NEGATIVE Final   Influenza B by PCR NEGATIVE NEGATIVE Final    Comment: (NOTE) The Xpert Xpress SARS-CoV-2/FLU/RSV plus assay is intended as an aid in the diagnosis of influenza from Nasopharyngeal swab specimens and should not be used as a sole basis for treatment. Nasal washings and aspirates are unacceptable for Xpert Xpress SARS-CoV-2/FLU/RSV testing.  Fact Sheet for Patients: EntrepreneurPulse.com.au  Fact Sheet for Healthcare Providers: IncredibleEmployment.be  This test is not yet approved or cleared by the [place] FDA  and has been authorized for detection and/or diagnosis of SARS-CoV-2 by FDA under an Emergency Use Authorization (EUA). This EUA will remain in effect (meaning this test can be used) for the duration of the COVID-19 declaration under Section 564(b)(1) of the Act, 21 U.S.C. section 360bbb-3(b)(1), unless the authorization is terminated or revoked.  Performed at [facility], [place] [address] [place], [place] [address]   Culture, blood (routine x 2)     Status: Abnormal   Collection Time: 07/25/20  7:40 AM   Specimen: BLOOD  Result Value Ref Range Status   Specimen Description   Final    BLOOD LEFT ARM Performed at [facility] [address], [place], [place] [address]    Special Requests   Final    BOTTLES DRAWN AEROBIC AND ANAEROBIC Blood Culture adequate volume Performed at [facility] [address] [place], [place] [address]    Culture  Setup Time   Final    GRAM POSITIVE RODS AEROBIC BOTTLE ONLY CRITICAL RESULT CALLED TO, READ BACK BY AND VERIFIED WITH: Shelda Jakes PHARMD, AT 1057 07/30/20 BY D. VANHOOK    Culture (A)  Final    CORYNEBACTERIUM UREALYTICUM Standardized susceptibility testing for this organism is not available. Performed at [facility], [place] [address], [place], [place] [address]    Report Status 07/31/2020 FINAL  Final  Culture, blood (routine x 2)     Status: None   Collection Time: 07/25/20  7:47 AM   Specimen: BLOOD  Result Value Ref Range Status   Specimen Description   Final    BLOOD RIGHT ARM Performed at [facility] [address] [place], [place] [address]    Special Requests   Final    BOTTLES DRAWN AEROBIC AND ANAEROBIC Blood Culture results may not be optimal due to an inadequate volume of blood received in culture bottles Performed at [facility], [place] [address], [place], [place] [address]    Culture   Final    NO GROWTH 5  DAYS Performed at [facility], [place] [address], [place], [place] [address]    Report Status 07/30/2020 FINAL  Final  MRSA PCR Screening     Status: None   Collection Time: 07/27/20  2:31 PM   Specimen: Nasopharyngeal  Result Value Ref Range Status   MRSA by PCR NEGATIVE NEGATIVE Final    Comment:        The GeneXpert MRSA Assay (FDA approved for NASAL specimens only), is one component of a comprehensive MRSA colonization surveillance program. It is not intended to diagnose MRSA infection nor to guide or monitor treatment for MRSA infections. Performed at [facility], [place] [address], [place], [place] [address]      Patient was seen and examined on the day of discharge and was found to be in stable condition. Time coordinating discharge: 25 minutes including assessment and coordination of care, as well as examination of the patient.   SIGNED:  Dessa Phi, DO [facility] 08/03/2020, 12:17 PM

## 2020-08-03 NOTE — Progress Notes (Signed)
Family requested for  patient not to have any more IV sticks. Dr. Maylene Roes notified per their request and updated her that patient lost his IV access and IV team  having difficulty with starting a new site. New orders received. Will hold off on IV medications at this time. Patient requesting comfortably at this time.

## 2020-08-03 NOTE — Progress Notes (Signed)
Daughter at bedside and requests the pt not have anymore IV sticks at this time due to pt becoming comfort care. States she will speak with on whether he attending MD for further guidance  a PIV is really necessary. Pts primary RN aware.

## 2020-08-03 NOTE — Progress Notes (Signed)
Report called to [facility] @ [facility] of [facility] Patient being discharged via [facility]

## 2020-08-03 NOTE — Progress Notes (Signed)
PROGRESS NOTE    Eric Larsen  NLG:[id number] DOB: [birth year] DOA: 07/24/2020 PCP: [name], MD     Brief Narrative:  Eric Larsen is an 86 y.o. 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 Three days ago, he had gone to get his Covid vaccine but when patient was trying to get out of the car, he had a fall.  Per patient's wife, patient did not lose consciousness and did not hit his head.  Since the Covid vaccination booster, he has been feeling more weak and has been finding it difficult to walk.  Over the last 6 weeks, patient also has been having mid back pain for which patient was started on Lidoderm patch.   In the ER, patient had temperature of 99 F, no neck stiffness, moving all extremities, following commands, alert, awake, generally weak, urine looks discolored but UA does not show any definite infection, Covid test was negative, EKG shows normal sinus rhythm with RBBB.  Since patient was complaining of some abdominal discomfort and CT chest abdomen pelvis was done which shows T1 and and T2 age-indeterminate compression fractures.  CT head was done which showed a small subdural hematoma.  Patient admitted for further management.  Neurosurgery reviewed patient's head CT, thought to be clinically insignificant and wanted no further work-up or treatment.  MRI thoracic spine significant for acute thoracic compression fracture at T10; neurosurgery recommended no TLSO brace secondary to patient's baseline function in addition to significant kyphosis. Due to leukocytosis, patient was started on empiric antibiotics thought to be secondary to bilateral pneumonia, possibly aspiration.  SLP evaluated patient and showed moderate to severe dysphagia.  Recommended for n.p.o.  Palliative care medicine has been involved in establishing goals of  care. Family has decided to transition patient to comfort care and hospice.   New events last 24 hours / Subjective: Daughter at bedside states that he had a rough night. Did not get haldol and had been agitated. Finally received ativan and has been sleeping since.   Assessment & Plan:   Principal Problem:   Weakness generalized Active Problems:   Primary hypothyroidism   Hypothyroidism   Dementia associated with other underlying disease without behavioral disturbance (HCC)   Subdural hematoma (HCC)   Weakness   Pressure injury of skin   Generalized weakness Bilateral community acquired pneumonia, aspiration pneumonia Subdural hematoma Acute T10 compression fracture Hypothyroidism Seizure disorder Hypernatremia Hypokalemia Blood culture contamination  Comfort care. Residential hospice pending.    Code Status: DNR Family Communication: Daughter at bedside Disposition Plan:  Status is: Inpatient  Remains inpatient appropriate because:Unsafe d/c plan   Dispo: The patient is from: ALF              Anticipated d/c is to: Residential hospice              Anticipated d/c date is: 1 day              Patient currently is medically stable to d/c. Residential hospice placement pending.    Consultants:   Neurosurgery  Palliative care medicine  Procedures:   None  Antimicrobials:  Anti-infectives (From admission, onward)   Start     Dose/Rate Route Frequency Ordered Stop   07/30/20 1200  Ampicillin-Sulbactam (UNASYN) 3 g in sodium chloride 0.9 % 100 mL IVPB  Status:  Discontinued  3 g 200 mL/hr over 30 Minutes Intravenous Every 6 hours 07/30/20 1150 07/31/20 1624   07/28/20 1800  amoxicillin-clavulanate (AUGMENTIN) 875-125 MG per tablet 1 tablet  Status:  Discontinued        1 tablet Oral Every 12 hours 07/28/20 1153 07/30/20 1150   07/26/20 2200  vancomycin (VANCOREADY) IVPB 750 mg/150 mL  Status:  Discontinued        750 mg 150 mL/hr over 60 Minutes  Intravenous Every 24 hours 07/26/20 0800 07/28/20 1133   07/26/20 0900  vancomycin (VANCOCIN) IVPB 1000 mg/200 mL premix        1,000 mg 200 mL/hr over 60 Minutes Intravenous  Once 07/26/20 0800 07/26/20 1007   07/26/20 0830  ceFEPIme (MAXIPIME) 2 g in sodium chloride 0.9 % 100 mL IVPB  Status:  Discontinued        2 g 200 mL/hr over 30 Minutes Intravenous Every 12 hours 07/26/20 0800 07/28/20 1153       Objective: Vitals:   08/01/20 0643 08/01/20 1858 08/02/20 0647 08/02/20 2058  BP: (!) 110/48 97/67 122/73 109/62  Pulse: (!) 57 64 71 85  Resp: 16 16 18    Temp: 97.7 F (36.5 C)  98.2 F (36.8 C) 98.2 F (36.8 C)  TempSrc: Oral  Oral Axillary  SpO2: 99% 93% (!) 87% 98%  Weight:      Height:        Intake/Output Summary (Last 24 hours) at 08/03/2020 1151 Last data filed at 08/03/2020 1100 Gross per 24 hour  Intake 311.3 ml  Output 650 ml  Net -338.7 ml   Filed Weights   07/24/20 2155  Weight: 53.5 kg    Examination: General exam: Appears calm and comfortable    Data Reviewed: I have personally reviewed following labs and imaging studies  CBC: Recent Labs  Lab 07/28/20 0526 07/31/20 0552  WBC 10.0 14.0*  HGB 11.6* 12.4*  HCT 35.0* 38.9*  MCV 100.6* 100.5*  PLT 193 300   Basic Metabolic Panel: Recent Labs  Lab 07/28/20 0526 07/30/20 0510 07/31/20 0552  NA  --   --  148*  K  --   --  3.3*  CL  --   --  108  CO2  --   --  28  GLUCOSE  --   --  111*  BUN  --   --  28*  CREATININE 0.58* 0.82 0.84  CALCIUM  --   --  9.0   GFR: Estimated Creatinine Clearance: 46.9 mL/min (by C-G formula based on SCr of 0.84 mg/dL). Liver Function Tests: No results for input(s): AST, ALT, ALKPHOS, BILITOT, PROT, ALBUMIN in the last 168 hours. No results for input(s): LIPASE, AMYLASE in the last 168 hours. No results for input(s): AMMONIA in the last 168 hours. Coagulation Profile: No results for input(s): INR, PROTIME in the last 168 hours. Cardiac Enzymes: No  results for input(s): CKTOTAL, CKMB, CKMBINDEX, TROPONINI in the last 168 hours. BNP (last 3 results) No results for input(s): PROBNP in the last 8760 hours. HbA1C: No results for input(s): HGBA1C in the last 72 hours. CBG: Recent Labs  Lab 07/30/20 0803  GLUCAP 105*   Lipid Profile: No results for input(s): CHOL, HDL, LDLCALC, TRIG, CHOLHDL, LDLDIRECT in the last 72 hours. Thyroid Function Tests: No results for input(s): TSH, T4TOTAL, FREET4, T3FREE, THYROIDAB in the last 72 hours. Anemia Panel: No results for input(s): VITAMINB12, FOLATE, FERRITIN, TIBC, IRON, RETICCTPCT in the last 72 hours. Sepsis Labs: Recent Labs  Lab 07/28/20 0526  PROCALCITON 0.54    Recent Results (from the past 240 hour(s))  Urine culture     Status: Abnormal   Collection Time: 07/24/20 10:13 PM   Specimen: Urine, Clean Catch  Result Value Ref Range Status   Specimen Description   Final    URINE, CLEAN CATCH Performed at [facility], [place] [address], [place], [place] [address]    Special Requests   Final    NONE Performed at [facility], [place] [address] [place], [place] [address]    Culture (A)  Final    <10,000 COLONIES/mL INSIGNIFICANT GROWTH Performed at [place] [address] [place], [place] [address]    Report Status 07/26/2020 FINAL  Final  Resp Panel by RT-PCR (Flu A&B, Covid) Nasopharyngeal Swab     Status: None   Collection Time: 07/24/20 10:13 PM   Specimen: Nasopharyngeal Swab; Nasopharyngeal(NP) swabs in vial transport medium  Result Value Ref Range Status   SARS Coronavirus 2 by RT PCR NEGATIVE NEGATIVE Final    Comment: (NOTE) SARS-CoV-2 target nucleic acids are NOT DETECTED.  The SARS-CoV-2 RNA is generally detectable in upper respiratory specimens during the acute phase of infection. The lowest concentration of SARS-CoV-2 viral copies this assay can detect is 138 copies/mL. A negative result does not preclude  SARS-Cov-2 infection and should not be used as the sole basis for treatment or other patient management decisions. A negative result may occur with  improper specimen collection/handling, submission of specimen other than nasopharyngeal swab, presence of viral mutation(s) within the areas targeted by this assay, and inadequate number of viral copies(<138 copies/mL). A negative result must be combined with clinical observations, patient history, and epidemiological information. The expected result is Negative.  Fact Sheet for Patients:  EntrepreneurPulse.com.au  Fact Sheet for Healthcare Providers:  IncredibleEmployment.be  This test is no t yet approved or cleared by the [place] FDA and  has been authorized for detection and/or diagnosis of SARS-CoV-2 by FDA under an Emergency Use Authorization (EUA). This EUA will remain  in effect (meaning this test can be used) for the duration of the COVID-19 declaration under Section 564(b)(1) of the Act, 21 U.S.C.section 360bbb-3(b)(1), unless the authorization is terminated  or revoked sooner.       Influenza A by PCR NEGATIVE NEGATIVE Final   Influenza B by PCR NEGATIVE NEGATIVE Final    Comment: (NOTE) The Xpert Xpress SARS-CoV-2/FLU/RSV plus assay is intended as an aid in the diagnosis of influenza from Nasopharyngeal swab specimens and should not be used as a sole basis for treatment. Nasal washings and aspirates are unacceptable for Xpert Xpress SARS-CoV-2/FLU/RSV testing.  Fact Sheet for Patients: EntrepreneurPulse.com.au  Fact Sheet for Healthcare Providers: IncredibleEmployment.be  This test is not yet approved or cleared by the [place] FDA and has been authorized for detection and/or diagnosis of SARS-CoV-2 by FDA under an Emergency Use Authorization (EUA). This EUA will remain in effect (meaning this test can be used) for the duration of  the COVID-19 declaration under Section 564(b)(1) of the Act, 21 U.S.C. section 360bbb-3(b)(1), unless the authorization is terminated or revoked.  Performed at [facility], [place] [address], [place], [place] [address]   Culture, blood (routine x 2)     Status: Abnormal   Collection Time: 07/25/20  7:40 AM   Specimen: BLOOD  Result Value Ref Range Status   Specimen Description   Final    BLOOD LEFT ARM  Performed at [facility], [place] [address], [place], [place] [address]    Special Requests   Final    BOTTLES DRAWN AEROBIC AND ANAEROBIC Blood Culture adequate volume Performed at [place] [address], [place], [place] [address]    Culture  Setup Time   Final    GRAM POSITIVE RODS AEROBIC BOTTLE ONLY CRITICAL RESULT CALLED TO, READ BACK BY AND VERIFIED WITH: Shelda Jakes PHARMD, AT 1057 07/30/20 BY D. VANHOOK    Culture (A)  Final    CORYNEBACTERIUM UREALYTICUM Standardized susceptibility testing for this organism is not available. Performed at [facility], [place] [address], [place], [place] [address]    Report Status 07/31/2020 FINAL  Final  Culture, blood (routine x 2)     Status: None   Collection Time: 07/25/20  7:47 AM   Specimen: BLOOD  Result Value Ref Range Status   Specimen Description   Final    BLOOD RIGHT ARM Performed at [place] [address] [place], [place] [address]    Special Requests   Final    BOTTLES DRAWN AEROBIC AND ANAEROBIC Blood Culture results may not be optimal due to an inadequate volume of blood received in culture bottles Performed at [place] [address], [place], [place] [address]    Culture   Final    NO GROWTH 5 DAYS Performed at [facility], [place] [address], [place], [place] [address]    Report Status 07/30/2020 FINAL  Final  MRSA PCR Screening     Status: None   Collection Time: 07/27/20  2:31 PM   Specimen:  Nasopharyngeal  Result Value Ref Range Status   MRSA by PCR NEGATIVE NEGATIVE Final    Comment:        The GeneXpert MRSA Assay (FDA approved for NASAL specimens only), is one component of a comprehensive MRSA colonization surveillance program. It is not intended to diagnose MRSA infection nor to guide or monitor treatment for MRSA infections. Performed at [facility], [place] [address] [place], [place] [address]       Radiology Studies: No results found.    Scheduled Meds:  Continuous Infusions:    LOS: 9 days      Time spent: 20 minutes.   Dessa Phi, DO [facility] 08/03/2020, 11:51 AM   Available via Epic secure chat 7am-7pm After these hours, please refer to coverage provider listed on [email or web address]

## 2020-08-03 NOTE — TOC Transition Note (Signed)
Transition of Care [facility]) - CM/SW Discharge Note   Patient Details  Name: Eric Larsen MRN: [id number] Date of Birth: 1930/11/21  [facility] CM/SW Contact:  Lennart Pall, LCSW Phone Number: 08/03/2020, 12:34 PM   Clinical Narrative:    Bed ready for pt at [facility] today.  MD and family aware and agreeable with transfer via [facility].  Transport called.  RN to call report to [phone].  No further TOC needs.   Final next level of care: [place] Barriers to Discharge: Barriers Resolved   Patient Goals and CMS Choice Patient states their goals for this hospitalization and ongoing recovery are:: spouse is deciding on best options      Discharge Placement              Patient chooses bed at: Other - please specify in the comment section below: ([facility]) Patient to be transferred to facility by: [facility] Name of family member notified: wife and daughters Patient and family notified of of transfer: 08/03/20  Discharge Plan and Services   Discharge Planning Services: CM Consult            DME Arranged: N/A DME Agency: NA                  Social Determinants of Health ([place]) Interventions     Readmission Risk Interventions No flowsheet data found.

## 2020-08-17 DEATH — deceased
